# Patient Record
Sex: Male | Born: 1955
Health system: Southern US, Community
[De-identification: ages and names within clinical notes are randomized; demographics above are authoritative.]

## PROBLEM LIST (undated history)

## (undated) DIAGNOSIS — I10 Essential (primary) hypertension: Secondary | ICD-10-CM

## (undated) DIAGNOSIS — F32A Depression, unspecified: Secondary | ICD-10-CM

## (undated) DIAGNOSIS — M797 Fibromyalgia: Secondary | ICD-10-CM

## (undated) DIAGNOSIS — N401 Enlarged prostate with lower urinary tract symptoms: Secondary | ICD-10-CM

## (undated) DIAGNOSIS — E119 Type 2 diabetes mellitus without complications: Secondary | ICD-10-CM

## (undated) DIAGNOSIS — F329 Major depressive disorder, single episode, unspecified: Secondary | ICD-10-CM

## (undated) DIAGNOSIS — E669 Obesity, unspecified: Secondary | ICD-10-CM

## (undated) DIAGNOSIS — E785 Hyperlipidemia, unspecified: Secondary | ICD-10-CM

## (undated) DIAGNOSIS — J449 Chronic obstructive pulmonary disease, unspecified: Secondary | ICD-10-CM

## (undated) DIAGNOSIS — R3915 Urgency of urination: Secondary | ICD-10-CM

## (undated) DIAGNOSIS — M1990 Unspecified osteoarthritis, unspecified site: Secondary | ICD-10-CM

## (undated) HISTORY — DX: Essential (primary) hypertension: I10

## (undated) HISTORY — DX: Obesity, unspecified: E66.9

## (undated) HISTORY — DX: Chronic obstructive pulmonary disease, unspecified: J44.9

## (undated) HISTORY — DX: Unspecified osteoarthritis, unspecified site: M19.90

## (undated) HISTORY — DX: Major depressive disorder, single episode, unspecified: F32.9

## (undated) HISTORY — DX: Benign prostatic hyperplasia with lower urinary tract symptoms: N40.1

## (undated) HISTORY — DX: Hyperlipidemia, unspecified: E78.5

## (undated) HISTORY — DX: Depression, unspecified: F32.A

## (undated) HISTORY — DX: Urgency of urination: R39.15

---

## 2007-09-17 ENCOUNTER — Emergency Department: Payer: Self-pay | Admitting: Unknown Physician Specialty

## 2008-02-27 ENCOUNTER — Other Ambulatory Visit: Payer: Self-pay

## 2008-02-27 ENCOUNTER — Emergency Department: Payer: Self-pay | Admitting: Emergency Medicine

## 2008-07-28 ENCOUNTER — Emergency Department: Payer: Self-pay | Admitting: Emergency Medicine

## 2010-07-28 ENCOUNTER — Ambulatory Visit: Payer: Self-pay

## 2011-10-18 ENCOUNTER — Emergency Department: Payer: Self-pay | Admitting: Internal Medicine

## 2012-08-16 ENCOUNTER — Encounter (HOSPITAL_COMMUNITY): Payer: Self-pay | Admitting: Emergency Medicine

## 2012-08-16 ENCOUNTER — Emergency Department (HOSPITAL_COMMUNITY)
Admission: EM | Admit: 2012-08-16 | Discharge: 2012-08-16 | Disposition: A | Discharge status: Home / Self Care | Payer: PRIVATE HEALTH INSURANCE | Attending: Emergency Medicine | Admitting: Emergency Medicine

## 2012-08-16 ENCOUNTER — Emergency Department (HOSPITAL_COMMUNITY): Payer: PRIVATE HEALTH INSURANCE

## 2012-08-16 DIAGNOSIS — Z79899 Other long term (current) drug therapy: Secondary | ICD-10-CM | POA: Insufficient documentation

## 2012-08-16 DIAGNOSIS — Y990 Civilian activity done for income or pay: Secondary | ICD-10-CM | POA: Insufficient documentation

## 2012-08-16 DIAGNOSIS — Z7982 Long term (current) use of aspirin: Secondary | ICD-10-CM | POA: Insufficient documentation

## 2012-08-16 DIAGNOSIS — E119 Type 2 diabetes mellitus without complications: Secondary | ICD-10-CM | POA: Insufficient documentation

## 2012-08-16 DIAGNOSIS — R296 Repeated falls: Secondary | ICD-10-CM | POA: Insufficient documentation

## 2012-08-16 DIAGNOSIS — Y9210 Unspecified residential institution as the place of occurrence of the external cause: Secondary | ICD-10-CM | POA: Insufficient documentation

## 2012-08-16 DIAGNOSIS — N429 Disorder of prostate, unspecified: Secondary | ICD-10-CM | POA: Insufficient documentation

## 2012-08-16 DIAGNOSIS — S8002XA Contusion of left knee, initial encounter: Secondary | ICD-10-CM

## 2012-08-16 DIAGNOSIS — F17200 Nicotine dependence, unspecified, uncomplicated: Secondary | ICD-10-CM | POA: Insufficient documentation

## 2012-08-16 DIAGNOSIS — Z8739 Personal history of other diseases of the musculoskeletal system and connective tissue: Secondary | ICD-10-CM | POA: Insufficient documentation

## 2012-08-16 DIAGNOSIS — S8000XA Contusion of unspecified knee, initial encounter: Secondary | ICD-10-CM | POA: Insufficient documentation

## 2012-08-16 DIAGNOSIS — Y9389 Activity, other specified: Secondary | ICD-10-CM | POA: Insufficient documentation

## 2012-08-16 DIAGNOSIS — I10 Essential (primary) hypertension: Secondary | ICD-10-CM | POA: Insufficient documentation

## 2012-08-16 HISTORY — DX: Essential (primary) hypertension: I10

## 2012-08-16 HISTORY — DX: Type 2 diabetes mellitus without complications: E11.9

## 2012-08-16 HISTORY — DX: Fibromyalgia: M79.7

## 2012-08-16 MED ORDER — IBUPROFEN 800 MG PO TABS
800.0000 mg | ORAL_TABLET | Freq: Once | ORAL | Status: AC
  Administered 2012-08-16: 800 mg via ORAL
  Filled 2012-08-16: qty 1

## 2012-08-16 MED ORDER — HYDROCODONE-ACETAMINOPHEN 5-325 MG PO TABS: 1.0000 | ORAL_TABLET | ORAL | Status: DC | PRN

## 2012-08-16 MED ORDER — IBUPROFEN 800 MG PO TABS: 800.0000 mg | ORAL_TABLET | Freq: Three times a day (TID) | ORAL | Status: DC

## 2012-08-16 MED ORDER — HYDROCODONE-ACETAMINOPHEN 5-325 MG PO TABS
1.0000 | ORAL_TABLET | Freq: Once | ORAL | Status: DC
  Filled 2012-08-16: qty 1

## 2012-08-16 NOTE — ED Provider Notes (Signed)
History     CSN: [id number]  Arrival date & time 08/16/12  0424   First MD Initiated Contact with Patient 08/16/12 0455      Chief Complaint  Patient presents with  . Knee Injury    (Consider location/radiation/quality/duration/timing/severity/associated sxs/prior treatment) HPI Jonathon Graham is a 57 y.o. male , with a h/o DM, fibromyalgia, HTN who presents to the Emergency Department complaining of left knee pain secondary to a fall taken the night of 08/15/12 in the ER onto his knees. He was given an ice pack which helped until tonight when with walking he developed more pain.  Pain with walking and with bending the knee. Past Medical History  Diagnosis Date  . Hypertension   . Fibromyalgia, primary   . Diabetes mellitus without complication   . Prostate disorder     History reviewed. No pertinent past surgical history.  History reviewed. No pertinent family history.  History  Substance Use Topics  . Smoking status: Current Every Day Smoker  . Smokeless tobacco: Not on file  . Alcohol Use: No      Review of Systems  Constitutional: Negative for fever.       10 Systems reviewed and are negative for acute change except as noted in the HPI.  HENT: Negative for congestion.   Eyes: Negative for discharge and redness.  Respiratory: Negative for cough and shortness of breath.   Cardiovascular: Negative for chest pain.  Gastrointestinal: Negative for vomiting and abdominal pain.  Musculoskeletal: Negative for back pain.       Left Knee pain  Skin: Negative for rash.  Neurological: Negative for syncope, numbness and headaches.  Psychiatric/Behavioral:       No behavior change.    Allergies  Penicillins  Home Medications   Current Outpatient Rx  Name  Route  Sig  Dispense  Refill  . aspirin 81 MG tablet   Oral   Take 81 mg by mouth daily. Takes 2 - 81mg  aspirin daily.         Marland Kitchen doxazosin (CARDURA) 8 MG tablet   Oral   Take 8 mg by mouth at bedtime.          . hydrochlorothiazide (HYDRODIURIL) 25 MG tablet   Oral   Take 25 mg by mouth daily.         . metFORMIN (GLUCOPHAGE) 500 MG tablet   Oral   Take 500 mg by mouth 2 (two) times daily with a meal.           BP 124/100  Pulse 76  Temp(Src) 97.6 F (36.4 C) (Oral)  Resp 20  SpO2 92%  Physical Exam  Nursing note and vitals reviewed. Constitutional:  Awake, alert, nontoxic appearance.  HENT:  Head: Atraumatic.  Eyes: Right eye exhibits no discharge. Left eye exhibits no discharge.  Neck: Neck supple.  Pulmonary/Chest: Effort normal. He exhibits no tenderness.  Abdominal: Soft. There is no tenderness. There is no rebound.  Musculoskeletal: He exhibits no tenderness.  Baseline ROM, no obvious new focal weakness.Mild swelling to the left knee. Painful along the medial aspect of the joint line to palpation. No crepitus, no patellar ballottement.   Neurological:  Mental status and motor strength appears baseline for patient and situation.  Skin: No rash noted.  Psychiatric: He has a normal mood and affect.    ED Course  Procedures (including critical care time)  Dg Knee Complete 4 Views Left  08/16/2012  *RADIOLOGY REPORT*  Clinical Data: Knee pain after  fall on 08/15/2011.  LEFT KNEE - COMPLETE 4+ VIEW  Comparison: None.  Findings: Mild soft tissue swelling over the infrapatellar ligament.  Left knee appears otherwise intact.  No evidence of acute fracture or subluxation.  No significant effusion.  No focal bone lesion or bone destruction.  Bone cortex and trabecular architecture appear intact.  No abnormal radiopaque soft tissue foreign bodies.  IMPRESSION: Soft tissue swelling over the infrapatellar ligament.  This could be due to hematoma or bursitis.  No acute bony abnormalities.   Original Report Authenticated By: Burman Nieves, M.D.     MDM  Patient with knee swelling and pain s/p fall. Xrays without acute findings except soft tissue swelling. ACE wrap applied.  Ibuprofen and hydrocodone given. Reviewed results with patient. Pt stable in ED with no significant deterioration in condition.The patient appears reasonably screened and/or stabilized for discharge and I doubt any other medical condition or other [facility] requiring further screening, evaluation, or treatment in the ED at this time prior to discharge.  MDM Reviewed: nursing note and vitals Interpretation: x-ray           Nicoletta Dress. Colon Branch, MD 08/16/12 [phone]

## 2012-08-16 NOTE — ED Notes (Signed)
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 Complaining of pain and swelling in left knee.

## 2013-04-25 ENCOUNTER — Encounter: Payer: Self-pay | Admitting: Internal Medicine

## 2013-04-25 ENCOUNTER — Ambulatory Visit: Payer: Self-pay | Admitting: Family Medicine

## 2013-06-25 ENCOUNTER — Emergency Department: Payer: Self-pay | Admitting: Emergency Medicine

## 2013-06-25 LAB — CBC
HCT: 41.5 % (ref 40.0–52.0)
HGB: 13.8 g/dL (ref 13.0–18.0)
MCH: 28.9 pg (ref 26.0–34.0)
MCHC: 33.2 g/dL (ref 32.0–36.0)
MCV: 87 fL (ref 80–100)
Platelet: 187 10*3/uL (ref 150–440)
RBC: 4.76 10*6/uL (ref 4.40–5.90)
RDW: 15.9 % — ABNORMAL HIGH (ref 11.5–14.5)
WBC: 6.3 10*3/uL (ref 3.8–10.6)

## 2013-06-25 LAB — BASIC METABOLIC PANEL
Anion Gap: 4 — ABNORMAL LOW (ref 7–16)
BUN: 18 mg/dL (ref 7–18)
Calcium, Total: 9.1 mg/dL (ref 8.5–10.1)
Chloride: 103 mmol/L (ref 98–107)
Co2: 29 mmol/L (ref 21–32)
Creatinine: 0.83 mg/dL (ref 0.60–1.30)
EGFR (African American): >60
EGFR (Non-African Amer.): >60
Glucose: 117 mg/dL — ABNORMAL HIGH (ref 65–99)
Osmolality: 275 (ref 275–301)
Potassium: 3.7 mmol/L (ref 3.5–5.1)
Sodium: 136 mmol/L (ref 136–145)

## 2013-06-25 LAB — TROPONIN I: Troponin-I: <0.02 ng/mL

## 2013-06-25 LAB — PRO B NATRIURETIC PEPTIDE: B-Type Natriuretic Peptide: 69 pg/mL (ref 0–125)

## 2013-07-03 ENCOUNTER — Emergency Department: Payer: Self-pay | Admitting: Emergency Medicine

## 2013-07-03 LAB — COMPREHENSIVE METABOLIC PANEL
Albumin: 3.7 g/dL (ref 3.4–5.0)
Alkaline Phosphatase: 51 U/L
Anion Gap: 4 — ABNORMAL LOW (ref 7–16)
BUN: 18 mg/dL (ref 7–18)
Bilirubin,Total: 0.9 mg/dL (ref 0.2–1.0)
Calcium, Total: 8.8 mg/dL (ref 8.5–10.1)
Chloride: 99 mmol/L (ref 98–107)
Co2: 33 mmol/L — ABNORMAL HIGH (ref 21–32)
Creatinine: 0.81 mg/dL (ref 0.60–1.30)
EGFR (African American): >60
EGFR (Non-African Amer.): >60
Glucose: 128 mg/dL — ABNORMAL HIGH (ref 65–99)
Osmolality: 275 (ref 275–301)
Potassium: 4 mmol/L (ref 3.5–5.1)
SGOT(AST): 30 U/L (ref 15–37)
SGPT (ALT): 48 U/L (ref 12–78)
Sodium: 136 mmol/L (ref 136–145)
Total Protein: 8 g/dL (ref 6.4–8.2)

## 2013-07-03 LAB — CBC WITH DIFFERENTIAL/PLATELET
Basophil #: 0 10*3/uL (ref 0.0–0.1)
Basophil %: 0.4 %
Eosinophil #: 0.1 10*3/uL (ref 0.0–0.7)
Eosinophil %: 1.8 %
HCT: 42.3 % (ref 40.0–52.0)
HGB: 14.3 g/dL (ref 13.0–18.0)
Lymphocyte #: 2 10*3/uL (ref 1.0–3.6)
Lymphocyte %: 27.8 %
MCH: 28.8 pg (ref 26.0–34.0)
MCHC: 33.7 g/dL (ref 32.0–36.0)
MCV: 85 fL (ref 80–100)
Monocyte #: 1.1 x10 3/mm — ABNORMAL HIGH (ref 0.2–1.0)
Monocyte %: 15.7 %
Neutrophil #: 3.9 10*3/uL (ref 1.4–6.5)
Neutrophil %: 54.3 %
Platelet: 170 10*3/uL (ref 150–440)
RBC: 4.95 10*6/uL (ref 4.40–5.90)
RDW: 15.7 % — ABNORMAL HIGH (ref 11.5–14.5)
WBC: 7.2 10*3/uL (ref 3.8–10.6)

## 2013-08-19 ENCOUNTER — Other Ambulatory Visit: Payer: Self-pay | Admitting: Family Medicine

## 2013-08-19 LAB — CBC WITH DIFFERENTIAL/PLATELET
Basophil #: 0 10*3/uL (ref 0.0–0.1)
Basophil %: 0.2 %
Eosinophil #: 0.1 10*3/uL (ref 0.0–0.7)
Eosinophil %: 2.2 %
HCT: 43.7 % (ref 40.0–52.0)
HGB: 14.4 g/dL (ref 13.0–18.0)
Lymphocyte #: 1.5 10*3/uL (ref 1.0–3.6)
Lymphocyte %: 24.3 %
MCH: 28.6 pg (ref 26.0–34.0)
MCHC: 32.9 g/dL (ref 32.0–36.0)
MCV: 87 fL (ref 80–100)
Monocyte #: 0.5 x10 3/mm (ref 0.2–1.0)
Monocyte %: 8.2 %
Neutrophil #: 4.1 10*3/uL (ref 1.4–6.5)
Neutrophil %: 65.1 %
Platelet: 196 10*3/uL (ref 150–440)
RBC: 5.03 10*6/uL (ref 4.40–5.90)
RDW: 16.1 % — ABNORMAL HIGH (ref 11.5–14.5)
WBC: 6.3 10*3/uL (ref 3.8–10.6)

## 2013-08-19 LAB — PROTEIN, URINE, RANDOM: Protein, Random Urine: 15 mg/dL — ABNORMAL HIGH (ref 0–12)

## 2013-08-19 LAB — PROTEIN, TOTAL: Total Protein: 7.6 g/dL (ref 6.4–8.2)

## 2013-12-17 ENCOUNTER — Other Ambulatory Visit: Payer: Self-pay | Admitting: Physical Medicine and Rehabilitation

## 2013-12-17 DIAGNOSIS — M542 Cervicalgia: Secondary | ICD-10-CM

## 2013-12-17 DIAGNOSIS — G894 Chronic pain syndrome: Secondary | ICD-10-CM

## 2013-12-17 DIAGNOSIS — M5412 Radiculopathy, cervical region: Secondary | ICD-10-CM

## 2013-12-17 DIAGNOSIS — M5030 Other cervical disc degeneration, unspecified cervical region: Secondary | ICD-10-CM

## 2013-12-17 DIAGNOSIS — M4802 Spinal stenosis, cervical region: Secondary | ICD-10-CM

## 2013-12-23 ENCOUNTER — Other Ambulatory Visit: Payer: Self-pay | Admitting: Physical Medicine and Rehabilitation

## 2013-12-24 ENCOUNTER — Other Ambulatory Visit: Payer: Self-pay | Admitting: Physical Medicine and Rehabilitation

## 2013-12-24 DIAGNOSIS — G894 Chronic pain syndrome: Secondary | ICD-10-CM

## 2013-12-24 DIAGNOSIS — M542 Cervicalgia: Secondary | ICD-10-CM

## 2013-12-24 DIAGNOSIS — M4802 Spinal stenosis, cervical region: Secondary | ICD-10-CM

## 2013-12-24 DIAGNOSIS — M5030 Other cervical disc degeneration, unspecified cervical region: Secondary | ICD-10-CM

## 2013-12-24 DIAGNOSIS — M5412 Radiculopathy, cervical region: Secondary | ICD-10-CM

## 2013-12-26 ENCOUNTER — Other Ambulatory Visit: Payer: Self-pay

## 2014-08-13 ENCOUNTER — Ambulatory Visit: Payer: Self-pay | Admitting: Family Medicine

## 2015-01-06 ENCOUNTER — Telehealth: Payer: Self-pay | Admitting: Family Medicine

## 2015-01-06 NOTE — Telephone Encounter (Signed)
Pt needs refills on metformin 1000 mg and tamsulosin 4 mg sent to [facility] [address]  He has appt scheduled for Monday 15th.   His number is [phone]

## 2015-01-07 MED ORDER — TAMSULOSIN HCL 0.4 MG PO CAPS: 0.4000 mg | ORAL_CAPSULE | Freq: Every day | ORAL | Status: DC

## 2015-01-07 MED ORDER — METFORMIN HCL 1000 MG PO TABS
1000.0000 mg | ORAL_TABLET | Freq: Two times a day (BID) | ORAL | Status: DC
Start: 2015-01-07 — End: 2015-11-26

## 2015-01-07 NOTE — Telephone Encounter (Signed)
Refills sent to [facility] per patient request.

## 2015-01-11 ENCOUNTER — Ambulatory Visit: Payer: Self-pay | Admitting: Family Medicine

## 2015-01-13 ENCOUNTER — Ambulatory Visit: Payer: Self-pay | Admitting: Family Medicine

## 2015-01-28 ENCOUNTER — Encounter: Payer: Self-pay | Admitting: *Deleted

## 2015-01-29 ENCOUNTER — Ambulatory Visit: Payer: BC Managed Care – PPO | Admitting: Family Medicine

## 2015-02-05 ENCOUNTER — Encounter: Payer: Self-pay | Admitting: Emergency Medicine

## 2015-02-05 ENCOUNTER — Emergency Department: Payer: Self-pay

## 2015-02-05 ENCOUNTER — Emergency Department
Admission: EM | Admit: 2015-02-05 | Discharge: 2015-02-05 | Disposition: A | Discharge status: Home / Self Care | Payer: Self-pay | Attending: Emergency Medicine | Admitting: Emergency Medicine

## 2015-02-05 DIAGNOSIS — Z720 Tobacco use: Secondary | ICD-10-CM | POA: Insufficient documentation

## 2015-02-05 DIAGNOSIS — Z79899 Other long term (current) drug therapy: Secondary | ICD-10-CM | POA: Insufficient documentation

## 2015-02-05 DIAGNOSIS — Z880 Allergy status to penicillin: Secondary | ICD-10-CM | POA: Insufficient documentation

## 2015-02-05 DIAGNOSIS — E119 Type 2 diabetes mellitus without complications: Secondary | ICD-10-CM | POA: Insufficient documentation

## 2015-02-05 DIAGNOSIS — Z7982 Long term (current) use of aspirin: Secondary | ICD-10-CM | POA: Insufficient documentation

## 2015-02-05 DIAGNOSIS — I10 Essential (primary) hypertension: Secondary | ICD-10-CM | POA: Insufficient documentation

## 2015-02-05 DIAGNOSIS — M545 Low back pain, unspecified: Secondary | ICD-10-CM

## 2015-02-05 LAB — URINALYSIS COMPLETE WITH MICROSCOPIC (ARMC ONLY)
Bacteria, UA: NONE SEEN
Bilirubin Urine: NEGATIVE
Glucose, UA: NEGATIVE mg/dL
Hgb urine dipstick: NEGATIVE
Ketones, ur: NEGATIVE mg/dL
Leukocytes, UA: NEGATIVE
Nitrite: NEGATIVE
Protein, ur: NEGATIVE mg/dL
Specific Gravity, Urine: 1.026 (ref 1.005–1.030)
pH: 6 (ref 5.0–8.0)

## 2015-02-05 MED ORDER — ETODOLAC 400 MG PO TABS: 400.0000 mg | ORAL_TABLET | Freq: Two times a day (BID) | ORAL | Status: DC

## 2015-02-05 NOTE — ED Notes (Signed)
Developed some lower back pains few days ago. Noticed that pain became worse after making a delivery .states pain is lower back and slightly into bilateral hip areas. Increased pain with standing

## 2015-02-05 NOTE — ED Notes (Signed)
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 Pt reports pain across his back non radiating

## 2015-02-05 NOTE — ED Provider Notes (Signed)
[facility] Emergency Department Provider Note  ____________________________________________  Time seen: Approximately 10:12 AM  I have reviewed the triage vital signs and the nursing notes.   HISTORY  Chief Complaint Back Pain   HPI Jonathon Graham is a 59 y.o. male is here with complaint of back pain for 2 days. 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 He states that he did not have any pain initially but once he got home began having pain and stiffness. He also has not taken any medication over-the-counter for his back pain. He states pain is increased with standing. pain is nonradiating in nature.Currently his pain is 10 over 10. He denies any urinary frequency however his wife states the areas. He denies any history of UTIs or kidney stones. He denies any loss of bowel or bladder control. Pain is increased with range of motion.   Past Medical History  Diagnosis Date  . Hypertension   . Fibromyalgia, primary   . Diabetes mellitus without complication   . Prostate disorder   . Benign prostatic hyperplasia (BPH) with urinary urgency     nodular  . Depression   . Arthritis   . Essential hypertension     There are no active problems to display for this patient.   History reviewed. No pertinent past surgical history.  Current Outpatient Rx  Name  Route  Sig  Dispense  Refill  . aspirin 81 MG tablet   Oral   Take 81 mg by mouth daily. Takes 2 - 81mg  aspirin daily.         Marland Kitchen doxazosin (CARDURA) 8 MG tablet   Oral   Take 8 mg by mouth at bedtime.         Marland Kitchen etodolac (LODINE) 400 MG tablet   Oral   Take 1 tablet (400 mg total) by mouth 2 (two) times daily.   20 tablet   0   . hydrochlorothiazide (HYDRODIURIL) 25 MG tablet   Oral   Take 25 mg by mouth daily.         Marland Kitchen HYDROcodone-acetaminophen (NORCO/VICODIN) 5-325 MG per tablet   Oral   Take 1 tablet by mouth every 4 (four) hours as needed for pain.   15 tablet   0    . ibuprofen (ADVIL,MOTRIN) 800 MG tablet   Oral   Take 1 tablet (800 mg total) by mouth 3 (three) times daily.   21 tablet   0   . metFORMIN (GLUCOPHAGE) 1000 MG tablet   Oral   Take 1 tablet (1,000 mg total) by mouth 2 (two) times daily with a meal.   180 tablet   3   . tamsulosin (FLOMAX) 0.4 MG CAPS capsule   Oral   Take 1 capsule (0.4 mg total) by mouth daily.   90 capsule   3     Allergies Penicillins  Family History  Problem Relation Age of Onset  . Heart disease Father   . Diabetes Mellitus II Sister   . Myasthenia gravis Sister   . Diabetes Mellitus II Brother     Social History Social History  Substance Use Topics  . Smoking status: Current Every Day Smoker  . Smokeless tobacco: None  . Alcohol Use: No    Review of Systems Constitutional: No fever/chills Eyes: No visual changes. ENT: No sore throat. Cardiovascular: Denies chest pain. Respiratory: Denies shortness of breath. Gastrointestinal: No abdominal pain.  No nausea, no vomiting.  No diarrhea.  No constipation. Genitourinary: Negative  for dysuria. Musculoskeletal: Positive for back pain. Skin: Negative for rash. Neurological: Negative for headaches, focal weakness or numbness.  10-point ROS otherwise negative.  ____________________________________________   PHYSICAL EXAM:  VITAL SIGNS: ED Triage Vitals  Enc Vitals Group     BP 02/05/15 0929 143/79 mmHg     Pulse Rate 02/05/15 0929 69     Resp 02/05/15 0929 18     Temp 02/05/15 0929 98.5 F (36.9 C)     Temp src --      SpO2 02/05/15 0929 92 %     Weight 02/05/15 0943 330 lb (149.687 kg)     Height 02/05/15 0943  (1.702 m)     Head Cir --      Peak Flow --      Pain Score 02/05/15 0930 10     Pain Loc --      Pain Edu? --      Excl. in GC? --     Constitutional: Alert and oriented. Well appearing and in no acute distress. Patient  is morbidly obese Eyes: Conjunctivae are normal. PERRL. EOMI. Head: Atraumatic. Nose:  No congestion/rhinnorhea. Mouth/Throat: Mucous membranes are moist.  Oropharynx non-erythematous. Neck: No stridor.   Hematological/Lymphatic/Immunilogical: No cervical lymphadenopathy. Cardiovascular: Normal rate, regular rhythm. Grossly normal heart sounds.  Good peripheral circulation. Respiratory: Normal respiratory effort.  No retractions. Lungs CTAB. Gastrointestinal: Soft and nontender,  obese No distention. No abdominal bruits. No CVA tenderness. Musculoskeletal: Examination of the back there is no gross deformity. There is no tenderness on palpation of the lumbosacral spine but more bilateral paravertebral muscles. Range of motion is slightly restricted secondary to pain. No active muscle spasms were seen. Patient has good strength bilaterally in his legs. Straight leg raises were approximately 45 with discomfort in his lower back. No lower extremity tenderness nor edema.  No joint effusions. Neurologic:  Normal speech and language. No gross focal neurologic deficits are appreciated. No gait instability. Reflexes were 1+ bilaterally Skin:  Skin is warm, dry and intact. No rash noted. Psychiatric: Mood and affect are normal. Speech and behavior are normal.  ____________________________________________   LABS (all labs ordered are listed, but only abnormal results are displayed)  Labs Reviewed  URINALYSIS COMPLETEWITH MICROSCOPIC ([facility] ONLY) - Abnormal; Notable for the following:    Color, Urine YELLOW (*)    APPearance CLEAR (*)    Squamous Epithelial / LPF 0-5 (*)    All other components within normal limits     RADIOLOGY  Lumbar spine x-ray per radiologist no acute fracture. There is a 1 mm anterolisthesis of L4 on L5 secondary to bilateral facet arthropathy.  L5-S1 bilateral facet arthropathy  ____________________________________________   PROCEDURES  Procedure(s) performed: None  Critical Care performed:  No  ____________________________________________   INITIAL IMPRESSION / ASSESSMENT AND PLAN / ED COURSE  Pertinent labs & imaging results that were available during my care of the patient were reviewed by me and considered in my medical decision making (see chart for details).  Patient was informed that urinalysis was completely normal and the results of his back x-ray. Patient refused Toradol injection while in the emergency room. 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 Prescription was written for etodolac 400 mg twice a day with food. He is to follow-up with his primary care doctor if any continued problems or Dr. Martha Clan ____________________________________________   FINAL CLINICAL IMPRESSION(S) / ED DIAGNOSES  Final diagnoses:  Bilateral  low back pain without sciatica      Tommi Rumps, PA-C 02/05/15 1214  Sharyn Creamer, MD 02/06/15 [phone]

## 2015-03-16 ENCOUNTER — Telehealth: Payer: Self-pay

## 2015-03-16 ENCOUNTER — Ambulatory Visit
Admission: RE | Admit: 2015-03-16 | Discharge: 2015-03-16 | Disposition: A | Discharge status: Home / Self Care | Payer: BC Managed Care – PPO | Source: Ambulatory Visit | Attending: Family Medicine | Admitting: Family Medicine

## 2015-03-16 ENCOUNTER — Ambulatory Visit (INDEPENDENT_AMBULATORY_CARE_PROVIDER_SITE_OTHER): Payer: BC Managed Care – PPO | Admitting: Family Medicine

## 2015-03-16 ENCOUNTER — Encounter: Payer: Self-pay | Admitting: Family Medicine

## 2015-03-16 VITALS — BP 113/74 | HR 87 | Temp 98.7°F | Resp 16 | Ht 67.0 in | Wt 347.0 lb

## 2015-03-16 DIAGNOSIS — M79604 Pain in right leg: Secondary | ICD-10-CM

## 2015-03-16 DIAGNOSIS — R600 Localized edema: Secondary | ICD-10-CM

## 2015-03-16 DIAGNOSIS — M79601 Pain in right arm: Secondary | ICD-10-CM | POA: Diagnosis not present

## 2015-03-16 DIAGNOSIS — M79605 Pain in left leg: Secondary | ICD-10-CM | POA: Insufficient documentation

## 2015-03-16 DIAGNOSIS — M7989 Other specified soft tissue disorders: Secondary | ICD-10-CM | POA: Diagnosis present

## 2015-03-16 DIAGNOSIS — M25551 Pain in right hip: Secondary | ICD-10-CM | POA: Insufficient documentation

## 2015-03-16 DIAGNOSIS — M79606 Pain in leg, unspecified: Secondary | ICD-10-CM | POA: Insufficient documentation

## 2015-03-16 DIAGNOSIS — M25552 Pain in left hip: Secondary | ICD-10-CM | POA: Insufficient documentation

## 2015-03-16 NOTE — Addendum Note (Signed)
Addended by: Venora MaplesHAWKINS, Sindy Mccune on: 03/16/2015 12:01 PM   Modules accepted: Orders

## 2015-03-16 NOTE — Progress Notes (Signed)
Name: Jonathon Graham   MRN: [id number]    DOB: [birth year]   Date:03/16/2015       Progress Note  Subjective  Chief Complaint  Chief Complaint  Patient presents with  . Back Pain    went to ER seen orthopedic diagnosed with arthritis also retaining water in both ankle onset 3 days ago     HPI  Here c/o swelling in legs ands feet.  Present for 4 days.  Painful.  Goes down at night.  Worse with standing.  Has had vascular evaluation aout 1 yr. Ago.  No clot.  He had cellulitgs at that time.   Went to ER 2-3 weeks ago due to pain in low back.  "couldn't move"  Dx'd with arthritis of back.  PT recommended ut he declined due to cost.  Saw Dr. Neomia GlassKazinski at [facility].  Treated with muscle relaxants.  Back is better now, but has some pain on and off.   Has not seen Amy since January for his DM follow up. No problem-specific assessment & plan notes found for this encounter.   Past Medical History  Diagnosis Date  . Hypertension   . Fibromyalgia, primary   . Diabetes mellitus without complication (HCC)   . Prostate disorder   . Benign prostatic hyperplasia (BPH) with urinary urgency     nodular  . Depression   . Arthritis   . Essential hypertension     Social History  Substance Use Topics  . Smoking status: Current Every Day Smoker    Types: Cigarettes  . Smokeless tobacco: Not on file     Comment: 2 packs a week  . Alcohol Use: No     Current outpatient prescriptions:  .  aspirin 81 MG tablet, Take 81 mg by mouth daily. Takes 2 - 81mg  aspirin daily., Disp: , Rfl:  .  hydrochlorothiazide (HYDRODIURIL) 25 MG tablet, Take 25 mg by mouth daily., Disp: , Rfl:  .  metFORMIN (GLUCOPHAGE) 1000 MG tablet, Take 1 tablet (1,000 mg total) by mouth 2 (two) times daily with a meal., Disp: 180 tablet, Rfl: 3 .  tamsulosin (FLOMAX) 0.4 MG CAPS capsule, Take 0.4 mg by mouth., Disp: , Rfl:  .  ibuprofen (ADVIL,MOTRIN) 800 MG tablet, Take 1 tablet (800 mg total) by mouth 3 (three) times daily.  (Patient not taking: Reported on 03/16/2015), Disp: 21 tablet, Rfl: 0  Allergies  Allergen Reactions  . Penicillins Anaphylaxis    Review of Systems  Constitutional: Negative for fever, chills, weight loss and malaise/fatigue.  Eyes: Negative for blurred vision and double vision.  Respiratory: Negative for sputum production, shortness of breath and wheezing.   Cardiovascular: Positive for orthopnea (occ.) and leg swelling. Negative for chest pain and palpitations.  Gastrointestinal: Negative for heartburn, abdominal pain and blood in stool.  Genitourinary: Positive for frequency. Negative for dysuria and urgency.  Musculoskeletal: Positive for back pain.  Neurological: Negative for dizziness, tremors and weakness.      Objective  Filed Vitals:   03/16/15 1114  BP: 113/74  Pulse: 87  Temp: 98.7 F (37.1 C)  TempSrc: Oral  Resp: 16  Height: 5\' 7"  (1.702 m)  Weight: 347 lb (157.398 kg)     Physical Exam  Constitutional: He is oriented to person, place, and time and well-developed, well-nourished, and in no distress. No distress.  HENT:  Head: Normocephalic and atraumatic.  Eyes: Conjunctivae and EOM are normal. Pupils are equal, round, and reactive to light.  Neck: Normal range of  motion. Neck supple. Carotid bruit is not present. No thyromegaly present.  Cardiovascular: Normal rate, regular rhythm and normal heart sounds.  Exam reveals no gallop and no friction rub.   No murmur heard. Pulmonary/Chest: Effort normal and breath sounds normal. No respiratory distress. He has no wheezes. He has no rales.  Musculoskeletal: He exhibits edema (1+ edema to mid to upper ower leg, R>L.  + Homan's sign on R. with calf tenderness.).  Lymphadenopathy:    He has no cervical adenopathy.  Neurological: He is alert and oriented to person, place, and time.  Vitals reviewed.     Recent Results (from the past 2160 hour(s))  Urinalysis complete, with microscopic ([facility] only)     Status:  Abnormal   Collection Time: 02/05/15 10:56 AM  Result Value Ref Range   Color, Urine YELLOW (A) YELLOW   APPearance CLEAR (A) CLEAR   Glucose, UA NEGATIVE NEGATIVE mg/dL   Bilirubin Urine NEGATIVE NEGATIVE   Ketones, ur NEGATIVE NEGATIVE mg/dL   Specific Gravity, Urine 1.026 1.005 - 1.030   Hgb urine dipstick NEGATIVE NEGATIVE   pH 6.0 5.0 - 8.0   Protein, ur NEGATIVE NEGATIVE mg/dL   Nitrite NEGATIVE NEGATIVE   Leukocytes, UA NEGATIVE NEGATIVE   RBC / HPF 0-5 0 - 5 RBC/hpf   WBC, UA 0-5 0 - 5 WBC/hpf   Bacteria, UA NONE SEEN NONE SEEN   Squamous Epithelial / LPF 0-5 (A) NONE SEEN   Mucous PRESENT    Amorphous Crystal PRESENT      Assessment & Plan  1. Pedal edema  - US Venous Img Lower Bilateral; Future  2. Leg pain, posterior, right  - US Venous Img Lower Bilateral; Future

## 2015-03-16 NOTE — Telephone Encounter (Signed)
CALL REPORT: Imaging today at [facility] Negative for DVT Patient has been sent home and will wait for us to call with results. Radiology called in this.

## 2015-03-16 NOTE — Telephone Encounter (Signed)
Call patient and tell him no clot found.  Elevate legs as much as possible.   He needs to get some knee high support socks and wear them every day from Getting up until he goes to bed.  Will discuss different meds. When I see him next week.-jh

## 2015-03-17 NOTE — Telephone Encounter (Signed)
Advised.JH  

## 2015-03-23 ENCOUNTER — Ambulatory Visit: Payer: BC Managed Care – PPO | Admitting: Family Medicine

## 2015-04-07 ENCOUNTER — Telehealth: Payer: Self-pay | Admitting: Family Medicine

## 2015-04-07 ENCOUNTER — Ambulatory Visit
Admission: RE | Admit: 2015-04-07 | Discharge: 2015-04-07 | Disposition: A | Discharge status: Home / Self Care | Payer: BC Managed Care – PPO | Source: Ambulatory Visit | Attending: Family Medicine | Admitting: Family Medicine

## 2015-04-07 ENCOUNTER — Other Ambulatory Visit: Payer: Self-pay | Admitting: Family Medicine

## 2015-04-07 ENCOUNTER — Ambulatory Visit (INDEPENDENT_AMBULATORY_CARE_PROVIDER_SITE_OTHER): Payer: BC Managed Care – PPO | Admitting: Family Medicine

## 2015-04-07 ENCOUNTER — Encounter: Payer: Self-pay | Admitting: Family Medicine

## 2015-04-07 VITALS — BP 132/86 | HR 97 | Temp 98.6°F | Resp 16 | Ht 67.0 in | Wt 347.0 lb

## 2015-04-07 DIAGNOSIS — R918 Other nonspecific abnormal finding of lung field: Secondary | ICD-10-CM | POA: Diagnosis not present

## 2015-04-07 DIAGNOSIS — J181 Lobar pneumonia, unspecified organism: Secondary | ICD-10-CM

## 2015-04-07 DIAGNOSIS — R739 Hyperglycemia, unspecified: Secondary | ICD-10-CM

## 2015-04-07 DIAGNOSIS — J189 Pneumonia, unspecified organism: Secondary | ICD-10-CM

## 2015-04-07 DIAGNOSIS — R0602 Shortness of breath: Secondary | ICD-10-CM | POA: Diagnosis present

## 2015-04-07 DIAGNOSIS — J111 Influenza due to unidentified influenza virus with other respiratory manifestations: Secondary | ICD-10-CM

## 2015-04-07 DIAGNOSIS — R69 Illness, unspecified: Principal | ICD-10-CM

## 2015-04-07 LAB — POCT INFLUENZA A/B
Influenza A, POC: NEGATIVE
Influenza B, POC: NEGATIVE

## 2015-04-07 MED ORDER — IPRATROPIUM-ALBUTEROL 0.5-2.5 (3) MG/3ML IN SOLN: 3.0000 mL | Freq: Four times a day (QID) | RESPIRATORY_TRACT | Status: DC

## 2015-04-07 MED ORDER — ALBUTEROL SULFATE (2.5 MG/3ML) 0.083% IN NEBU: 2.5000 mg | INHALATION_SOLUTION | Freq: Four times a day (QID) | RESPIRATORY_TRACT | Status: DC | PRN

## 2015-04-07 MED ORDER — AZITHROMYCIN 250 MG PO TABS: ORAL_TABLET | ORAL | Status: DC

## 2015-04-07 MED ORDER — OSELTAMIVIR PHOSPHATE 75 MG PO CAPS: 75.0000 mg | ORAL_CAPSULE | Freq: Two times a day (BID) | ORAL | Status: AC

## 2015-04-07 NOTE — Telephone Encounter (Signed)
Called and spoke with patient. The tamiflu was just to shorten and lessen symptoms. It may not be effective 48 hours after symptom onset. He can just not fill this medication.  Use tylenol or ibuprofen OTC to help with symptoms. Ideally symptoms will resolve in 7-10 days.

## 2015-04-07 NOTE — Progress Notes (Signed)
Subjective:    Patient ID: Jonathon Graham, male    DOB: 12-Jan-1956, 59 y.o.   MRN: [id number]  HPI: Jonathon PaulaKeith Flegel is a 59 y.o. male presenting on 04/07/2015 for Generalized Body Aches   HPI  Pt presents for shortness of breath, febrile illness, and general body aches. Symptoms began on Saturday. Low grade fevers at home. Had chills and general body aches. Trouble breathing began on Sunday. Pt is coughing up thick yellow phlegm. Some nasal drainage. Pt reports no history of asthma.   Past Medical History  Diagnosis Date  . Hypertension   . Fibromyalgia, primary   . Diabetes mellitus without complication (HCC)   . Prostate disorder   . Benign prostatic hyperplasia (BPH) with urinary urgency     nodular  . Depression   . Arthritis   . Essential hypertension     Current Outpatient Prescriptions on File Prior to Visit  Medication Sig  . aspirin 81 MG tablet Take 81 mg by mouth daily. Takes 2 - 81mg  aspirin daily.  . hydrochlorothiazide (HYDRODIURIL) 25 MG tablet Take 25 mg by mouth daily.  Marland Kitchen. ibuprofen (ADVIL,MOTRIN) 800 MG tablet Take 1 tablet (800 mg total) by mouth 3 (three) times daily.  . metFORMIN (GLUCOPHAGE) 1000 MG tablet Take 1 tablet (1,000 mg total) by mouth 2 (two) times daily with a meal.  . tamsulosin (FLOMAX) 0.4 MG CAPS capsule Take 0.4 mg by mouth.   No current facility-administered medications on file prior to visit.    Review of Systems  Constitutional: Positive for fever and chills.  HENT: Positive for rhinorrhea. Negative for congestion and sore throat.   Eyes: Negative.   Respiratory: Positive for chest tightness, shortness of breath and wheezing.   Cardiovascular: Negative for chest pain and leg swelling.  Gastrointestinal: Negative for nausea and vomiting.  Musculoskeletal: Positive for myalgias.  Neurological: Positive for weakness. Negative for dizziness, syncope and light-headedness.  Psychiatric/Behavioral: Negative.    Per HPI unless specifically  indicated above     Objective:    BP 132/86 mmHg  Pulse 97  Temp(Src) 98.6 F (37 C) (Oral)  Resp 16  Ht 5\' 7"  (1.702 m)  Wt 347 lb (157.398 kg)  BMI 54.34 kg/m2  SpO2 93%  Wt Readings from Last 3 Encounters:  04/07/15 347 lb (157.398 kg)  03/16/15 347 lb (157.398 kg)  02/05/15 330 lb (149.687 kg)    Physical Exam  Constitutional: He is oriented to person, place, and time. He appears well-developed and well-nourished. No distress.  HENT:  Head: Normocephalic and atraumatic.  Neck: Neck supple. No thyromegaly present.  Cardiovascular: Normal rate, regular rhythm and normal heart sounds.  Exam reveals no gallop and no friction rub.   No murmur heard. Pulmonary/Chest: Effort normal. Tachypnea noted. No respiratory distress. He has decreased breath sounds (prenebulizer- cleared after duoneb.) in the right lower field, the left middle field and the left lower field. He has wheezes in the right upper field, the right middle field, the right lower field, the left upper field, the left middle field and the left lower field. He has no rhonchi. He has no rales. Chest wall is dull to percussion (LLL). He exhibits no mass, no crepitus and no deformity.  Abdominal: Soft. Bowel sounds are normal. He exhibits no distension. There is no tenderness. There is no rebound.  Musculoskeletal: Normal range of motion. He exhibits no edema or tenderness.  Neurological: He is alert and oriented to person, place, and time. He has normal  reflexes.  Skin: Skin is warm and dry. No rash noted. No erythema.  Psychiatric: He has a normal mood and affect. His behavior is normal. Thought content normal.   Results for orders placed or performed in visit on 04/07/15  POCT Influenza A/B  Result Value Ref Range   Influenza A, POC Negative Negative   Influenza B, POC Negative Negative      Assessment & Plan:   Problem List Items Addressed This Visit    None    Visit Diagnoses    Influenza-like illness    -   Primary    Given symptom presentation will treat for influenza.  Tamiflu x 5 days.  Return precautions reviewed.    Relevant Medications    oseltamivir (TAMIFLU) 75 MG capsule    Shortness of breath        Improved with nebulizer treatment. Home nebs ordered. Return precautions reviewed. Alarm symptoms reviewed.     Relevant Medications    ipratropium-albuterol (DUONEB) 0.5-2.5 (3) MG/3ML nebulizer solution 3 mL    albuterol (PROVENTIL) (2.5 MG/3ML) 0.083% nebulizer solution    Other Relevant Orders    POCT Influenza A/B (Completed)    DG Chest 2 View (Completed)    DME Nebulizer machine    LLL pneumonia        Treat for CAP. SpO2 increased to 94% on RA after duoneb. Alarm symptoms reviewed. Repeat CXR in 3 weeks.     Relevant Medications    ipratropium-albuterol (DUONEB) 0.5-2.5 (3) MG/3ML nebulizer solution 3 mL    oseltamivir (TAMIFLU) 75 MG capsule    azithromycin (ZITHROMAX) 250 MG tablet    albuterol (PROVENTIL) (2.5 MG/3ML) 0.083% nebulizer solution    Other Relevant Orders    DME Nebulizer machine       Meds ordered this encounter  Medications  . ipratropium-albuterol (DUONEB) 0.5-2.5 (3) MG/3ML nebulizer solution 3 mL    Sig:   . oseltamivir (TAMIFLU) 75 MG capsule    Sig: Take 1 capsule (75 mg total) by mouth 2 (two) times daily.    Dispense:  10 capsule    Refill:  0    Order Specific Question:  Supervising Provider    Answer:  Janeann Forehand [phone]  . azithromycin (ZITHROMAX) 250 MG tablet    Sig: Dispense as Z-pak: Take 2 pills today and 1 pill every day until the bottle is empty.    Dispense:  6 tablet    Refill:  0    Order Specific Question:  Supervising Provider    Answer:  Janeann Forehand [[id number]]  . albuterol (PROVENTIL) (2.5 MG/3ML) 0.083% nebulizer solution    Sig: Take 3 mLs (2.5 mg total) by nebulization every 6 (six) hours as needed for wheezing or shortness of breath.    Dispense:  150 mL    Refill:  1    Order Specific Question:   Supervising Provider    Answer:  Janeann Forehand [[id number]]      Follow up plan: Return in about 3 weeks (around 04/28/2015), or if symptoms worsen or fail to improve.

## 2015-04-07 NOTE — Patient Instructions (Signed)
Community-Acquired Pneumonia, Adult °Pneumonia is an infection of the lungs. One type of pneumonia can happen while a person is in a hospital. A different type can happen when a person is not in a hospital (community-acquired pneumonia). It is easy for this kind to spread from person to person. It can spread to you if you breathe near an infected person who coughs or sneezes. Some symptoms include: °· A dry cough. °· A wet (productive) cough. °· Fever. °· Sweating. °· Chest pain. °HOME CARE °· Take over-the-counter and prescription medicines only as told by your doctor. °¨ Only take cough medicine if you are losing sleep. °¨ If you were prescribed an antibiotic medicine, take it as told by your doctor. Do not stop taking the antibiotic even if you start to feel better. °· Sleep with your head and neck raised (elevated). You can do this by putting a few pillows under your head, or you can sleep in a recliner. °· Do not use tobacco products. These include cigarettes, chewing tobacco, and e-cigarettes. If you need help quitting, ask your doctor. °· Drink enough water to keep your pee (urine) clear or pale yellow. °A shot (vaccine) can help prevent pneumonia. Shots are often suggested for: °· People older than 57 years of age. °· People older than 57 years of age: °¨ Who are having cancer treatment. °¨ Who have long-term (chronic) lung disease. °¨ Who have problems with their body's defense system (immune system). °You may also prevent pneumonia if you take these actions: °· Get the flu (influenza) shot every year. °· Go to the dentist as often as told. °· Wash your hands often. If soap and water are not available, use hand sanitizer. °GET HELP IF: °· You have a fever. °· You lose sleep because your cough medicine does not help. °GET HELP RIGHT AWAY IF: °· You are short of breath and it gets worse. °· You have more chest pain. °· Your sickness gets worse. This is very serious if: °¨ You are an older adult. °¨ Your  body's defense system is weak. °· You cough up blood. °  °This information is not intended to replace advice given to you by your health care provider. Make sure you discuss any questions you have with your health care provider. °  °Document Released: 11/01/2007 Document Revised: 02/03/2015 Document Reviewed: 09/09/2014 °Elsevier Interactive Patient Education ©2016 [employer] ° °Community-Acquired Pneumonia, Adult °Pneumonia is an infection of the lungs. One type of pneumonia can happen while a person is in a hospital. A different type can happen when a person is not in a hospital (community-acquired pneumonia). It is easy for this kind to spread from person to person. It can spread to you if you breathe near an infected person who coughs or sneezes. Some symptoms include: °· A dry cough. °· A wet (productive) cough. °· Fever. °· Sweating. °· Chest pain. °HOME CARE °· Take over-the-counter and prescription medicines only as told by your doctor. °¨ Only take cough medicine if you are losing sleep. °¨ If you were prescribed an antibiotic medicine, take it as told by your doctor. Do not stop taking the antibiotic even if you start to feel better. °· Sleep with your head and neck raised (elevated). You can do this by putting a few pillows under your head, or you can sleep in a recliner. °· Do not use tobacco products. These include cigarettes, chewing tobacco, and e-cigarettes. If you need help quitting, ask your doctor. °· Drink   enough water to keep your pee (urine) clear or pale yellow. °A shot (vaccine) can help prevent pneumonia. Shots are often suggested for: °· People older than 57 years of age. °· People older than 57 years of age: °¨ Who are having cancer treatment. °¨ Who have long-term (chronic) lung disease. °¨ Who have problems with their body's defense system (immune system). °You may also prevent pneumonia if you take these actions: °· Get the flu (influenza) shot every year. °· Go to the dentist as  often as told. °· Wash your hands often. If soap and water are not available, use hand sanitizer. °GET HELP IF: °· You have a fever. °· You lose sleep because your cough medicine does not help. °GET HELP RIGHT AWAY IF: °· You are short of breath and it gets worse. °· You have more chest pain. °· Your sickness gets worse. This is very serious if: °¨ You are an older adult. °¨ Your body's defense system is weak. °· You cough up blood. °  °This information is not intended to replace advice given to you by your health care provider. Make sure you discuss any questions you have with your health care provider. °  °Document Released: 11/01/2007 Document Revised: 02/03/2015 Document Reviewed: 09/09/2014 °Elsevier Interactive Patient Education ©2016 Elsevier Inc. ° °

## 2015-04-07 NOTE — Telephone Encounter (Signed)
Pt was concerned about one of his med's cost $ 64 so called the pharmacy and it was Tamiflu so pt doesn't want to pick up that meds or if we have any other option and wants reply with your suggestion ?

## 2015-04-07 NOTE — Telephone Encounter (Signed)
Jonathon MuirJamie at [facility] needs the office notes with diagnosis for nebulizer faxed to [phone].   His call back number is [phone]

## 2015-04-07 NOTE — Telephone Encounter (Signed)
Spoke to Jonathon Graham and told Jonathon Graham that we are on Epic so he will take care of it and also make sure that insurance can cover. Jonathon Graham.

## 2015-04-12 ENCOUNTER — Encounter: Payer: Self-pay | Admitting: Family Medicine

## 2015-05-04 ENCOUNTER — Encounter: Payer: Self-pay | Admitting: Family Medicine

## 2015-05-04 ENCOUNTER — Telehealth: Payer: Self-pay | Admitting: Family Medicine

## 2015-05-04 ENCOUNTER — Other Ambulatory Visit: Payer: Self-pay | Admitting: Family Medicine

## 2015-05-04 ENCOUNTER — Ambulatory Visit (INDEPENDENT_AMBULATORY_CARE_PROVIDER_SITE_OTHER): Payer: BC Managed Care – PPO | Admitting: Family Medicine

## 2015-05-04 ENCOUNTER — Other Ambulatory Visit: Admission: RE | Admit: 2015-05-04 | Payer: BC Managed Care – PPO | Source: Ambulatory Visit | Admitting: *Deleted

## 2015-05-04 ENCOUNTER — Ambulatory Visit
Admission: RE | Admit: 2015-05-04 | Discharge: 2015-05-04 | Disposition: A | Discharge status: Home / Self Care | Payer: BC Managed Care – PPO | Source: Ambulatory Visit | Attending: Family Medicine | Admitting: Family Medicine

## 2015-05-04 VITALS — BP 129/87 | HR 70 | Temp 98.3°F | Resp 16 | Ht 67.5 in | Wt 346.8 lb

## 2015-05-04 DIAGNOSIS — M79605 Pain in left leg: Secondary | ICD-10-CM

## 2015-05-04 DIAGNOSIS — M79662 Pain in left lower leg: Secondary | ICD-10-CM | POA: Diagnosis not present

## 2015-05-04 DIAGNOSIS — M7989 Other specified soft tissue disorders: Secondary | ICD-10-CM

## 2015-05-04 DIAGNOSIS — E119 Type 2 diabetes mellitus without complications: Secondary | ICD-10-CM | POA: Diagnosis not present

## 2015-05-04 DIAGNOSIS — R609 Edema, unspecified: Secondary | ICD-10-CM | POA: Diagnosis not present

## 2015-05-04 DIAGNOSIS — M79604 Pain in right leg: Secondary | ICD-10-CM | POA: Diagnosis not present

## 2015-05-04 DIAGNOSIS — E1142 Type 2 diabetes mellitus with diabetic polyneuropathy: Secondary | ICD-10-CM | POA: Insufficient documentation

## 2015-05-04 LAB — POCT GLYCOSYLATED HEMOGLOBIN (HGB A1C): Hemoglobin A1C: 6.6

## 2015-05-04 MED ORDER — MELOXICAM 7.5 MG PO TABS: 7.5000 mg | ORAL_TABLET | Freq: Every day | ORAL | Status: DC

## 2015-05-04 MED ORDER — FUROSEMIDE 20 MG PO TABS: 20.0000 mg | ORAL_TABLET | Freq: Every day | ORAL | Status: DC

## 2015-05-04 NOTE — Patient Instructions (Addendum)
We will rule out a blood clot in the Left lower leg today. I have also given you some medication for pain and swelling.    If you develop shortness of breath or chest pain, please got the ER.   We will follow-up on your diabetes next week.

## 2015-05-04 NOTE — Progress Notes (Signed)
Subjective:    Patient ID: Jonathon PaulaKeith Graham, male    DOB: 1955/10/05, 59 y.o.   MRN: [id number]  HPI: Jonathon Graham is a 59 y.o. male presenting on 05/04/2015 for Leg Swelling   HPI  Pt presents for leg swelling and pain. L>R swelling is painful. Goes down with elevation but begins again each day. Symptoms present x 7 days. L calf is tender. Pt has had similar leg swelling about 5 years ago that [facility]. Pt took 800mg  of ibuprofen at home with mild relief of pain   Pt is also due for diabetes follow-up. Missed several due to acute visits on the same day.  HgA1c done today. He is requesting to try a medication for diabetes that may help with weight loss.   Past Medical History  Diagnosis Date  . Hypertension   . Fibromyalgia, primary   . Diabetes mellitus without complication (HCC)   . Prostate disorder   . Benign prostatic hyperplasia (BPH) with urinary urgency     nodular  . Depression   . Arthritis   . Essential hypertension     Current Outpatient Prescriptions on File Prior to Visit  Medication Sig  . albuterol (PROVENTIL) (2.5 MG/3ML) 0.083% nebulizer solution Take 3 mLs (2.5 mg total) by nebulization every 6 (six) hours as needed for wheezing or shortness of breath.  Marland Kitchen. aspirin 81 MG tablet Take 81 mg by mouth daily. Takes 2 - 81mg  aspirin daily.  . hydrochlorothiazide (HYDRODIURIL) 25 MG tablet Take 25 mg by mouth daily.  . metFORMIN (GLUCOPHAGE) 1000 MG tablet Take 1 tablet (1,000 mg total) by mouth 2 (two) times daily with a meal.  . tamsulosin (FLOMAX) 0.4 MG CAPS capsule Take 0.4 mg by mouth.   Current Facility-Administered Medications on File Prior to Visit  Medication  . ipratropium-albuterol (DUONEB) 0.5-2.5 (3) MG/3ML nebulizer solution 3 mL    Review of Systems  Constitutional: Negative for fever and chills.  Respiratory: Negative for chest tightness, shortness of breath and wheezing.   Cardiovascular: Positive for leg swelling. Negative  for chest pain and palpitations.  Endocrine: Negative for polydipsia, polyphagia and polyuria.  Musculoskeletal: Positive for myalgias.   Per HPI unless specifically indicated above     Objective:    BP 129/87 mmHg  Pulse 70  Temp(Src) 98.3 F (36.8 C) (Oral)  Resp 16  Ht 5' 7.5" (1.715 m)  Wt 346 lb 12.8 oz (157.307 kg)  BMI 53.48 kg/m2  Wt Readings from Last 3 Encounters:  05/04/15 346 lb 12.8 oz (157.307 kg)  04/07/15 347 lb (157.398 kg)  03/16/15 347 lb (157.398 kg)    Physical Exam  Constitutional: He is oriented to person, place, and time. He appears well-developed and well-nourished. No distress.  HENT:  Head: Normocephalic and atraumatic.  Cardiovascular: Normal rate and regular rhythm.  Exam reveals no gallop and no friction rub.   No murmur heard. Pulmonary/Chest: Effort normal and breath sounds normal. No respiratory distress. He has no wheezes. He has no rales. He exhibits no tenderness.  Musculoskeletal: Normal range of motion.       Left lower leg: He exhibits tenderness (L calf tender.), swelling and edema (+1).  L calf circumference 51.5cm. R calf circumference 50.5cm.  L calf tender but negative homans sign.    Neurological: He is alert and oriented to person, place, and time. He displays normal reflexes.  Skin: Skin is warm and dry. No rash noted. He is not diaphoretic. No erythema.  No pallor.   Results for orders placed or performed in visit on 05/04/15  POCT HgB A1C  Result Value Ref Range   Hemoglobin A1C 6.6       Assessment & Plan:   Problem List Items Addressed This Visit      Endocrine   Type 2 diabetes mellitus without complication (HCC) - Primary    HgA1c 6.6% today. Full diabetes follow-up deferred due to acute L swelling. Lipid panel ordered to stratify cardiovascular risk factors. Consider victoza for DM management given patient stated goal to lose weight.  RTC 1 week for DM follow-up.       Relevant Orders   POCT HgB A1C  (Completed)   Lipid Profile    Other Visit Diagnoses    Pain and swelling of lower leg, left        Rule out DVT- stat ultrasound ordered.  Likely related to arthritis and overuse at work. Meloxicam for pain.  Lasix PRN for swelling.  RTC 1 week for check.     Relevant Medications    furosemide (LASIX) 20 MG tablet    meloxicam (MOBIC) 7.5 MG tablet    Other Relevant Orders    US Venous Img Lower Bilateral    Comprehensive metabolic panel    Edema, unspecified type        Check TSH and CMP.    Relevant Orders    Comprehensive metabolic panel    TSH       Meds ordered this encounter  Medications  . furosemide (LASIX) 20 MG tablet    Sig: Take 1 tablet (20 mg total) by mouth daily.    Dispense:  30 tablet    Refill:  0    Order Specific Question:  Supervising Provider    Answer:  Janeann Forehand [phone]  . meloxicam (MOBIC) 7.5 MG tablet    Sig: Take 1 tablet (7.5 mg total) by mouth daily.    Dispense:  30 tablet    Refill:  0    Order Specific Question:  Supervising Provider    Answer:  Janeann Forehand [[id number]]      Follow up plan: Return in about 1 week (around 05/11/2015) for diabetes, leg swelling. Marland Kitchen

## 2015-05-04 NOTE — Assessment & Plan Note (Signed)
HgA1c 6.6% today. Full diabetes follow-up deferred due to acute L swelling. Lipid panel ordered to stratify cardiovascular risk factors. Consider victoza for DM management given patient stated goal to lose weight.  RTC 1 week for DM follow-up.

## 2015-05-04 NOTE — Telephone Encounter (Signed)
Called [employer] for prior authorization pt doesn't need prior authorization for venus bilateral ultrasound.

## 2015-05-05 ENCOUNTER — Other Ambulatory Visit
Admission: RE | Admit: 2015-05-05 | Discharge: 2015-05-05 | Disposition: A | Discharge status: Home / Self Care | Payer: BC Managed Care – PPO | Source: Ambulatory Visit | Attending: Family Medicine | Admitting: Family Medicine

## 2015-05-05 DIAGNOSIS — R609 Edema, unspecified: Secondary | ICD-10-CM | POA: Diagnosis not present

## 2015-05-05 DIAGNOSIS — E119 Type 2 diabetes mellitus without complications: Secondary | ICD-10-CM | POA: Diagnosis not present

## 2015-05-05 DIAGNOSIS — M79662 Pain in left lower leg: Secondary | ICD-10-CM | POA: Diagnosis not present

## 2015-05-05 LAB — COMPREHENSIVE METABOLIC PANEL
ALT: 19 U/L (ref 17–63)
AST: 16 U/L (ref 15–41)
Albumin: 3.8 g/dL (ref 3.5–5.0)
Alkaline Phosphatase: 41 U/L (ref 38–126)
Anion gap: 8 (ref 5–15)
BUN: 18 mg/dL (ref 6–20)
CO2: 31 mmol/L (ref 22–32)
Calcium: 8.8 mg/dL — ABNORMAL LOW (ref 8.9–10.3)
Chloride: 99 mmol/L — ABNORMAL LOW (ref 101–111)
Creatinine, Ser: 0.72 mg/dL (ref 0.61–1.24)
GFR calc Af Amer: >60 mL/min (ref >60)
GFR calc non Af Amer: >60 mL/min (ref >60)
Glucose, Bld: 198 mg/dL — ABNORMAL HIGH (ref 65–99)
Potassium: 4.1 mmol/L (ref 3.5–5.1)
Sodium: 138 mmol/L (ref 135–145)
Total Bilirubin: 0.9 mg/dL (ref 0.3–1.2)
Total Protein: 7.2 g/dL (ref 6.5–8.1)

## 2015-05-05 LAB — LIPID PANEL
Cholesterol: 146 mg/dL (ref 0–200)
HDL: 43 mg/dL (ref >40)
LDL Cholesterol: 85 mg/dL (ref 0–99)
Total CHOL/HDL Ratio: 3.4 RATIO
Triglycerides: 89 mg/dL (ref <150)
VLDL: 18 mg/dL (ref 0–40)

## 2015-05-05 LAB — TSH: TSH: 0.875 u[IU]/mL (ref 0.350–4.500)

## 2015-05-06 ENCOUNTER — Telehealth: Payer: Self-pay | Admitting: Family Medicine

## 2015-05-06 MED ORDER — CALCIUM CARBONATE-VITAMIN D 500-200 MG-UNIT PO TABS: 1.0000 | ORAL_TABLET | Freq: Two times a day (BID) | ORAL | Status: DC

## 2015-05-06 NOTE — Telephone Encounter (Signed)
Called pt to review labs results:  CMP: His calcium levels are low. Start os-cal daily to help with Ca. Recheck next visit.   Reinforced that diabetes follow-up is needed. Pt will schedule appt.

## 2015-05-14 ENCOUNTER — Ambulatory Visit: Payer: BC Managed Care – PPO | Admitting: Family Medicine

## 2015-05-26 ENCOUNTER — Ambulatory Visit (INDEPENDENT_AMBULATORY_CARE_PROVIDER_SITE_OTHER): Payer: BC Managed Care – PPO | Admitting: Family Medicine

## 2015-05-26 ENCOUNTER — Encounter: Payer: Self-pay | Admitting: Family Medicine

## 2015-05-26 ENCOUNTER — Ambulatory Visit
Admission: RE | Admit: 2015-05-26 | Discharge: 2015-05-26 | Disposition: A | Discharge status: Home / Self Care | Payer: BC Managed Care – PPO | Source: Ambulatory Visit | Attending: Family Medicine | Admitting: Family Medicine

## 2015-05-26 VITALS — BP 127/86 | HR 66 | Temp 98.3°F | Resp 16 | Ht 67.0 in | Wt 347.0 lb

## 2015-05-26 DIAGNOSIS — M25562 Pain in left knee: Secondary | ICD-10-CM

## 2015-05-26 DIAGNOSIS — E119 Type 2 diabetes mellitus without complications: Secondary | ICD-10-CM

## 2015-05-26 DIAGNOSIS — Z6841 Body Mass Index (BMI) 40.0 and over, adult: Secondary | ICD-10-CM | POA: Diagnosis not present

## 2015-05-26 DIAGNOSIS — R600 Localized edema: Secondary | ICD-10-CM

## 2015-05-26 LAB — POCT UA - MICROALBUMIN
Albumin/Creatinine Ratio, Urine, POC: 0
Creatinine, POC: 0 mg/dL
Microalbumin Ur, POC: 0 mg/L

## 2015-05-26 MED ORDER — PEN NEEDLES 31G X 5 MM MISC: 1.0000 | Freq: Every day | Status: DC

## 2015-05-26 MED ORDER — LIRAGLUTIDE 18 MG/3ML ~~LOC~~ SOPN: PEN_INJECTOR | SUBCUTANEOUS | Status: DC

## 2015-05-26 NOTE — Assessment & Plan Note (Signed)
Pt is desirous of losing weight. Joined gym at his office. Have started victoza to help with diabetes and weight loss.  Encouraged pt go to [facility] to see RD about losing weight. Pt might be a candidate for bariatric surgery.

## 2015-05-26 NOTE — Patient Instructions (Signed)
L knee pain-  We will get an Xray and send you to see an orthopedist. Start taking ibuprofen as needed for pain.  Swelling: Where compression socks at work. Elevate your feet when sitting. Drink plenty of water.  Diabetes: Start victoza 0.6mg  daily for the first week and then 1.2mg  daily injected under the skin. This will help with your diabetes and your weight loss.

## 2015-05-26 NOTE — Assessment & Plan Note (Signed)
Start Victoza to help with weight loss and diabetes control. Continue metformin.  Eye exam due 05/2015 Urine micro negative. Not on an ACE. Consider adding low dose ACE for renal protection.  Foot exam done.

## 2015-05-26 NOTE — Assessment & Plan Note (Signed)
Continue PRN lasix for swelling. Hold HCTZ at this time.  Pt encouraged to elevate his feet. Wear compression stockings at work. Maintain adequate hydration.

## 2015-05-26 NOTE — Progress Notes (Signed)
Subjective:    Patient ID: Jonathon Graham, male    DOB: 12-30-1955, 59 y.o.   MRN: [id number]  HPI: Jonathon Graham is a 59 y.o. male presenting on 05/26/2015 for Leg Pain   HPI  Pt presents for leg pain. He has been seen for this issue x 2. Symptoms have been present since October of 2016. Symptoms include pain from L knee to the calf.  He is taking lasix for swelling that is helping with the swelling.  Meloxicam is helping but not getting rid of the pain.  Pedel edema- up to mid calf. Goes down with rest. Increases when standing on his feet. Improved with lasix and elevation. Pt does not drink water.   Diabetes: Not checking sugars at home. Has not had diabetes follow-up in several months. A1c was done at last acute visit and he was due for follow-up but never made appt. No numbness and tingling in the feet. No chest pain or shortness of breath. No visual changes. No polydipsia or polyphagia. Taking metformin twice daily (1000mg ). Urine microalbumin done today. Eye exam due in January 2017.  Past Medical History  Diagnosis Date  . Hypertension   . Fibromyalgia, primary   . Diabetes mellitus without complication (HCC)   . Prostate disorder   . Benign prostatic hyperplasia (BPH) with urinary urgency     nodular  . Depression   . Arthritis   . Essential hypertension   . Obesity     Current Outpatient Prescriptions on File Prior to Visit  Medication Sig  . albuterol (PROVENTIL) (2.5 MG/3ML) 0.083% nebulizer solution Take 3 mLs (2.5 mg total) by nebulization every 6 (six) hours as needed for wheezing or shortness of breath.  Marland Kitchen. aspirin 81 MG tablet Take 81 mg by mouth daily. Takes 2 - 81mg  aspirin daily.  . calcium-vitamin D (OSCAL 500/200 D-3) 500-200 MG-UNIT tablet Take 1 tablet by mouth 2 (two) times daily.  . furosemide (LASIX) 20 MG tablet Take 1 tablet (20 mg total) by mouth daily.  . meloxicam (MOBIC) 7.5 MG tablet Take 1 tablet (7.5 mg total) by mouth daily.  . metFORMIN  (GLUCOPHAGE) 1000 MG tablet Take 1 tablet (1,000 mg total) by mouth 2 (two) times daily with a meal.  . tamsulosin (FLOMAX) 0.4 MG CAPS capsule Take 0.4 mg by mouth.   Current Facility-Administered Medications on File Prior to Visit  Medication  . ipratropium-albuterol (DUONEB) 0.5-2.5 (3) MG/3ML nebulizer solution 3 mL    Review of Systems  Constitutional: Negative for fever and chills.  HENT: Negative.   Eyes: Negative for visual disturbance.  Respiratory: Negative for chest tightness, shortness of breath and wheezing.   Cardiovascular: Positive for leg swelling. Negative for chest pain and palpitations.  Gastrointestinal: Negative.   Endocrine: Negative for cold intolerance, heat intolerance, polydipsia, polyphagia and polyuria.  Musculoskeletal: Positive for joint swelling and arthralgias.  Skin: Negative for color change and rash.  Neurological: Negative for light-headedness, numbness and headaches.  Psychiatric/Behavioral: Negative.    Per HPI unless specifically indicated above     Objective:    BP 127/86 mmHg  Pulse 66  Temp(Src) 98.3 F (36.8 C) (Oral)  Resp 16  Ht 5\' 7"  (1.702 m)  Wt 347 lb (157.398 kg)  BMI 54.34 kg/m2  Wt Readings from Last 3 Encounters:  05/26/15 347 lb (157.398 kg)  05/04/15 346 lb 12.8 oz (157.307 kg)  04/07/15 347 lb (157.398 kg)    Physical Exam  Constitutional: He is oriented to person,  place, and time. He appears well-developed and well-nourished. No distress.  Neck: Normal range of motion. Neck supple. No thyromegaly present.  Cardiovascular: Normal rate and regular rhythm.  Exam reveals no gallop and no friction rub.   No murmur heard. Pulmonary/Chest: Effort normal and breath sounds normal.  Abdominal: Soft. Bowel sounds are normal. There is no tenderness. There is no rebound.  Musculoskeletal: He exhibits edema (+2 pitting edema to mild calf BLE).       Right knee: Normal.       Left knee: He exhibits decreased range of motion  and swelling. He exhibits no deformity and no bony tenderness. Tenderness found. Medial joint line tenderness noted.  Lymphadenopathy:    He has no cervical adenopathy.  Neurological: He is alert and oriented to person, place, and time.  Skin: Skin is warm and dry. He is not diaphoretic.   Diabetic Foot Exam - Simple   Simple Foot Form  Diabetic Foot exam was performed with the following findings:  Yes 05/26/2015 11:27 AM  Visual Inspection  No deformities, no ulcerations, no other skin breakdown bilaterally:  Yes  Sensation Testing  Intact to touch and monofilament testing bilaterally:  Yes  Pulse Check  Posterior Tibialis and Dorsalis pulse intact bilaterally:  Yes  Comments      Results for orders placed or performed in visit on 05/26/15  POCT UA - Microalbumin  Result Value Ref Range   Microalbumin Ur, POC 0 mg/L   Creatinine, POC 0 mg/dL   Albumin/Creatinine Ratio, Urine, POC 0       Assessment & Plan:   Problem List Items Addressed This Visit      Endocrine   Type 2 diabetes mellitus without complication (HCC) - Primary    Start Victoza to help with weight loss and diabetes control. Continue metformin.  Eye exam due 05/2015 Urine micro negative. Not on an ACE. Consider adding low dose ACE for renal protection.  Foot exam done.       Relevant Medications   Liraglutide (VICTOZA) 18 MG/3ML SOPN   Insulin Pen Needle (PEN NEEDLES) 31G X 5 MM MISC   Other Relevant Orders   POCT UA - Microalbumin (Completed)   HM Diabetes Foot Exam (Completed)     Other   Pedal edema    Continue PRN lasix for swelling. Hold HCTZ at this time.  Pt encouraged to elevate his feet. Wear compression stockings at work. Maintain adequate hydration.       BMI 50.0-59.9, adult (HCC)    Pt is desirous of losing weight. Joined gym at his office. Have started victoza to help with diabetes and weight loss.  Encouraged pt go to [facility] to see RD about losing weight. Pt might be a  candidate for bariatric surgery.       Relevant Medications   Liraglutide (VICTOZA) 18 MG/3ML SOPN    Other Visit Diagnoses    Lateral knee pain, left        Pt has history of osetoarthritis of the knee. Meloxicam is provding mild relief. Encouraged pt to attend PT- he doesn't have time. Seeking ortho consult for oth    Relevant Orders    DG Knee 1-2 Views Left    Ambulatory referral to Orthopedic Surgery       Meds ordered this encounter  Medications  . Liraglutide (VICTOZA) 18 MG/3ML SOPN    Sig: Start by injecting 0.6mg  daily under the skin for 1 week and then increase to 1.2mg  daily  injected under the skin.    Dispense:  6 mL    Refill:  11    Order Specific Question:  Supervising Provider    Answer:  Janeann Forehand [phone]  . Insulin Pen Needle (PEN NEEDLES) 31G X 5 MM MISC    Sig: 1 each by Does not apply route daily.    Dispense:  100 each    Refill:  5    Order Specific Question:  Supervising Provider    Answer:  Janeann Forehand [[id number]]      Follow up plan: Return in about 3 months (around 08/24/2015) for diabetes.

## 2015-05-27 ENCOUNTER — Telehealth: Payer: Self-pay | Admitting: Family Medicine

## 2015-05-27 NOTE — Telephone Encounter (Signed)
Called pt to review XR results. LMTCB.   His XR showed arthritis only. We will continue with the plan we discussed at the visit and his orthopedic referral. Also, since he is taking lasix everyday, I want him to stop his hydrochlorothiazide until he completes the lasix. He can resume it when he finishes lasix on Jan 6. It should also help with swelling.   Thanks! AK

## 2015-05-28 NOTE — Telephone Encounter (Signed)
Spoke to patient's spouse who had him on the other end. Relayed message of normal xray only arthritis. Reminded of ortho referral who will be contacting them to schedule. Also reminded about Lasix/HCTZ.

## 2015-06-08 ENCOUNTER — Telehealth: Payer: Self-pay | Admitting: Family Medicine

## 2015-06-08 ENCOUNTER — Encounter: Payer: Self-pay | Admitting: *Deleted

## 2015-06-08 NOTE — Telephone Encounter (Signed)
I had a message on my phone, pt's wife requested a phone call from a nurse.  Please call [phone]

## 2015-06-08 NOTE — Telephone Encounter (Signed)
Spoke to patient's spouse who requested a f/u appt for cough. Appt has been scheduled.

## 2015-06-11 ENCOUNTER — Ambulatory Visit: Payer: Self-pay | Admitting: Family Medicine

## 2015-06-15 ENCOUNTER — Encounter: Payer: Self-pay | Admitting: Family Medicine

## 2015-06-15 ENCOUNTER — Ambulatory Visit (INDEPENDENT_AMBULATORY_CARE_PROVIDER_SITE_OTHER): Payer: BC Managed Care – PPO | Admitting: Family Medicine

## 2015-06-15 VITALS — BP 125/87 | HR 71 | Temp 98.4°F | Resp 16 | Ht 67.0 in | Wt 340.0 lb

## 2015-06-15 DIAGNOSIS — M1712 Unilateral primary osteoarthritis, left knee: Secondary | ICD-10-CM

## 2015-06-15 DIAGNOSIS — Z720 Tobacco use: Secondary | ICD-10-CM

## 2015-06-15 DIAGNOSIS — G4733 Obstructive sleep apnea (adult) (pediatric): Secondary | ICD-10-CM

## 2015-06-15 DIAGNOSIS — N3941 Urge incontinence: Secondary | ICD-10-CM | POA: Diagnosis not present

## 2015-06-15 LAB — POCT URINALYSIS DIPSTICK
Bilirubin, UA: NEGATIVE
Blood, UA: NEGATIVE
Glucose, UA: NEGATIVE
Ketones, UA: NEGATIVE
Leukocytes, UA: NEGATIVE
Nitrite, UA: NEGATIVE
Protein, UA: NEGATIVE
Spec Grav, UA: 1.015
Urobilinogen, UA: NEGATIVE
pH, UA: 5

## 2015-06-15 NOTE — Addendum Note (Signed)
Addended byLaroy Apple, Farris Geiman L on: 06/15/2015 01:04 PM   Modules accepted: Orders

## 2015-06-15 NOTE — Assessment & Plan Note (Signed)
Does not appear CPAP is effective at this time. User error vs. Setting change. Sleep medicine recommendation is to initiate a CPAP autotrial with [facility] to determine if settings are correct. Consider sleep medicine referral if symptoms do not improve.  RTC 1 mos.

## 2015-06-15 NOTE — Assessment & Plan Note (Signed)
Ortho referral pending. Have advised pt to cancel this appt if he doesn't want to. Referral to PT placed today for pain management and strengthening.  Continue meloxicam PRN for pain.

## 2015-06-15 NOTE — Patient Instructions (Signed)
Try taking your diuretic in the afternoon after work. This may help your urge to got to the bathroom. We will check your urine to make sure nothing is causing your symptoms.  We will have [facility] check your CPAP machine workings. New sleep study ordered.   We will have start PT for your knee. This may also help with strengthening for the weakness you reported.

## 2015-06-15 NOTE — Assessment & Plan Note (Signed)
Counseled pt to quit smoking. 5 minute smoking cessation counseling given.

## 2015-06-15 NOTE — Progress Notes (Signed)
Subjective:    Patient ID: Jonathon Graham, male    DOB: [birth year], 58 y.o.   MRN: [id number]  HPI: Jonathon Graham is a 61 y.o. male presenting on 06/15/2015 for Sleep Apnea; Urinary Incontinence; and Knee Pain   Knee Pain  The incident occurred more than 1 week ago. There was no injury mechanism. The pain is present in the left knee. The quality of the pain is described as aching. The pain is at a severity of 5/10. The pain is moderate. The pain has been intermittent since onset. Pertinent negatives include no inability to bear weight, loss of motion, loss of sensation, muscle weakness, numbness or tingling. He has tried acetaminophen, NSAIDs and elevation for the symptoms. The treatment provided mild relief.    Pt was diagnosed with sleep apnea 1.5 years ago.  Fell asleep talking to someone. Wears CPAP nightly. Unsure of the settings. Sleep study May 05, 2013. Never has maintenance on his CPAP machine. CPAP helps mildly when he uses.   He is having issues with incontinence. Cannot hold his urine. Is taking his diuretics at am in the morning. Must void within 30 minutes. Has trouble getting to the bathroom at his job. No flank pain, no dysuria. Wearing depends. Incontinence only happens during the day.   Pt also concerned about L knee pain. Was referred to ortho at his last visit. He and wife are concerned about cost. He states orthopedics hasn't helped him in the past for his leg pain.   Past Medical History  Diagnosis Date  . Hypertension   . Fibromyalgia, primary   . Diabetes mellitus without complication (HCC)   . Prostate disorder   . Benign prostatic hyperplasia (BPH) with urinary urgency     nodular  . Depression   . Arthritis   . Essential hypertension   . Obesity     Current Outpatient Prescriptions on File Prior to Visit  Medication Sig  . albuterol (PROVENTIL) (2.5 MG/3ML) 0.083% nebulizer solution Take 3 mLs (2.5 mg total) by nebulization every 6 (six) hours as needed for  wheezing or shortness of breath.  Marland Kitchen aspirin 81 MG tablet Take 81 mg by mouth daily. Takes 2 -  aspirin daily.  . calcium-vitamin D (OSCAL 500/200 D-3) 500-200 MG-UNIT tablet Take 1 tablet by mouth 2 (two) times daily.  . furosemide (LASIX) 20 MG tablet Take 1 tablet (20 mg total) by mouth daily.  . Insulin Pen Needle (PEN NEEDLES) 31G X 5 MM MISC 1 each by Does not apply route daily.  . Liraglutide (VICTOZA) 18 MG/3ML SOPN Start by injecting 0.6mg  daily under the skin for 1 week and then increase to 1.2mg  daily injected under the skin.  . meloxicam (MOBIC) 7.5 MG tablet Take 1 tablet (7.5 mg total) by mouth daily.  . metFORMIN (GLUCOPHAGE) 1000 MG tablet Take 1 tablet (1,000 mg total) by mouth 2 (two) times daily with a meal.  . tamsulosin (FLOMAX) 0.4 MG CAPS capsule Take 0.4 mg by mouth.   Current Facility-Administered Medications on File Prior to Visit  Medication  . ipratropium-albuterol (DUONEB) 0.5-2.5 (3) MG/3ML nebulizer solution 3 mL    Review of Systems  Constitutional: Positive for fatigue. Negative for fever and chills.  HENT: Negative.   Respiratory: Negative for chest tightness, shortness of breath and wheezing.   Cardiovascular: Positive for leg swelling. Negative for chest pain and palpitations.  Gastrointestinal: Negative for nausea, vomiting and abdominal pain.  Endocrine: Negative.   Genitourinary: Positive for urgency. Negative  for dysuria, flank pain, discharge, penile pain and testicular pain.  Musculoskeletal: Positive for back pain, joint swelling and arthralgias.  Skin: Negative.   Neurological: Negative for dizziness, tingling, weakness, numbness and headaches.  Psychiatric/Behavioral: Negative for sleep disturbance and dysphoric mood.   Per HPI unless specifically indicated above     Objective:    BP 125/87 mmHg  Pulse 71  Temp(Src) 98.4 F (36.9 C) (Oral)  Resp 16  Ht  (1.702 m)  Wt 340 lb (154.223 kg)  BMI 53.24 kg/m2  Wt Readings from  Last 3 Encounters:  06/15/15 340 lb (154.223 kg)  05/26/15 347 lb (157.398 kg)  05/04/15 346 lb 12.8 oz (157.307 kg)    Physical Exam  Constitutional: He is oriented to person, place, and time. He appears well-developed and well-nourished. No distress.  HENT:  Head: Normocephalic and atraumatic.  Neck: Neck supple. No thyromegaly present.  Cardiovascular: Normal rate, regular rhythm and normal heart sounds.  Exam reveals no gallop and no friction rub.   No murmur heard. Pulmonary/Chest: Effort normal and breath sounds normal. He has no wheezes.  Abdominal: Soft. Bowel sounds are normal. He exhibits no distension. There is no tenderness. There is no rebound.  Musculoskeletal: He exhibits no edema or tenderness.       Left knee: He exhibits decreased range of motion (due to pain.). He exhibits no swelling, no effusion and no erythema. No tenderness found.  Neurological: He is alert and oriented to person, place, and time. He has normal reflexes.  Skin: Skin is warm and dry. No rash noted. No erythema.  Psychiatric: He has a normal mood and affect. His behavior is normal. Thought content normal.   Results for orders placed or performed in visit on 06/15/15  POCT Urinalysis Dipstick  Result Value Ref Range   Color, UA dark yellow    Clarity, UA clear    Glucose, UA negative    Bilirubin, UA negative    Ketones, UA negative    Spec Grav, UA 1.015    Blood, UA negative    pH, UA 5.0    Protein, UA negative    Urobilinogen, UA negative    Nitrite, UA negative    Leukocytes, UA Negative Negative      Assessment & Plan:   Problem List Items Addressed This Visit      Respiratory   Severe obstructive sleep apnea    Does not appear CPAP is effective at this time. User error vs. Setting change. Sleep medicine recommendation is to initiate a CPAP autotrial with [facility] to determine if settings are correct. Consider sleep medicine referral if symptoms do not improve.  RTC 1 mos.           Musculoskeletal and Integument   Primary osteoarthritis of right knee    Ortho referral pending. Have advised pt to cancel this appt if he doesn't want to. Referral to PT placed today for pain management and strengthening.  Continue meloxicam PRN for pain.        Other Visit Diagnoses    Urge incontinence of urine    -  Primary    Likely related to diuretic use. Encouraged pt to take diuretics at home when he is off the job to ensure he has access to bathroom. UA/UC ordered.     Relevant Orders    POCT Urinalysis Dipstick (Completed)    CULTURE, URINE COMPREHENSIVE       No orders of the defined types were placed  in this encounter.      Follow up plan: Return in about 4 weeks (around 07/13/2015) for sleep issues, weakness. Marland Kitchen

## 2015-06-18 ENCOUNTER — Other Ambulatory Visit: Payer: Self-pay | Admitting: Family Medicine

## 2015-06-18 DIAGNOSIS — N3000 Acute cystitis without hematuria: Secondary | ICD-10-CM

## 2015-06-18 LAB — CULTURE, URINE COMPREHENSIVE

## 2015-06-18 MED ORDER — SULFAMETHOXAZOLE-TRIMETHOPRIM 800-160 MG PO TABS
1.0000 | ORAL_TABLET | Freq: Two times a day (BID) | ORAL | Status: DC
Start: 2015-06-18 — End: 2015-11-26

## 2015-07-19 ENCOUNTER — Telehealth: Payer: Self-pay | Admitting: Family Medicine

## 2015-07-19 NOTE — Telephone Encounter (Signed)
Called pt for clarification of FMLA forms.

## 2015-07-23 ENCOUNTER — Ambulatory Visit: Payer: BC Managed Care – PPO | Admitting: Family Medicine

## 2015-07-30 ENCOUNTER — Ambulatory Visit: Payer: BC Managed Care – PPO | Admitting: Family Medicine

## 2015-11-11 ENCOUNTER — Other Ambulatory Visit: Payer: Self-pay | Admitting: Family Medicine

## 2015-11-12 ENCOUNTER — Other Ambulatory Visit: Payer: Self-pay | Admitting: Family Medicine

## 2015-11-26 ENCOUNTER — Encounter (INDEPENDENT_AMBULATORY_CARE_PROVIDER_SITE_OTHER): Payer: Self-pay

## 2015-11-26 ENCOUNTER — Ambulatory Visit (INDEPENDENT_AMBULATORY_CARE_PROVIDER_SITE_OTHER): Payer: BC Managed Care – PPO | Admitting: Family Medicine

## 2015-11-26 ENCOUNTER — Encounter: Payer: Self-pay | Admitting: Family Medicine

## 2015-11-26 VITALS — BP 129/76 | HR 68 | Temp 98.4°F | Resp 16 | Ht 67.0 in | Wt 340.0 lb

## 2015-11-26 DIAGNOSIS — E119 Type 2 diabetes mellitus without complications: Secondary | ICD-10-CM

## 2015-11-26 DIAGNOSIS — J431 Panlobular emphysema: Secondary | ICD-10-CM | POA: Diagnosis not present

## 2015-11-26 DIAGNOSIS — E1142 Type 2 diabetes mellitus with diabetic polyneuropathy: Secondary | ICD-10-CM

## 2015-11-26 DIAGNOSIS — M79662 Pain in left lower leg: Secondary | ICD-10-CM | POA: Diagnosis not present

## 2015-11-26 DIAGNOSIS — R600 Localized edema: Secondary | ICD-10-CM

## 2015-11-26 DIAGNOSIS — J181 Lobar pneumonia, unspecified organism: Secondary | ICD-10-CM

## 2015-11-26 DIAGNOSIS — J189 Pneumonia, unspecified organism: Secondary | ICD-10-CM | POA: Diagnosis not present

## 2015-11-26 DIAGNOSIS — J449 Chronic obstructive pulmonary disease, unspecified: Secondary | ICD-10-CM | POA: Insufficient documentation

## 2015-11-26 DIAGNOSIS — R0602 Shortness of breath: Secondary | ICD-10-CM

## 2015-11-26 DIAGNOSIS — Z6841 Body Mass Index (BMI) 40.0 and over, adult: Secondary | ICD-10-CM | POA: Diagnosis not present

## 2015-11-26 DIAGNOSIS — M7989 Other specified soft tissue disorders: Secondary | ICD-10-CM | POA: Diagnosis not present

## 2015-11-26 MED ORDER — GABAPENTIN 100 MG PO CAPS: ORAL_CAPSULE | ORAL | Status: DC

## 2015-11-26 MED ORDER — METFORMIN HCL 1000 MG PO TABS
1000.0000 mg | ORAL_TABLET | Freq: Two times a day (BID) | ORAL | Status: DC
Start: 2015-11-26 — End: 2017-02-14

## 2015-11-26 MED ORDER — ALBUTEROL SULFATE (2.5 MG/3ML) 0.083% IN NEBU: 2.5000 mg | INHALATION_SOLUTION | Freq: Four times a day (QID) | RESPIRATORY_TRACT | Status: DC | PRN

## 2015-11-26 MED ORDER — FUROSEMIDE 20 MG PO TABS: 20.0000 mg | ORAL_TABLET | Freq: Every day | ORAL | Status: DC

## 2015-11-26 NOTE — Progress Notes (Signed)
Name: Jonathon Graham   MRN: [id number]    DOB: 17-Apr-1956   Date:11/26/2015       Progress Note  Subjective  Chief Complaint  Chief Complaint  Patient presents with  . Leg Swelling    HPI Here c/o L leg pain and swelling.  Leg swelling for about 6 months.  Always L leg.  No swelling of R.  He has had 2 Doppler studies to R/o DVT and both were neg.  Last US was 6-8 weeks ago.  Leg swelling worse and more painful with tingling and aches and burns for past month  He stopped Victoza after only a few doses because he felt it made him drowsy.  BSs at home have run 130-240, but he checks only irregularly.  He needs refills of Metformin, Lasix and Albuterol solution for nebulizer.  No problem-specific assessment & plan notes found for this encounter.   Past Medical History  Diagnosis Date  . Hypertension   . Fibromyalgia, primary   . Diabetes mellitus without complication (HCC)   . Prostate disorder   . Benign prostatic hyperplasia (BPH) with urinary urgency     nodular  . Depression   . Arthritis   . Essential hypertension   . Obesity     History reviewed. No pertinent past surgical history.  Family History  Problem Relation Age of Onset  . Heart disease Father   . Diabetes Mellitus II Sister   . Myasthenia gravis Sister   . Diabetes Mellitus II Brother     Social History   Social History  . Marital Status: Married    Spouse Name: N/A  . Number of Children: N/A  . Years of Education: N/A   Occupational History  . Not on file.   Social History Main Topics  . Smoking status: Current Some Day Smoker    Types: Cigarettes    Last Attempt to Quit: 04/04/2015  . Smokeless tobacco: Never Used     Comment: 2 packs a week  . Alcohol Use: No  . Drug Use: No  . Sexual Activity: Not on file   Other Topics Concern  . Not on file   Social History Narrative     Current outpatient prescriptions:  .  albuterol (PROVENTIL) (2.5 MG/3ML) 0.083% nebulizer solution, Take 3  mLs (2.5 mg total) by nebulization every 6 (six) hours as needed for wheezing or shortness of breath., Disp: 150 mL, Rfl: 1 .  aspirin 81 MG tablet, Take 81 mg by mouth daily. Takes 4 81mg  aspirin daily., Disp: , Rfl:  .  calcium-vitamin D (OSCAL 500/200 D-3) 500-200 MG-UNIT tablet, Take 1 tablet by mouth 2 (two) times daily., Disp: 60 tablet, Rfl: 1 .  furosemide (LASIX) 20 MG tablet, Take 1 tablet (20 mg total) by mouth daily., Disp: 30 tablet, Rfl: 0 .  hydrochlorothiazide (MICROZIDE) 12.5 MG capsule, TAKE ONE CAPSULE BY MOUTH ONCE DAILY, Disp: 30 capsule, Rfl: 2 .  Insulin Pen Needle (PEN NEEDLES) 31G X 5 MM MISC, 1 each by Does not apply route daily., Disp: 100 each, Rfl: 5 .  meloxicam (MOBIC) 7.5 MG tablet, Take 1 tablet (7.5 mg total) by mouth daily., Disp: 30 tablet, Rfl: 0 .  metFORMIN (GLUCOPHAGE) 1000 MG tablet, Take 1 tablet (1,000 mg total) by mouth 2 (two) times daily with a meal., Disp: 180 tablet, Rfl: 3 .  tamsulosin (FLOMAX) 0.4 MG CAPS capsule, Take 0.4 mg by mouth., Disp: , Rfl:   Current facility-administered medications:  .  ipratropium-albuterol (DUONEB) 0.5-2.5 (3) MG/3ML nebulizer solution 3 mL, 3 mL, Nebulization, Q6H, Amy Lauren Krebs, NP  Allergies  Allergen Reactions  . Penicillins Anaphylaxis     Review of Systems  Constitutional: Negative for fever, chills, weight loss and malaise/fatigue.  Eyes: Negative for blurred vision and double vision.  Respiratory: Positive for shortness of breath. Negative for cough and wheezing.   Cardiovascular: Positive for leg swelling (L). Negative for chest pain and palpitations.  Gastrointestinal: Negative for heartburn, abdominal pain and blood in stool.  Genitourinary: Negative for dysuria, urgency and frequency.  Musculoskeletal:       L leg pain from knee down  Neurological: Positive for weakness. Negative for headaches.       Tingling, burning pain in L leg from knee down      Objective  Filed Vitals:    11/26/15 0819  BP: 129/76  Pulse: 68  Temp: 98.4 F (36.9 C)  TempSrc: Oral  Resp: 16  Height: 5\' 7"  (1.702 m)  Weight: 340 lb (154.223 kg)    Physical Exam  Constitutional: He is oriented to person, place, and time and well-developed, well-nourished, and in no distress. No distress.  HENT:  Head: Normocephalic and atraumatic.  Eyes: Conjunctivae and EOM are normal. Pupils are equal, round, and reactive to light. No scleral icterus.  Neck: Normal range of motion. Neck supple. Carotid bruit is not present. No thyromegaly present.  Cardiovascular: Normal rate, regular rhythm and normal heart sounds.  Exam reveals no gallop and no friction rub.   No murmur heard. Pulmonary/Chest: Effort normal and breath sounds normal. No respiratory distress. He has no wheezes. He has no rales.  Abdominal: Soft. Bowel sounds are normal. He exhibits no distension, no abdominal bruit and no mass. There is no tenderness.  obese  Musculoskeletal: He exhibits edema (1+ edema od L lower leg; trace edema of R lower leg.).  Lymphadenopathy:    He has no cervical adenopathy.  Neurological: He is alert and oriented to person, place, and time.  C/o tingling, burning feeling in both lower legs from knees to feet; L>R  Vitals reviewed.      No results found for this or any previous visit (from the past 2160 hour(s)).   Assessment & Plan  Problem List Items Addressed This Visit    None      No orders of the defined types were placed in this encounter.

## 2015-12-02 ENCOUNTER — Ambulatory Visit: Payer: BC Managed Care – PPO | Admitting: Family Medicine

## 2015-12-30 ENCOUNTER — Encounter: Payer: Self-pay | Admitting: Family Medicine

## 2015-12-30 ENCOUNTER — Ambulatory Visit: Payer: BC Managed Care – PPO | Admitting: Family Medicine

## 2016-01-05 ENCOUNTER — Ambulatory Visit (INDEPENDENT_AMBULATORY_CARE_PROVIDER_SITE_OTHER): Payer: BC Managed Care – PPO | Admitting: Family Medicine

## 2016-01-05 VITALS — BP 122/67 | HR 72 | Temp 98.4°F | Resp 16 | Ht 67.0 in | Wt 348.6 lb

## 2016-01-05 DIAGNOSIS — R600 Localized edema: Secondary | ICD-10-CM

## 2016-01-05 DIAGNOSIS — E1142 Type 2 diabetes mellitus with diabetic polyneuropathy: Secondary | ICD-10-CM | POA: Diagnosis not present

## 2016-01-05 DIAGNOSIS — M1712 Unilateral primary osteoarthritis, left knee: Secondary | ICD-10-CM | POA: Diagnosis not present

## 2016-01-05 DIAGNOSIS — Z6841 Body Mass Index (BMI) 40.0 and over, adult: Secondary | ICD-10-CM

## 2016-01-05 DIAGNOSIS — M1711 Unilateral primary osteoarthritis, right knee: Secondary | ICD-10-CM

## 2016-01-05 DIAGNOSIS — G4733 Obstructive sleep apnea (adult) (pediatric): Secondary | ICD-10-CM

## 2016-01-05 LAB — POCT UA - MICROALBUMIN

## 2016-01-05 LAB — POCT GLYCOSYLATED HEMOGLOBIN (HGB A1C): Hemoglobin A1C: 6.4

## 2016-01-05 MED ORDER — MEDICAL COMPRESSION SOCKS MISC: 1.0000 | Freq: Every day | 0 refills | Status: DC

## 2016-01-05 MED ORDER — DICLOFENAC SODIUM 1 % TD GEL: 2.0000 g | Freq: Four times a day (QID) | TRANSDERMAL | 2 refills | Status: DC

## 2016-01-05 MED ORDER — NALTREXONE-BUPROPION HCL ER 8-90 MG PO TB12: 2.0000 | ORAL_TABLET | Freq: Two times a day (BID) | ORAL | 2 refills | Status: DC

## 2016-01-05 MED ORDER — ACETAMINOPHEN 500 MG PO TABS: 500.0000 mg | ORAL_TABLET | Freq: Three times a day (TID) | ORAL | 0 refills | Status: DC | PRN

## 2016-01-05 NOTE — Assessment & Plan Note (Signed)
Pt may need new sleep study or auto-titration. Will contact [facility] to determine if they were DME carrier. Consider referral to sleep medicine.

## 2016-01-05 NOTE — Assessment & Plan Note (Signed)
Likely 2/2 weight and venous insufficiency. Encouraged compression stocking, adequate fluids, and elevating feet daily. Continue lasix PRN.

## 2016-01-05 NOTE — Assessment & Plan Note (Signed)
Well controlled. UA micro-albumin done today. Has stopped ACE for renal protection- will add next visit after checking labs.

## 2016-01-05 NOTE — Progress Notes (Signed)
Subjective:    Patient ID: Jonathon Graham, male    DOB: Jun 16, 1955, 60 y.o.   MRN: [id number]  HPI: Jonathon PaulaKeith Boxell is a 60 y.o. male presenting on 01/05/2016 for Knee Pain (left knee hurts more as compare to Right with ROM worst at work 3 to 4 times a day has sharp pain)   HPI  Pt presents for knee pain. He has a history of osteoarthritis in the knee. He has had issues with leg swelling. Multiple negative venous dopplers. Thought to be due to venous insufficieny, OSA, and weight. He has appt for [facility]opedics on Monday at [facility]. Has had joint injections before. Saw Dr. Juanetta GoslingHawkins last month and was started on Gabapentin taking 300mg  TID. Taking 4 baby aspirin at night.  Would like to discuss weight loss. Has a physical jobs- walks a lot. Has cut out fried foods. Cut out beef and pork. Eats mainly vegetables.  Has trouble with eating well due to grab and go. Does drink juice.  Prediabetes.  Having issues with CPAP- has sleep study daily. Got his CPAP from his brother-in-law. The site closed down that provided. Wearing CPAP almost every night. Wears it all night when he wears it. Is getting up to go the bathroom at night. Is worried it might need to be titrated.   Past Medical History:  Diagnosis Date  . Arthritis   . Benign prostatic hyperplasia (BPH) with urinary urgency    nodular  . Depression   . Diabetes mellitus without complication (HCC)   . Essential hypertension   . Fibromyalgia, primary   . Hypertension   . Obesity   . Prostate disorder     Current Outpatient Prescriptions on File Prior to Visit  Medication Sig  . albuterol (PROVENTIL) (2.5 MG/3ML) 0.083% nebulizer solution Take 3 mLs (2.5 mg total) by nebulization every 6 (six) hours as needed for wheezing or shortness of breath.  Marland Kitchen. aspirin 81 MG tablet Take 81 mg by mouth daily. Takes 4 81mg  aspirin daily.  . calcium-vitamin D (OSCAL 500/200 D-3) 500-200 MG-UNIT tablet Take 1 tablet by mouth 2 (two) times daily.  .  furosemide (LASIX) 20 MG tablet Take 1 tablet (20 mg total) by mouth daily.  Marland Kitchen. gabapentin (NEURONTIN) 100 MG capsule Take 3 capsules, three times a day.  Follow written instructions to taper up to this dose.  . meloxicam (MOBIC) 7.5 MG tablet Take 1 tablet (7.5 mg total) by mouth daily.  . metFORMIN (GLUCOPHAGE) 1000 MG tablet Take 1 tablet (1,000 mg total) by mouth 2 (two) times daily with a meal.  . tamsulosin (FLOMAX) 0.4 MG CAPS capsule Take 0.4 mg by mouth.   Current Facility-Administered Medications on File Prior to Visit  Medication  . ipratropium-albuterol (DUONEB) 0.5-2.5 (3) MG/3ML nebulizer solution 3 mL    Review of Systems  Constitutional: Negative for chills and fever.  HENT: Negative.   Respiratory: Negative for chest tightness, shortness of breath and wheezing.   Cardiovascular: Positive for leg swelling. Negative for chest pain and palpitations.  Gastrointestinal: Negative for abdominal pain, nausea and vomiting.  Endocrine: Negative.   Genitourinary: Negative for discharge, dysuria, penile pain, testicular pain and urgency.  Musculoskeletal: Positive for arthralgias. Negative for back pain and joint swelling.  Skin: Negative.   Neurological: Negative for dizziness, weakness, numbness and headaches.  Psychiatric/Behavioral: Positive for sleep disturbance. Negative for dysphoric mood.   Per HPI unless specifically indicated above     Objective:    BP 122/67 (  BP Location: Left Arm, Patient Position: Sitting, Cuff Size: Large)   Pulse 72   Temp 98.4 F (36.9 C) (Oral)   Resp 16   Ht  (1.702 m)   Wt (!) 348 lb 9.6 oz (158.1 kg)   BMI 54.60 kg/m   Wt Readings from Last 3 Encounters:  01/05/16 (!) 348 lb 9.6 oz (158.1 kg)  11/26/15 (!) 340 lb (154.2 kg)  06/15/15 (!) 340 lb (154.2 kg)    Physical Exam  Constitutional: He is oriented to person, place, and time. He appears well-developed and well-nourished. No distress.  HENT:  Head: Normocephalic and  atraumatic.  Neck: Neck supple. No thyromegaly present.  Cardiovascular: Normal rate, regular rhythm and normal heart sounds.  Exam reveals no gallop and no friction rub.   No murmur heard. Pulmonary/Chest: Effort normal and breath sounds normal. He has no wheezes.  Abdominal: Soft. Bowel sounds are normal. He exhibits no distension. There is no tenderness. There is no rebound.  Musculoskeletal: Normal range of motion.       Right knee: Normal.       Left knee: He exhibits no swelling, no erythema, no LCL laxity, no bony tenderness, normal meniscus and no MCL laxity. Tenderness found. Medial joint line and lateral joint line tenderness noted.       Right lower leg: He exhibits edema (+1).       Left lower leg: He exhibits edema (+1).  Neurological: He is alert and oriented to person, place, and time. He has normal reflexes.  Skin: Skin is warm and dry. No rash noted. No erythema.  Psychiatric: He has a normal mood and affect. His behavior is normal. Thought content normal.   Results for orders placed or performed in visit on 01/05/16  POCT HgB A1C  Result Value Ref Range   Hemoglobin A1C 6.4   POCT UA - Microalbumin  Result Value Ref Range   Microalbumin Ur, POC o mg/L   Creatinine, POC  mg/dL   Albumin/Creatinine Ratio, Urine, POC        Assessment & Plan:   Problem List Items Addressed This Visit      Respiratory   Severe obstructive sleep apnea    Pt may need new sleep study or auto-titration. Will contact [facility] to determine if they were DME carrier. Consider referral to sleep medicine.         Nervous and Auditory   Peripheral sensory neuropathy due to type 2 diabetes mellitus (HCC) - Primary   Relevant Medications   Naltrexone-Bupropion HCl ER 8-90 MG TB12   Other Relevant Orders   POCT HgB A1C (Completed)     Musculoskeletal and Integument   Osteoarthritis of left knee    Knee pain is OSA related. Stressed need for orthopedics follow-up for joint injections at  this time. Trial of tylenol and voltaren gel for pain. Return if not improving.       Relevant Medications   acetaminophen (TYLENOL) 500 MG tablet   diclofenac sodium (VOLTAREN) 1 % GEL     Other   Pedal edema    Likely 2/2 weight and venous insufficiency. Encouraged compression stocking, adequate fluids, and elevating feet daily. Continue lasix PRN.       BMI 50.0-59.9, adult [facility])    Reviewed health risks and conditions associated with weight. Pt would like to try a weight loss medication- trial of contrave. Risks vs. Benefits reviewed. Also discussed bariatric surgery- information on seminars at [facility] and [place] long given.  Recheck 6 weeks.       Relevant Medications   Naltrexone-Bupropion HCl ER 8-90 MG TB12    Other Visit Diagnoses   None.     Meds ordered this encounter  Medications  . acetaminophen (TYLENOL) 500 MG tablet    Sig: Take 1 tablet (500 mg total) by mouth every 8 (eight) hours as needed.    Dispense:  30 tablet    Refill:  0    Order Specific Question:   Supervising Provider    Answer:   Janeann Forehand [phone]  . diclofenac sodium (VOLTAREN) 1 % GEL    Sig: Apply 2 g topically 4 (four) times daily.    Dispense:  100 g    Refill:  2    Order Specific Question:   Supervising Provider    Answer:   Janeann Forehand [[id number]]  . Naltrexone-Bupropion HCl ER 8-90 MG TB12    Sig: Take 2 tablets by mouth 2 (two) times daily. Titrate as directed.    Dispense:  120 tablet    Refill:  2    Order Specific Question:   Supervising Provider    Answer:   Janeann Forehand [phone]  . Elastic Bandages & Supports (MEDICAL COMPRESSION SOCKS) MISC    Sig: 1 each by Does not apply route daily.    Dispense:  1 each    Refill:  0    Order Specific Question:   Supervising Provider    Answer:   Janeann Forehand [[id number]]      Follow up plan: Return in about 6 weeks (around 02/16/2016).

## 2016-01-05 NOTE — Assessment & Plan Note (Signed)
Knee pain is OSA related. Stressed need for orthopedics follow-up for joint injections at this time. Trial of tylenol and voltaren gel for pain. Return if not improving.

## 2016-01-05 NOTE — Assessment & Plan Note (Addendum)
Reviewed health risks and conditions associated with weight. Pt would like to try a weight loss medication- trial of contrave. Risks vs. Benefits reviewed. Also discussed bariatric surgery- information [facility] semin[facility] and [facility] long given.  Recheck 6 weeks.

## 2016-01-05 NOTE — Patient Instructions (Addendum)
[facility] - Join us for a weight-loss seminar in [place], or watch our online webinar; for more information, call [phone].  [facility] - Join us for a weight-loss seminar in [place]; for more information, call the [place] at [phone].  [email or web address]  Contrave: Week 1: 1 pill AM Week 2: 1 pill AM and 1 pill PM Week 3: 2 pills AM and 1 pill PM Week 4: 2 pills AM and 2 pills PM.  Your BMI is considered obese.  Losing weight will help you maintain health throughout your lifespan and prevent the progression of chronic health conditions.  Please try to meet the goal of 150 minutes of exercise per week.  This is generally 30-40 minutes of moderate activity 3-4 times per week. Consider a calorie goal of 1800 per day. Eat mainly fruits, vegetables, and lean proteins (chicken or fish).  A great resource for healthy living and weight loss is Choosemyplate.gov

## 2016-01-06 ENCOUNTER — Telehealth: Payer: Self-pay | Admitting: Family Medicine

## 2016-01-06 LAB — COMPLETE METABOLIC PANEL WITH GFR
ALT: 15 U/L (ref 9–46)
AST: 16 U/L (ref 10–35)
Albumin: 4 g/dL (ref 3.6–5.1)
Alkaline Phosphatase: 45 U/L (ref 40–115)
BUN: 18 mg/dL (ref 7–25)
CO2: 32 mmol/L — ABNORMAL HIGH (ref 20–31)
Calcium: 9 mg/dL (ref 8.6–10.3)
Chloride: 100 mmol/L (ref 98–110)
Creat: 0.91 mg/dL (ref 0.70–1.33)
GFR, Est African American: >89 mL/min (ref >=60)
GFR, Est Non African American: >89 mL/min (ref >=60)
Glucose, Bld: 121 mg/dL — ABNORMAL HIGH (ref 65–99)
Potassium: 4.4 mmol/L (ref 3.5–5.3)
Sodium: 141 mmol/L (ref 135–146)
Total Bilirubin: 0.4 mg/dL (ref 0.2–1.2)
Total Protein: 6.6 g/dL (ref 6.1–8.1)

## 2016-01-06 LAB — LIPID PANEL
Cholesterol: 153 mg/dL (ref 125–200)
HDL: 45 mg/dL (ref >=40)
LDL Cholesterol: 78 mg/dL (ref <130)
Total CHOL/HDL Ratio: 3.4 Ratio (ref <=5.0)
Triglycerides: 150 mg/dL — ABNORMAL HIGH (ref <150)
VLDL: 30 mg/dL (ref <30)

## 2016-01-06 NOTE — Telephone Encounter (Signed)
error 

## 2016-01-12 DIAGNOSIS — J449 Chronic obstructive pulmonary disease, unspecified: Secondary | ICD-10-CM | POA: Diagnosis not present

## 2016-01-12 DIAGNOSIS — M10032 Idiopathic gout, left wrist: Secondary | ICD-10-CM | POA: Insufficient documentation

## 2016-01-12 DIAGNOSIS — E119 Type 2 diabetes mellitus without complications: Secondary | ICD-10-CM | POA: Diagnosis not present

## 2016-01-12 DIAGNOSIS — F17210 Nicotine dependence, cigarettes, uncomplicated: Secondary | ICD-10-CM | POA: Insufficient documentation

## 2016-01-12 DIAGNOSIS — Z7984 Long term (current) use of oral hypoglycemic drugs: Secondary | ICD-10-CM | POA: Insufficient documentation

## 2016-01-12 DIAGNOSIS — I10 Essential (primary) hypertension: Secondary | ICD-10-CM | POA: Insufficient documentation

## 2016-01-12 DIAGNOSIS — Z7982 Long term (current) use of aspirin: Secondary | ICD-10-CM | POA: Insufficient documentation

## 2016-01-12 DIAGNOSIS — Z79899 Other long term (current) drug therapy: Secondary | ICD-10-CM | POA: Insufficient documentation

## 2016-01-12 DIAGNOSIS — Z8546 Personal history of malignant neoplasm of prostate: Secondary | ICD-10-CM | POA: Insufficient documentation

## 2016-01-12 DIAGNOSIS — M79602 Pain in left arm: Secondary | ICD-10-CM | POA: Diagnosis present

## 2016-01-13 ENCOUNTER — Other Ambulatory Visit: Payer: Self-pay | Admitting: Family Medicine

## 2016-01-13 ENCOUNTER — Emergency Department
Admission: EM | Admit: 2016-01-13 | Discharge: 2016-01-13 | Disposition: A | Discharge status: Home / Self Care | Payer: BC Managed Care – PPO | Attending: Emergency Medicine | Admitting: Emergency Medicine

## 2016-01-13 DIAGNOSIS — M109 Gout, unspecified: Secondary | ICD-10-CM

## 2016-01-13 LAB — BASIC METABOLIC PANEL
Anion gap: 6 (ref 5–15)
BUN: 21 mg/dL — ABNORMAL HIGH (ref 6–20)
CO2: 33 mmol/L — ABNORMAL HIGH (ref 22–32)
Calcium: 8.9 mg/dL (ref 8.9–10.3)
Chloride: 101 mmol/L (ref 101–111)
Creatinine, Ser: 0.79 mg/dL (ref 0.61–1.24)
GFR calc Af Amer: >60 mL/min (ref >60)
GFR calc non Af Amer: >60 mL/min (ref >60)
Glucose, Bld: 141 mg/dL — ABNORMAL HIGH (ref 65–99)
Potassium: 4.1 mmol/L (ref 3.5–5.1)
Sodium: 140 mmol/L (ref 135–145)

## 2016-01-13 LAB — CBC
HCT: 43.6 % (ref 40.0–52.0)
Hemoglobin: 14.5 g/dL (ref 13.0–18.0)
MCH: 28.6 pg (ref 26.0–34.0)
MCHC: 33.2 g/dL (ref 32.0–36.0)
MCV: 86 fL (ref 80.0–100.0)
Platelets: 196 10*3/uL (ref 150–440)
RBC: 5.07 MIL/uL (ref 4.40–5.90)
RDW: 16.4 % — ABNORMAL HIGH (ref 11.5–14.5)
WBC: 7.8 10*3/uL (ref 3.8–10.6)

## 2016-01-13 LAB — TROPONIN I: Troponin I: <0.03 ng/mL (ref <0.03)

## 2016-01-13 LAB — URIC ACID: Uric Acid, Serum: 6.3 mg/dL (ref 4.4–7.6)

## 2016-01-13 MED ORDER — INDOMETHACIN 50 MG PO CAPS
50.0000 mg | ORAL_CAPSULE | Freq: Once | ORAL | Status: AC
  Administered 2016-01-13: 50 mg via ORAL
  Filled 2016-01-13: qty 1

## 2016-01-13 MED ORDER — ATORVASTATIN CALCIUM 20 MG PO TABS: 20.0000 mg | ORAL_TABLET | Freq: Every day | ORAL | 3 refills | Status: DC

## 2016-01-13 MED ORDER — INDOMETHACIN 50 MG PO CAPS: 50.0000 mg | ORAL_CAPSULE | Freq: Three times a day (TID) | ORAL | 0 refills | Status: DC | PRN

## 2016-01-13 MED ORDER — COLCHICINE 0.6 MG PO TABS
0.6000 mg | ORAL_TABLET | Freq: Once | ORAL | Status: AC
  Administered 2016-01-13: 0.6 mg via ORAL
  Filled 2016-01-13: qty 1

## 2016-01-13 NOTE — ED Notes (Signed)
Pt. Denies knowledge of injury to wrist, denies change in sleeping pattern, denies change in mechanism at work

## 2016-01-13 NOTE — ED Notes (Signed)
EDP at bedside  

## 2016-01-13 NOTE — ED Triage Notes (Signed)
Patient ambulatory to triage with steady gait, without difficulty or distress noted; pt reports left hand/wrist pain radiating up arm since the weekened; denies any known injuries, denies any accomp symptoms, denies hx of same

## 2016-01-13 NOTE — ED Notes (Addendum)
Left and right wrist appear to be equal, without signs of cyanosis or bleeding, radial pulses equal and +2 bilaterally. ROM not impaired, only limited by soreness.

## 2016-01-13 NOTE — ED Provider Notes (Signed)
[facility] Emergency Department Provider Note  ____________________________________________   None    (approximate)  I have reviewed the triage vital signs and the nursing notes.   HISTORY  Chief Complaint Arm Pain   HPI Jonathon Graham is a 60 y.o. male presents with nontraumatic 8 out of 10 left wrist pain worse with movement with onset 5 days ago. Patient denies any fever no nausea or vomiting. Patient admits to previous history of gout" in my toe" with similar symptoms.   Past Medical History:  Diagnosis Date  . Arthritis   . Benign prostatic hyperplasia (BPH) with urinary urgency    nodular  . Depression   . Diabetes mellitus without complication (HCC)   . Essential hypertension   . Fibromyalgia, primary   . Hypertension   . Obesity   . Prostate disorder     Patient Active Problem List   Diagnosis Date Noted  . Peripheral sensory neuropathy due to type 2 diabetes mellitus (HCC) 11/26/2015  . COPD (chronic obstructive pulmonary disease) (HCC) 11/26/2015  . Severe obstructive sleep apnea 06/15/2015  . Osteoarthritis of left knee 06/15/2015  . Tobacco use 06/15/2015  . BMI 50.0-59.9, adult (HCC) 05/26/2015  . Type 2 diabetes mellitus (HCC) 05/04/2015  . Pedal edema 03/16/2015  . Leg pain, posterior 03/16/2015    No past surgical history on file.  Prior to Admission medications   Medication Sig Start Date End Date Taking? Authorizing Provider  acetaminophen (TYLENOL) 500 MG tablet Take 1 tablet (500 mg total) by mouth every 8 (eight) hours as needed. 01/05/16   Amy Rusty Aus, NP  albuterol (PROVENTIL) (2.5 MG/3ML) 0.083% nebulizer solution Take 3 mLs (2.5 mg total) by nebulization every 6 (six) hours as needed for wheezing or shortness of breath. 11/26/15   Janeann Forehand., MD  aspirin 81 MG tablet Take 81 mg by mouth daily. Takes 4 81mg  aspirin daily.    Historical Provider, MD  calcium-vitamin D (OSCAL 500/200 D-3) 500-200 MG-UNIT  tablet Take 1 tablet by mouth 2 (two) times daily. 05/06/15   Amy Rusty Aus, NP  diclofenac sodium (VOLTAREN) 1 % GEL Apply 2 g topically 4 (four) times daily. 01/05/16   Amy Rusty Aus, NP  Elastic Bandages & Supports (MEDICAL COMPRESSION SOCKS) MISC 1 each by Does not apply route daily. 01/05/16   Amy Rusty Aus, NP  furosemide (LASIX) 20 MG tablet Take 1 tablet (20 mg total) by mouth daily. 11/26/15   Janeann Forehand., MD  gabapentin (NEURONTIN) 100 MG capsule Take 3 capsules, three times a day.  Follow written instructions to taper up to this dose. 11/26/15   Janeann Forehand., MD  meloxicam (MOBIC) 7.5 MG tablet Take 1 tablet (7.5 mg total) by mouth daily. 05/04/15   Amy Rusty Aus, NP  metFORMIN (GLUCOPHAGE) 1000 MG tablet Take 1 tablet (1,000 mg total) by mouth 2 (two) times daily with a meal. 11/26/15   Janeann Forehand., MD  Naltrexone-Bupropion HCl ER 8-90 MG TB12 Take 2 tablets by mouth 2 (two) times daily. Titrate as directed. 01/05/16   Amy Rusty Aus, NP  tamsulosin (FLOMAX) 0.4 MG CAPS capsule Take 0.4 mg by mouth.    Historical Provider, MD    Allergies Penicillins  Family History  Problem Relation Age of Onset  . Heart disease Father   . Diabetes Mellitus II Sister   . Myasthenia gravis Sister   . Diabetes Mellitus II Brother  Social History Social History  Substance Use Topics  . Smoking status: Current Some Day Smoker    Types: Cigarettes    Last attempt to quit: 04/04/2015  . Smokeless tobacco: Never Used     Comment: 2 packs a week  . Alcohol use No    Review of Systems Constitutional: No fever/chills Eyes: No visual changes. ENT: No sore throat. Cardiovascular: Denies chest pain. Respiratory: Denies shortness of breath. Gastrointestinal: No abdominal pain.  No nausea, no vomiting.  No diarrhea.  No constipation. Genitourinary: Negative for dysuria. Musculoskeletal: Negative for back pain.Positive for left wrist pain Skin: Negative for  rash. Neurological: Negative for headaches, focal weakness or numbness.  10-point ROS otherwise negative.  ____________________________________________   PHYSICAL EXAM:  VITAL SIGNS: ED Triage Vitals  Enc Vitals Group     BP 01/13/16 0004 131/77     Pulse Rate 01/13/16 0004 74     Resp 01/13/16 0004 20     Temp 01/13/16 0004 97.7 F (36.5 C)     Temp Source 01/13/16 0004 Oral     SpO2 01/13/16 0004 93 %     Weight 01/13/16 0002 (!) 320 lb (145.2 kg)     Height 01/13/16 0002 5\' 7"  (1.702 m)     Head Circumference --      Peak Flow --      Pain Score 01/13/16 0002 10     Pain Loc --      Pain Edu? --      Excl. in GC? --     Constitutional: Alert and oriented. Well appearing and in no acute distress. Eyes: Conjunctivae are normal. PERRL. EOMI. Head: Atraumatic. Ears:  Healthy appearing ear canals and TMs bilaterally Nose: No congestion/rhinnorhea. Mouth/Throat: Mucous membranes are moist.  Oropharynx non-erythematous. Neck: No stridor.  No meningeal signs.   Cardiovascular: Normal rate, regular rhythm. Good peripheral circulation. Grossly normal heart sounds.   Respiratory: Normal respiratory effort.  No retractions. Lungs CTAB. Gastrointestinal: Soft and nontender. No distention.  Musculoskeletal: Pain with passive and active range of motion of the left wrists  Neurologic:  Normal speech and language. No gross focal neurologic deficits are appreciated.  Skin:  Skin is warm, dry and intact. No rash noted. Psychiatric: Mood and affect are normal. Speech and behavior are normal.  ____________________________________________   LABS (all labs ordered are listed, but only abnormal results are displayed)  Labs Reviewed  BASIC METABOLIC PANEL - Abnormal; Notable for the following:       Result Value   CO2 33 (*)    Glucose, Bld 141 (*)    BUN 21 (*)    All other components within normal limits  CBC - Abnormal; Notable for the following:    RDW 16.4 (*)    All other  components within normal limits  TROPONIN I  URIC ACID    RADIOLOGY I, Mabel N Genna Casimir, personally viewed and evaluated these images (plain radiographs) as part of my medical decision making, as well as reviewing the written report by the radiologist.  No results found.   Procedures      INITIAL IMPRESSION / ASSESSMENT AND PLAN / ED COURSE  Pertinent labs & imaging results that were available during my care of the patient were reviewed by me and considered in my medical decision making (see chart for details).  Patient given indomethacin 50 mg colchicine 0.6 mg of resolution of pain  Clinical Course    ____________________________________________  FINAL CLINICAL IMPRESSION(S) / ED DIAGNOSES  Final diagnoses:  Acute gout of left wrist, unspecified cause     MEDICATIONS GIVEN DURING THIS VISIT:  Medications  indomethacin (INDOCIN) capsule 50 mg (50 mg Oral Given 01/13/16 0228)  colchicine tablet 0.6 mg (0.6 mg Oral Given 01/13/16 0226)     NEW OUTPATIENT MEDICATIONS STARTED DURING THIS VISIT:  New Prescriptions   No medications on file      Note:  This document was prepared using Dragon voice recognition software and may include unintentional dictation errors.    Darci Currentandolph N Mal Asher, MD 01/13/16 [phone]

## 2016-01-13 NOTE — ED Notes (Signed)
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 Pt. States he thinks the machine he was using last Friday may have twisted his arm. Friday he denies pain, Saturday was a little sore, Monday was worse, but then yesterday and today it's "really kicking my butt". Pt. States is "a bad lingering, aching pain"

## 2016-02-18 ENCOUNTER — Ambulatory Visit: Payer: BC Managed Care – PPO | Admitting: Family Medicine

## 2016-02-22 ENCOUNTER — Ambulatory Visit: Payer: BC Managed Care – PPO | Admitting: Family Medicine

## 2016-02-26 ENCOUNTER — Other Ambulatory Visit: Payer: Self-pay | Admitting: Family Medicine

## 2016-03-14 ENCOUNTER — Encounter: Payer: Self-pay | Admitting: Internal Medicine

## 2016-03-14 ENCOUNTER — Ambulatory Visit (INDEPENDENT_AMBULATORY_CARE_PROVIDER_SITE_OTHER): Payer: BC Managed Care – PPO | Admitting: Internal Medicine

## 2016-03-14 DIAGNOSIS — I10 Essential (primary) hypertension: Secondary | ICD-10-CM

## 2016-03-14 DIAGNOSIS — E7800 Pure hypercholesterolemia, unspecified: Secondary | ICD-10-CM

## 2016-03-14 DIAGNOSIS — Z6841 Body Mass Index (BMI) 40.0 and over, adult: Secondary | ICD-10-CM

## 2016-03-14 DIAGNOSIS — F32A Depression, unspecified: Secondary | ICD-10-CM | POA: Insufficient documentation

## 2016-03-14 DIAGNOSIS — M797 Fibromyalgia: Secondary | ICD-10-CM | POA: Insufficient documentation

## 2016-03-14 DIAGNOSIS — F329 Major depressive disorder, single episode, unspecified: Secondary | ICD-10-CM | POA: Insufficient documentation

## 2016-03-14 DIAGNOSIS — E1142 Type 2 diabetes mellitus with diabetic polyneuropathy: Secondary | ICD-10-CM

## 2016-03-14 DIAGNOSIS — G4701 Insomnia due to medical condition: Secondary | ICD-10-CM

## 2016-03-14 DIAGNOSIS — J439 Emphysema, unspecified: Secondary | ICD-10-CM

## 2016-03-14 DIAGNOSIS — G4700 Insomnia, unspecified: Secondary | ICD-10-CM | POA: Insufficient documentation

## 2016-03-14 DIAGNOSIS — R351 Nocturia: Secondary | ICD-10-CM

## 2016-03-14 DIAGNOSIS — M1990 Unspecified osteoarthritis, unspecified site: Secondary | ICD-10-CM

## 2016-03-14 DIAGNOSIS — F341 Dysthymic disorder: Secondary | ICD-10-CM

## 2016-03-14 DIAGNOSIS — N400 Enlarged prostate without lower urinary tract symptoms: Secondary | ICD-10-CM | POA: Insufficient documentation

## 2016-03-14 DIAGNOSIS — G4733 Obstructive sleep apnea (adult) (pediatric): Secondary | ICD-10-CM | POA: Diagnosis not present

## 2016-03-14 DIAGNOSIS — N401 Enlarged prostate with lower urinary tract symptoms: Secondary | ICD-10-CM

## 2016-03-14 DIAGNOSIS — E1169 Type 2 diabetes mellitus with other specified complication: Secondary | ICD-10-CM | POA: Insufficient documentation

## 2016-03-14 DIAGNOSIS — E1159 Type 2 diabetes mellitus with other circulatory complications: Secondary | ICD-10-CM | POA: Insufficient documentation

## 2016-03-14 DIAGNOSIS — E785 Hyperlipidemia, unspecified: Secondary | ICD-10-CM | POA: Insufficient documentation

## 2016-03-14 MED ORDER — HYDROCHLOROTHIAZIDE 12.5 MG PO CAPS: 12.5000 mg | ORAL_CAPSULE | Freq: Every day | ORAL | 5 refills | Status: DC

## 2016-03-14 NOTE — Assessment & Plan Note (Signed)
Well controlled on Metformin Microalbumin was done 12/2015 Encouraged him to consume a low carb, low fat diet and exercise to lose weight He declines flu and pneumovax today Encouraged him to make an appt for his vision screen Foot exam today- Continue Neurontin for neuropathy

## 2016-03-14 NOTE — Assessment & Plan Note (Signed)
On Contrave Encouraged him to work on diet and exercise  RTC in 3 weeks for weight check/med refill

## 2016-03-14 NOTE — Assessment & Plan Note (Signed)
Likely due to uncontrolled OSA Will look for sleep study records

## 2016-03-14 NOTE — Progress Notes (Signed)
HPI  Pt presents to the clinic today to establish care and for management of the conditions listed below.  Arthritis: "All over". He does feel like his back has been hurting more than usual and feels like his weight contributes to this. He takes Indomethacin, Tylenol and uses Voltern gel. He reports those medications work fairly well.    BPH: He tells me that he takes Cardura, but Flomax is on his med list. He is not quite sure which medication he is taking. He does get up multiple times per night to urinate.  Depression: Chronic. He reports he was recently started on Contrave to help with his mood and his weight. He denies anxiety, SI/HI.  DM 2 with peripheral neuropathy: He does not check his sugars. He reports his last A1C was checked 2 months ago, but he is not sure what it was. Per Epic, it was 6.4%, 12/2015. He is taking Metformin as prescribed and denies GI side effects. He does have peripheral neuropathy and takes Neurontin with fair relief. His last flu shot was 02/2015. He had his Pneumovax in 2009. He does not get yearly eye exams and he does not check his feet daily.  HTN: He reports he takes HCTZ and Lasix as prescribed. He does c/o of some BLE,.L>R. His BP today  is 128/84. ECG from 12/2015 reviewed.  Fibromyalgia: He has seen pain management in the past, but reports the medication was not effective. He takes Tylenol, Indomethacin and Neurontin with fair relief.  COPD: He has occasional shortness of breath. He uses the Albuterol inhaler once a week. He does continue to smoke.  HLD: His last LDL was 78, triglycerides 696150, 12/2015. He takes Lipitor as prescribed. He does not try to consume a low fat diet.  Obesity: He reports he is the heaviest he has ever been at 347.5 lbs. His BMI is 54.43. He was recently started on Contrave about 11 week ago.  Insomnia: He reports he is having trouble staying asleep at night. He thinks this is secondary to pain and frequent urination. He has not  taken anything OTC for this.  OSA: He denies this and reports he does not even know what it is. He tells me that he does not sleep with a CPAP machine.  Flu: 02/2015 Tetanus: 04/2013 Pneumovax: 02/2008 Colon Screening: never PSA Screening: 2016 Vision Screening: never Dentist: as needed  Past Medical History:  Diagnosis Date  . Arthritis   . Benign prostatic hyperplasia (BPH) with urinary urgency    nodular  . Depression   . Diabetes mellitus without complication (HCC)   . Essential hypertension   . Fibromyalgia, primary   . Hypertension   . Obesity   . Prostate disorder     Current Outpatient Prescriptions  Medication Sig Dispense Refill  . acetaminophen (TYLENOL) 500 MG tablet Take 1 tablet (500 mg total) by mouth every 8 (eight) hours as needed. 30 tablet 0  . albuterol (PROVENTIL) (2.5 MG/3ML) 0.083% nebulizer solution Take 3 mLs (2.5 mg total) by nebulization every 6 (six) hours as needed for wheezing or shortness of breath. 150 mL 6  . aspirin 81 MG tablet Take 81 mg by mouth daily. Takes 4 81mg  aspirin daily.    Marland Kitchen. atorvastatin (LIPITOR) 20 MG tablet Take 1 tablet (20 mg total) by mouth daily. 90 tablet 3  . diclofenac sodium (VOLTAREN) 1 % GEL Apply 2 g topically 4 (four) times daily. 100 g 2  . Elastic Bandages & Supports (MEDICAL COMPRESSION  SOCKS) MISC 1 each by Does not apply route daily. 1 each 0  . furosemide (LASIX) 20 MG tablet Take 1 tablet (20 mg total) by mouth daily. 30 tablet 6  . gabapentin (NEURONTIN) 100 MG capsule Take 3 capsules, three times a day.  Follow written instructions to taper up to this dose. 270 capsule 6  . hydrochlorothiazide (MICROZIDE) 12.5 MG capsule TAKE ONE CAPSULE BY MOUTH ONCE DAILY. NO FUTURE REFILL UNTIL SEEN 30 capsule 2  . indomethacin (INDOCIN) 50 MG capsule Take 1 capsule (50 mg total) by mouth 3 (three) times daily as needed. 15 capsule 0  . metFORMIN (GLUCOPHAGE) 1000 MG tablet Take 1 tablet (1,000 mg total) by mouth 2 (two)  times daily with a meal. 180 tablet 6  . tamsulosin (FLOMAX) 0.4 MG CAPS capsule Take 0.4 mg by mouth.    . Naltrexone-Bupropion HCl ER 8-90 MG TB12 Take 2 tablets by mouth 2 (two) times daily. Titrate as directed. (Patient not taking: Reported on 03/14/2016) 120 tablet 2   Current Facility-Administered Medications  Medication Dose Route Frequency Provider Last Rate Last Dose  . ipratropium-albuterol (DUONEB) 0.5-2.5 (3) MG/3ML nebulizer solution 3 mL  3 mL Nebulization Q6H Amy Lauren Krebs, NP        Allergies  Allergen Reactions  . Penicillins Anaphylaxis    Family History  Problem Relation Age of Onset  . Heart disease Father   . Diabetes Mellitus II Sister   . Myasthenia gravis Sister   . Fibrocystic breast disease Sister   . Hypertension Sister   . Diabetes Mellitus II Brother     Social History   Social History  . Marital status: Married    Spouse name: N/A  . Number of children: N/A  . Years of education: N/A   Occupational History  . Not on file.   Social History Main Topics  . Smoking status: Current Some Day Smoker    Types: Cigarettes    Last attempt to quit: 04/04/2015  . Smokeless tobacco: Never Used     Comment: 3-4 daily  . Alcohol use No  . Drug use: No  . Sexual activity: Not on file   Other Topics Concern  . Not on file   Social History Narrative  . No narrative on file    ROS:  Constitutional: Pt reports weight gain. Denies fever, malaise, fatigue, headache.  HEENT: Denies eye pain, eye redness, ear pain, ringing in the ears, wax buildup, runny nose, nasal congestion, bloody nose, or sore throat. Respiratory: Pt reports occasional shortness of breath. Denies difficulty breathing, cough or sputum production.   Cardiovascular: Denies chest pain, chest tightness, palpitations or swelling in the hands or feet.  Gastrointestinal: Denies abdominal pain, bloating, constipation, diarrhea or blood in the stool.  GU: Pt reports nocturia. Denies  frequency, urgency, pain with urination, blood in urine, odor or discharge. Musculoskeletal: Pt reports chronic muscle and joint pain. Denies decrease in range of motion, difficulty with gait, or joint swelling.  Skin: Denies redness, rashes, lesions or ulcercations.  Neurological: Pt reports burning sensation in feet. Denies dizziness, difficulty with memory, difficulty with speech or problems with balance and coordination.  Psych: Pt reports depression. Denies anxiety, SI/HI.  No other specific complaints in a complete review of systems (except as listed in HPI above).  PE:  BP 128/84   Pulse 78   Temp 98.1 F (36.7 C) (Oral)   Ht 5\' 7"  (1.702 m)   Wt (!) 347 lb 8 oz (  157.6 kg)   SpO2 97%   BMI 54.43 kg/m  Wt Readings from Last 3 Encounters:  03/14/16 (!) 347 lb 8 oz (157.6 kg)  01/13/16 (!) 320 lb (145.2 kg)  01/05/16 (!) 348 lb 9.6 oz (158.1 kg)    General: Appears his stated age, obese in NAD. Skin: Dry and intact. HEENT: Head: normal shape and size; Eyes: sclera white, no icterus, conjunctiva pink, PERRLA and EOMs intact;  Cardiovascular: Normal rate and rhythm. S1,S2 noted.  No murmur, rubs or gallops noted. Trace BLE edema. No carotid bruits noted. Pulmonary/Chest: Normal effort and positive vesicular breath sounds. No respiratory distress. No wheezes, rales or ronchi noted.  Musculoskeletal: Multiple tender points. No difficulty with gait.  Neurological: Alert and oriented. Sensation intact to BLE. Psychiatric: Mood and affect normal. Behavior is normal. Judgment and thought content normal.     BMET    Component Value Date/Time   NA 140 01/13/2016 0008   NA 136 07/03/2013 0416   K 4.1 01/13/2016 0008   K 4.0 07/03/2013 0416   CL 101 01/13/2016 0008   CL 99 07/03/2013 0416   CO2 33 (H) 01/13/2016 0008   CO2 33 (H) 07/03/2013 0416   GLUCOSE 141 (H) 01/13/2016 0008   GLUCOSE 128 (H) 07/03/2013 0416   BUN 21 (H) 01/13/2016 0008   BUN 18 07/03/2013 0416    CREATININE 0.79 01/13/2016 0008   CREATININE 0.91 01/05/2016 1025   CALCIUM 8.9 01/13/2016 0008   CALCIUM 8.8 07/03/2013 0416   GFRNONAA >60 01/13/2016 0008   GFRNONAA >89 01/05/2016 1025   GFRAA >60 01/13/2016 0008   GFRAA >89 01/05/2016 1025    Lipid Panel     Component Value Date/Time   CHOL 153 01/05/2016 0956   TRIG 150 (H) 01/05/2016 0956   HDL 45 01/05/2016 0956   CHOLHDL 3.4 01/05/2016 0956   VLDL 30 01/05/2016 0956   LDLCALC 78 01/05/2016 0956    CBC    Component Value Date/Time   WBC 7.8 01/13/2016 0008   RBC 5.07 01/13/2016 0008   HGB 14.5 01/13/2016 0008   HGB 14.4 08/19/2013 1031   HCT 43.6 01/13/2016 0008   HCT 43.7 08/19/2013 1031   PLT 196 01/13/2016 0008   PLT 196 08/19/2013 1031   MCV 86.0 01/13/2016 0008   MCV 87 08/19/2013 1031   MCH 28.6 01/13/2016 0008   MCHC 33.2 01/13/2016 0008   RDW 16.4 (H) 01/13/2016 0008   RDW 16.1 (H) 08/19/2013 1031   LYMPHSABS 1.5 08/19/2013 1031   MONOABS 0.5 08/19/2013 1031   EOSABS 0.1 08/19/2013 1031   BASOSABS 0.0 08/19/2013 1031    Hgb A1C Lab Results  Component Value Date   HGBA1C 6.4 01/05/2016     Assessment and Plan:

## 2016-03-14 NOTE — Assessment & Plan Note (Signed)
He will continue Flomax at this time

## 2016-03-14 NOTE — Assessment & Plan Note (Signed)
Controlled with Lasix and HCTZ Will monitor kidney function

## 2016-03-14 NOTE — Assessment & Plan Note (Signed)
Recently started on Contrave Will monitor

## 2016-03-14 NOTE — Assessment & Plan Note (Signed)
Multiple sites Discussed how weight loss could help take some pressure off his joints Continue Indomethacin, Tylenol and Voltaren gel

## 2016-03-14 NOTE — Assessment & Plan Note (Signed)
He will continue Tylenol, Indomethacin and Neurontin He is not interested in pain management at this time

## 2016-03-14 NOTE — Assessment & Plan Note (Signed)
He denies this as well Will try to find PFT's Continue Albuterol prn for now Encouraged pt to stop smoking

## 2016-03-14 NOTE — Assessment & Plan Note (Signed)
LDL at goal Continue Lipitor and ASA Encouraged him to consume a low fat diet

## 2016-03-14 NOTE — Assessment & Plan Note (Signed)
He denies this Will try to find sleep study records

## 2016-03-14 NOTE — Patient Instructions (Signed)

## 2016-04-10 ENCOUNTER — Ambulatory Visit: Payer: Self-pay | Admitting: Internal Medicine

## 2016-04-10 DIAGNOSIS — Z0289 Encounter for other administrative examinations: Secondary | ICD-10-CM

## 2016-04-10 NOTE — Progress Notes (Deleted)
Subjective:    Patient ID: Jonathon Graham, male    DOB: 07-09-1955, 60 y.o.   MRN: [id number]  HPI  Pt presents to the clinic today for follow up for weight check/med refill. He was started on Contrave by his previous PCP. His starting weight was 347.5, with a BMI of 54.43.  Review of Systems      Past Medical History:  Diagnosis Date  . Arthritis   . Benign prostatic hyperplasia (BPH) with urinary urgency    nodular  . COPD (chronic obstructive pulmonary disease) (HCC)   . Depression   . Diabetes mellitus without complication (HCC)   . Essential hypertension   . Fibromyalgia, primary   . Hyperlipidemia   . Hypertension   . Obesity     Current Outpatient Prescriptions  Medication Sig Dispense Refill  . acetaminophen (TYLENOL) 500 MG tablet Take 1 tablet (500 mg total) by mouth every 8 (eight) hours as needed. 30 tablet 0  . albuterol (PROVENTIL) (2.5 MG/3ML) 0.083% nebulizer solution Take 3 mLs (2.5 mg total) by nebulization every 6 (six) hours as needed for wheezing or shortness of breath. 150 mL 6  . aspirin 81 MG tablet Take 81 mg by mouth daily. Takes 4 81mg  aspirin daily.    Marland Kitchen. atorvastatin (LIPITOR) 20 MG tablet Take 1 tablet (20 mg total) by mouth daily. 90 tablet 3  . diclofenac sodium (VOLTAREN) 1 % GEL Apply 2 g topically 4 (four) times daily. 100 g 2  . Elastic Bandages & Supports (MEDICAL COMPRESSION SOCKS) MISC 1 each by Does not apply route daily. 1 each 0  . furosemide (LASIX) 20 MG tablet Take 1 tablet (20 mg total) by mouth daily. 30 tablet 6  . gabapentin (NEURONTIN) 100 MG capsule Take 3 capsules, three times a day.  Follow written instructions to taper up to this dose. 270 capsule 6  . hydrochlorothiazide (MICROZIDE) 12.5 MG capsule Take 1 capsule (12.5 mg total) by mouth daily. 30 capsule 5  . indomethacin (INDOCIN) 50 MG capsule Take 1 capsule (50 mg total) by mouth 3 (three) times daily as needed. 15 capsule 0  . metFORMIN (GLUCOPHAGE) 1000 MG tablet  Take 1 tablet (1,000 mg total) by mouth 2 (two) times daily with a meal. 180 tablet 6  . Naltrexone-Bupropion HCl ER 8-90 MG TB12 Take 2 tablets by mouth 2 (two) times daily. Titrate as directed. (Patient not taking: Reported on 03/14/2016) 120 tablet 2  . tamsulosin (FLOMAX) 0.4 MG CAPS capsule Take 0.4 mg by mouth.     Current Facility-Administered Medications  Medication Dose Route Frequency Provider Last Rate Last Dose  . ipratropium-albuterol (DUONEB) 0.5-2.5 (3) MG/3ML nebulizer solution 3 mL  3 mL Nebulization Q6H Amy Lauren Krebs, NP        Allergies  Allergen Reactions  . Penicillins Anaphylaxis    Family History  Problem Relation Age of Onset  . Heart disease Father   . Diabetes Mellitus II Sister   . Myasthenia gravis Sister   . Fibrocystic breast disease Sister   . Hypertension Sister   . Diabetes Mellitus II Brother     Social History   Social History  . Marital status: Married    Spouse name: N/A  . Number of children: N/A  . Years of education: N/A   Occupational History  . Not on file.   Social History Main Topics  . Smoking status: Current Some Day Smoker    Types: Cigarettes    Last attempt to  quit: 04/04/2015  . Smokeless tobacco: Never Used     Comment: 3-4 daily  . Alcohol use No  . Drug use: No  . Sexual activity: Yes   Other Topics Concern  . Not on file   Social History Narrative  . No narrative on file     Constitutional: Denies fever, malaise, fatigue, headache or abrupt weight changes.  HEENT: Denies eye pain, eye redness, ear pain, ringing in the ears, wax buildup, runny nose, nasal congestion, bloody nose, or sore throat. Respiratory: Denies difficulty breathing, shortness of breath, cough or sputum production.   Cardiovascular: Denies chest pain, chest tightness, palpitations or swelling in the hands or feet.  Gastrointestinal: Denies abdominal pain, bloating, constipation, diarrhea or blood in the stool.  GU: Denies urgency,  frequency, pain with urination, burning sensation, blood in urine, odor or discharge. Musculoskeletal: Denies decrease in range of motion, difficulty with gait, muscle pain or joint pain and swelling.  Skin: Denies redness, rashes, lesions or ulcercations.  Neurological: Denies dizziness, difficulty with memory, difficulty with speech or problems with balance and coordination.  Psych: Denies anxiety, depression, SI/HI.  No other specific complaints in a complete review of systems (except as listed in HPI above).  Objective:   Physical Exam        Assessment & Plan:

## 2016-04-11 ENCOUNTER — Telehealth: Payer: Self-pay | Admitting: Internal Medicine

## 2016-04-11 NOTE — Telephone Encounter (Signed)
Patient did not come in for their appointment on 04/10/16 for 3 week follow up. Please let me know if patient needs to be contacted immediately for follow up or no follow up needed.

## 2016-04-12 NOTE — Telephone Encounter (Signed)
Pt scheduled for 04/18/16  Pt aware

## 2016-04-12 NOTE — Telephone Encounter (Signed)
Yes pt needs to follow up

## 2016-04-17 ENCOUNTER — Ambulatory Visit: Payer: BC Managed Care – PPO | Admitting: Internal Medicine

## 2016-04-18 ENCOUNTER — Ambulatory Visit: Payer: BC Managed Care – PPO | Admitting: Internal Medicine

## 2016-04-18 NOTE — Progress Notes (Deleted)
Subjective:    Patient ID: Jonathon Graham, male    DOB: [birth year], 58 y.o.   MRN: [id number]  HPI  Pt presents to the clinic today to follow up abnormal weight gain. He was started on Contrave 01/2016 by his previous PCP. His starting weight was around 350 lbs. His weight today is.  Review of Systems       Past Medical History:  Diagnosis Date  . Arthritis   . Benign prostatic hyperplasia (BPH) with urinary urgency    nodular  . COPD (chronic obstructive pulmonary disease) (HCC)   . Depression   . Diabetes mellitus without complication (HCC)   . Essential hypertension   . Fibromyalgia, primary   . Hyperlipidemia   . Hypertension   . Obesity     Current Outpatient Prescriptions  Medication Sig Dispense Refill  . acetaminophen (TYLENOL) 500 MG tablet Take 1 tablet (500 mg total) by mouth every 8 (eight) hours as needed. 30 tablet 0  . albuterol (PROVENTIL) (2.5 MG/3ML) 0.083% nebulizer solution Take 3 mLs (2.5 mg total) by nebulization every 6 (six) hours as needed for wheezing or shortness of breath. 150 mL 6  . aspirin 81 MG tablet Take 81 mg by mouth daily. Takes 4 81mg  aspirin daily.    Marland Kitchen. atorvastatin (LIPITOR) 20 MG tablet Take 1 tablet (20 mg total) by mouth daily. 90 tablet 3  . diclofenac sodium (VOLTAREN) 1 % GEL Apply 2 g topically 4 (four) times daily. 100 g 2  . Elastic Bandages & Supports (MEDICAL COMPRESSION SOCKS) MISC 1 each by Does not apply route daily. 1 each 0  . furosemide (LASIX) 20 MG tablet Take 1 tablet (20 mg total) by mouth daily. 30 tablet 6  . gabapentin (NEURONTIN) 100 MG capsule Take 3 capsules, three times a day.  Follow written instructions to taper up to this dose. 270 capsule 6  . hydrochlorothiazide (MICROZIDE) 12.5 MG capsule Take 1 capsule (12.5 mg total) by mouth daily. 30 capsule 5  . indomethacin (INDOCIN) 50 MG capsule Take 1 capsule (50 mg total) by mouth 3 (three) times daily as needed. 15 capsule 0  . metFORMIN (GLUCOPHAGE) 1000 MG  tablet Take 1 tablet (1,000 mg total) by mouth 2 (two) times daily with a meal. 180 tablet 6  . Naltrexone-Bupropion HCl ER 8-90 MG TB12 Take 2 tablets by mouth 2 (two) times daily. Titrate as directed. (Patient not taking: Reported on 03/14/2016) 120 tablet 2  . tamsulosin (FLOMAX) 0.4 MG CAPS capsule Take 0.4 mg by mouth.     Current Facility-Administered Medications  Medication Dose Route Frequency Provider Last Rate Last Dose  . ipratropium-albuterol (DUONEB) 0.5-2.5 (3) MG/3ML nebulizer solution 3 mL  3 mL Nebulization Q6H Amy Lauren Krebs, NP        Allergies  Allergen Reactions  . Penicillins Anaphylaxis    Family History  Problem Relation Age of Onset  . Heart disease Father   . Diabetes Mellitus II Sister   . Myasthenia gravis Sister   . Fibrocystic breast disease Sister   . Hypertension Sister   . Diabetes Mellitus II Brother     Social History   Social History  . Marital status: Married    Spouse name: N/A  . Number of children: N/A  . Years of education: N/A   Occupational History  . Not on file.   Social History Main Topics  . Smoking status: Current Some Day Smoker    Types: Cigarettes    Last  attempt to quit: 04/04/2015  . Smokeless tobacco: Never Used     Comment: 3-4 daily  . Alcohol use No  . Drug use: No  . Sexual activity: Yes   Other Topics Concern  . Not on file   Social History Narrative  . No narrative on file     Constitutional: Denies fever, malaise, fatigue, headache or abrupt weight changes.  HEENT: Denies eye pain, eye redness, ear pain, ringing in the ears, wax buildup, runny nose, nasal congestion, bloody nose, or sore throat. Respiratory: Denies difficulty breathing, shortness of breath, cough or sputum production.   Cardiovascular: Denies chest pain, chest tightness, palpitations or swelling in the hands or feet.  Gastrointestinal: Denies abdominal pain, bloating, constipation, diarrhea or blood in the stool.  GU: Denies  urgency, frequency, pain with urination, burning sensation, blood in urine, odor or discharge. Musculoskeletal: Denies decrease in range of motion, difficulty with gait, muscle pain or joint pain and swelling.  Skin: Denies redness, rashes, lesions or ulcercations.  Neurological: Denies dizziness, difficulty with memory, difficulty with speech or problems with balance and coordination.  Psych: Denies anxiety, depression, SI/HI.  No other specific complaints in a complete review of systems (except as listed in HPI above).  Objective:   Physical Exam        Assessment & Plan:

## 2016-05-24 ENCOUNTER — Ambulatory Visit: Payer: Self-pay | Admitting: Internal Medicine

## 2016-05-26 ENCOUNTER — Ambulatory Visit (INDEPENDENT_AMBULATORY_CARE_PROVIDER_SITE_OTHER): Payer: BC Managed Care – PPO | Admitting: Internal Medicine

## 2016-05-26 ENCOUNTER — Encounter: Payer: Self-pay | Admitting: Internal Medicine

## 2016-05-26 VITALS — BP 130/78 | HR 67 | Temp 98.3°F | Wt 350.0 lb

## 2016-05-26 DIAGNOSIS — M150 Primary generalized (osteo)arthritis: Secondary | ICD-10-CM | POA: Diagnosis not present

## 2016-05-26 DIAGNOSIS — M797 Fibromyalgia: Secondary | ICD-10-CM

## 2016-05-26 DIAGNOSIS — M8949 Other hypertrophic osteoarthropathy, multiple sites: Secondary | ICD-10-CM

## 2016-05-26 DIAGNOSIS — M159 Polyosteoarthritis, unspecified: Secondary | ICD-10-CM

## 2016-05-26 MED ORDER — PHENTERMINE HCL 37.5 MG PO CAPS: 37.5000 mg | ORAL_CAPSULE | ORAL | 0 refills | Status: DC

## 2016-05-26 NOTE — Patient Instructions (Signed)
Obesity, Adult Introduction Obesity is having too much body fat. If you have a BMI of 30 or more, you are obese. BMI is a number that explains how much body fat you have. Obesity is often caused by taking in (consuming) more calories than your body uses. Obesity can cause serious health problems. Changing your lifestyle can help to treat obesity. Follow these instructions at home: Eating and drinking  Follow advice from your doctor about what to eat and drink. Your doctor may tell you to:  Cut down on (limit) fast foods, sweets, and processed snack foods.  Choose low-fat options. For example, choose low-fat milk instead of whole milk.  Eat 5 or more servings of fruits or vegetables every day.  Eat at home more often. This gives you more control over what you eat.  Choose healthy foods when you eat out.  Learn what a healthy portion size is. A portion size is the amount of a certain food that is healthy for you to eat at one time. This is different for each person.  Keep low-fat snacks available.  Avoid sugary drinks. These include soda, fruit juice, iced tea that is sweetened with sugar, and flavored milk.  Eat a healthy breakfast.  Drink enough water to keep your pee (urine) clear or pale yellow.  Do not go without eating for long periods of time (do not fast).  Do not go on popular or trendy diets (fad diets). Physical Activity  Exercise often, as told by your doctor. Ask your doctor:  What types of exercise are safe for you.  How often you should exercise.  Warm up and stretch before being active.  Do slow stretching after being active (cool down).  Rest between times of being active. Lifestyle  Limit how much time you spend in front of your TV, computer, or video game system (be less sedentary).  Find ways to reward yourself that do not involve food.  Limit alcohol intake to no more than 1 drink a day for nonpregnant women and 2 drinks a day for men. One drink  equals 12 oz of beer, 5 oz of wine, or 1 oz of hard liquor. General instructions  Keep a weight loss journal. This can help you keep track of:  The food that you eat.  The exercise that you do.  Take over-the-counter and prescription medicines only as told by your doctor.  Take vitamins and supplements only as told by your doctor.  Think about joining a support group. Your doctor may be able to help with this.  Keep all follow-up visits as told by your doctor. This is important. Contact a doctor if:  You cannot meet your weight loss goal after you have changed your diet and lifestyle for 6 weeks. This information is not intended to replace advice given to you by your health care provider. Make sure you discuss any questions you have with your health care provider. Document Released: 08/07/2011 Document Revised: 10/21/2015 Document Reviewed: 03/03/2015  2017 Elsevier  

## 2016-05-26 NOTE — Progress Notes (Signed)
Subjective:    Patient ID: Jonathon Graham, male    DOB: November 15, 1955, 60 y.o.   MRN: [id number]  HPI  Pt presents to the clinic today with c/o pain in his hands, legs and back. He describes the pain as achy. The pain seems worse first think in the morning. He also reports ongoing fatigue, lack of energy and weakness. He has OSA is wearing a CPAP but reports he still doesn't feel rested when he wakes up. He also has known arthritis and fibromyalgia. He has had xray's of his left knee, lumbar spine, left shoulder, left hand and cervical spine, all in Epic for review. He is taking Indomethacin, Ibuprofen, Tylenol and Voltaren gel which does not provide any relief. He reports he has tried Cymbalta and Amitriptyline in the past without any relief. He has been to pain management in the past as well, but reports the medications they offered him did not help. He is obese and knows his weight is a factor.  Review of Systems      Past Medical History:  Diagnosis Date  . Arthritis   . Benign prostatic hyperplasia (BPH) with urinary urgency    nodular  . COPD (chronic obstructive pulmonary disease) (HCC)   . Depression   . Diabetes mellitus without complication (HCC)   . Essential hypertension   . Fibromyalgia, primary   . Hyperlipidemia   . Hypertension   . Obesity     Current Outpatient Prescriptions  Medication Sig Dispense Refill  . acetaminophen (TYLENOL) 500 MG tablet Take 1 tablet (500 mg total) by mouth every 8 (eight) hours as needed. 30 tablet 0  . albuterol (PROVENTIL) (2.5 MG/3ML) 0.083% nebulizer solution Take 3 mLs (2.5 mg total) by nebulization every 6 (six) hours as needed for wheezing or shortness of breath. 150 mL 6  . aspirin 81 MG tablet Take 81 mg by mouth daily. Takes 4 81mg  aspirin daily.    Marland Kitchen atorvastatin (LIPITOR) 20 MG tablet Take 1 tablet (20 mg total) by mouth daily. 90 tablet 3  . diclofenac sodium (VOLTAREN) 1 % GEL Apply 2 g topically 4 (four) times daily. 100 g 2    . Elastic Bandages & Supports (MEDICAL COMPRESSION SOCKS) MISC 1 each by Does not apply route daily. 1 each 0  . furosemide (LASIX) 20 MG tablet Take 1 tablet (20 mg total) by mouth daily. 30 tablet 6  . gabapentin (NEURONTIN) 100 MG capsule Take 3 capsules, three times a day.  Follow written instructions to taper up to this dose. 270 capsule 6  . hydrochlorothiazide (MICROZIDE) 12.5 MG capsule Take 1 capsule (12.5 mg total) by mouth daily. 30 capsule 5  . indomethacin (INDOCIN) 50 MG capsule Take 1 capsule (50 mg total) by mouth 3 (three) times daily as needed. 15 capsule 0  . metFORMIN (GLUCOPHAGE) 1000 MG tablet Take 1 tablet (1,000 mg total) by mouth 2 (two) times daily with a meal. 180 tablet 6  . Naltrexone-Bupropion HCl ER 8-90 MG TB12 Take 2 tablets by mouth 2 (two) times daily. Titrate as directed. (Patient not taking: Reported on 03/14/2016) 120 tablet 2  . tamsulosin (FLOMAX) 0.4 MG CAPS capsule Take 0.4 mg by mouth.     Current Facility-Administered Medications  Medication Dose Route Frequency Provider Last Rate Last Dose  . ipratropium-albuterol (DUONEB) 0.5-2.5 (3) MG/3ML nebulizer solution 3 mL  3 mL Nebulization Q6H Amy Lauren Krebs, NP        Allergies  Allergen Reactions  . Penicillins  Anaphylaxis    Family History  Problem Relation Age of Onset  . Heart disease Father   . Diabetes Mellitus II Sister   . Myasthenia gravis Sister   . Fibrocystic breast disease Sister   . Hypertension Sister   . Diabetes Mellitus II Brother     Social History   Social History  . Marital status: Married    Spouse name: N/A  . Number of children: N/A  . Years of education: N/A   Occupational History  . Not on file.   Social History Main Topics  . Smoking status: Current Some Day Smoker    Types: Cigarettes    Last attempt to quit: 04/04/2015  . Smokeless tobacco: Never Used     Comment: 3-4 daily  . Alcohol use No  . Drug use: No  . Sexual activity: Yes   Other Topics  Concern  . Not on file   Social History Narrative  . No narrative on file     Constitutional: Pt reports weight gain. Denies fever, malaise, fatigue, headache.  Musculoskeletal: Pt reports multiple joint pains and chronic muscle pain and fatigue. Denies decrease in range of motion, difficulty with gait, or joint swelling.    No other specific complaints in a complete review of systems (except as listed in HPI above).  Objective:   Physical Exam  BP 130/78   Pulse 67   Temp 98.3 F (36.8 C) (Oral)   Wt (!) 350 lb (158.8 kg)   SpO2 96%   BMI 54.82 kg/m  Wt Readings from Last 3 Encounters:  05/26/16 (!) 350 lb (158.8 kg)  03/14/16 (!) 347 lb 8 oz (157.6 kg)  01/13/16 (!) 320 lb (145.2 kg)    General: Appears his stated age, obese in NAD. Cardiovascular: Normal rate and rhythm.  Pulmonary/Chest: Normal effort and positive vesicular breath sounds. No respiratory distress. No wheezes, rales or ronchi noted.  Musculoskeletal: Strength 5/5 BUE/BLE. No signs of joint swelling. No difficulty with gait.  Neurological: Alert and oriented.    BMET    Component Value Date/Time   NA 140 01/13/2016 0008   NA 136 07/03/2013 0416   K 4.1 01/13/2016 0008   K 4.0 07/03/2013 0416   CL 101 01/13/2016 0008   CL 99 07/03/2013 0416   CO2 33 (H) 01/13/2016 0008   CO2 33 (H) 07/03/2013 0416   GLUCOSE 141 (H) 01/13/2016 0008   GLUCOSE 128 (H) 07/03/2013 0416   BUN 21 (H) 01/13/2016 0008   BUN 18 07/03/2013 0416   CREATININE 0.79 01/13/2016 0008   CREATININE 0.91 01/05/2016 1025   CALCIUM 8.9 01/13/2016 0008   CALCIUM 8.8 07/03/2013 0416   GFRNONAA >60 01/13/2016 0008   GFRNONAA >89 01/05/2016 1025   GFRAA >60 01/13/2016 0008   GFRAA >89 01/05/2016 1025    Lipid Panel     Component Value Date/Time   CHOL 153 01/05/2016 0956   TRIG 150 (H) 01/05/2016 0956   HDL 45 01/05/2016 0956   CHOLHDL 3.4 01/05/2016 0956   VLDL 30 01/05/2016 0956   LDLCALC 78 01/05/2016 0956    CBC     Component Value Date/Time   WBC 7.8 01/13/2016 0008   RBC 5.07 01/13/2016 0008   HGB 14.5 01/13/2016 0008   HGB 14.4 08/19/2013 1031   HCT 43.6 01/13/2016 0008   HCT 43.7 08/19/2013 1031   PLT 196 01/13/2016 0008   PLT 196 08/19/2013 1031   MCV 86.0 01/13/2016 0008   MCV 87  08/19/2013 1031   MCH 28.6 01/13/2016 0008   MCHC 33.2 01/13/2016 0008   RDW 16.4 (H) 01/13/2016 0008   RDW 16.1 (H) 08/19/2013 1031   LYMPHSABS 1.5 08/19/2013 1031   MONOABS 0.5 08/19/2013 1031   EOSABS 0.1 08/19/2013 1031   BASOSABS 0.0 08/19/2013 1031    Hgb A1C Lab Results  Component Value Date   HGBA1C 6.4 01/05/2016         Assessment & Plan:   Osteoarthritis, Fibromyalgia:  Discussed treating with Cymbalta, Amitriptyline etc He declines those medications at this time He is not interested in referral to pain management at this time Discussed how newer studies show that exercise and weight management are the mainstay treatment for fibromyalgia  Obesity:  He declines referral to general surgery to discuss bariatric surgery at this time Discussed high protein, low carb diet and exercising to lose weight RX for Phentermine provided today- discussed benefits, risks and side effects  RTC in 1 month for weight check/med refill Nicki ReaperBAITY, REGINA, NP

## 2016-06-14 ENCOUNTER — Ambulatory Visit (INDEPENDENT_AMBULATORY_CARE_PROVIDER_SITE_OTHER): Payer: Worker's Compensation

## 2016-06-14 ENCOUNTER — Encounter (HOSPITAL_COMMUNITY): Payer: Self-pay | Admitting: Family Medicine

## 2016-06-14 ENCOUNTER — Ambulatory Visit (HOSPITAL_COMMUNITY)
Admission: EM | Admit: 2016-06-14 | Discharge: 2016-06-14 | Disposition: A | Discharge status: Home / Self Care | Payer: Worker's Compensation | Attending: Family Medicine | Admitting: Family Medicine

## 2016-06-14 DIAGNOSIS — M25562 Pain in left knee: Secondary | ICD-10-CM | POA: Diagnosis not present

## 2016-06-14 DIAGNOSIS — S93402A Sprain of unspecified ligament of left ankle, initial encounter: Secondary | ICD-10-CM

## 2016-06-14 MED ORDER — IPRATROPIUM-ALBUTEROL 0.5-2.5 (3) MG/3ML IN SOLN
RESPIRATORY_TRACT | Status: AC
  Filled 2016-06-14: qty 3

## 2016-06-14 NOTE — ED Triage Notes (Signed)
Patient states that he misjudged steps and fell due to ice. Patient reports left knee and ankle pain, increased pain with ambulation.

## 2016-06-14 NOTE — ED Provider Notes (Signed)
[facility]    CSN: [id number] Arrival date & time: 06/14/16  1024     History   Chief Complaint Chief Complaint  Patient presents with  . Ankle Pain  . Knee Pain    HPI Lora PaulaKeith Bebee is a 61 y.o. male.   61 yo gentleman who presents with a workers comp injury.  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 He twisted his ankle and knee on the left side. He also has some mild pain in his left back. He says that the pain is better now than it was when he injured himself at 8:15 AM.      Past Medical History:  Diagnosis Date  . Arthritis   . Benign prostatic hyperplasia (BPH) with urinary urgency    nodular  . COPD (chronic obstructive pulmonary disease) (HCC)   . Depression   . Diabetes mellitus without complication (HCC)   . Essential hypertension   . Fibromyalgia, primary   . Hyperlipidemia   . Hypertension   . Obesity     Patient Active Problem List   Diagnosis Date Noted  . Arthritis 03/14/2016  . BPH (benign prostatic hyperplasia) 03/14/2016  . Depression 03/14/2016  . HTN (hypertension) 03/14/2016  . Fibromyalgia 03/14/2016  . HLD (hyperlipidemia) 03/14/2016  . Insomnia 03/14/2016  . COPD (chronic obstructive pulmonary disease) (HCC) 11/26/2015  . Severe obstructive sleep apnea 06/15/2015  . Tobacco use 06/15/2015  . BMI 50.0-59.9, adult (HCC) 05/26/2015  . Type 2 diabetes mellitus (HCC) 05/04/2015    History reviewed. No pertinent surgical history.     Home Medications    Prior to Admission medications   Medication Sig Start Date End Date Taking? Authorizing Provider  albuterol (PROVENTIL) (2.5 MG/3ML) 0.083% nebulizer solution Take 3 mLs (2.5 mg total) by nebulization every 6 (six) hours as needed for wheezing or shortness of breath. 11/26/15  Yes Janeann ForehandJames H Hawkins Jr., MD  aspirin 81 MG tablet Take 81 mg by mouth  daily. Takes 4 81mg  aspirin daily.   Yes Historical Provider, MD  atorvastatin (LIPITOR) 20 MG tablet Take 1 tablet (20 mg total) by mouth daily. 01/13/16  Yes Amy Rusty AusLauren Krebs, NP  diclofenac sodium (VOLTAREN) 1 % GEL Apply 2 g topically 4 (four) times daily. 01/05/16  Yes Amy Rusty AusLauren Krebs, NP  Elastic Bandages & Supports (MEDICAL COMPRESSION SOCKS) MISC 1 each by Does not apply route daily. 01/05/16  Yes Amy Rusty AusLauren Krebs, NP  furosemide (LASIX) 20 MG tablet Take 1 tablet (20 mg total) by mouth daily. 11/26/15  Yes Janeann ForehandJames H Hawkins Jr., MD  gabapentin (NEURONTIN) 100 MG capsule Take 3 capsules, three times a day.  Follow written instructions to taper up to this dose. 11/26/15  Yes Janeann ForehandJames H Hawkins Jr., MD  hydrochlorothiazide (MICROZIDE) 12.5 MG capsule Take 1 capsule (12.5 mg total) by mouth daily. 03/14/16  Yes Lorre Munroeegina W Baity, NP  indomethacin (INDOCIN) 50 MG capsule Take 1 capsule (50 mg total) by mouth 3 (three) times daily as needed. 01/13/16  Yes Darci Currentandolph N Brown, MD  metFORMIN (GLUCOPHAGE) 1000 MG tablet Take 1 tablet (1,000 mg total) by mouth 2 (two) times daily with a meal. 11/26/15  Yes Janeann ForehandJames H Hawkins Jr., MD  Naltrexone-Bupropion HCl ER 8-90 MG TB12 Take 2 tablets by mouth 2 (two) times daily. Titrate as directed. 01/05/16  Yes Amy Leotis ShamesLauren  Krebs, NP  phentermine 37.5 MG capsule Take 1 capsule (37.5 mg total) by mouth every morning. 05/26/16  Yes Lorre Munroe, NP  tamsulosin (FLOMAX) 0.4 MG CAPS capsule Take 0.4 mg by mouth.   Yes Historical Provider, MD  acetaminophen (TYLENOL) 500 MG tablet Take 1 tablet (500 mg total) by mouth every 8 (eight) hours as needed. 01/05/16   Amy Rusty Aus, NP    Family History Family History  Problem Relation Age of Onset  . Heart disease Father   . Diabetes Mellitus II Sister   . Myasthenia gravis Sister   . Fibrocystic breast disease Sister   . Hypertension Sister   . Diabetes Mellitus II Brother     Social History Social History  Substance Use Topics    . Smoking status: Current Some Day Smoker    Types: Cigarettes    Last attempt to quit: 04/04/2015  . Smokeless tobacco: Never Used     Comment: 3-4 daily  . Alcohol use No     Allergies   Penicillins   Review of Systems Review of Systems  Constitutional: Negative.   HENT: Negative.   Musculoskeletal: Positive for arthralgias, gait problem and joint swelling. Negative for neck pain.     Physical Exam Triage Vital Signs ED Triage Vitals  Enc Vitals Group     BP      Pulse      Resp      Temp      Temp src      SpO2      Weight      Height      Head Circumference      Peak Flow      Pain Score      Pain Loc      Pain Edu?      Excl. in GC?    No data found.   Updated Vital Signs Pulse 79   Temp 98 F (36.7 C) (Oral)   Resp 23   SpO2 92% Comment: patient is overweight, no respiratory distress noted  Physical Exam  Constitutional: He is oriented to person, place, and time. He appears well-developed and well-nourished.  HENT:  Head: Normocephalic.  Right Ear: External ear normal.  Left Ear: External ear normal.  Mouth/Throat: Oropharynx is clear and moist.  Eyes: Conjunctivae and EOM are normal.  Neck: Normal range of motion. Neck supple.  Pulmonary/Chest: Effort normal.  Musculoskeletal:  Tender and swollen left malleolus.  No foot tenderness. Mild left knee joint line tenderness without effusion. No obvious bony abnormality.  Neurological: He is alert and oriented to person, place, and time.  Nursing note and vitals reviewed.    UC Treatments / Results  Labs (all labs ordered are listed, but only abnormal results are displayed) Labs Reviewed - No data to display  EKG  EKG Interpretation None       Radiology Dg Ankle Complete Left  Result Date: 06/14/2016 CLINICAL DATA:  Left ankle pain and swelling secondary to a twisting injury earlier this morning. EXAM: LEFT ANKLE COMPLETE - 3+ VIEW COMPARISON:  None. FINDINGS: There is no evidence  of fracture, dislocation, or joint effusion. There is no evidence of arthropathy or other focal bone abnormality. Soft tissues are unremarkable. IMPRESSION: Negative. Electronically Signed   By: Francene Boyers M.D.   On: 06/14/2016 11:33   Dg Knee Complete 4 Views Left  Result Date: 06/14/2016 CLINICAL DATA:  Left knee pain secondary to a twisting injury earlier today. EXAM:  LEFT KNEE - COMPLETE 4+ VIEW COMPARISON:  05/26/2015 FINDINGS: No evidence of fracture, dislocation, or joint effusion. Small marginal osteophytes in the patellofemoral and lateral compartments. Soft tissues are unremarkable. IMPRESSION: No acute abnormalities. Electronically Signed   By: Francene Boyers M.D.   On: 06/14/2016 11:34    Procedures Procedures (including critical care time)  Medications Ordered in UC Medications - No data to display   Initial Impression / Assessment and Plan / UC Course  I have reviewed the triage vital signs and the nursing notes.  Pertinent labs & imaging results that were available during my care of the patient were reviewed by me and considered in my medical decision making (see chart for details).  Clinical Course     Final Clinical Impressions(s) / UC Diagnoses   Final diagnoses:  Sprain of left ankle, unspecified ligament, initial encounter  Acute pain of left knee    New Prescriptions New Prescriptions   No medications on file  Patient to continue his current anti-inflammatory regimen   Elvina Sidle, MD 06/14/16 1142

## 2016-06-14 NOTE — Discharge Instructions (Signed)
I suspect you will have pain in your ankle and knee for the next 5-7 days. You're already taking medicine that should help with the inflammation. I'm writing a note to relieve you of your work responsibilities until Monday.

## 2016-06-29 ENCOUNTER — Encounter: Payer: Self-pay | Admitting: Internal Medicine

## 2016-06-29 ENCOUNTER — Ambulatory Visit (INDEPENDENT_AMBULATORY_CARE_PROVIDER_SITE_OTHER): Payer: BC Managed Care – PPO | Admitting: Internal Medicine

## 2016-06-29 ENCOUNTER — Other Ambulatory Visit: Payer: Self-pay | Admitting: Family Medicine

## 2016-06-29 VITALS — BP 134/96 | HR 68 | Temp 98.1°F | Wt 343.0 lb

## 2016-06-29 DIAGNOSIS — M79662 Pain in left lower leg: Secondary | ICD-10-CM

## 2016-06-29 DIAGNOSIS — R0602 Shortness of breath: Secondary | ICD-10-CM | POA: Diagnosis not present

## 2016-06-29 DIAGNOSIS — J189 Pneumonia, unspecified organism: Secondary | ICD-10-CM

## 2016-06-29 DIAGNOSIS — J181 Lobar pneumonia, unspecified organism: Secondary | ICD-10-CM

## 2016-06-29 DIAGNOSIS — M7989 Other specified soft tissue disorders: Principal | ICD-10-CM

## 2016-06-29 MED ORDER — PREDNISONE 10 MG PO TABS: ORAL_TABLET | ORAL | 0 refills | Status: DC

## 2016-06-29 MED ORDER — ALBUTEROL SULFATE (2.5 MG/3ML) 0.083% IN NEBU
2.5000 mg | INHALATION_SOLUTION | Freq: Once | RESPIRATORY_TRACT | Status: AC
  Administered 2016-06-29: 2.5 mg via RESPIRATORY_TRACT

## 2016-06-29 MED ORDER — IPRATROPIUM BROMIDE 0.02 % IN SOLN
0.5000 mg | Freq: Once | RESPIRATORY_TRACT | Status: AC
  Administered 2016-06-29: 0.5 mg via RESPIRATORY_TRACT

## 2016-06-29 MED ORDER — HYDROCODONE-HOMATROPINE 5-1.5 MG/5ML PO SYRP: 5.0000 mL | ORAL_SOLUTION | Freq: Three times a day (TID) | ORAL | 0 refills | Status: DC | PRN

## 2016-06-29 MED ORDER — ALBUTEROL SULFATE (2.5 MG/3ML) 0.083% IN NEBU: 2.5000 mg | INHALATION_SOLUTION | Freq: Four times a day (QID) | RESPIRATORY_TRACT | 6 refills | Status: DC | PRN

## 2016-06-29 MED ORDER — LEVOFLOXACIN 500 MG PO TABS: 500.0000 mg | ORAL_TABLET | Freq: Every day | ORAL | 0 refills | Status: DC

## 2016-06-29 NOTE — Progress Notes (Signed)
HPI  Pt presents to the clinic today with c/o ear pain, sore throat and cough. This started 3-4 days ago. He reports the ear pain is sharp and stabbing, L>R. He denies decreased hearing. He denies difficulty swallowing. The cough is productive of yellow mucous. He is short of breath, worse than usual. He reports associated nausea and loose stools. He denies abdominal pain or blood in his stool. He denies fever or chills but has felt very fatigued. 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 He has Albuterol nebs but reports he ran out of these yesterday. He is requesting a refill today. He has a history of COPD. He does not take any immunizations. He has had sick contacts.  He is also bringing up other issues...anxiety, difficulty walking around campus due to weakness in his legs and shortness of breath with exertion. I advised him to make a follow up appt with me to discuss these issue separately.  He also brought FMLA forms that he is requesting I complete. He reports he has FMLA for neuropathy, arthritis, diabetes, HTN and fibromyalgia.  Review of Systems        Past Medical History:  Diagnosis Date  . Arthritis   . Benign prostatic hyperplasia (BPH) with urinary urgency    nodular  . COPD (chronic obstructive pulmonary disease) (HCC)   . Depression   . Diabetes mellitus without complication (HCC)   . Essential hypertension   . Fibromyalgia, primary   . Hyperlipidemia   . Hypertension   . Obesity     Family History  Problem Relation Age of Onset  . Heart disease Father   . Diabetes Mellitus II Sister   . Myasthenia gravis Sister   . Fibrocystic breast disease Sister   . Hypertension Sister   . Diabetes Mellitus II Brother     Social History   Social History  . Marital status: Married    Spouse name: N/A  . Number of children: N/A  . Years of education: N/A   Occupational History  . Not on file.   Social History Main Topics  . Smoking status: Current  Some Day Smoker    Types: Cigarettes    Last attempt to quit: 04/04/2015  . Smokeless tobacco: Never Used     Comment: 3-4 daily  . Alcohol use No  . Drug use: No  . Sexual activity: Yes   Other Topics Concern  . Not on file   Social History Narrative  . No narrative on file    Allergies  Allergen Reactions  . Penicillins Anaphylaxis     Constitutional: Positive fever. Denies headache, fever or abrupt weight changes.  HEENT:  Positive ear pain, sore throat. Denies eye redness, eye pain, pressure behind the eyes, facial pain, nasal congestion, ringing in the ears, wax buildup, runny nose or bloody nose. Respiratory: Positive cough and shortness of breath. Denies difficulty breathing or shortness of breath.  Cardiovascular: Denies chest pain, chest tightness, palpitations or swelling in the hands or feet.   No other specific complaints in a complete review of systems (except as listed in HPI above).  Objective:   BP (!) 134/96   Pulse 68   Temp 98.1 F (36.7 C) (Oral)   Wt (!) 343 lb (155.6 kg)   SpO2 90%   BMI 53.72 kg/m   Wt Readings from Last 3 Encounters:  06/29/16 (!) 343 lb (155.6 kg)  05/26/16 (!) 350 lb (158.8 kg)  03/14/16 (!) 347 lb  8 oz (157.6 kg)     General: Appears his stated age, obese in NAD. HEENT: Head: normal shape and size, no sinus tenderness noted; Ears: Tm's gray and intact, normal light reflex; Nose: mucosa pink and moist, septum midline; Throat/Mouth: Teeth present, mucosa erythematous and moist, no exudate noted, no lesions or ulcerations noted.  Neck: No cervical lymphadenopathy.  Cardiovascular: Normal rate and rhythm. S1,S2 noted.  No murmur, rubs or gallops noted.  Pulmonary/Chest: Normal effort with intermittent expiratory wheezes, crackles in the LLL. No respiratory distress.       Assessment & Plan:   CAP:  Get some rest and drink plenty of water Duoneb given in office Refilled Albuterol nebs eRx for Levaquin x 5 days eRx for  Pred Taper x 6 days Rx for Hycodan cough syrup Work note provided  RTC as needed or if symptoms persist.   Nicki ReaperBAITY, REGINA, NP

## 2016-06-29 NOTE — Patient Instructions (Signed)
Community-Acquired Pneumonia, Adult °Introduction °Pneumonia is an infection of the lungs. One type of pneumonia can happen while a person is in a hospital. A different type can happen when a person is not in a hospital (community-acquired pneumonia). It is easy for this kind to spread from person to person. It can spread to you if you breathe near an infected person who coughs or sneezes. Some symptoms include: °· A dry cough. °· A wet (productive) cough. °· Fever. °· Sweating. °· Chest pain. °Follow these instructions at home: °· Take over-the-counter and prescription medicines only as told by your doctor. °¨ Only take cough medicine if you are losing sleep. °¨ If you were prescribed an antibiotic medicine, take it as told by your doctor. Do not stop taking the antibiotic even if you start to feel better. °· Sleep with your head and neck raised (elevated). You can do this by putting a few pillows under your head, or you can sleep in a recliner. °· Do not use tobacco products. These include cigarettes, chewing tobacco, and e-cigarettes. If you need help quitting, ask your doctor. °· Drink enough water to keep your pee (urine) clear or pale yellow. °A shot (vaccine) can help prevent pneumonia. Shots are often suggested for: °· People older than 59 years of age. °· People older than 59 years of age: °¨ Who are having cancer treatment. °¨ Who have long-term (chronic) lung disease. °¨ Who have problems with their body's defense system (immune system). °You may also prevent pneumonia if you take these actions: °· Get the flu (influenza) shot every year. °· Go to the dentist as often as told. °· Wash your hands often. If soap and water are not available, use hand sanitizer. °Contact a doctor if: °· You have a fever. °· You lose sleep because your cough medicine does not help. °Get help right away if: °· You are short of breath and it gets worse. °· You have more chest pain. °· Your sickness gets worse. This is very  serious if: °¨ You are an older adult. °¨ Your body's defense system is weak. °· You cough up blood. °This information is not intended to replace advice given to you by your health care provider. Make sure you discuss any questions you have with your health care provider. °Document Released: 11/01/2007 Document Revised: 10/21/2015 Document Reviewed: 09/09/2014 °© 2017 Elsevier ° °

## 2016-06-29 NOTE — Addendum Note (Signed)
Addended by: Roena MaladyEVONTENNO, Diann Bangerter Y on: 06/29/2016 11:36 AM   Modules accepted: Orders

## 2016-06-30 ENCOUNTER — Telehealth: Payer: Self-pay | Admitting: Internal Medicine

## 2016-06-30 NOTE — Telephone Encounter (Signed)
fmla paperwork in [facility] in box °For review and signature °

## 2016-07-03 ENCOUNTER — Ambulatory Visit: Payer: Self-pay | Admitting: Internal Medicine

## 2016-07-03 ENCOUNTER — Other Ambulatory Visit: Payer: Self-pay

## 2016-07-03 ENCOUNTER — Telehealth: Payer: Self-pay | Admitting: Internal Medicine

## 2016-07-03 DIAGNOSIS — M79662 Pain in left lower leg: Secondary | ICD-10-CM

## 2016-07-03 DIAGNOSIS — M7989 Other specified soft tissue disorders: Principal | ICD-10-CM

## 2016-07-03 MED ORDER — FUROSEMIDE 20 MG PO TABS: 20.0000 mg | ORAL_TABLET | Freq: Every day | ORAL | 6 refills | Status: DC

## 2016-07-03 NOTE — Telephone Encounter (Signed)
Pt request refill furosemide; pt taking HCTZ and furosemide. Pt said when he sits his legs swell; could not understand if pt is elevating his legs when sitting but when pt goes to bed at night and keeps feet up the next morning pt said the swelling was gone.[facility] [address]

## 2016-07-03 NOTE — Telephone Encounter (Signed)
Paperwork faxed  Gave spouse copy of paperwork ok per dpr Copy for file Copy for scan

## 2016-07-03 NOTE — Telephone Encounter (Signed)
Ok for note 

## 2016-07-03 NOTE — Telephone Encounter (Signed)
Done, given back to Robin 

## 2016-07-03 NOTE — Telephone Encounter (Signed)
Spouse came in wanting to get a letter for pt.  She stated he cannot go back to work today he is still weak.  She wanted to see if you could write him a work note to go back Wednesday 07/05/16  She would like to pick up asap.

## 2016-07-03 NOTE — Telephone Encounter (Signed)
Left note at front desk to ck on status of FMLA paperwork; request cb.

## 2016-07-03 NOTE — Telephone Encounter (Signed)
Letter has been placed in the front office for pick up 

## 2016-10-25 ENCOUNTER — Ambulatory Visit: Payer: BC Managed Care – PPO | Admitting: Internal Medicine

## 2016-10-25 NOTE — Progress Notes (Deleted)
Subjective:    Patient ID: Jonathon Graham, male    DOB: Oct 19, 1955, 61 y.o.   MRN: [id number]  HPI  Pt presents to the clinic today with c/o left leg swelling.  Review of Systems      Past Medical History:  Diagnosis Date  . Arthritis   . Benign prostatic hyperplasia (BPH) with urinary urgency    nodular  . COPD (chronic obstructive pulmonary disease) (HCC)   . Depression   . Diabetes mellitus without complication (HCC)   . Essential hypertension   . Fibromyalgia, primary   . Hyperlipidemia   . Hypertension   . Obesity     Current Outpatient Prescriptions  Medication Sig Dispense Refill  . acetaminophen (TYLENOL) 500 MG tablet Take 1 tablet (500 mg total) by mouth every 8 (eight) hours as needed. 30 tablet 0  . albuterol (PROVENTIL) (2.5 MG/3ML) 0.083% nebulizer solution Take 3 mLs (2.5 mg total) by nebulization every 6 (six) hours as needed for wheezing or shortness of breath. 150 mL 6  . aspirin 81 MG tablet Take 81 mg by mouth daily. Takes 4 81mg  aspirin daily.    Marland Kitchen. atorvastatin (LIPITOR) 20 MG tablet Take 1 tablet (20 mg total) by mouth daily. 90 tablet 3  . diclofenac sodium (VOLTAREN) 1 % GEL Apply 2 g topically 4 (four) times daily. 100 g 2  . Elastic Bandages & Supports (MEDICAL COMPRESSION SOCKS) MISC 1 each by Does not apply route daily. 1 each 0  . furosemide (LASIX) 20 MG tablet Take 1 tablet (20 mg total) by mouth daily. 30 tablet 6  . gabapentin (NEURONTIN) 100 MG capsule Take 3 capsules, three times a day.  Follow written instructions to taper up to this dose. 270 capsule 6  . hydrochlorothiazide (MICROZIDE) 12.5 MG capsule Take 1 capsule (12.5 mg total) by mouth daily. 30 capsule 5  . HYDROcodone-homatropine (HYCODAN) 5-1.5 MG/5ML syrup Take 5 mLs by mouth every 8 (eight) hours as needed for cough. 120 mL 0  . indomethacin (INDOCIN) 50 MG capsule Take 1 capsule (50 mg total) by mouth 3 (three) times daily as needed. 15 capsule 0  . levofloxacin (LEVAQUIN) 500  MG tablet Take 1 tablet (500 mg total) by mouth daily. 7 tablet 0  . metFORMIN (GLUCOPHAGE) 1000 MG tablet Take 1 tablet (1,000 mg total) by mouth 2 (two) times daily with a meal. 180 tablet 6  . Naltrexone-Bupropion HCl ER 8-90 MG TB12 Take 2 tablets by mouth 2 (two) times daily. Titrate as directed. 120 tablet 2  . phentermine 37.5 MG capsule Take 1 capsule (37.5 mg total) by mouth every morning. 30 capsule 0  . predniSONE (DELTASONE) 10 MG tablet Take 6 tabs day 1, 5 tabs day 2, 4 tabs day 3, 3 tabs day 4, 2 tabs day 5, 1 tab day 6 21 tablet 0  . tamsulosin (FLOMAX) 0.4 MG CAPS capsule Take 0.4 mg by mouth.     Current Facility-Administered Medications  Medication Dose Route Frequency Provider Last Rate Last Dose  . ipratropium-albuterol (DUONEB) 0.5-2.5 (3) MG/3ML nebulizer solution 3 mL  3 mL Nebulization Q6H Krebs, Amy Lauren, NP        Allergies  Allergen Reactions  . Penicillins Anaphylaxis    Family History  Problem Relation Age of Onset  . Heart disease Father   . Diabetes Mellitus II Sister   . Myasthenia gravis Sister   . Fibrocystic breast disease Sister   . Hypertension Sister   . Diabetes Mellitus  II Brother     Social History   Social History  . Marital status: Married    Spouse name: N/A  . Number of children: N/A  . Years of education: N/A   Occupational History  . Not on file.   Social History Main Topics  . Smoking status: Current Some Day Smoker    Types: Cigarettes    Last attempt to quit: 04/04/2015  . Smokeless tobacco: Never Used     Comment: 3-4 daily  . Alcohol use No  . Drug use: No  . Sexual activity: Yes   Other Topics Concern  . Not on file   Social History Narrative  . No narrative on file     Constitutional: Denies fever, malaise, fatigue, headache or abrupt weight changes.  HEENT: Denies eye pain, eye redness, ear pain, ringing in the ears, wax buildup, runny nose, nasal congestion, bloody nose, or sore throat. Respiratory:  Denies difficulty breathing, shortness of breath, cough or sputum production.   Cardiovascular: Denies chest pain, chest tightness, palpitations or swelling in the hands or feet.  Gastrointestinal: Denies abdominal pain, bloating, constipation, diarrhea or blood in the stool.  GU: Denies urgency, frequency, pain with urination, burning sensation, blood in urine, odor or discharge. Musculoskeletal: Pt reports left leg swelling. Denies decrease in range of motion, difficulty with gait, muscle pain or joint pain.  Skin: Denies redness, rashes, lesions or ulcercations.  Neurological: Denies dizziness, difficulty with memory, difficulty with speech or problems with balance and coordination.  Psych: Denies anxiety, depression, SI/HI.  No other specific complaints in a complete review of systems (except as listed in HPI above).  Objective:   Physical Exam        Assessment & Plan:

## 2016-10-27 ENCOUNTER — Ambulatory Visit (INDEPENDENT_AMBULATORY_CARE_PROVIDER_SITE_OTHER): Payer: BC Managed Care – PPO | Admitting: Internal Medicine

## 2016-10-27 ENCOUNTER — Encounter: Payer: Self-pay | Admitting: Internal Medicine

## 2016-10-27 VITALS — BP 132/84 | HR 84 | Temp 98.2°F | Wt 356.0 lb

## 2016-10-27 DIAGNOSIS — M545 Low back pain, unspecified: Secondary | ICD-10-CM

## 2016-10-27 DIAGNOSIS — R609 Edema, unspecified: Secondary | ICD-10-CM | POA: Diagnosis not present

## 2016-10-27 NOTE — Patient Instructions (Signed)

## 2016-10-27 NOTE — Progress Notes (Signed)
Subjective:    Patient ID: Jonathon Graham, male    DOB: Sep 15, 1955, 61 y.o.   MRN: [id number]  HPI  Pt presents to the clinic today with c/o left leg swelling and pain. He reports this started 2-3 months ago. He describes the pain as tight and burning. He does have a little numbness, but denies tingling or weakness. He reports associated low back pain that started around the same time, but he is not sure if it is related. He denies any injury to his back or legs. He is obese, has a history of fibromyalgia, and is taking Neurontin as prescribed.  He has tried Ibuprofen, heat and ice without any relief. He reports he also tried his wife's Percocet with good relief. He reports he has worn compression hose but it only makes it worse.  Review of Systems      Past Medical History:  Diagnosis Date  . Arthritis   . Benign prostatic hyperplasia (BPH) with urinary urgency    nodular  . COPD (chronic obstructive pulmonary disease) (HCC)   . Depression   . Diabetes mellitus without complication (HCC)   . Essential hypertension   . Fibromyalgia, primary   . Hyperlipidemia   . Hypertension   . Obesity     Current Outpatient Prescriptions  Medication Sig Dispense Refill  . acetaminophen (TYLENOL) 500 MG tablet Take 1 tablet (500 mg total) by mouth every 8 (eight) hours as needed. 30 tablet 0  . albuterol (PROVENTIL) (2.5 MG/3ML) 0.083% nebulizer solution Take 3 mLs (2.5 mg total) by nebulization every 6 (six) hours as needed for wheezing or shortness of breath. 150 mL 6  . aspirin 81 MG tablet Take 81 mg by mouth daily. Takes 4 81mg  aspirin daily.    Marland Kitchen atorvastatin (LIPITOR) 20 MG tablet Take 1 tablet (20 mg total) by mouth daily. 90 tablet 3  . diclofenac sodium (VOLTAREN) 1 % GEL Apply 2 g topically 4 (four) times daily. 100 g 2  . Elastic Bandages & Supports (MEDICAL COMPRESSION SOCKS) MISC 1 each by Does not apply route daily. 1 each 0  . furosemide (LASIX) 20 MG tablet Take 1 tablet (20  mg total) by mouth daily. 30 tablet 6  . gabapentin (NEURONTIN) 100 MG capsule Take 3 capsules, three times a day.  Follow written instructions to taper up to this dose. 270 capsule 6  . hydrochlorothiazide (MICROZIDE) 12.5 MG capsule Take 1 capsule (12.5 mg total) by mouth daily. 30 capsule 5  . HYDROcodone-homatropine (HYCODAN) 5-1.5 MG/5ML syrup Take 5 mLs by mouth every 8 (eight) hours as needed for cough. 120 mL 0  . indomethacin (INDOCIN) 50 MG capsule Take 1 capsule (50 mg total) by mouth 3 (three) times daily as needed. 15 capsule 0  . levofloxacin (LEVAQUIN) 500 MG tablet Take 1 tablet (500 mg total) by mouth daily. 7 tablet 0  . metFORMIN (GLUCOPHAGE) 1000 MG tablet Take 1 tablet (1,000 mg total) by mouth 2 (two) times daily with a meal. 180 tablet 6  . Naltrexone-Bupropion HCl ER 8-90 MG TB12 Take 2 tablets by mouth 2 (two) times daily. Titrate as directed. 120 tablet 2  . phentermine 37.5 MG capsule Take 1 capsule (37.5 mg total) by mouth every morning. 30 capsule 0  . predniSONE (DELTASONE) 10 MG tablet Take 6 tabs day 1, 5 tabs day 2, 4 tabs day 3, 3 tabs day 4, 2 tabs day 5, 1 tab day 6 21 tablet 0  . tamsulosin (FLOMAX)  0.4 MG CAPS capsule Take 0.4 mg by mouth.     Current Facility-Administered Medications  Medication Dose Route Frequency Provider Last Rate Last Dose  . ipratropium-albuterol (DUONEB) 0.5-2.5 (3) MG/3ML nebulizer solution 3 mL  3 mL Nebulization Q6H Krebs, Amy Lauren, NP        Allergies  Allergen Reactions  . Penicillins Anaphylaxis    Family History  Problem Relation Age of Onset  . Heart disease Father   . Diabetes Mellitus II Sister   . Myasthenia gravis Sister   . Fibrocystic breast disease Sister   . Hypertension Sister   . Diabetes Mellitus II Brother     Social History   Social History  . Marital status: Married    Spouse name: N/A  . Number of children: N/A  . Years of education: N/A   Occupational History  . Not on file.   Social  History Main Topics  . Smoking status: Current Some Day Smoker    Types: Cigarettes    Last attempt to quit: 04/04/2015  . Smokeless tobacco: Never Used     Comment: 3-4 daily  . Alcohol use No  . Drug use: No  . Sexual activity: Yes   Other Topics Concern  . Not on file   Social History Narrative  . No narrative on file     Constitutional: Denies fever, malaise, fatigue, headache or abrupt weight changes.  Musculoskeletal: Pt reports back pain and left leg pain.  Neurological: Denies dizziness, difficulty with memory, difficulty with speech or problems with balance and coordination.    No other specific complaints in a complete review of systems (except as listed in HPI above).  Objective:   Physical Exam   BP 132/84   Pulse 84   Temp 98.2 F (36.8 C) (Oral)   Wt (!) 356 lb (161.5 kg)   SpO2 96%   BMI 55.76 kg/m  Wt Readings from Last 3 Encounters:  10/27/16 (!) 356 lb (161.5 kg)  06/29/16 (!) 343 lb (155.6 kg)  05/26/16 (!) 350 lb (158.8 kg)    General: Appears his stated age, obese in NAD. Musculoskeletal: Pain with palpation of the lumbar spine. Unable to assess ROM of spine at this time. 2+ pitting edema LLE, 1+ non pitting edema RLE. Neurological: Alert and oriented. Sensation intact to BLE.   BMET    Component Value Date/Time   NA 140 01/13/2016 0008   NA 136 07/03/2013 0416   K 4.1 01/13/2016 0008   K 4.0 07/03/2013 0416   CL 101 01/13/2016 0008   CL 99 07/03/2013 0416   CO2 33 (H) 01/13/2016 0008   CO2 33 (H) 07/03/2013 0416   GLUCOSE 141 (H) 01/13/2016 0008   GLUCOSE 128 (H) 07/03/2013 0416   BUN 21 (H) 01/13/2016 0008   BUN 18 07/03/2013 0416   CREATININE 0.79 01/13/2016 0008   CREATININE 0.91 01/05/2016 1025   CALCIUM 8.9 01/13/2016 0008   CALCIUM 8.8 07/03/2013 0416   GFRNONAA >60 01/13/2016 0008   GFRNONAA >89 01/05/2016 1025   GFRAA >60 01/13/2016 0008   GFRAA >89 01/05/2016 1025    Lipid Panel     Component Value Date/Time    CHOL 153 01/05/2016 0956   TRIG 150 (H) 01/05/2016 0956   HDL 45 01/05/2016 0956   CHOLHDL 3.4 01/05/2016 0956   VLDL 30 01/05/2016 0956   LDLCALC 78 01/05/2016 0956    CBC    Component Value Date/Time   WBC 7.8 01/13/2016 0008  RBC 5.07 01/13/2016 0008   HGB 14.5 01/13/2016 0008   HGB 14.4 08/19/2013 1031   HCT 43.6 01/13/2016 0008   HCT 43.7 08/19/2013 1031   PLT 196 01/13/2016 0008   PLT 196 08/19/2013 1031   MCV 86.0 01/13/2016 0008   MCV 87 08/19/2013 1031   MCH 28.6 01/13/2016 0008   MCHC 33.2 01/13/2016 0008   RDW 16.4 (H) 01/13/2016 0008   RDW 16.1 (H) 08/19/2013 1031   LYMPHSABS 1.5 08/19/2013 1031   MONOABS 0.5 08/19/2013 1031   EOSABS 0.1 08/19/2013 1031   BASOSABS 0.0 08/19/2013 1031    Hgb A1C Lab Results  Component Value Date   HGBA1C 6.4 01/05/2016           Assessment & Plan:   Peripheral Edema, L>R:  Likely due to Neurontin, but he needs this medication BMET today If creatinine normal, will increase Lasix to 40 mg daily Encouraged him to wear the compression hose He reports he has seen a vascular doctor in the past  Low Back Pain:  Complicated by obesity Xray of lumbar spine ordered today Try Tylenol Arthritis 650 mg TID Advised him not to take other peoples medications  Will follow up after labs, RTC as needed Nicki ReaperBAITY, Carlus Stay, NP

## 2016-10-28 LAB — BASIC METABOLIC PANEL
BUN: 18 mg/dL (ref 7–25)
CO2: 29 mmol/L (ref 20–31)
Calcium: 9.2 mg/dL (ref 8.6–10.3)
Chloride: 101 mmol/L (ref 98–110)
Creat: 0.81 mg/dL (ref 0.70–1.33)
Glucose, Bld: 99 mg/dL (ref 65–99)
Potassium: 4.2 mmol/L (ref 3.5–5.3)
Sodium: 143 mmol/L (ref 135–146)

## 2016-10-31 ENCOUNTER — Ambulatory Visit
Admission: RE | Admit: 2016-10-31 | Discharge: 2016-10-31 | Disposition: A | Discharge status: Home / Self Care | Payer: BC Managed Care – PPO | Source: Ambulatory Visit | Attending: Internal Medicine | Admitting: Internal Medicine

## 2016-10-31 DIAGNOSIS — M545 Low back pain, unspecified: Secondary | ICD-10-CM

## 2016-10-31 DIAGNOSIS — E669 Obesity, unspecified: Secondary | ICD-10-CM | POA: Diagnosis not present

## 2016-10-31 DIAGNOSIS — M797 Fibromyalgia: Secondary | ICD-10-CM | POA: Insufficient documentation

## 2016-10-31 DIAGNOSIS — M4316 Spondylolisthesis, lumbar region: Secondary | ICD-10-CM | POA: Diagnosis not present

## 2016-10-31 MED ORDER — FUROSEMIDE 40 MG PO TABS: 40.0000 mg | ORAL_TABLET | Freq: Every day | ORAL | 3 refills | Status: DC

## 2016-10-31 NOTE — Addendum Note (Signed)
Addended by: Roena MaladyEVONTENNO, MELANIE Y on: 10/31/2016 04:30 PM   Modules accepted: Orders

## 2016-11-01 ENCOUNTER — Other Ambulatory Visit: Payer: Self-pay | Admitting: Internal Medicine

## 2016-11-01 DIAGNOSIS — G8929 Other chronic pain: Secondary | ICD-10-CM

## 2016-11-01 DIAGNOSIS — M545 Low back pain: Principal | ICD-10-CM

## 2016-11-19 ENCOUNTER — Inpatient Hospital Stay
Admission: EM | Admit: 2016-11-19 | Discharge: 2016-11-22 | DRG: 189 | Disposition: A | Discharge status: Home / Self Care | Payer: BC Managed Care – PPO | Attending: Internal Medicine | Admitting: Internal Medicine

## 2016-11-19 ENCOUNTER — Emergency Department: Payer: BC Managed Care – PPO

## 2016-11-19 ENCOUNTER — Encounter: Payer: Self-pay | Admitting: Radiology

## 2016-11-19 DIAGNOSIS — F329 Major depressive disorder, single episode, unspecified: Secondary | ICD-10-CM | POA: Diagnosis present

## 2016-11-19 DIAGNOSIS — Z7982 Long term (current) use of aspirin: Secondary | ICD-10-CM

## 2016-11-19 DIAGNOSIS — I5030 Unspecified diastolic (congestive) heart failure: Secondary | ICD-10-CM | POA: Diagnosis present

## 2016-11-19 DIAGNOSIS — F17210 Nicotine dependence, cigarettes, uncomplicated: Secondary | ICD-10-CM | POA: Diagnosis present

## 2016-11-19 DIAGNOSIS — N401 Enlarged prostate with lower urinary tract symptoms: Secondary | ICD-10-CM | POA: Diagnosis present

## 2016-11-19 DIAGNOSIS — G4733 Obstructive sleep apnea (adult) (pediatric): Secondary | ICD-10-CM | POA: Diagnosis present

## 2016-11-19 DIAGNOSIS — R0902 Hypoxemia: Secondary | ICD-10-CM | POA: Diagnosis not present

## 2016-11-19 DIAGNOSIS — M79601 Pain in right arm: Secondary | ICD-10-CM

## 2016-11-19 DIAGNOSIS — Z6841 Body Mass Index (BMI) 40.0 and over, adult: Secondary | ICD-10-CM

## 2016-11-19 DIAGNOSIS — R079 Chest pain, unspecified: Secondary | ICD-10-CM

## 2016-11-19 DIAGNOSIS — Z7984 Long term (current) use of oral hypoglycemic drugs: Secondary | ICD-10-CM

## 2016-11-19 DIAGNOSIS — I110 Hypertensive heart disease with heart failure: Secondary | ICD-10-CM | POA: Diagnosis present

## 2016-11-19 DIAGNOSIS — J9621 Acute and chronic respiratory failure with hypoxia: Principal | ICD-10-CM | POA: Diagnosis present

## 2016-11-19 DIAGNOSIS — J441 Chronic obstructive pulmonary disease with (acute) exacerbation: Secondary | ICD-10-CM | POA: Diagnosis present

## 2016-11-19 DIAGNOSIS — Z9119 Patient's noncompliance with other medical treatment and regimen: Secondary | ICD-10-CM

## 2016-11-19 DIAGNOSIS — J9601 Acute respiratory failure with hypoxia: Secondary | ICD-10-CM | POA: Diagnosis present

## 2016-11-19 DIAGNOSIS — E119 Type 2 diabetes mellitus without complications: Secondary | ICD-10-CM | POA: Diagnosis present

## 2016-11-19 DIAGNOSIS — R3915 Urgency of urination: Secondary | ICD-10-CM | POA: Diagnosis present

## 2016-11-19 DIAGNOSIS — J449 Chronic obstructive pulmonary disease, unspecified: Secondary | ICD-10-CM

## 2016-11-19 DIAGNOSIS — M25511 Pain in right shoulder: Secondary | ICD-10-CM | POA: Diagnosis present

## 2016-11-19 DIAGNOSIS — M797 Fibromyalgia: Secondary | ICD-10-CM | POA: Diagnosis present

## 2016-11-19 DIAGNOSIS — Z79899 Other long term (current) drug therapy: Secondary | ICD-10-CM

## 2016-11-19 DIAGNOSIS — E785 Hyperlipidemia, unspecified: Secondary | ICD-10-CM | POA: Diagnosis present

## 2016-11-19 DIAGNOSIS — M25519 Pain in unspecified shoulder: Secondary | ICD-10-CM

## 2016-11-19 LAB — COMPREHENSIVE METABOLIC PANEL
ALT: 17 U/L (ref 17–63)
AST: 20 U/L (ref 15–41)
Albumin: 4 g/dL (ref 3.5–5.0)
Alkaline Phosphatase: 46 U/L (ref 38–126)
Anion gap: 5 (ref 5–15)
BUN: 17 mg/dL (ref 6–20)
CO2: 33 mmol/L — ABNORMAL HIGH (ref 22–32)
Calcium: 8.8 mg/dL — ABNORMAL LOW (ref 8.9–10.3)
Chloride: 99 mmol/L — ABNORMAL LOW (ref 101–111)
Creatinine, Ser: 0.79 mg/dL (ref 0.61–1.24)
GFR calc Af Amer: >60 mL/min (ref >60)
GFR calc non Af Amer: >60 mL/min (ref >60)
Glucose, Bld: 138 mg/dL — ABNORMAL HIGH (ref 65–99)
Potassium: 4.1 mmol/L (ref 3.5–5.1)
Sodium: 137 mmol/L (ref 135–145)
Total Bilirubin: 0.8 mg/dL (ref 0.3–1.2)
Total Protein: 7.4 g/dL (ref 6.5–8.1)

## 2016-11-19 LAB — CBC
HCT: 44.9 % (ref 40.0–52.0)
Hemoglobin: 14.8 g/dL (ref 13.0–18.0)
MCH: 28.1 pg (ref 26.0–34.0)
MCHC: 33 g/dL (ref 32.0–36.0)
MCV: 85.3 fL (ref 80.0–100.0)
Platelets: 190 10*3/uL (ref 150–440)
RBC: 5.26 MIL/uL (ref 4.40–5.90)
RDW: 16 % — ABNORMAL HIGH (ref 11.5–14.5)
WBC: 9.9 10*3/uL (ref 3.8–10.6)

## 2016-11-19 LAB — TROPONIN I: Troponin I: <0.03 ng/mL (ref <0.03)

## 2016-11-19 MED ORDER — IPRATROPIUM-ALBUTEROL 0.5-2.5 (3) MG/3ML IN SOLN
3.0000 mL | Freq: Once | RESPIRATORY_TRACT | Status: AC
Start: 2016-11-19 — End: 2016-11-19
  Administered 2016-11-19: 3 mL via RESPIRATORY_TRACT
  Filled 2016-11-19: qty 3

## 2016-11-19 MED ORDER — IOPAMIDOL (ISOVUE-370) INJECTION 76%
100.0000 mL | Freq: Once | INTRAVENOUS | Status: AC | PRN
  Administered 2016-11-19: 100 mL via INTRAVENOUS

## 2016-11-19 MED ORDER — IPRATROPIUM-ALBUTEROL 0.5-2.5 (3) MG/3ML IN SOLN
3.0000 mL | Freq: Once | RESPIRATORY_TRACT | Status: AC
  Administered 2016-11-19: 3 mL via RESPIRATORY_TRACT
  Filled 2016-11-19: qty 3

## 2016-11-19 MED ORDER — METHYLPREDNISOLONE SODIUM SUCC 125 MG IJ SOLR
125.0000 mg | Freq: Once | INTRAMUSCULAR | Status: AC
  Administered 2016-11-19: 125 mg via INTRAVENOUS
  Filled 2016-11-19: qty 2

## 2016-11-19 MED ORDER — MORPHINE SULFATE (PF) 4 MG/ML IV SOLN
INTRAVENOUS | Status: AC
  Filled 2016-11-19: qty 1

## 2016-11-19 MED ORDER — ONDANSETRON HCL 4 MG/2ML IJ SOLN
4.0000 mg | Freq: Once | INTRAMUSCULAR | Status: AC
  Administered 2016-11-19: 4 mg via INTRAVENOUS

## 2016-11-19 MED ORDER — MORPHINE SULFATE (PF) 4 MG/ML IV SOLN
4.0000 mg | Freq: Once | INTRAVENOUS | Status: AC
  Administered 2016-11-19: 4 mg via INTRAVENOUS

## 2016-11-19 MED ORDER — ONDANSETRON HCL 4 MG/2ML IJ SOLN
INTRAMUSCULAR | Status: AC
  Filled 2016-11-19: qty 2

## 2016-11-19 NOTE — H&P (Signed)
History and Physical   [facility] - [place] @ [facility] Admission History and Physical [employer], D.O.    Patient Name: Jonathon Graham MR#: [id number] Date of Birth: [birth year] Date of Admission: 11/19/2016  Referring MD/NP/PA: Dr. Lenard Lance Primary Care Physician: Lorre Munroe, NP Patient coming from: Home   Chief Complaint:  Chief Complaint  Patient presents with  . Shoulder Pain  Please note the entire history is obtained from the patient's emergency department chart, emergency department provider and prior records.  Patient's personal history is limited by Somnolence, sedation.  HPI: Jonathon Graham is a 61 y.o. male with a known history of COPD, depression, diabetes, hypertension, hyperlipidemia, presents to the emergency department for evaluation of arm pain, SOB.  Patient was in a usual state of health until several days ago when he described the onset of right shoulder and chest pain. . Patient was seen in the [facility] emergency Department today with x-rays for the right upper extremity. His diagnosis of bone spurs and prescribed tramadol. In the emergency department he was found to be hypoxic at 89% on room air Patient denies fevers/chills, weakness, dizziness, chest pain, shortness of breath, N/V/C/D, abdominal pain, dysuria/frequency, changes in mental status.    EMS/ED Course: Patient received DuoNeb 2, slight Medrol 125, morphine 4 mg and Zofran 4 mg. Medical admission was requested for further management of respiratory failure with hypoxia secondary to presumed COPD exacerbation..  Review of Systems:  Unable to obtain secondary to somnolence, sedation   Past Medical History:  Diagnosis Date  . Arthritis   . Benign prostatic hyperplasia (BPH) with urinary urgency    nodular  . COPD (chronic obstructive pulmonary disease) (HCC)   . Depression   . Diabetes mellitus without complication (HCC)   . Essential hypertension   . Fibromyalgia, primary   .  Hyperlipidemia   . Hypertension   . Obesity     No past surgical history on file.   reports that he has been smoking Cigarettes.  He has never used smokeless tobacco. He reports that he does not drink alcohol or use drugs.  Allergies  Allergen Reactions  . Penicillins Anaphylaxis    Family History  Problem Relation Age of Onset  . Heart disease Father   . Diabetes Mellitus II Sister   . Myasthenia gravis Sister   . Fibrocystic breast disease Sister   . Hypertension Sister   . Diabetes Mellitus II Brother     Prior to Admission medications   Medication Sig Start Date End Date Taking? Authorizing Provider  acetaminophen (TYLENOL) 500 MG tablet Take 1 tablet (500 mg total) by mouth every 8 (eight) hours as needed. 01/05/16   Krebs, Laurel Dimmer, NP  albuterol (PROVENTIL) (2.5 MG/3ML) 0.083% nebulizer solution Take 3 mLs (2.5 mg total) by nebulization every 6 (six) hours as needed for wheezing or shortness of breath. 06/29/16   Lorre Munroe, NP  aspirin 81 MG tablet Take 81 mg by mouth daily. Takes 4 81mg  aspirin daily.    [provider]  atorvastatin (LIPITOR) 20 MG tablet Take 1 tablet (20 mg total) by mouth daily. 01/13/16   Loura Pardon, NP  diclofenac sodium (VOLTAREN) 1 % GEL Apply 2 g topically 4 (four) times daily. 01/05/16   Loura Pardon, NP  Elastic Bandages & Supports (MEDICAL COMPRESSION SOCKS) MISC 1 each by Does not apply route daily. 01/05/16   Loura Pardon, NP  furosemide (LASIX) 40 MG tablet Take 1 tablet (40 mg  total) by mouth daily. 10/31/16   Lorre Munroe, NP  gabapentin (NEURONTIN) 100 MG capsule Take 3 capsules, three times a day.  Follow written instructions to taper up to this dose. 11/26/15   Janeann Forehand., MD  hydrochlorothiazide (MICROZIDE) 12.5 MG capsule Take 1 capsule (12.5 mg total) by mouth daily. 03/14/16   Lorre Munroe, NP  indomethacin (INDOCIN) 50 MG capsule Take 1 capsule (50 mg total) by mouth 3 (three) times daily as  needed. 01/13/16   Darci Current, MD  metFORMIN (GLUCOPHAGE) 1000 MG tablet Take 1 tablet (1,000 mg total) by mouth 2 (two) times daily with a meal. 11/26/15   Janeann Forehand., MD  phentermine 37.5 MG capsule Take 1 capsule (37.5 mg total) by mouth every morning. 05/26/16   Lorre Munroe, NP  tamsulosin (FLOMAX) 0.4 MG CAPS capsule Take 0.4 mg by mouth.    [provider]    Physical Exam: Vitals:   11/19/16 2053 11/19/16 2055 11/19/16 2325  BP:  (!) 165/98 (!) 148/104  Pulse:  79   Resp: 20 (!) 24 18  Temp:  98.2 F (36.8 C) 98.3 F (36.8 C)  TempSrc: Oral Oral   SpO2:  (!) 89% (!) 80%  Weight: (!) 149.7 kg (330 lb)    Height: 5\' 8"  (1.727 m)      GENERAL: 61 y.o.-year-old obese male patient, Somnolence, lying in the bed in no acute distress. BiPAP in place HEENT: Head atraumatic, normocephalic. Pupils equal, round, reactive to light and accommodation.  Mucus membranes moist. NECK: Supple. CHEST: Expiratory wheezing.  No use of accessory muscles of respiration.  No reproducible chest wall tenderness.  CARDIOVASCULAR: S1, S2 normal. No murmurs, rubs, or gallops. Cap refill <2 seconds. Pulses intact distally.  ABDOMEN: Soft, nondistended, nontender. No rebound, guarding, rigidity. Normoactive bowel sounds present in all four quadrants. No organomegaly or mass. EXTREMITIES: No swelling or deformity to the right upper extremity. Mild bilateral pedal edema . No calf tenderness or Homan's sign.  NEUROLOGIC: The patient is alert and oriented x 3. Cranial nerves II through XII are grossly intact with no focal sensorimotor deficit. Muscle strength 5/5 in all extremities. Sensation intact. Gait not checked. PSYCHIATRIC:  Normal affect, mood, thought content. SKIN: Warm, dry, and intact without obvious rash, lesion, or ulcer.    Labs on Admission:  CBC:  Recent Labs Lab 11/19/16 2119  WBC 9.9  HGB 14.8  HCT 44.9  MCV 85.3  PLT 190   Basic Metabolic  Panel:  Recent Labs Lab 11/19/16 2119  NA 137  K 4.1  CL 99*  CO2 33*  GLUCOSE 138*  BUN 17  CREATININE 0.79  CALCIUM 8.8*   GFR: Estimated Creatinine Clearance: 143.6 mL/min (by C-G formula based on SCr of 0.79 mg/dL). Liver Function Tests:  Recent Labs Lab 11/19/16 2119  AST 20  ALT 17  ALKPHOS 46  BILITOT 0.8  PROT 7.4  ALBUMIN 4.0   No results for input(s): LIPASE, AMYLASE in the last 168 hours. No results for input(s): AMMONIA in the last 168 hours. Coagulation Profile: No results for input(s): INR, PROTIME in the last 168 hours. Cardiac Enzymes:  Recent Labs Lab 11/19/16 2119  TROPONINI <0.03   BNP (last 3 results) No results for input(s): PROBNP in the last 8760 hours. HbA1C: No results for input(s): HGBA1C in the last 72 hours. CBG: No results for input(s): GLUCAP in the last 168 hours. Lipid Profile: No results for input(s):  CHOL, HDL, LDLCALC, TRIG, CHOLHDL, LDLDIRECT in the last 72 hours. Thyroid Function Tests: No results for input(s): TSH, T4TOTAL, FREET4, T3FREE, THYROIDAB in the last 72 hours. Anemia Panel: No results for input(s): VITAMINB12, FOLATE, FERRITIN, TIBC, IRON, RETICCTPCT in the last 72 hours. Urine analysis:    Component Value Date/Time   COLORURINE YELLOW (A) 02/05/2015 1056   APPEARANCEUR CLEAR (A) 02/05/2015 1056   LABSPEC 1.026 02/05/2015 1056   PHURINE 6.0 02/05/2015 1056   GLUCOSEU NEGATIVE 02/05/2015 1056   HGBUR NEGATIVE 02/05/2015 1056   BILIRUBINUR negative 06/15/2015 0847   KETONESUR NEGATIVE 02/05/2015 1056   PROTEINUR negative 06/15/2015 0847   PROTEINUR NEGATIVE 02/05/2015 1056   UROBILINOGEN negative 06/15/2015 0847   NITRITE negative 06/15/2015 0847   NITRITE NEGATIVE 02/05/2015 1056   LEUKOCYTESUR Negative 06/15/2015 0847   Sepsis Labs: @LABRCNTIP (procalcitonin:4,lacticidven:4) )No results found for this or any previous visit (from the past 240 hour(s)).   Radiological Exams on Admission: Dg Chest 2  View  Result Date: 11/19/2016 CLINICAL DATA:  Right shoulder and arm pain EXAM: CHEST  2 VIEW COMPARISON:  04/07/2015 FINDINGS: Linear atelectasis at the left lung base. No focal consolidation or pleural effusion. Mild cardiomegaly without overt failure. No pneumothorax. Left AC joint degenerative change IMPRESSION: 1. Mild atelectasis at the left base 2. Cardiomegaly without edema Electronically Signed   By: Jasmine Pang M.D.   On: 11/19/2016 22:04   Ct Angio Chest Pe W And/or Wo Contrast  Result Date: 11/19/2016 CLINICAL DATA:  Patient reports right shoulder pain that radiates down into right arm and now having hypoxia and right chest pain. Reports seen at [facility] earlier today for same and diagnosed with "spurs" and prescribed tramadol. Patient reports pain medication is not helping. EXAM: CT ANGIOGRAPHY CHEST WITH CONTRAST TECHNIQUE: Multidetector CT imaging of the chest was performed using the standard protocol during bolus administration of intravenous contrast. Multiplanar CT image reconstructions and MIPs were obtained to evaluate the vascular anatomy. Study repeated due to initial poor pulmonary artery opacification. CONTRAST:  100 mL of Isovue 370 intravenous contrast COMPARISON:  Current chest radiograph FINDINGS: Cardiovascular: There is satisfactory opacification the pulmonary arteries to the segmental level. Study is somewhat degraded due to respiratory motion mostly affecting the lung bases. There is no evidence of a pulmonary embolism. Heart is mildly enlarged. There are no significant coronary artery calcifications. The thoracic aorta is normal caliber. No dissection. Minor atherosclerotic calcifications noted along the distal arch. Mediastinum/Nodes: No enlarged mediastinal, hilar, or axillary lymph nodes. Thyroid gland, trachea, and esophagus demonstrate no significant findings. Lungs/Pleura: Mild subsegmental atelectasis. Subtle mosaic attenuation most likely from small airways disease. No  evidence of pneumonia or pulmonary edema. No mass or suspicious nodule. No pleural effusion or pneumothorax. Upper Abdomen: No acute abnormality. Musculoskeletal: No fracture or acute finding. No osteoblastic or osteolytic lesions. Review of the MIP images confirms the above findings. IMPRESSION: 1. No evidence of a pulmonary embolism. 2. No convincing pneumonia.  No pulmonary edema. 3. Subsegmental atelectasis. Subtle mosaic attenuation consistent with small airways disease and air trapping. 4. Mild cardiomegaly. Aortic aneurysm NOS (ICD10-I71.9). Electronically Signed   By: Amie Portland M.D.   On: 11/19/2016 23:17   US Venous Img Upper Uni Right  Result Date: 11/19/2016 CLINICAL DATA:  Right shoulder and arm pain for 3 days. EXAM: RIGHT UPPER EXTREMITY VENOUS DOPPLER ULTRASOUND TECHNIQUE: Gray-scale sonography with graded compression, as well as color Doppler and duplex ultrasound were performed to evaluate the upper extremity deep venous system  from the level of the subclavian vein and including the jugular, axillary, basilic, radial, ulnar and upper cephalic vein. Spectral Doppler was utilized to evaluate flow at rest and with distal augmentation maneuvers. COMPARISON:  None. FINDINGS: Contralateral Subclavian Vein: Respiratory phasicity is normal and symmetric with the symptomatic side. No evidence of thrombus. Normal compressibility. Internal Jugular Vein: No evidence of thrombus. Normal compressibility, respiratory phasicity and response to augmentation. Subclavian Vein: No evidence of thrombus. Normal compressibility, respiratory phasicity and response to augmentation. Axillary Vein: No evidence of thrombus. Normal compressibility, respiratory phasicity and response to augmentation. Cephalic Vein: No evidence of thrombus. Normal compressibility, respiratory phasicity and response to augmentation. Basilic Vein: No evidence of thrombus. Normal compressibility, respiratory phasicity and response to  augmentation. Brachial Veins: No evidence of thrombus. Normal compressibility, respiratory phasicity and response to augmentation. Radial Veins: No evidence of thrombus. Normal compressibility, respiratory phasicity and response to augmentation. Ulnar Veins: No evidence of thrombus. Normal compressibility, respiratory phasicity and response to augmentation. Venous Reflux:  None visualized. Other Findings:  None visualized. IMPRESSION: No evidence of DVT within the right upper extremity. Electronically Signed   By: Amie Portlandavid  Ormond M.D.   On: 11/19/2016 22:33   EKG: Normal sinus rhythm at 74 bpm with normal axis, prolonged QT, RBBB and nonspecific ST-T wave changes. EKG unchanged from prior.  Assessment/Plan  This is a 61 y.o. male with a history of COPD, depression, diabetes, hypertension, hyperlipidemia, now being admitted with:  #. Acute exacerbation of COPD - Admit inpatient - IV steroids and azithromycin - Nebulizers, O2 therapy and expectorants as needed.  - Continuous pulse oximetry - CPAP as per home regimen - Consider pulmonary consult if not improving.   #. Chest pain rule out ACS, atypical - Monitor on tele -Continue aspirin and statin - Trend troponins, check lipids and TSH. - Morphine, nitro, beta blocker added - Check echo - Consider cardiology consultation  #. Right shoulder and arm pain, likely musculoskeletal -Continue indomethacin, Tylenol, Voltaren gel  #. History of hypertension -Continue Lasix, HCTZ  #. History of hyperlipidemia - Continue atorvastatin  #. History of DM - Accuchecks achs with RISS coverage - Heart healthy, carb controlled diet  #. History of BPH - Continue Flomax  Admission status: Inpatient, telemetry IV Fluids: HL Diet/Nutrition: HH, CC Consults called: None  DVT Px: Lovenox, SCDs and early ambulation. Code Status: Full Code  Disposition Plan: To home in 1-2 days  All the records are reviewed and case discussed with ED provider.  Management plans discussed with the patient and/or family who express understanding and agree with plan of care.  Giani Winther D.O. on 11/19/2016 at 11:40 PM Between 7am to 6pm - Pager - [phone] After 6pm go to [email or web address] - Social research officer, governmentpassword EPAS [facility] [place] [facility] Office [phone] CC: Primary care physician; Lorre MunroeBaity, Regina W, NP   11/19/2016, 11:40 PM

## 2016-11-19 NOTE — ED Provider Notes (Signed)
[facility] Emergency Department Provider Note  Time seen: 9:50 PM  I have reviewed the triage vital signs and the nursing notes.   HISTORY  Chief Complaint Shoulder Pain    HPI Cortland Crehan is a 61 y.o. male with a past medical history of COPD, depression, diabetes, hypertension, hyperlipidemia, presents to the emergency department for right arm pain and shortness of breath.According to the patient the past several days he has been experiencing right arm pain starting from a shoulder and somewhat in his right chest. Patient states the pain goes all the way down the right arm. Patient also believes that the right arm is swollen compared to the left. Patient with [facility] emergency department today for the same had an x-ray performed and was diagnosed with possible bone spurs of the right shoulder and prescribed tramadol. Patient states the tramadol is not helping and he continues with the right arm pain and shortness of breaths a came to the emergency department. Here upon arrival the patient is hypoxic 89% on room air with no home O2 requirement. Patient states a remote history of a DVT in the past. Does not take any blood thinners. Patient denies any chest pain at this time but does state right shoulder and right arm pain with some mild shortness of breath. Denies any nausea or diaphoresis. States the arm pain is worse with movement of the right arm or palpation of the right arm.  Past Medical History:  Diagnosis Date  . Arthritis   . Benign prostatic hyperplasia (BPH) with urinary urgency    nodular  . COPD (chronic obstructive pulmonary disease) (HCC)   . Depression   . Diabetes mellitus without complication (HCC)   . Essential hypertension   . Fibromyalgia, primary   . Hyperlipidemia   . Hypertension   . Obesity     Patient Active Problem List   Diagnosis Date Noted  . Arthritis 03/14/2016  . BPH (benign prostatic hyperplasia) 03/14/2016  . Depression  03/14/2016  . HTN (hypertension) 03/14/2016  . Fibromyalgia 03/14/2016  . HLD (hyperlipidemia) 03/14/2016  . Insomnia 03/14/2016  . COPD (chronic obstructive pulmonary disease) (HCC) 11/26/2015  . Severe obstructive sleep apnea 06/15/2015  . Tobacco use 06/15/2015  . BMI 50.0-59.9, adult (HCC) 05/26/2015  . Type 2 diabetes mellitus (HCC) 05/04/2015    No past surgical history on file.  Prior to Admission medications   Medication Sig Start Date End Date Taking? Authorizing Provider  acetaminophen (TYLENOL) 500 MG tablet Take 1 tablet (500 mg total) by mouth every 8 (eight) hours as needed. 01/05/16   Krebs, Laurel Dimmer, NP  albuterol (PROVENTIL) (2.5 MG/3ML) 0.083% nebulizer solution Take 3 mLs (2.5 mg total) by nebulization every 6 (six) hours as needed for wheezing or shortness of breath. 06/29/16   Lorre Munroe, NP  aspirin 81 MG tablet Take 81 mg by mouth daily. Takes 4 81mg  aspirin daily.    [provider]  atorvastatin (LIPITOR) 20 MG tablet Take 1 tablet (20 mg total) by mouth daily. 01/13/16   Loura Pardon, NP  diclofenac sodium (VOLTAREN) 1 % GEL Apply 2 g topically 4 (four) times daily. 01/05/16   Loura Pardon, NP  Elastic Bandages & Supports (MEDICAL COMPRESSION SOCKS) MISC 1 each by Does not apply route daily. 01/05/16   Loura Pardon, NP  furosemide (LASIX) 40 MG tablet Take 1 tablet (40 mg total) by mouth daily. 10/31/16   Lorre Munroe, NP  gabapentin (NEURONTIN) 100  MG capsule Take 3 capsules, three times a day.  Follow written instructions to taper up to this dose. 11/26/15   Janeann Forehand., MD  hydrochlorothiazide (MICROZIDE) 12.5 MG capsule Take 1 capsule (12.5 mg total) by mouth daily. 03/14/16   Lorre Munroe, NP  indomethacin (INDOCIN) 50 MG capsule Take 1 capsule (50 mg total) by mouth 3 (three) times daily as needed. 01/13/16   Darci Current, MD  metFORMIN (GLUCOPHAGE) 1000 MG tablet Take 1 tablet (1,000 mg total) by mouth 2 (two) times  daily with a meal. 11/26/15   Janeann Forehand., MD  phentermine 37.5 MG capsule Take 1 capsule (37.5 mg total) by mouth every morning. 05/26/16   Lorre Munroe, NP  tamsulosin (FLOMAX) 0.4 MG CAPS capsule Take 0.4 mg by mouth.    [provider]    Allergies  Allergen Reactions  . Penicillins Anaphylaxis    Family History  Problem Relation Age of Onset  . Heart disease Father   . Diabetes Mellitus II Sister   . Myasthenia gravis Sister   . Fibrocystic breast disease Sister   . Hypertension Sister   . Diabetes Mellitus II Brother     Social History Social History  Substance Use Topics  . Smoking status: Current Some Day Smoker    Types: Cigarettes    Last attempt to quit: 04/04/2015  . Smokeless tobacco: Never Used     Comment: 3-4 daily  . Alcohol use No    Review of Systems Constitutional: Negative for fever Cardiovascular: Negative for chest pain. Respiratory: States mild shortness of breath. Gastrointestinal: Negative for abdominal pain Musculoskeletal: Positive for right arm pain and swelling. Skin: Negative for rash. Neurological: Negative for headache All other ROS negative  ____________________________________________   PHYSICAL EXAM:  VITAL SIGNS: ED Triage Vitals  Enc Vitals Group     BP 11/19/16 2055 (!) 165/98     Pulse Rate 11/19/16 2055 79     Resp 11/19/16 2053 20     Temp 11/19/16 2055 98.2 F (36.8 C)     Temp Source 11/19/16 2053 Oral     SpO2 11/19/16 2055 (!) 89 %     Weight 11/19/16 2053 (!) 330 lb (149.7 kg)     Height 11/19/16 2053 5\' 8"  (1.727 m)     Head Circumference --      Peak Flow --      Pain Score 11/19/16 2053 10     Pain Loc --      Pain Edu? --      Excl. in GC? --     Constitutional: Alert and oriented. Well appearing and in no distress.Obese. Eyes: Normal exam ENT   Head: Normocephalic and atraumatic.   Mouth/Throat: Mucous membranes are moist. Cardiovascular: Normal rate, regular rhythm. No  murmur Respiratory: Normal respiratory effort without tachypnea nor retractions. Slight expiratory wheeze bilaterally. No obvious rales or rhonchi. Gastrointestinal: Soft and nontender. No distention. Musculoskeletal: Patient has mild to moderate tenderness palpation of the right bicep and forearm. No appreciable edema, 2+ radial pulse. Good range of motion but does have increased pain with range of motion. Lower extremity edema equal bilaterally, Larson unchanged per patient. Neurologic:  Normal speech and language. No gross focal neurologic deficits Skin:  Skin is warm, dry and intact.  Psychiatric: Mood and affect are normal.  ____________________________________________    EKG  EKG reviewed and interpreted by myself shows normal sinus rhythm at 74 bpm, slightly widened QRS, normal  axis, prolonged QTC of 518 ms, nonspecific ST changes. RSR most consistent with right bundle branch block. Inferolateral T wave inversions. EKG is abnormal but largely unchanged from 01/13/16.  ____________________________________________    RADIOLOGY  Ultrasound negative for DVT. CT angiography negative for PE  ____________________________________________   INITIAL IMPRESSION / ASSESSMENT AND PLAN / ED COURSE  Pertinent labs & imaging results that were available during my care of the patient were reviewed by me and considered in my medical decision making (see chart for details).  Patient presents for right arm pain and swelling states mild shortness breath. Patient is hypoxic 89% on room air with no home O2 requirement. Given the patient's hypoxia with right arm pain and swelling we will obtain an ultrasound of the right upper extremity as well as a CT scan of the chest with contrast to rule out PE. Patient does have mild expiratory wheeze. We will treat with a DuoNeb.  Patient's ultrasound is negative for DVT. At this time is not clear whether the patient is having right arm discomfort. His labs  including troponin are normal. CT angiography is negative for PE. Patient continues to be hypoxic maintains around 85% on room air, 89% on 2 L, will occasionally dip down as low as 69% with a good waveform. Patient states he normally wears CPAP at night. Does not wear oxygen at home. We will place the patient on BiPAP as he is currently satting in the 70s despite nasal oxygen. We will dose Solu-Medrol as well as DuoNeb's. Patient will be admitted to the hospital for further treatment.    ____________________________________________   FINAL CLINICAL IMPRESSION(S) / ED DIAGNOSES  Right arm pain Shortness of breath COPD exacerbation Hypoxia   Minna AntisPaduchowski, Coburn Knaus, MD 11/19/16 2328

## 2016-11-19 NOTE — ED Notes (Signed)
Patient presents for right shoulder and arm pain. States [facility] and given pain medicine but it is not helpful and wants to be seen again. Denies shortness of breath or CP at this time.

## 2016-11-19 NOTE — ED Notes (Signed)
Nurse went into access patient because 02 sat was in the 70's. Nurse repositioned patient and placed [place] in mouth rather than nose because patient is a mouth breather. Pt has a c-pap at home. Pt was given morphine 45 min before and seemed to be sleepy from that.

## 2016-11-19 NOTE — ED Triage Notes (Signed)
Patient reports right shoulder pain that radiates down into right arm.  Reports seen at [facility] earlier today for same and diagnosed with "spurs" and prescribed tramadol.  Patient reports pain medication is not helping.

## 2016-11-19 NOTE — ED Notes (Signed)
Patient with Pulse oxi of 89% on room air.  Patient denies any type of breathing problems.  Reports sats were the same [facility] but it was not addressed.

## 2016-11-19 NOTE — ED Notes (Signed)
Pt placed on 2L [place] because patient was sat 89% on RA. Patient now at 95% on 2L [place]. Patient has hx of COPD but does not wear o2 at home but does give himself occasional breathing treatments. Pt complained of right shoulder pain. Denies any injury.

## 2016-11-20 ENCOUNTER — Inpatient Hospital Stay
Admit: 2016-11-20 | Discharge: 2016-11-20 | Disposition: A | Discharge status: Home / Self Care | Payer: BC Managed Care – PPO | Attending: Family Medicine | Admitting: Family Medicine

## 2016-11-20 ENCOUNTER — Encounter: Payer: Self-pay | Admitting: *Deleted

## 2016-11-20 DIAGNOSIS — I110 Hypertensive heart disease with heart failure: Secondary | ICD-10-CM | POA: Diagnosis present

## 2016-11-20 DIAGNOSIS — G4733 Obstructive sleep apnea (adult) (pediatric): Secondary | ICD-10-CM | POA: Diagnosis present

## 2016-11-20 DIAGNOSIS — M25511 Pain in right shoulder: Secondary | ICD-10-CM | POA: Diagnosis present

## 2016-11-20 DIAGNOSIS — Z7982 Long term (current) use of aspirin: Secondary | ICD-10-CM | POA: Diagnosis not present

## 2016-11-20 DIAGNOSIS — I5030 Unspecified diastolic (congestive) heart failure: Secondary | ICD-10-CM | POA: Diagnosis present

## 2016-11-20 DIAGNOSIS — E119 Type 2 diabetes mellitus without complications: Secondary | ICD-10-CM | POA: Diagnosis present

## 2016-11-20 DIAGNOSIS — E785 Hyperlipidemia, unspecified: Secondary | ICD-10-CM | POA: Diagnosis present

## 2016-11-20 DIAGNOSIS — F17210 Nicotine dependence, cigarettes, uncomplicated: Secondary | ICD-10-CM | POA: Diagnosis present

## 2016-11-20 DIAGNOSIS — J441 Chronic obstructive pulmonary disease with (acute) exacerbation: Secondary | ICD-10-CM | POA: Diagnosis present

## 2016-11-20 DIAGNOSIS — Z9119 Patient's noncompliance with other medical treatment and regimen: Secondary | ICD-10-CM | POA: Diagnosis not present

## 2016-11-20 DIAGNOSIS — Z7984 Long term (current) use of oral hypoglycemic drugs: Secondary | ICD-10-CM | POA: Diagnosis not present

## 2016-11-20 DIAGNOSIS — J9621 Acute and chronic respiratory failure with hypoxia: Secondary | ICD-10-CM | POA: Diagnosis present

## 2016-11-20 DIAGNOSIS — J9601 Acute respiratory failure with hypoxia: Secondary | ICD-10-CM | POA: Diagnosis not present

## 2016-11-20 DIAGNOSIS — Z6841 Body Mass Index (BMI) 40.0 and over, adult: Secondary | ICD-10-CM | POA: Diagnosis not present

## 2016-11-20 DIAGNOSIS — F329 Major depressive disorder, single episode, unspecified: Secondary | ICD-10-CM | POA: Diagnosis present

## 2016-11-20 DIAGNOSIS — M797 Fibromyalgia: Secondary | ICD-10-CM | POA: Diagnosis present

## 2016-11-20 DIAGNOSIS — R0902 Hypoxemia: Secondary | ICD-10-CM | POA: Diagnosis present

## 2016-11-20 DIAGNOSIS — R3915 Urgency of urination: Secondary | ICD-10-CM | POA: Diagnosis present

## 2016-11-20 DIAGNOSIS — Z79899 Other long term (current) drug therapy: Secondary | ICD-10-CM | POA: Diagnosis not present

## 2016-11-20 DIAGNOSIS — N401 Enlarged prostate with lower urinary tract symptoms: Secondary | ICD-10-CM | POA: Diagnosis present

## 2016-11-20 LAB — BLOOD GAS, ARTERIAL
Acid-Base Excess: 5 mmol/L — ABNORMAL HIGH (ref 0.0–2.0)
Acid-Base Excess: 3.8 mmol/L — ABNORMAL HIGH (ref 0.0–2.0)
Acid-Base Excess: 5.9 mmol/L — ABNORMAL HIGH (ref 0.0–2.0)
Bicarbonate: 37.7 mmol/L — ABNORMAL HIGH (ref 20.0–28.0)
Bicarbonate: 36.2 mmol/L — ABNORMAL HIGH (ref 20.0–28.0)
Bicarbonate: 36.8 mmol/L — ABNORMAL HIGH (ref 20.0–28.0)
Delivery systems: POSITIVE
Expiratory PAP: 5
Expiratory PAP: 5
Expiratory PAP: 5
FIO2: 0.8
FIO2: 0.7
FIO2: 0.6
Inspiratory PAP: 15
Inspiratory PAP: 25
Inspiratory PAP: 25
Mechanical Rate: 8
Mechanical Rate: 20
Mode: POSITIVE
Mode: POSITIVE
O2 Saturation: 95.5 %
O2 Saturation: 92.8 %
O2 Saturation: 92.6 %
Patient temperature: 37
Patient temperature: 37
Patient temperature: 37
pCO2 arterial: 101 mmHg (ref 32.0–48.0)
pCO2 arterial: 97 mmHg (ref 32.0–48.0)
pCO2 arterial: 84 mmHg (ref 32.0–48.0)
pH, Arterial: 7.18 — CL (ref 7.350–7.450)
pH, Arterial: 7.18 — CL (ref 7.350–7.450)
pH, Arterial: 7.25 — ABNORMAL LOW (ref 7.350–7.450)
pO2, Arterial: 96 mmHg (ref 83.0–108.0)
pO2, Arterial: 82 mmHg — ABNORMAL LOW (ref 83.0–108.0)
pO2, Arterial: 76 mmHg — ABNORMAL LOW (ref 83.0–108.0)

## 2016-11-20 LAB — LIPID PANEL
Cholesterol: 147 mg/dL (ref 0–200)
HDL: 46 mg/dL (ref >40)
LDL Cholesterol: 93 mg/dL (ref 0–99)
Total CHOL/HDL Ratio: 3.2 RATIO
Triglycerides: 41 mg/dL (ref <150)
VLDL: 8 mg/dL (ref 0–40)

## 2016-11-20 LAB — BASIC METABOLIC PANEL
Anion gap: 4 — ABNORMAL LOW (ref 5–15)
BUN: 17 mg/dL (ref 6–20)
CO2: 34 mmol/L — ABNORMAL HIGH (ref 22–32)
Calcium: 8.7 mg/dL — ABNORMAL LOW (ref 8.9–10.3)
Chloride: 100 mmol/L — ABNORMAL LOW (ref 101–111)
Creatinine, Ser: 0.84 mg/dL (ref 0.61–1.24)
GFR calc Af Amer: >60 mL/min (ref >60)
GFR calc non Af Amer: >60 mL/min (ref >60)
Glucose, Bld: 183 mg/dL — ABNORMAL HIGH (ref 65–99)
Potassium: 5 mmol/L (ref 3.5–5.1)
Sodium: 138 mmol/L (ref 135–145)

## 2016-11-20 LAB — MRSA PCR SCREENING: MRSA by PCR: NEGATIVE

## 2016-11-20 LAB — GLUCOSE, CAPILLARY
Glucose-Capillary: 170 mg/dL — ABNORMAL HIGH (ref 65–99)
Glucose-Capillary: 178 mg/dL — ABNORMAL HIGH (ref 65–99)
Glucose-Capillary: 181 mg/dL — ABNORMAL HIGH (ref 65–99)
Glucose-Capillary: 209 mg/dL — ABNORMAL HIGH (ref 65–99)
Glucose-Capillary: 212 mg/dL — ABNORMAL HIGH (ref 65–99)
Glucose-Capillary: 224 mg/dL — ABNORMAL HIGH (ref 65–99)
Glucose-Capillary: 239 mg/dL — ABNORMAL HIGH (ref 65–99)

## 2016-11-20 LAB — BRAIN NATRIURETIC PEPTIDE: B Natriuretic Peptide: 206 pg/mL — ABNORMAL HIGH (ref 0.0–100.0)

## 2016-11-20 LAB — CBC
HCT: 47.4 % (ref 40.0–52.0)
Hemoglobin: 15.2 g/dL (ref 13.0–18.0)
MCH: 28.5 pg (ref 26.0–34.0)
MCHC: 32.1 g/dL (ref 32.0–36.0)
MCV: 88.8 fL (ref 80.0–100.0)
Platelets: 173 10*3/uL (ref 150–440)
RBC: 5.34 MIL/uL (ref 4.40–5.90)
RDW: 16.6 % — ABNORMAL HIGH (ref 11.5–14.5)
WBC: 10.2 10*3/uL (ref 3.8–10.6)

## 2016-11-20 LAB — ECHOCARDIOGRAM COMPLETE
Height: 68 in
Weight: 5530.9 oz

## 2016-11-20 LAB — TROPONIN I
Troponin I: 0.03 ng/mL (ref <0.03)
Troponin I: 0.04 ng/mL (ref <0.03)
Troponin I: 0.04 ng/mL (ref <0.03)

## 2016-11-20 LAB — TSH: TSH: 0.385 u[IU]/mL (ref 0.350–4.500)

## 2016-11-20 LAB — PROCALCITONIN: Procalcitonin: <0.1 ng/mL

## 2016-11-20 MED ORDER — MAGNESIUM CITRATE PO SOLN
1.0000 | Freq: Once | ORAL | Status: DC | PRN
  Filled 2016-11-20: qty 296

## 2016-11-20 MED ORDER — IPRATROPIUM-ALBUTEROL 0.5-2.5 (3) MG/3ML IN SOLN
3.0000 mL | Freq: Once | RESPIRATORY_TRACT | Status: AC
  Administered 2016-11-20: 3 mL via RESPIRATORY_TRACT

## 2016-11-20 MED ORDER — OXYCODONE HCL 5 MG PO TABS: 5.0000 mg | ORAL_TABLET | ORAL | Status: DC | PRN

## 2016-11-20 MED ORDER — IPRATROPIUM-ALBUTEROL 0.5-2.5 (3) MG/3ML IN SOLN
3.0000 mL | RESPIRATORY_TRACT | Status: DC
Start: 2016-11-20 — End: 2016-11-21
  Administered 2016-11-20 – 2016-11-21 (×6): 3 mL via RESPIRATORY_TRACT
  Filled 2016-11-20 (×7): qty 3

## 2016-11-20 MED ORDER — SENNOSIDES-DOCUSATE SODIUM 8.6-50 MG PO TABS
1.0000 | ORAL_TABLET | Freq: Every evening | ORAL | Status: DC | PRN
  Filled 2016-11-20: qty 1

## 2016-11-20 MED ORDER — ALBUTEROL SULFATE (2.5 MG/3ML) 0.083% IN NEBU
2.5000 mg | INHALATION_SOLUTION | RESPIRATORY_TRACT | Status: DC | PRN
  Filled 2016-11-20: qty 3

## 2016-11-20 MED ORDER — INSULIN ASPART 100 UNIT/ML ~~LOC~~ SOLN
0.0000 [IU] | Freq: Every day | SUBCUTANEOUS | Status: DC
  Administered 2016-11-21: 2 [IU] via SUBCUTANEOUS
  Filled 2016-11-20: qty 2

## 2016-11-20 MED ORDER — IPRATROPIUM BROMIDE 0.02 % IN SOLN
0.5000 mg | RESPIRATORY_TRACT | Status: DC | PRN
  Filled 2016-11-20: qty 2.5

## 2016-11-20 MED ORDER — ACETAMINOPHEN 325 MG PO TABS
650.0000 mg | ORAL_TABLET | Freq: Four times a day (QID) | ORAL | Status: DC | PRN
  Filled 2016-11-20: qty 2

## 2016-11-20 MED ORDER — ORAL CARE MOUTH RINSE
15.0000 mL | Freq: Two times a day (BID) | OROMUCOSAL | Status: DC
  Administered 2016-11-20 – 2016-11-21 (×4): 15 mL via OROMUCOSAL

## 2016-11-20 MED ORDER — ONDANSETRON HCL 4 MG/2ML IJ SOLN
4.0000 mg | Freq: Four times a day (QID) | INTRAMUSCULAR | Status: DC | PRN
  Filled 2016-11-20: qty 2

## 2016-11-20 MED ORDER — MORPHINE SULFATE (PF) 2 MG/ML IV SOLN: 1.0000 mg | INTRAVENOUS | Status: DC | PRN

## 2016-11-20 MED ORDER — ACETAMINOPHEN 650 MG RE SUPP
650.0000 mg | Freq: Four times a day (QID) | RECTAL | Status: DC | PRN
  Filled 2016-11-20: qty 1

## 2016-11-20 MED ORDER — DOXYCYCLINE HYCLATE 100 MG IV SOLR
100.0000 mg | Freq: Two times a day (BID) | INTRAVENOUS | Status: DC
  Administered 2016-11-20 (×2): 100 mg via INTRAVENOUS
  Filled 2016-11-20 (×3): qty 100

## 2016-11-20 MED ORDER — ENOXAPARIN SODIUM 40 MG/0.4ML ~~LOC~~ SOLN
40.0000 mg | Freq: Two times a day (BID) | SUBCUTANEOUS | Status: DC
Start: 2016-11-20 — End: 2016-11-22
  Administered 2016-11-20 – 2016-11-21 (×4): 40 mg via SUBCUTANEOUS
  Filled 2016-11-20 (×6): qty 0.4

## 2016-11-20 MED ORDER — DEXTROSE 5 % IV SOLN: 500.0000 mg | INTRAVENOUS | Status: DC

## 2016-11-20 MED ORDER — NITROGLYCERIN 0.4 MG SL SUBL: 0.4000 mg | SUBLINGUAL_TABLET | SUBLINGUAL | Status: DC | PRN

## 2016-11-20 MED ORDER — CHLORHEXIDINE GLUCONATE 0.12 % MT SOLN
15.0000 mL | Freq: Two times a day (BID) | OROMUCOSAL | Status: DC
  Administered 2016-11-20 – 2016-11-22 (×5): 15 mL via OROMUCOSAL
  Filled 2016-11-20 (×6): qty 15

## 2016-11-20 MED ORDER — SODIUM CHLORIDE 0.9% FLUSH
3.0000 mL | Freq: Two times a day (BID) | INTRAVENOUS | Status: DC
  Administered 2016-11-20 – 2016-11-22 (×6): 3 mL via INTRAVENOUS

## 2016-11-20 MED ORDER — METHYLPREDNISOLONE SODIUM SUCC 125 MG IJ SOLR
60.0000 mg | Freq: Four times a day (QID) | INTRAMUSCULAR | Status: DC
  Administered 2016-11-20 (×2): 60 mg via INTRAVENOUS
  Filled 2016-11-20 (×2): qty 2

## 2016-11-20 MED ORDER — METHYLPREDNISOLONE SODIUM SUCC 40 MG IJ SOLR
40.0000 mg | Freq: Every day | INTRAMUSCULAR | Status: DC
  Administered 2016-11-21 – 2016-11-22 (×2): 40 mg via INTRAVENOUS
  Filled 2016-11-20 (×2): qty 1

## 2016-11-20 MED ORDER — ZOLPIDEM TARTRATE 5 MG PO TABS: 5.0000 mg | ORAL_TABLET | Freq: Every evening | ORAL | Status: DC | PRN

## 2016-11-20 MED ORDER — ONDANSETRON HCL 4 MG PO TABS
4.0000 mg | ORAL_TABLET | Freq: Four times a day (QID) | ORAL | Status: DC | PRN
  Filled 2016-11-20: qty 1

## 2016-11-20 MED ORDER — INSULIN ASPART 100 UNIT/ML ~~LOC~~ SOLN
0.0000 [IU] | Freq: Three times a day (TID) | SUBCUTANEOUS | Status: DC
  Administered 2016-11-20 (×3): 7 [IU] via SUBCUTANEOUS
  Administered 2016-11-21: 11 [IU] via SUBCUTANEOUS
  Administered 2016-11-21: 3 [IU] via SUBCUTANEOUS
  Administered 2016-11-21: 4 [IU] via SUBCUTANEOUS
  Administered 2016-11-22: 3 [IU] via SUBCUTANEOUS
  Administered 2016-11-22: 4 [IU] via SUBCUTANEOUS
  Filled 2016-11-20: qty 1
  Filled 2016-11-20: qty 3
  Filled 2016-11-20 (×5): qty 1

## 2016-11-20 MED ORDER — BUDESONIDE 0.5 MG/2ML IN SUSP
0.5000 mg | Freq: Two times a day (BID) | RESPIRATORY_TRACT | Status: DC
  Administered 2016-11-20 – 2016-11-21 (×3): 0.5 mg via RESPIRATORY_TRACT
  Filled 2016-11-20 (×4): qty 2

## 2016-11-20 MED ORDER — BISACODYL 5 MG PO TBEC
5.0000 mg | DELAYED_RELEASE_TABLET | Freq: Every day | ORAL | Status: DC | PRN
  Filled 2016-11-20: qty 1

## 2016-11-20 MED ORDER — DOXYCYCLINE HYCLATE 100 MG PO TABS
100.0000 mg | ORAL_TABLET | Freq: Two times a day (BID) | ORAL | Status: DC
  Administered 2016-11-21 – 2016-11-22 (×3): 100 mg via ORAL
  Filled 2016-11-20 (×4): qty 1

## 2016-11-20 NOTE — Progress Notes (Signed)
Spoke with pt.'s wife to confirm that she had taken wallet with her from the ED.  Mrs. Jonathon Graham confirmed that she had the pt.'s wallet at the house.

## 2016-11-20 NOTE — Progress Notes (Signed)
[employer] [facility] [place] [facility]   PATIENT NAME: Jonathon Graham    MR#:  [id number]  DATE OF BIRTH:  [birth year]  SUBJECTIVE:  CHIEF COMPLAINT:   Chief Complaint  Patient presents with  . Shoulder Pain     With Respiratory Distress and Lethargic and Started on BiPAP . Had Significant Improvement When I Saw Her Earlier in ICU.  REVIEW OF SYSTEMS:  CONSTITUTIONAL: No fever, fatigue or weakness.  EYES: No blurred or double vision.  EARS, NOSE, AND THROAT: No tinnitus or ear pain.  RESPIRATORY: No cough, Positive for shortness of breath, wheezing or hemoptysis.  CARDIOVASCULAR: No chest pain, orthopnea, edema.  GASTROINTESTINAL: No nausea, vomiting, diarrhea or abdominal pain.  GENITOURINARY: No dysuria, hematuria.  ENDOCRINE: No polyuria, nocturia,  HEMATOLOGY: No anemia, easy bruising or bleeding SKIN: No rash or lesion. MUSCULOSKELETAL: No joint pain or arthritis.   NEUROLOGIC: No tingling, numbness, weakness.  PSYCHIATRY: No anxiety or depression.   ROS  DRUG ALLERGIES:   Allergies  Allergen Reactions  . Penicillins Anaphylaxis    VITALS:  Blood pressure 111/67, pulse 65, temperature 98.2 F (36.8 C), temperature source Oral, resp. rate 17, height 5\' 8"  (1.727 m), weight (!) 156.8 kg (345 lb 10.9 oz), SpO2 90 %.  PHYSICAL EXAMINATION:  GENERAL:  61 y.o.-year-old patient lying in the bed with no acute distress.  EYES: Pupils equal, round, reactive to light and accommodation. No scleral icterus. Extraocular muscles intact.  HEENT: Head atraumatic, normocephalic. Oropharynx and nasopharynx clear.  NECK:  Supple, no jugular venous distention. No thyroid enlargement, no tenderness.  LUNGS: Normal breath sounds bilaterally, some wheezing, no crepitation. No use of accessory muscles of respiration. bipap in use. CARDIOVASCULAR: S1, S2 normal. No murmurs, rubs, or gallops.  ABDOMEN: Soft, nontender, nondistended. Bowel sounds present. No organomegaly or  mass.  EXTREMITIES: No pedal edema, cyanosis, or clubbing.  NEUROLOGIC: Cranial nerves II through XII are intact. Muscle strength 5/5 in all extremities. Sensation intact. Gait not checked.  PSYCHIATRIC: The patient is alert and oriented x 3.  SKIN: No obvious rash, lesion, or ulcer.   Physical Exam LABORATORY PANEL:   CBC  Recent Labs Lab 11/20/16 0432  WBC 10.2  HGB 15.2  HCT 47.4  PLT 173   ------------------------------------------------------------------------------------------------------------------  Chemistries   Recent Labs Lab 11/19/16 2119 11/20/16 0432  NA 137 138  K 4.1 5.0  CL 99* 100*  CO2 33* 34*  GLUCOSE 138* 183*  BUN 17 17  CREATININE 0.79 0.84  CALCIUM 8.8* 8.7*  AST 20  --   ALT 17  --   ALKPHOS 46  --   BILITOT 0.8  --    ------------------------------------------------------------------------------------------------------------------  Cardiac Enzymes  Recent Labs Lab 11/20/16 0432 11/20/16 0937  TROPONINI 0.03* 0.04*   ------------------------------------------------------------------------------------------------------------------  RADIOLOGY:  Dg Chest 2 View  Result Date: 11/19/2016 CLINICAL DATA:  Right shoulder and arm pain EXAM: CHEST  2 VIEW COMPARISON:  04/07/2015 FINDINGS: Linear atelectasis at the left lung base. No focal consolidation or pleural effusion. Mild cardiomegaly without overt failure. No pneumothorax. Left AC joint degenerative change IMPRESSION: 1. Mild atelectasis at the left base 2. Cardiomegaly without edema Electronically Signed   By: Jasmine PangKim  Fujinaga M.D.   On: 11/19/2016 22:04   Ct Angio Chest Pe W And/or Wo Contrast  Result Date: 11/19/2016 CLINICAL DATA:  Patient reports right shoulder pain that radiates down into right arm and now having hypoxia and right chest pain. [facility]  [facility] and diagnosed with "spurs" and prescribed tramadol. Patient reports pain medication is not helping.  EXAM: CT ANGIOGRAPHY CHEST WITH CONTRAST TECHNIQUE: Multidetector CT imaging of the chest was performed using the standard protocol during bolus administration of intravenous contrast. Multiplanar CT image reconstructions and MIPs were obtained to evaluate the vascular anatomy. Study repeated due to initial poor pulmonary artery opacification. CONTRAST:  100 mL of Isovue 370 intravenous contrast COMPARISON:  Current chest radiograph FINDINGS: Cardiovascular: There is satisfactory opacification the pulmonary arteries to the segmental level. Study is somewhat degraded due to respiratory motion mostly affecting the lung bases. There is no evidence of a pulmonary embolism. Heart is mildly enlarged. There are no significant coronary artery calcifications. The thoracic aorta is normal caliber. No dissection. Minor atherosclerotic calcifications noted along the distal arch. Mediastinum/Nodes: No enlarged mediastinal, hilar, or axillary lymph nodes. Thyroid gland, trachea, and esophagus demonstrate no significant findings. Lungs/Pleura: Mild subsegmental atelectasis. Subtle mosaic attenuation most likely from small airways disease. No evidence of pneumonia or pulmonary edema. No mass or suspicious nodule. No pleural effusion or pneumothorax. Upper Abdomen: No acute abnormality. Musculoskeletal: No fracture or acute finding. No osteoblastic or osteolytic lesions. Review of the MIP images confirms the above findings. IMPRESSION: 1. No evidence of a pulmonary embolism. 2. No convincing pneumonia.  No pulmonary edema. 3. Subsegmental atelectasis. Subtle mosaic attenuation consistent with small airways disease and air trapping. 4. Mild cardiomegaly. Aortic aneurysm NOS (ICD10-I71.9). Electronically Signed   By: Amie Portland M.D.   On: 11/19/2016 23:17   US Venous Img Upper Uni Right  Result Date: 11/19/2016 CLINICAL DATA:  Right shoulder and arm pain for 3 days. EXAM: RIGHT UPPER EXTREMITY VENOUS DOPPLER ULTRASOUND  TECHNIQUE: Gray-scale sonography with graded compression, as well as color Doppler and duplex ultrasound were performed to evaluate the upper extremity deep venous system from the level of the subclavian vein and including the jugular, axillary, basilic, radial, ulnar and upper cephalic vein. Spectral Doppler was utilized to evaluate flow at rest and with distal augmentation maneuvers. COMPARISON:  None. FINDINGS: Contralateral Subclavian Vein: Respiratory phasicity is normal and symmetric with the symptomatic side. No evidence of thrombus. Normal compressibility. Internal Jugular Vein: No evidence of thrombus. Normal compressibility, respiratory phasicity and response to augmentation. Subclavian Vein: No evidence of thrombus. Normal compressibility, respiratory phasicity and response to augmentation. Axillary Vein: No evidence of thrombus. Normal compressibility, respiratory phasicity and response to augmentation. Cephalic Vein: No evidence of thrombus. Normal compressibility, respiratory phasicity and response to augmentation. Basilic Vein: No evidence of thrombus. Normal compressibility, respiratory phasicity and response to augmentation. Brachial Veins: No evidence of thrombus. Normal compressibility, respiratory phasicity and response to augmentation. Radial Veins: No evidence of thrombus. Normal compressibility, respiratory phasicity and response to augmentation. Ulnar Veins: No evidence of thrombus. Normal compressibility, respiratory phasicity and response to augmentation. Venous Reflux:  None visualized. Other Findings:  None visualized. IMPRESSION: No evidence of DVT within the right upper extremity. Electronically Signed   By: Amie Portland M.D.   On: 11/19/2016 22:33    ASSESSMENT AND PLAN:   Active Problems:   Acute respiratory failure with hypoxia [facility])  This is a 61 y.o. male with a history of COPD, depression, diabetes, hypertension, hyperlipidemia, now being admitted with:  #. Acute  exacerbation of COPD - IV steroids and azithromycin - Nebulizers, O2 therapy and expectorants as needed.  - Continuous pulse oximetry - CPAP as per home regimen - Required bipap and stepdown admission, further plan per pulm.  #.  Chest pain rule out ACS, atypical - Monitor on tele -Continue aspirin and statin - Trend troponins- stable., check lipids and TSH. - Morphine, nitro, beta blocker added - Check echo  #. Right shoulder and arm pain, likely musculoskeletal -Continue indomethacin, Tylenol, Voltaren gel  #. History of hypertension -Continue Lasix, HCTZ  #. History of hyperlipidemia - Continue atorvastatin  #. History of DM - Accuchecks achs with RISS coverage - Heart healthy, carb controlled diet  #. History of BPH - Continue Flomax   All the records are reviewed and case discussed with Care Management/Social Workerr. Management plans discussed with the patient, family and they are in agreement.  CODE STATUS: Full.  TOTAL TIME TAKING CARE OF THIS PATIENT: 35 minutes.    POSSIBLE D/C IN 1-2 DAYS, DEPENDING ON CLINICAL CONDITION.   Altamese Dilling M.D on 11/20/2016   Between 7am to 6pm - Pager - [phone]  After 6pm go to [email or web address] - password [email or web address]  [facility] [place] [facility]  Office  [phone]  CC: Primary care physician; Lorre Munroe, NP  Note: This dictation was prepared with Dragon dictation along with smaller phrase technology. Any transcriptional errors that result from this process are unintentional.

## 2016-11-20 NOTE — Progress Notes (Signed)
PT Cancellation Note  Patient Details Name: Jonathon Graham MRN: [id number] DOB: 1955/12/17   Cancelled Treatment:    Reason Eval/Treat Not Completed: Medical issues which prohibited therapy (Consult received and chart reviewed.  Per discussion with primary RN, patient newly placed on BiPAP for respiratory distress.  Recommends hold on patient at this time with re-attempt at later time/date as medically appropriate.)   Jennika Ringgold H. Manson PasseyBrown, PT, DPT, NCS 11/20/16, 10:34 AM [phone]

## 2016-11-20 NOTE — Progress Notes (Signed)
*  PRELIMINARY RESULTS* Echocardiogram 2D Echocardiogram has been performed.  [name], [name] 11/20/2016, 4:15 PM

## 2016-11-20 NOTE — Progress Notes (Signed)
CRITICAL VALUE ALERT  Critical Value: Troponin of 0.03  Date & Time Notied: 11/20/16@0529   Provider Notified: Ms. Luci Bankukov, NP @ [phone]  Orders Received/Actions taken: Will continue to monitor patient closely

## 2016-11-20 NOTE — Consult Note (Signed)
PULMONARY / CRITICAL CARE MEDICINE   Name: Jonathon Graham MRN: [id number] DOB: [birth year]    ADMISSION DATE:  11/19/2016   CONSULTATION DATE: 11/20/2016  REFERRING MD:  Dr. Felton Clinton  Reason: ICU management of acute respiratory failure  CHIEF COMPLAINT:  Worsening dyspnea, chest pain and shoulder pain  HISTORY OF PRESENT ILLNESS:   This is a 61 year old African-American male with a past medical history as indicated below. We'll presented to the ED with right shoulder and arm pain. Patient was seen earlier at [facility] and was given pain medications, but he indicated that his pain progressively got worse. Hence, he decided to come to the emergency room. At the ED, patient was found to be hypoxic with SPO2 of 89% on room air. He was also tachypneic with moderate to severe respiratory distress. CT chest was negative for PE and pneumonia suggestive of COPD. He is being admitted to the ICU for management of acute on chronic hypoxemic respiratory failure. He remains on BiPAP and reports mild improvement in symptoms. He uses CPAP at home and reports compliance  PAST MEDICAL HISTORY :  He  has a past medical history of Arthritis; Benign prostatic hyperplasia (BPH) with urinary urgency; COPD (chronic obstructive pulmonary disease) (HCC); Depression; Diabetes mellitus without complication (HCC); Essential hypertension; Fibromyalgia, primary; Hyperlipidemia; Hypertension; and Obesity.  PAST SURGICAL HISTORY: He  has no past surgical history on file.  Allergies  Allergen Reactions  . Penicillins Anaphylaxis    No current facility-administered medications on file prior to encounter.    Current Outpatient Prescriptions on File Prior to Encounter  Medication Sig  . acetaminophen (TYLENOL) 500 MG tablet Take 1 tablet (500 mg total) by mouth every 8 (eight) hours as needed.  Marland Kitchen albuterol (PROVENTIL) (2.5 MG/3ML) 0.083% nebulizer solution Take 3 mLs (2.5 mg total) by nebulization every 6 (six) hours as  needed for wheezing or shortness of breath.  Marland Kitchen aspirin 81 MG tablet Take 81 mg by mouth daily. Takes 4 81mg  aspirin daily.  Marland Kitchen atorvastatin (LIPITOR) 20 MG tablet Take 1 tablet (20 mg total) by mouth daily.  . diclofenac sodium (VOLTAREN) 1 % GEL Apply 2 g topically 4 (four) times daily.  Clinical research associate Bandages & Supports (MEDICAL COMPRESSION SOCKS) MISC 1 each by Does not apply route daily.  . furosemide (LASIX) 40 MG tablet Take 1 tablet (40 mg total) by mouth daily.  Marland Kitchen gabapentin (NEURONTIN) 100 MG capsule Take 3 capsules, three times a day.  Follow written instructions to taper up to this dose.  . hydrochlorothiazide (MICROZIDE) 12.5 MG capsule Take 1 capsule (12.5 mg total) by mouth daily.  . indomethacin (INDOCIN) 50 MG capsule Take 1 capsule (50 mg total) by mouth 3 (three) times daily as needed.  . metFORMIN (GLUCOPHAGE) 1000 MG tablet Take 1 tablet (1,000 mg total) by mouth 2 (two) times daily with a meal.  . phentermine 37.5 MG capsule Take 1 capsule (37.5 mg total) by mouth every morning.  . tamsulosin (FLOMAX) 0.4 MG CAPS capsule Take 0.4 mg by mouth.    FAMILY HISTORY:  His indicated that the status of his father is unknown. He indicated that the status of his brother is unknown.    SOCIAL HISTORY: He  reports that he has been smoking Cigarettes.  He has never used smokeless tobacco. He reports that he does not drink alcohol or use drugs.  REVIEW OF SYSTEMS:   Unable to obtain as patient is on BiPAP and some   SUBJECTIVE:  VITAL SIGNS: BP 116/71   Pulse 86   Temp 97.9 F (36.6 C) (Oral)   Resp (!) 25   Ht 5\' 8"  (1.727 m)   Wt (!) 345 lb 10.9 oz (156.8 kg)   SpO2 90%   BMI 52.56 kg/m   HEMODYNAMICS:    VENTILATOR SETTINGS: FiO2 (%):  [70 %-80 %] 70 %  INTAKE / OUTPUT: No intake/output data recorded.  PHYSICAL EXAMINATION: General:  Moderate respiratory distress Neuro:  Somnolent, opens eyes to voice, touch and noxious stimulus, follows some basic commands,  moves all extremities HEENT:  PERRLA, oral mucosa moist and pink Cardiovascular: Apical pulse regular, S1, S2 audible, no murmur, regurg or gallop Lungs:  Increased work of breathing, bilateral breath sounds, significantly diminished in the bases bilaterally, diffuse rhonchi and bilateral Rales in bilateral lower lobes Abdomen:  Obese, normal bowel sounds, palpation reveals no organomegaly Musculoskeletal:  Positive range of motion in upper and lower extremities, no deformities noted Skin:  Warm and dry, mild venostasis discoloration and lower extremities  LABS:  BMET  Recent Labs Lab 11/19/16 2119 11/20/16 0432  NA 137 138  K 4.1 5.0  CL 99* 100*  CO2 33* 34*  BUN 17 17  CREATININE 0.79 0.84  GLUCOSE 138* 183*    Electrolytes  Recent Labs Lab 11/19/16 2119 11/20/16 0432  CALCIUM 8.8* 8.7*    CBC  Recent Labs Lab 11/19/16 2119 11/20/16 0432  WBC 9.9 10.2  HGB 14.8 15.2  HCT 44.9 47.4  PLT 190 173    Coag's No results for input(s): APTT, INR in the last 168 hours.  Sepsis Markers No results for input(s): LATICACIDVEN, PROCALCITON, O2SATVEN in the last 168 hours.  ABG  Recent Labs Lab 11/20/16 0107 11/20/16 0240  PHART 7.18* 7.18*  PCO2ART 101* 97*  PO2ART 96 82*    Liver Enzymes  Recent Labs Lab 11/19/16 2119  AST 20  ALT 17  ALKPHOS 46  BILITOT 0.8  ALBUMIN 4.0    Cardiac Enzymes  Recent Labs Lab 11/19/16 2119 11/20/16 0432  TROPONINI <0.03 0.03*    Glucose  Recent Labs Lab 11/20/16 0334  GLUCAP 181*    Imaging Dg Chest 2 View  Result Date: 11/19/2016 CLINICAL DATA:  Right shoulder and arm pain EXAM: CHEST  2 VIEW COMPARISON:  04/07/2015 FINDINGS: Linear atelectasis at the left lung base. No focal consolidation or pleural effusion. Mild cardiomegaly without overt failure. No pneumothorax. Left AC joint degenerative change IMPRESSION: 1. Mild atelectasis at the left base 2. Cardiomegaly without edema Electronically Signed    By: Jasmine PangKim  Fujinaga M.D.   On: 11/19/2016 22:04   Ct Angio Chest Pe W And/or Wo Contrast  Result Date: 11/19/2016 CLINICAL DATA:  Patient reports right shoulder pain that radiates down into right arm and now having hypoxia and right chest pain. [facility] and diagnosed with "spurs" and prescribed tramadol. Patient reports pain medication is not helping. EXAM: CT ANGIOGRAPHY CHEST WITH CONTRAST TECHNIQUE: Multidetector CT imaging of the chest was performed using the standard protocol during bolus administration of intravenous contrast. Multiplanar CT image reconstructions and MIPs were obtained to evaluate the vascular anatomy. Study repeated due to initial poor pulmonary artery opacification. CONTRAST:  100 mL of Isovue 370 intravenous contrast COMPARISON:  Current chest radiograph FINDINGS: Cardiovascular: There is satisfactory opacification the pulmonary arteries to the segmental level. Study is somewhat degraded due to respiratory motion mostly affecting the lung bases. There is no evidence of  a pulmonary embolism. Heart is mildly enlarged. There are no significant coronary artery calcifications. The thoracic aorta is normal caliber. No dissection. Minor atherosclerotic calcifications noted along the distal arch. Mediastinum/Nodes: No enlarged mediastinal, hilar, or axillary lymph nodes. Thyroid gland, trachea, and esophagus demonstrate no significant findings. Lungs/Pleura: Mild subsegmental atelectasis. Subtle mosaic attenuation most likely from small airways disease. No evidence of pneumonia or pulmonary edema. No mass or suspicious nodule. No pleural effusion or pneumothorax. Upper Abdomen: No acute abnormality. Musculoskeletal: No fracture or acute finding. No osteoblastic or osteolytic lesions. Review of the MIP images confirms the above findings. IMPRESSION: 1. No evidence of a pulmonary embolism. 2. No convincing pneumonia.  No pulmonary edema. 3. Subsegmental  atelectasis. Subtle mosaic attenuation consistent with small airways disease and air trapping. 4. Mild cardiomegaly. Aortic aneurysm NOS (ICD10-I71.9). Electronically Signed   By: Amie Portland M.D.   On: 11/19/2016 23:17   US Venous Img Upper Uni Right  Result Date: 11/19/2016 CLINICAL DATA:  Right shoulder and arm pain for 3 days. EXAM: RIGHT UPPER EXTREMITY VENOUS DOPPLER ULTRASOUND TECHNIQUE: Gray-scale sonography with graded compression, as well as color Doppler and duplex ultrasound were performed to evaluate the upper extremity deep venous system from the level of the subclavian vein and including the jugular, axillary, basilic, radial, ulnar and upper cephalic vein. Spectral Doppler was utilized to evaluate flow at rest and with distal augmentation maneuvers. COMPARISON:  None. FINDINGS: Contralateral Subclavian Vein: Respiratory phasicity is normal and symmetric with the symptomatic side. No evidence of thrombus. Normal compressibility. Internal Jugular Vein: No evidence of thrombus. Normal compressibility, respiratory phasicity and response to augmentation. Subclavian Vein: No evidence of thrombus. Normal compressibility, respiratory phasicity and response to augmentation. Axillary Vein: No evidence of thrombus. Normal compressibility, respiratory phasicity and response to augmentation. Cephalic Vein: No evidence of thrombus. Normal compressibility, respiratory phasicity and response to augmentation. Basilic Vein: No evidence of thrombus. Normal compressibility, respiratory phasicity and response to augmentation. Brachial Veins: No evidence of thrombus. Normal compressibility, respiratory phasicity and response to augmentation. Radial Veins: No evidence of thrombus. Normal compressibility, respiratory phasicity and response to augmentation. Ulnar Veins: No evidence of thrombus. Normal compressibility, respiratory phasicity and response to augmentation. Venous Reflux:  None visualized. Other Findings:   None visualized. IMPRESSION: No evidence of DVT within the right upper extremity. Electronically Signed   By: Amie Portland M.D.   On: 11/19/2016 22:33     STUDIES:  2-D echo pending  CULTURES: None  ANTIBIOTICS: Doxycycline  SIGNIFICANT EVENTS: 11/19/2016: ED with acute on chronic respiratory failure  LINES/TUBES: Peripheral IVs  DISCUSSION: 61 Y/O Male presenting with acute hypoxic respiratory failure  ASSESSMENT / PLAN: Acute hypoxemic respiratory failure Acute COPD exacerbation Chest pain-atypical versus ACS, EKG shows no ST changes, but prolonged QTC and inverted T waves Right shoulder/arm pain H/o T2DM H/O Hypertension H/O Hyperlipidemia  PLAN BiPAP and titrate to O2 [place] as tolerated Cycle cardiac enzymes Doxycycline for COPD exacerbation ABG and chest x-ray when necessary Continue all home medications Nebulized bronchodilator Systemic steroids. Morphine when necessary Nitroglycerin when necessary for chest pain Blood glucose monitoring with sliding scale insulin coverage  FAMILY  - Updates: No family at bedside. Will update when availble  - Inter-disciplinary family meet or Palliative Care meeting due by:  day 7   [facility] ANP-BC Pulmonary and Critical Care Medicine [facility] Pager [phone] or [phone]  11/20/2016, 5:48 AM

## 2016-11-20 NOTE — Progress Notes (Signed)
CONCERNING: Antibiotic IV to Oral Route Change Policy  RECOMMENDATION: This patient is receiving doxycycline by the intravenous route.  Based on criteria approved by the Pharmacy and Therapeutics Committee, the antibiotic(s) is/are being converted to the equivalent oral dose form(s).   DESCRIPTION: These criteria include:  Patient being treated for a respiratory tract infection, urinary tract infection, cellulitis or clostridium difficile associated diarrhea if on metronidazole  The patient is not neutropenic and does not exhibit a GI malabsorption state  The patient is eating (either orally or via tube) and/or has been taking other orally administered medications for a least 24 hours  The patient is improving clinically and has a Tmax < 100.5  If you have questions about this conversion, please contact the Pharmacy Department  []   [phone]( [phone] )  [facility] [x]   [phone]( [phone] )  [facility] []   [phone]( [phone] )  [facility] []   [phone]( [phone]  [facility] []   [phone]( [phone] )  [facility]

## 2016-11-20 NOTE — Progress Notes (Signed)
Patient resting in bed, using Bi Pap. FiO2 @ 60% to maintain sats>86%.  Will continue to monitor.

## 2016-11-20 NOTE — Progress Notes (Signed)
Pharmacy Antibiotic Note  Jonathon Graham is a 61 y.o. male admitted on 11/19/2016 with pneumonia.  Pharmacy has been consulted for Doxycycline dosing.  Assessment: Patient admitted for should/chest pain -- trops NG; ECG 6/24 shows RBBB w/ QTc 518. CXR stable cardiomegaly w/o edema w/ mild atelectasis at LL base. CT PE NG  Plan: Order originally placed for Azithromycin 500 mg IV daily; but considering prolongated QTc called MD and recommended to switch to doxycycline for CAP. MD agrees w/ plan. Will start doxycycline 100 mg IV q12h  x 5 - 7 days depending on clinical status.   Height: 5\' 8"  (172.7 cm) Weight: (!) 345 lb 10.9 oz (156.8 kg) IBW/kg (Calculated) : 68.4  Temp (24hrs), Avg:98.1 F (36.7 C), Min:97.9 F (36.6 C), Max:98.3 F (36.8 C)   Recent Labs Lab 11/19/16 2119  WBC 9.9  CREATININE 0.79    Estimated Creatinine Clearance: 147.8 mL/min (by C-G formula based on SCr of 0.79 mg/dL).    Allergies  Allergen Reactions  . Penicillins Anaphylaxis    Thank you for allowing pharmacy to be a part of this patient's care.  Thomasene Rippleavid Hermila Millis, PharmD, BCPS Clinical Pharmacist 11/20/2016

## 2016-11-20 NOTE — Progress Notes (Signed)
Patient has been ordered lovenox 40 mg subq daily for VTE prophylaxis. Patient's wt. 149.7 kg Ht. 5\' 8"   BMI > 40 CrCl 143.6 ml/min   Considering pt's BMI > 40 and adequate renal function will switch to lovenox 40 mg twice daily for VTE prophylaxis  Thomasene Rippleavid Alice Burnside, PharmD, BCPS Clinical Pharmacist 11/20/2016

## 2016-11-20 NOTE — Care Management Note (Addendum)
Case Management Note  Patient Details  Name: Jonathon Graham MRN: [id number] Date of Birth: 1955-12-19  Subjective/Objective:                   Met with patient and his wife to discuss discharge planning. Patient is requiring BiPAP currently. They both request assistance with scheduling follow up with sleep study. If I understood them both correctly, patient's current BiPAP may not have the correct settings as it was given to him by a friend. He is not on O2 at home. He states that he is usually independent with daily activities. He denies having problems with transportation or obtaining medications.  Action/Plan: RNCM has called patient's PCP DR. Baity Barbera Setters) to request expedition of outpatient sleep study arrangement.   Expected Discharge Date:                  Expected Discharge Plan:     In-House Referral:  PCP / [facility]  Discharge planning Services  CM Consult  Post Acute Care Choice:    Choice offered to:  Patient, Spouse  DME Arranged:    DME Agency:     HH Arranged:    [place] Agency:     Status of Service:  In process, will continue to follow  If discussed at Long Length of Stay Meetings, dates discussed:    Additional Comments:  Marshell Garfinkel, RN 11/20/2016, 12:14 PM

## 2016-11-20 NOTE — Care Management (Signed)
RNCM received [facility] stating that she would make referral to Pulmonary for 'bipap'. I called Bonita QuinLinda back to confirm appointment was needed for outpatient sleep study arrangement and CPAP (no BiPAP).

## 2016-11-20 NOTE — ED Notes (Signed)
Domenick GongSheila Stanfill [phone]

## 2016-11-21 ENCOUNTER — Inpatient Hospital Stay: Payer: BC Managed Care – PPO

## 2016-11-21 LAB — BASIC METABOLIC PANEL
Anion gap: 6 (ref 5–15)
BUN: 23 mg/dL — ABNORMAL HIGH (ref 6–20)
CO2: 35 mmol/L — ABNORMAL HIGH (ref 22–32)
Calcium: 9 mg/dL (ref 8.9–10.3)
Chloride: 97 mmol/L — ABNORMAL LOW (ref 101–111)
Creatinine, Ser: 0.69 mg/dL (ref 0.61–1.24)
GFR calc Af Amer: >60 mL/min (ref >60)
GFR calc non Af Amer: >60 mL/min (ref >60)
Glucose, Bld: 178 mg/dL — ABNORMAL HIGH (ref 65–99)
Potassium: 4.7 mmol/L (ref 3.5–5.1)
Sodium: 138 mmol/L (ref 135–145)

## 2016-11-21 LAB — GLUCOSE, CAPILLARY
Glucose-Capillary: 143 mg/dL — ABNORMAL HIGH (ref 65–99)
Glucose-Capillary: 181 mg/dL — ABNORMAL HIGH (ref 65–99)
Glucose-Capillary: 266 mg/dL — ABNORMAL HIGH (ref 65–99)
Glucose-Capillary: 206 mg/dL — ABNORMAL HIGH (ref 65–99)

## 2016-11-21 LAB — HIV ANTIBODY (ROUTINE TESTING W REFLEX): HIV Screen 4th Generation wRfx: NONREACTIVE

## 2016-11-21 LAB — PROCALCITONIN: Procalcitonin: <0.1 ng/mL

## 2016-11-21 MED ORDER — MOMETASONE FURO-FORMOTEROL FUM 100-5 MCG/ACT IN AERO
2.0000 | INHALATION_SPRAY | Freq: Two times a day (BID) | RESPIRATORY_TRACT | Status: DC
  Administered 2016-11-21 – 2016-11-22 (×3): 2 via RESPIRATORY_TRACT
  Filled 2016-11-21: qty 8.8

## 2016-11-21 MED ORDER — OXYCODONE HCL 5 MG PO TABS: 5.0000 mg | ORAL_TABLET | Freq: Four times a day (QID) | ORAL | Status: DC | PRN

## 2016-11-21 MED ORDER — ACETAZOLAMIDE SODIUM 500 MG IJ SOLR
250.0000 mg | Freq: Once | INTRAMUSCULAR | Status: AC
  Administered 2016-11-21: 250 mg via INTRAVENOUS
  Filled 2016-11-21: qty 500

## 2016-11-21 MED ORDER — STERILE WATER FOR INJECTION IJ SOLN
INTRAMUSCULAR | Status: AC
  Administered 2016-11-21: 5 mL
  Filled 2016-11-21: qty 10

## 2016-11-21 MED ORDER — TRAMADOL HCL 50 MG PO TABS
50.0000 mg | ORAL_TABLET | Freq: Four times a day (QID) | ORAL | Status: DC | PRN
  Administered 2016-11-21: 50 mg via ORAL
  Filled 2016-11-21: qty 1

## 2016-11-21 NOTE — Progress Notes (Signed)
[employer] - [place] at [facility]   PATIENT NAME: Jonathon Graham    MR#:  [id number]  DATE OF BIRTH:  17-Oct-1955  SUBJECTIVE:  CHIEF COMPLAINT:   Chief Complaint  Patient presents with  . Shoulder Pain     With Respiratory Distress and Lethargic and Started on BiPAP . Had Significant Improvement , now off Bipap on nasal canula oxygen.  REVIEW OF SYSTEMS:  CONSTITUTIONAL: No fever, fatigue or weakness.  EYES: No blurred or double vision.  EARS, NOSE, AND THROAT: No tinnitus or ear pain.  RESPIRATORY: No cough, Positive for shortness of breath, wheezing or hemoptysis.  CARDIOVASCULAR: No chest pain, orthopnea, edema.  GASTROINTESTINAL: No nausea, vomiting, diarrhea or abdominal pain.  GENITOURINARY: No dysuria, hematuria.  ENDOCRINE: No polyuria, nocturia,  HEMATOLOGY: No anemia, easy bruising or bleeding SKIN: No rash or lesion. MUSCULOSKELETAL: No joint pain or arthritis.   NEUROLOGIC: No tingling, numbness, weakness.  PSYCHIATRY: No anxiety or depression.   ROS  DRUG ALLERGIES:   Allergies  Allergen Reactions  . Penicillins Anaphylaxis    VITALS:  Blood pressure 131/79, pulse 68, temperature 98.8 F (37.1 C), temperature source Oral, resp. rate 20, height 5\' 8"  (1.727 m), weight (!) 156.8 kg (345 lb 10.9 oz), SpO2 90 %.  PHYSICAL EXAMINATION:  GENERAL:  61 y.o.-year-old patient lying in the bed with no acute distress.  EYES: Pupils equal, round, reactive to light and accommodation. No scleral icterus. Extraocular muscles intact.  HEENT: Head atraumatic, normocephalic. Oropharynx and nasopharynx clear.  NECK:  Supple, no jugular venous distention. No thyroid enlargement, no tenderness.  LUNGS: Normal breath sounds bilaterally, some wheezing, no crepitation. No use of accessory muscles of respiration.  CARDIOVASCULAR: S1, S2 normal. No murmurs, rubs, or gallops.  ABDOMEN: Soft, nontender, nondistended. Bowel sounds present. No organomegaly or mass.   EXTREMITIES: No pedal edema, cyanosis, or clubbing.  NEUROLOGIC: Cranial nerves II through XII are intact. Muscle strength 5/5 in all extremities. Sensation intact. Gait not checked.  PSYCHIATRIC: The patient is alert and oriented x 3.  SKIN: No obvious rash, lesion, or ulcer.   Physical Exam LABORATORY PANEL:   CBC  Recent Labs Lab 11/20/16 0432  WBC 10.2  HGB 15.2  HCT 47.4  PLT 173   ------------------------------------------------------------------------------------------------------------------  Chemistries   Recent Labs Lab 11/19/16 2119  11/21/16 1013  NA 137  < > 138  K 4.1  < > 4.7  CL 99*  < > 97*  CO2 33*  < > 35*  GLUCOSE 138*  < > 178*  BUN 17  < > 23*  CREATININE 0.79  < > 0.69  CALCIUM 8.8*  < > 9.0  AST 20  --   --   ALT 17  --   --   ALKPHOS 46  --   --   BILITOT 0.8  --   --   < > = values in this interval not displayed. ------------------------------------------------------------------------------------------------------------------  Cardiac Enzymes  Recent Labs Lab 11/20/16 0937 11/20/16 1539  TROPONINI 0.04* 0.04*   ------------------------------------------------------------------------------------------------------------------  RADIOLOGY:  Dg Chest 2 View  Result Date: 11/19/2016 CLINICAL DATA:  Right shoulder and arm pain EXAM: CHEST  2 VIEW COMPARISON:  04/07/2015 FINDINGS: Linear atelectasis at the left lung base. No focal consolidation or pleural effusion. Mild cardiomegaly without overt failure. No pneumothorax. Left AC joint degenerative change IMPRESSION: 1. Mild atelectasis at the left base 2. Cardiomegaly without edema Electronically Signed   By: Jasmine Pang M.D.   On:  11/19/2016 22:04   Dg Shoulder Right  Result Date: 11/21/2016 CLINICAL DATA:  Shoulder pain and limited range of motion EXAM: RIGHT SHOULDER - 2+ VIEW COMPARISON:  None. FINDINGS: There is no evidence of fracture or dislocation. Mild irregularity in the  superior aspect of the humeral head is noted which may represent a Hill-Sachs deformity from prior dislocation. Correlation with clinical history is recommended. No soft tissue abnormality is seen. The underlying bony thorax is within normal limits. IMPRESSION: Chronic appearing changes.  No acute abnormality noted. Electronically Signed   By: Alcide Clever M.D.   On: 11/21/2016 14:26   Ct Angio Chest Pe W And/or Wo Contrast  Result Date: 11/19/2016 CLINICAL DATA:  Patient reports right shoulder pain that radiates down into right arm and now having hypoxia and right chest pain. Reports seen at [facility] earlier today for same and diagnosed with "spurs" and prescribed tramadol. Patient reports pain medication is not helping. EXAM: CT ANGIOGRAPHY CHEST WITH CONTRAST TECHNIQUE: Multidetector CT imaging of the chest was performed using the standard protocol during bolus administration of intravenous contrast. Multiplanar CT image reconstructions and MIPs were obtained to evaluate the vascular anatomy. Study repeated due to initial poor pulmonary artery opacification. CONTRAST:  100 mL of Isovue 370 intravenous contrast COMPARISON:  Current chest radiograph FINDINGS: Cardiovascular: There is satisfactory opacification the pulmonary arteries to the segmental level. Study is somewhat degraded due to respiratory motion mostly affecting the lung bases. There is no evidence of a pulmonary embolism. Heart is mildly enlarged. There are no significant coronary artery calcifications. The thoracic aorta is normal caliber. No dissection. Minor atherosclerotic calcifications noted along the distal arch. Mediastinum/Nodes: No enlarged mediastinal, hilar, or axillary lymph nodes. Thyroid gland, trachea, and esophagus demonstrate no significant findings. Lungs/Pleura: Mild subsegmental atelectasis. Subtle mosaic attenuation most likely from small airways disease. No evidence of pneumonia or pulmonary edema. No mass or suspicious nodule.  No pleural effusion or pneumothorax. Upper Abdomen: No acute abnormality. Musculoskeletal: No fracture or acute finding. No osteoblastic or osteolytic lesions. Review of the MIP images confirms the above findings. IMPRESSION: 1. No evidence of a pulmonary embolism. 2. No convincing pneumonia.  No pulmonary edema. 3. Subsegmental atelectasis. Subtle mosaic attenuation consistent with small airways disease and air trapping. 4. Mild cardiomegaly. Aortic aneurysm NOS (ICD10-I71.9). Electronically Signed   By: Amie Portland M.D.   On: 11/19/2016 23:17   US Venous Img Upper Uni Right  Result Date: 11/19/2016 CLINICAL DATA:  Right shoulder and arm pain for 3 days. EXAM: RIGHT UPPER EXTREMITY VENOUS DOPPLER ULTRASOUND TECHNIQUE: Gray-scale sonography with graded compression, as well as color Doppler and duplex ultrasound were performed to evaluate the upper extremity deep venous system from the level of the subclavian vein and including the jugular, axillary, basilic, radial, ulnar and upper cephalic vein. Spectral Doppler was utilized to evaluate flow at rest and with distal augmentation maneuvers. COMPARISON:  None. FINDINGS: Contralateral Subclavian Vein: Respiratory phasicity is normal and symmetric with the symptomatic side. No evidence of thrombus. Normal compressibility. Internal Jugular Vein: No evidence of thrombus. Normal compressibility, respiratory phasicity and response to augmentation. Subclavian Vein: No evidence of thrombus. Normal compressibility, respiratory phasicity and response to augmentation. Axillary Vein: No evidence of thrombus. Normal compressibility, respiratory phasicity and response to augmentation. Cephalic Vein: No evidence of thrombus. Normal compressibility, respiratory phasicity and response to augmentation. Basilic Vein: No evidence of thrombus. Normal compressibility, respiratory phasicity and response to augmentation. Brachial Veins: No evidence of thrombus. Normal compressibility,  respiratory  phasicity and response to augmentation. Radial Veins: No evidence of thrombus. Normal compressibility, respiratory phasicity and response to augmentation. Ulnar Veins: No evidence of thrombus. Normal compressibility, respiratory phasicity and response to augmentation. Venous Reflux:  None visualized. Other Findings:  None visualized. IMPRESSION: No evidence of DVT within the right upper extremity. Electronically Signed   By: Amie Portlandavid  Ormond M.D.   On: 11/19/2016 22:33    ASSESSMENT AND PLAN:   Active Problems:   Acute respiratory failure with hypoxia [facility](HCC)  This is a 61 y.o. male with a history of COPD, depression, diabetes, hypertension, hyperlipidemia, now being admitted with:  #. Acute exacerbation of COPD - IV steroids and azithromycin - Nebulizers, O2 therapy and expectorants as needed.  - Continuous pulse oximetry - CPAP as per home regimen - Required bipap and stepdown admission,  - Off biPAP now, on nasal canula oxygen.  #. Chest pain rule out ACS, atypical - Monitor on tele -Continue aspirin and statin - Trend troponins- stable., checked lipids- normal and TSH- normal. - Morphine, nitro, beta blocker added - Checked echo- EF 55%  #. Right shoulder and arm pain, likely musculoskeletal -Continue indomethacin, Tylenol, Voltaren gel  #. History of hypertension -Continue Lasix, HCTZ  #. History of hyperlipidemia - Continue atorvastatin  #. History of DM - Accuchecks achs with RISS coverage - Heart healthy, carb controlled diet  #. History of BPH - Continue Flomax   All the records are reviewed and case discussed with Care Management/Social Workerr. Management plans discussed with the patient, family and they are in agreement.  CODE STATUS: Full.  TOTAL TIME TAKING CARE OF THIS PATIENT: 35 minutes.    POSSIBLE D/C IN 1-2 DAYS, DEPENDING ON CLINICAL CONDITION.   Altamese DillingVACHHANI, Jawanza Zambito M.D on 11/21/2016   Between 7am to 6pm - Pager -  [phone]  After 6pm go to [email or web address] - password [email or web address]  [facility] [place] [facility]  Office  [phone]  CC: Primary care physician; Lorre MunroeBaity, Regina W, NP  Note: This dictation was prepared with Dragon dictation along with smaller phrase technology. Any transcriptional errors that result from this process are unintentional.

## 2016-11-21 NOTE — Progress Notes (Signed)
Patient has refused bipap for the night.  Stated too much air. Pressure was set on 25/5. Titrated back to 18/5. Patient continue to refuse. No distress noted at this time

## 2016-11-21 NOTE — Consult Note (Signed)
PULMONARY / CRITICAL CARE MEDICINE   Name: Jonathon Graham MRN: [id number] DOB: 02-17-1956    ADMISSION DATE:  11/19/2016   CONSULTATION DATE: 11/20/2016  REFERRING MD:  Dr. Felton Clinton  Reason: ICU management of acute respiratory failure  CHIEF COMPLAINT:  Worsening dyspnea, chest pain and shoulder pain   synopsis This is a 61 year old African-American male with a past medical history as indicated below. We'll presented to the ED with right shoulder and arm pain. Patient was seen earlier at [facility] and was given pain medications, but he indicated that his pain progressively got worse. Hence, he decided to come to the emergency room. At the ED, patient was found to be hypoxic with SPO2 of 89% on room air. He was also tachypneic with moderate to severe respiratory distress. CT chest was negative for PE and pneumonia suggestive of COPD. He is being admitted to the ICU for management of acute on chronic hypoxemic respiratory failure. He remains on BiPAP and reports mild improvement in symptoms. He uses CPAP at home and reports compliance    History of present illness Follow-up shortness of breath Patient feels a lot better since admission Patient with BiPAP at night and tolerated well Patient alert and awake following commands Will transition off BiPAP and on to nasal cannula as tolerated       Review of Systems:  Gen:  Denies  fever, sweats, chills weigh loss   HEENT: Denies blurred vision, double vision, ear pain, eye pain, hearing loss, nose bleeds, sore throat  Cardiac:  No dizziness, chest pain or heaviness, chest tightness,edema  Resp:   + shortness of breath  Other:  All other systems negative   SUBJECTIVE:    VITAL SIGNS: BP 134/84   Pulse (!) 57   Temp 98.6 F (37 C) (Oral)   Resp 17   Ht 5\' 8"  (1.727 m)   Wt (!) 345 lb 10.9 oz (156.8 kg)   SpO2 99%   BMI 52.56 kg/m   HEMODYNAMICS:    VENTILATOR SETTINGS: FiO2 (%):  [40 %-70 %] 40 %  INTAKE /  OUTPUT: I/O last 3 completed shifts: In: 1850 [P.O.:1350; IV Piggyback:500] Out: 1825 [Urine:1825]  PHYSICAL EXAMINATION: General:  Mild respiratory distress Neuro:  Somnolent, opens eyes to voice, touch and noxious stimulus, follows some basic commands, moves all extremities HEENT:  PERRLA, oral mucosa moist and pink Cardiovascular: Apical pulse regular, S1, S2 audible, no murmur, regurg or gallop Lungs:   bilateral breath sounds, significantly diminished in the bases bilaterally,  diffuse rhonchi and bilateral Rales in bilateral lower lobes Abdomen:  Obese, normal bowel sounds, palpation reveals no organomegaly Musculoskeletal:  Positive range of motion in upper and lower extremities, no deformities noted Skin:  Warm and dry, mild venostasis discoloration and lower extremities  LABS:  BMET  Recent Labs Lab 11/19/16 2119 11/20/16 0432  NA 137 138  K 4.1 5.0  CL 99* 100*  CO2 33* 34*  BUN 17 17  CREATININE 0.79 0.84  GLUCOSE 138* 183*    Electrolytes  Recent Labs Lab 11/19/16 2119 11/20/16 0432  CALCIUM 8.8* 8.7*    CBC  Recent Labs Lab 11/19/16 2119 11/20/16 0432  WBC 9.9 10.2  HGB 14.8 15.2  HCT 44.9 47.4  PLT 190 173    Coag's No results for input(s): APTT, INR in the last 168 hours.  Sepsis Markers  Recent Labs Lab 11/20/16 0937 11/21/16 0406  PROCALCITON <0.10 <0.10    ABG  Recent Labs Lab 11/20/16  0107 11/20/16 0240 11/20/16 0500  PHART 7.18* 7.18* 7.25*  PCO2ART 101* 97* 84*  PO2ART 96 82* 76*    Liver Enzymes  Recent Labs Lab 11/19/16 2119  AST 20  ALT 17  ALKPHOS 46  BILITOT 0.8  ALBUMIN 4.0    Cardiac Enzymes  Recent Labs Lab 11/20/16 0432 11/20/16 0937 11/20/16 1539  TROPONINI 0.03* 0.04* 0.04*    Glucose  Recent Labs Lab 11/20/16 0852 11/20/16 1111 11/20/16 1643 11/20/16 1937 11/20/16 2212 11/20/16 2341  GLUCAP 212* 209* 224* 239* 178* 170*    Imaging No results found.   STUDIES:  2-D  echo EF of 55-60% noted to be a difficult study    SIGNIFICANT EVENTS: 11/19/2016: ED with acute on chronic respiratory failure 11/21/2016: Weaning off BiPAP   LINES/TUBES: Peripheral IVs  DISCUSSION: 61 Y/O Male presenting with acute hypoxic respiratory failure from COPD exacerbation in the setting of morbid obesity and noncompliant OSA, I do believe he also has some signs and symptoms of diastolic heart failure  ASSESSMENT / PLAN: Acute hypoxemic respiratory failure Acute COPD exacerbation, diastolic heart failure Chest pain-atypical versus ACS, EKG shows no ST changes, but prolonged QTC and inverted T waves Right shoulder/arm pain H/o T2DM H/O Hypertension H/O Hyperlipidemia  PLAN BiPAP and titrate to O2 [place] as tolerated Cycle cardiac enzymes ABG and chest x-ray when necessary Continue all home medications Nebulized bronchodilator Systemic steroids. Morphine when necessary Nitroglycerin when necessary for chest pain Blood glucose monitoring with sliding scale insulin coverage consider Diamox therapy for diuresis  At this time his restrictive status has stabilized and is okay to transfer to general medical floor   Patient  satisfied with Plan of action and management. All questions answered  Lucie LeatherKurian David Denzil Bristol, M.D.  [facility]  Medical Director [facility] Medical Director [facility]

## 2016-11-21 NOTE — Evaluation (Signed)
Physical Therapy Evaluation Patient Details Name: Kenry Daubert MRN: [id number] DOB: 02-Jul-1955 Today's Date: 11/21/2016   History of Present Illness  61 y/o male admitted for acute respiratory failure on 11/19/16. Pt's chief complaint was R shoulder pain. [facility] ED identified bone spurs in his shoulder and dc him with tramadol. Pt's symptoms continued to worsen, so he came to [facility] ED. Pt's SAO2 was 89% upon arrival to ED. Pt required BiPAP before transitioning to using 40L of O2. Pt now using 1L of O2 and his SAO2 has remained in the 90's. PMH includes COPD (not on oxygen), depression, DM, HTN, obesity, HLD, benign prostatic hyperplasia, and fibromyalgia.    Clinical Impression  Pt is a pleasant 61 year old admitted for acute respiratory failure. Pt performs bed mobility and transfers with supervision. Pt ambulates with min guard and RW with chair follow. Ambulated 175ft this session on 4L of O2. Pt's SAO2 on 1L O2 during there-ex dropped to 77%, however he recovered within 10s to 89%. PT continued to increase his oxygen to 4L to ensure his SAO2 remained above 90% during exercise and mobilization. Pt performed 5x sit to stand in 24.9s with BUE assist on 3L of O2. Pt is strong and confident in his abilities. Pt demonstrates deficits in endurance and pain (R shoulder). Would benefit from skilled PT to address above deficits and promote optimal return to PLOF. Pt is motivated to participate in therapy. PT recommends dc home with home health PT to address his SAO2 and endurance deficits.     Follow Up Recommendations Home health PT    Equipment Recommendations       Recommendations for Other Services       Precautions / Restrictions Precautions Precautions: Fall Restrictions Weight Bearing Restrictions: No      Mobility  Bed Mobility Overal bed mobility: Needs Assistance Bed Mobility: Supine to Sit     Supine to sit: Supervision     General bed mobility comments: Supine to EOB with  supervision for safety. No dizziness noted once seated. Performed on 1L of O2. SAO2 remained above 87% and would recover to 90% within 10s of pursed lip breathing. Since SAO2 did not recover above 90%, PT increased O2 to 2L, then 3L. SAO2 then 94% on 3L O2 at side of bed.  Transfers Overall transfer level: Needs assistance Equipment used: Rolling walker (2 wheeled) Transfers: Sit to/from Stand Sit to Stand: Supervision         General transfer comment: 5x sit to stand performed in 24.9s (BUE's used). No dizziness noted once standing. SAO2 remained above 90% during this task on 3L of O2. Pt confident and steady on his feet.   Ambulation/Gait Ambulation/Gait assistance: Min guard;+2 safety/equipment Ambulation Distance (Feet): 100 Feet Assistive device: Rolling walker (2 wheeled) Gait Pattern/deviations: Step-through pattern;Trunk flexed;Wide base of support     General Gait Details: Ambulated to/from pt room to far double doors in ICU with RW and chair follow for safety. Pt ambulated on 4L of O2. SAO2 remained above 92% while ambulating.   Stairs            Wheelchair Mobility    Modified Rankin (Stroke Patients Only)       Balance Overall balance assessment: No apparent balance deficits (not formally assessed)  Pertinent Vitals/Pain Pain Assessment: Faces Faces Pain Scale: Hurts little more Pain Location: R shoulder Pain Descriptors / Indicators: Grimacing;Guarding;Discomfort Pain Intervention(s): Limited activity within patient's tolerance;Monitored during session;Repositioned    Home Living Family/patient expects to be discharged to:: Private residence Living Arrangements: Spouse/significant other Available Help at Discharge: Family Type of Home: House Home Access: Stairs to enter Entrance Stairs-Rails: Can reach both Entrance Stairs-Number of Steps: 2 Home Layout: One level Home Equipment: None (Pt  has CPAP machine that a friend gave him.)      Prior Function Level of Independence: Independent         Comments: Pt working and driving prior admission to hospital.     Hand Dominance        Extremity/Trunk Assessment   Upper Extremity Assessment Upper Extremity Assessment: Overall WFL for tasks assessed (5/5 B for elbow flex/ext and grip)    Lower Extremity Assessment Lower Extremity Assessment: Overall WFL for tasks assessed (5/5 B for knee flex/ext and DF/PF)       Communication   Communication: No difficulties  Cognition Arousal/Alertness: Awake/alert Behavior During Therapy: WFL for tasks assessed/performed Overall Cognitive Status: Within Functional Limits for tasks assessed                                        General Comments      Exercises Other Exercises Other Exercises: Supine ther-ex x10 B included: SLR's and ankle pumps. Performed with supervision and verbal cueing for sequencing.   Assessment/Plan    PT Assessment Patient needs continued PT services  PT Problem List Decreased activity tolerance;Decreased mobility;Pain;Obesity       PT Treatment Interventions Gait training;Stair training;Functional mobility training;Therapeutic activities;Therapeutic exercise;Patient/family education    PT Goals (Current goals can be found in the Care Plan section)  Acute Rehab PT Goals Patient Stated Goal: to go home PT Goal Formulation: With patient Time For Goal Achievement: 12/05/16 Potential to Achieve Goals: Good    Frequency Min 2X/week   Barriers to discharge        Co-evaluation               AM-PAC PT "6 Clicks" Daily Activity  Outcome Measure Difficulty turning over in bed (including adjusting bedclothes, sheets and blankets)?: None Difficulty moving from lying on back to sitting on the side of the bed? : None Difficulty sitting down on and standing up from a chair with arms (e.g., wheelchair, bedside commode,  etc,.)?: A Little Help needed moving to and from a bed to chair (including a wheelchair)?: A Little Help needed walking in hospital room?: A Little Help needed climbing 3-5 steps with a railing? : A Lot 6 Click Score: 19    End of Session Equipment Utilized During Treatment: Gait belt;Oxygen (Up to 4L used to maintain SAO2 over 90% while ambulating) Activity Tolerance: Patient tolerated treatment well Patient left: in chair;with call bell/phone within reach Nurse Communication: Mobility status PT Visit Diagnosis: Other abnormalities of gait and mobility (R26.89);Pain Pain - Right/Left: Right Pain - part of body: Shoulder    Time: [id number] PT Time Calculation (min) (ACUTE ONLY): 38 min   Charges:         PT G Codes:        Mabeline CarasMichelle C Kinlie Janice, PT, SPT  Sula SodaMichelle Demerius Podolak 11/21/2016, 5:07 PM

## 2016-11-22 ENCOUNTER — Telehealth: Payer: Self-pay | Admitting: Internal Medicine

## 2016-11-22 LAB — PROCALCITONIN: Procalcitonin: <0.1 ng/mL

## 2016-11-22 LAB — BASIC METABOLIC PANEL
Anion gap: 5 (ref 5–15)
BUN: 25 mg/dL — ABNORMAL HIGH (ref 6–20)
CO2: 30 mmol/L (ref 22–32)
Calcium: 8.9 mg/dL (ref 8.9–10.3)
Chloride: 99 mmol/L — ABNORMAL LOW (ref 101–111)
Creatinine, Ser: 0.69 mg/dL (ref 0.61–1.24)
GFR calc Af Amer: >60 mL/min (ref >60)
GFR calc non Af Amer: >60 mL/min (ref >60)
Glucose, Bld: 112 mg/dL — ABNORMAL HIGH (ref 65–99)
Potassium: 4.5 mmol/L (ref 3.5–5.1)
Sodium: 134 mmol/L — ABNORMAL LOW (ref 135–145)

## 2016-11-22 LAB — GLUCOSE, CAPILLARY
Glucose-Capillary: 131 mg/dL — ABNORMAL HIGH (ref 65–99)
Glucose-Capillary: 174 mg/dL — ABNORMAL HIGH (ref 65–99)

## 2016-11-22 MED ORDER — DOXYCYCLINE HYCLATE 100 MG PO TABS: 100.0000 mg | ORAL_TABLET | Freq: Two times a day (BID) | ORAL | 0 refills | Status: DC

## 2016-11-22 MED ORDER — PREDNISONE 50 MG PO TABS: 50.0000 mg | ORAL_TABLET | Freq: Every day | ORAL | Status: DC

## 2016-11-22 MED ORDER — OXYCODONE-ACETAMINOPHEN 5-325 MG PO TABS: 1.0000 | ORAL_TABLET | Freq: Three times a day (TID) | ORAL | 0 refills | Status: DC | PRN

## 2016-11-22 MED ORDER — PREDNISONE 10 MG PO TABS: ORAL_TABLET | ORAL | 0 refills | Status: DC

## 2016-11-22 MED ORDER — OXYCODONE-ACETAMINOPHEN 5-325 MG PO TABS: 1.0000 | ORAL_TABLET | Freq: Three times a day (TID) | ORAL | Status: DC | PRN

## 2016-11-22 MED ORDER — MOMETASONE FURO-FORMOTEROL FUM 100-5 MCG/ACT IN AERO: 2.0000 | INHALATION_SPRAY | Freq: Two times a day (BID) | RESPIRATORY_TRACT | 0 refills | Status: DC

## 2016-11-22 NOTE — Progress Notes (Signed)
Patient discharged home per MD order. Prescription given to patient. All discharge instructions given and all questions answered. Patient verbalized understanding of all discharge instruction.

## 2016-11-22 NOTE — Discharge Summary (Signed)
[facility] - [facility]   PATIENT NAME: Jonathon Graham    MR#:  [id number]  DATE OF BIRTH:  Nov 01, 1955  DATE OF ADMISSION:  11/19/2016 ADMITTING PHYSICIAN: Tonye RoyaltyAlexis Hugelmeyer, DO  DATE OF DISCHARGE: 11/22/2016  PRIMARY CARE PHYSICIAN: Lorre MunroeBaity, Regina W, NP    ADMISSION DIAGNOSIS:  Hypoxia [R09.02] Right arm pain [M79.601] Chest pain, unspecified type [R07.9] Chronic obstructive pulmonary disease, unspecified COPD type (HCC) [J44.9]  DISCHARGE DIAGNOSIS:  Acute on chronic COPD exacerbation Right shoulder pain suspected degenerative arthritis patient will follow up with orthopedic as outpatient next week on his scheduled appointment History of obstructive sleep apnea  SECONDARY DIAGNOSIS:   Past Medical History:  Diagnosis Date  . Arthritis   . Benign prostatic hyperplasia (BPH) with urinary urgency    nodular  . COPD (chronic obstructive pulmonary disease) (HCC)   . Depression   . Diabetes mellitus without complication (HCC)   . Essential hypertension   . Fibromyalgia, primary   . Hyperlipidemia   . Hypertension   . Obesity     HOSPITAL COURSE:   61 y.o.malewith a history of COPD, depression, diabetes, hypertension, hyperlipidemia,now being admitted with:  #. Acute exacerbation of COPD - IV steroids---change to oral steroids and po doxycycline - Nebulizers, O2 therapy and expectorants as needed.  - CPAP as per home regimen - Required bipap on admission -Sats 96% on room air.  #. Chest pain rule out ACS, atypical -Continue aspirin and statin - Morphine, nitro, beta blocker added - Checked echo- EF 55%  #. Right shoulder and arm pain, likely musculoskeletal -Continue indomethacin, Tylenol, Voltaren gel -Patient will follow up with orthopedic on his scheduled appointment next metastatic -X-rays right shoulder did not show any acute fracture. Changes of DJD noted  #. History of hypertension -Continue Lasix, HCTZ  #.  History of hyperlipidemia - Continue atorvastatin  #. History of DM - Accuchecks achs with RISS coverage - Heart healthy, carb controlled diet  #. History of BPH - Continue Flomax  Overall at baseline discharge home CONSULTS OBTAINED:  Treatment Team:  Erin FullingKasa, Kurian, MD  DRUG ALLERGIES:   Allergies  Allergen Reactions  . Penicillins Anaphylaxis    DISCHARGE MEDICATIONS:   Current Discharge Medication List    START taking these medications   Details  doxycycline (VIBRA-TABS) 100 MG tablet Take 1 tablet (100 mg total) by mouth every 12 (twelve) hours. Qty: 8 tablet, Refills: 0    mometasone-formoterol (DULERA) 100-5 MCG/ACT AERO Inhale 2 puffs into the lungs 2 (two) times daily. Qty: 1 Inhaler, Refills: 0    oxyCODONE-acetaminophen (PERCOCET/ROXICET) 5-325 MG tablet Take 1 tablet by mouth every 8 (eight) hours as needed for moderate pain or severe pain. Qty: 15 tablet, Refills: 0    predniSONE (DELTASONE) 10 MG tablet Take 50 mg daily taper by 10 mg daily then s top Qty: 15 tablet, Refills: 0      CONTINUE these medications which have NOT CHANGED   Details  acetaminophen (TYLENOL) 500 MG tablet Take 1 tablet (500 mg total) by mouth every 8 (eight) hours as needed. Qty: 30 tablet, Refills: 0   Associated Diagnoses: Primary osteoarthritis of left knee    albuterol (PROVENTIL) (2.5 MG/3ML) 0.083% nebulizer solution Take 3 mLs (2.5 mg total) by nebulization every 6 (six) hours as needed for wheezing or shortness of breath. Qty: 150 mL, Refills: 6   Associated Diagnoses: Shortness of breath    furosemide (LASIX) 40 MG tablet Take 1 tablet (40 mg  total) by mouth daily. Qty: 30 tablet, Refills: 3    hydrochlorothiazide (MICROZIDE) 12.5 MG capsule Take 1 capsule (12.5 mg total) by mouth daily. Qty: 30 capsule, Refills: 5    metFORMIN (GLUCOPHAGE) 1000 MG tablet Take 1 tablet (1,000 mg total) by mouth 2 (two) times daily with a meal. Qty: 180 tablet, Refills: 6    Associated Diagnoses: Type 2 diabetes mellitus without complication, without long-term current use of insulin (HCC)    aspirin 81 MG tablet Take 81 mg by mouth daily. Takes 4 81mg  aspirin daily.    atorvastatin (LIPITOR) 20 MG tablet Take 1 tablet (20 mg total) by mouth daily. Qty: 90 tablet, Refills: 3    diclofenac sodium (VOLTAREN) 1 % GEL Apply 2 g topically 4 (four) times daily. Qty: 100 g, Refills: 2   Associated Diagnoses: Primary osteoarthritis of left knee    Elastic Bandages & Supports (MEDICAL COMPRESSION SOCKS) MISC 1 each by Does not apply route daily. Qty: 1 each, Refills: 0    gabapentin (NEURONTIN) 100 MG capsule Take 3 capsules, three times a day.  Follow written instructions to taper up to this dose. Qty: 270 capsule, Refills: 6   Associated Diagnoses: Peripheral sensory neuropathy due to type 2 diabetes mellitus (HCC)    indomethacin (INDOCIN) 50 MG capsule Take 1 capsule (50 mg total) by mouth 3 (three) times daily as needed. Qty: 15 capsule, Refills: 0    phentermine 37.5 MG capsule Take 1 capsule (37.5 mg total) by mouth every morning. Qty: 30 capsule, Refills: 0    tamsulosin (FLOMAX) 0.4 MG CAPS capsule Take 0.4 mg by mouth.        If you experience worsening of your admission symptoms, develop shortness of breath, life threatening emergency, suicidal or homicidal thoughts you must seek medical attention immediately by calling 911 or calling your MD immediately  if symptoms less severe.  You Must read complete instructions/literature along with all the possible adverse reactions/side effects for all the Medicines you take and that have been prescribed to you. Take any new Medicines after you have completely understood and accept all the possible adverse reactions/side effects.   Please note  You were cared for by a hospitalist during your hospital stay. If you have any questions about your discharge medications or the care you received while you were in the  hospital after you are discharged, you can call the unit and asked to speak with the hospitalist on call if the hospitalist that took care of you is not available. Once you are discharged, your primary care physician will handle any further medical issues. Please note that NO REFILLS for any discharge medications will be authorized once you are discharged, as it is imperative that you return to your primary care physician (or establish a relationship with a primary care physician if you do not have one) for your aftercare needs so that they can reassess your need for medications and monitor your lab values. Today   SUBJECTIVE   Doing well  VITAL SIGNS:  Blood pressure 126/70, pulse 64, temperature 97.9 F (36.6 C), temperature source Oral, resp. rate 16, height 5\' 8"  (1.727 m), weight (!) 156.8 kg (345 lb 10.9 oz), SpO2 96 %.  I/O:   Intake/Output Summary (Last 24 hours) at 11/22/16 1215 Last data filed at 11/22/16 1206  Gross per 24 hour  Intake              483 ml  Output  1750 ml  Net            -1267 ml    PHYSICAL EXAMINATION:  GENERAL:  61 y.o.-year-old patient lying in the bed with no acute distress. Morbid obesity EYES: Pupils equal, round, reactive to light and accommodation. No scleral icterus. Extraocular muscles intact.  HEENT: Head atraumatic, normocephalic. Oropharynx and nasopharynx clear.  NECK:  Supple, no jugular venous distention. No thyroid enlargement, no tenderness.  LUNGS: Normal breath sounds bilaterally, no wheezing, rales,rhonchi or crepitation. No use of accessory muscles of respiration.  CARDIOVASCULAR: S1, S2 normal. No murmurs, rubs, or gallops.  ABDOMEN: Soft, non-tender, non-distended. Bowel sounds present. No organomegaly or mass.  EXTREMITIES: No pedal edema, cyanosis, or clubbing.  NEUROLOGIC: Cranial nerves II through XII are intact. Muscle strength 5/5 in all extremities. Sensation intact. Gait not checked.  PSYCHIATRIC: The patient is  alert and oriented x 3.  SKIN: No obvious rash, lesion, or ulcer.   DATA REVIEW:   CBC   Recent Labs Lab 11/20/16 0432  WBC 10.2  HGB 15.2  HCT 47.4  PLT 173    Chemistries   Recent Labs Lab 11/19/16 2119  11/22/16 0428  NA 137  < > 134*  K 4.1  < > 4.5  CL 99*  < > 99*  CO2 33*  < > 30  GLUCOSE 138*  < > 112*  BUN 17  < > 25*  CREATININE 0.79  < > 0.69  CALCIUM 8.8*  < > 8.9  AST 20  --   --   ALT 17  --   --   ALKPHOS 46  --   --   BILITOT 0.8  --   --   < > = values in this interval not displayed.  Microbiology Results   Recent Results (from the past 240 hour(s))  MRSA PCR Screening     Status: None   Collection Time: 11/20/16  3:42 AM  Result Value Ref Range Status   MRSA by PCR NEGATIVE NEGATIVE Final    Comment:        The GeneXpert MRSA Assay (FDA approved for NASAL specimens only), is one component of a comprehensive MRSA colonization surveillance program. It is not intended to diagnose MRSA infection nor to guide or monitor treatment for MRSA infections.     RADIOLOGY:  Dg Shoulder Right  Result Date: 11/21/2016 CLINICAL DATA:  Shoulder pain and limited range of motion EXAM: RIGHT SHOULDER - 2+ VIEW COMPARISON:  None. FINDINGS: There is no evidence of fracture or dislocation. Mild irregularity in the superior aspect of the humeral head is noted which may represent a Hill-Sachs deformity from prior dislocation. Correlation with clinical history is recommended. No soft tissue abnormality is seen. The underlying bony thorax is within normal limits. IMPRESSION: Chronic appearing changes.  No acute abnormality noted. Electronically Signed   By: Alcide Clever M.D.   On: 11/21/2016 14:26     Management plans discussed with the patient, family and they are in agreement.  CODE STATUS:     Code Status Orders        Start     Ordered   11/20/16 0344  Full code  Continuous     11/20/16 0344    Code Status History    Date Active Date Inactive  Code Status Order ID Comments User Context   This patient has a current code status but no historical code status.      TOTAL TIME TAKING CARE OF  THIS PATIENT: *40* minutes.    Remmy Riffe M.D on 11/22/2016 at 12:15 PM  Between 7am to 6pm - Pager - [phone] After 6pm go to [email or web address] - Social research officer, government  [facility]  Office  [phone]  CC: Primary care physician; Lorre Munroe, NP

## 2016-11-22 NOTE — Progress Notes (Signed)
[facility]    Jonathon Graham was admitted to the Hospital on 11/19/2016 and Discharged  11/22/2016 and should be excused from work/school   for 4  days starting 11/19/2016 , may return to work/school without any restrictions.  Call Jonathon FinnerSona Klani Caridi MD, [facility]  [phone] with questions.  Jonathon Graham M.D on 11/22/2016,at 12:21 PM

## 2016-11-22 NOTE — Progress Notes (Signed)
Inpatient Diabetes Program Recommendations  AACE/ADA: New Consensus Statement on Inpatient Glycemic Control (2015)  Target Ranges:  Prepandial:   less than 140 mg/dL      Peak postprandial:   less than 180 mg/dL (1-2 hours)      Critically ill patients:  140 - 180 mg/dL   Results for Jonathon Graham, Jonathon Graham (MRN [id number]) as of 11/22/2016 11:33  Ref. Range 11/21/2016 07:36 11/21/2016 11:19 11/21/2016 15:56 11/21/2016 21:33 11/22/2016 07:45  Glucose-Capillary Latest Ref Range: 65 - 99 mg/dL 952143 (H) 841181 (H) 324266 (H) 206 (H) 131 (H)   Review of Glycemic Control  Diabetes history: DM2 Outpatient Diabetes medications: Metformin 1000 mg BID Current orders for Inpatient glycemic control: Novolog 0-20 units TID with meals, Novolog 0-5 units QHS  Inpatient Diabetes Program Recommendations: Insulin - Meal Coverage: If patient remains inpatient and steroids continued, please consider ordering Novolog 4 units TID with meals for meal coverage if patient eats at least 50% of meals.  Thanks, Orlando PennerMarie Liora Myles, RN, MSN, CDE Diabetes Coordinator Inpatient Diabetes Program [phone] (Team Pager from 8am to 5pm)

## 2016-11-22 NOTE — Telephone Encounter (Signed)
Spoke to pt's wife and pt has appt with ortho tomorrow afternoon, Jonathon Graham is out of appts this week and is out of the office next week... 1st available is with Dr Dayton MartesAron 2:30 for hosp f/u and pulm referral for sleep study

## 2016-11-22 NOTE — Plan of Care (Signed)
Problem: Education: Goal: Knowledge of [facility] Education information/materials will improve Outcome: Progressing VSS, free of falls during shift.  Reports R shoulder pain 2/10, not requiring intervention.  No other complaints overnight.  Bed in low position, call bell within reach.  WCTM.

## 2016-11-22 NOTE — Telephone Encounter (Signed)
[facility] is at [facility].Patient might be discharged today.  Patient's wife was told to call Rene KocherRegina and ask for a referral for sleep apnea test as soon as possible.  Patient's wife said patient [place].

## 2016-11-23 ENCOUNTER — Telehealth: Payer: Self-pay

## 2016-11-23 NOTE — Telephone Encounter (Signed)
Pt left v/m but not able to  Understand the message; left v/m requesting pt to cb.

## 2016-11-27 ENCOUNTER — Ambulatory Visit: Payer: BC Managed Care – PPO | Admitting: Family Medicine

## 2016-11-28 ENCOUNTER — Ambulatory Visit (INDEPENDENT_AMBULATORY_CARE_PROVIDER_SITE_OTHER): Payer: BC Managed Care – PPO | Admitting: Family Medicine

## 2016-11-28 ENCOUNTER — Encounter: Payer: Self-pay | Admitting: Family Medicine

## 2016-11-28 VITALS — BP 132/82 | HR 97 | Temp 98.6°F | Wt 333.0 lb

## 2016-11-28 DIAGNOSIS — Z6841 Body Mass Index (BMI) 40.0 and over, adult: Secondary | ICD-10-CM

## 2016-11-28 DIAGNOSIS — R351 Nocturia: Secondary | ICD-10-CM

## 2016-11-28 DIAGNOSIS — E7800 Pure hypercholesterolemia, unspecified: Secondary | ICD-10-CM

## 2016-11-28 DIAGNOSIS — E785 Hyperlipidemia, unspecified: Secondary | ICD-10-CM | POA: Diagnosis not present

## 2016-11-28 DIAGNOSIS — G4733 Obstructive sleep apnea (adult) (pediatric): Secondary | ICD-10-CM | POA: Diagnosis not present

## 2016-11-28 DIAGNOSIS — Z720 Tobacco use: Secondary | ICD-10-CM | POA: Diagnosis not present

## 2016-11-28 DIAGNOSIS — I10 Essential (primary) hypertension: Secondary | ICD-10-CM

## 2016-11-28 DIAGNOSIS — R0602 Shortness of breath: Secondary | ICD-10-CM

## 2016-11-28 DIAGNOSIS — E1142 Type 2 diabetes mellitus with diabetic polyneuropathy: Secondary | ICD-10-CM

## 2016-11-28 DIAGNOSIS — N401 Enlarged prostate with lower urinary tract symptoms: Secondary | ICD-10-CM

## 2016-11-28 DIAGNOSIS — M25511 Pain in right shoulder: Secondary | ICD-10-CM

## 2016-11-28 DIAGNOSIS — J439 Emphysema, unspecified: Secondary | ICD-10-CM | POA: Diagnosis not present

## 2016-11-28 DIAGNOSIS — G8929 Other chronic pain: Secondary | ICD-10-CM

## 2016-11-28 LAB — BASIC METABOLIC PANEL
BUN: 28 mg/dL — ABNORMAL HIGH (ref 7–25)
CO2: 34 mmol/L — ABNORMAL HIGH (ref 20–31)
Calcium: 9.5 mg/dL (ref 8.6–10.3)
Chloride: 97 mmol/L — ABNORMAL LOW (ref 98–110)
Creat: 1 mg/dL (ref 0.70–1.33)
Glucose, Bld: 266 mg/dL — ABNORMAL HIGH (ref 65–99)
Potassium: 4 mmol/L (ref 3.5–5.3)
Sodium: 141 mmol/L (ref 135–146)

## 2016-11-28 MED ORDER — GABAPENTIN 300 MG PO CAPS: 300.0000 mg | ORAL_CAPSULE | Freq: Two times a day (BID) | ORAL | 3 refills | Status: DC

## 2016-11-28 MED ORDER — ATORVASTATIN CALCIUM 20 MG PO TABS: 20.0000 mg | ORAL_TABLET | Freq: Every day | ORAL | 3 refills | Status: DC

## 2016-11-28 NOTE — Patient Instructions (Addendum)
Stop tamsulosin, stop hydrochlorothiazide. Continue lasix 40mg  daily.  I've refilled gabapentin and lipitor.  Continue daily dulera. Labs today.  I fill out FMLA forms.  We will refer you to lung doctor for sleep evaluation.  Ambulatory pulse ox today.  Return in 1 month for folllow up with Rene Kocheregina

## 2016-11-28 NOTE — Progress Notes (Addendum)
BP 132/82   Pulse 97   Temp 98.6 F (37 C) (Oral)   Wt (!) 333 lb (151 kg)   SpO2 98% Comment: RA  BMI 50.63 kg/m    CC: hosp f/u visit Subjective:    Patient ID: Jonathon Graham, male    DOB: [birth year], 61 y.o.   MRN: [id number]  HPI: Jonathon Graham is a 61 y.o. male presenting on 11/28/2016 for Hospitalization Follow-up and Referral (Pulm for sleep study....20")   Patient of Regina's new to me here today for hosp f/u visit after hospitalization last month with acute COPD exacerbation treated with iv steroids, nebulizers, oxygen as needed. This was transitioned to oral prednisone and doxycycline courses which he has completed. He did require bipap on admission. He was placed on oxygen therapy, but this was weaned prior to discharge. He presented to the hospital with R shoulder pain - planned f/u with ortho. He did have atypical chest pain while in hospital, this was ruled out for ACS. He has not had further chest pain episodes since then.   Works at [employer] - has returned to work already. Lots of walking outdoors, in heat and humidity despite exertional dyspnea.   Since home, notes some fluctuations in pedal edema L>R. Lasix was started 2-3 months ago. He is on both lasix and hctz. Dyspnea is improving. Denies cough, chest pain. Last cigarette was last month - he continues working on quitting.   Using albuterol nebulizer once daily.  He has been taking dulera 2 puffs twice daily.  He has not been taking lipitor.  He feels tamsulosin is causing extensor surface hyperpigmented thickening of skin patches. Desires to stop this.  Ran out of gabapentin - requests refill.   23 lb weight loss noted over last few months.   Requests FMLA forms filled out for [facility]. Requests pulm referral to establish for known OSA as he's not currently using CPAP "need new machine". Easily falls asleep during day at work. + snoring, witnessed apneas, parox nocturnal dyspneas. ESS = 18  today.  DATE OF ADMISSION:  11/19/2016  DATE OF DISCHARGE: 11/22/2016 TCM phone call not done.  D/C Dx: 1. Acute on chronic COPD exacerbation 2. Right shoulder pain suspected degenerative arthritis patient will follow up with orthopedic as outpatient next week on his scheduled appointment 3. History of obstructive sleep apnea   Lab Results  Component Value Date   HGBA1C 6.9 (H) 11/28/2016    Relevant past medical, surgical, family and social history reviewed and updated as indicated. Interim medical history since our last visit reviewed. Allergies and medications reviewed and updated. Outpatient Medications Prior to Visit  Medication Sig Dispense Refill  . acetaminophen (TYLENOL) 500 MG tablet Take 1 tablet (500 mg total) by mouth every 8 (eight) hours as needed. 30 tablet 0  . albuterol (PROVENTIL) (2.5 MG/3ML) 0.083% nebulizer solution Take 3 mLs (2.5 mg total) by nebulization every 6 (six) hours as needed for wheezing or shortness of breath. 150 mL 6  . aspirin 81 MG tablet Take 81 mg by mouth daily. Takes 4 81mg  aspirin daily.    . diclofenac sodium (VOLTAREN) 1 % GEL Apply 2 g topically 4 (four) times daily. 100 g 2  . Elastic Bandages & Supports (MEDICAL COMPRESSION SOCKS) MISC 1 each by Does not apply route daily. 1 each 0  . furosemide (LASIX) 40 MG tablet Take 1 tablet (40 mg total) by mouth daily. 30 tablet 3  . indomethacin (INDOCIN) 50 MG  capsule Take 1 capsule (50 mg total) by mouth 3 (three) times daily as needed. 15 capsule 0  . metFORMIN (GLUCOPHAGE) 1000 MG tablet Take 1 tablet (1,000 mg total) by mouth 2 (two) times daily with a meal. 180 tablet 6  . mometasone-formoterol (DULERA) 100-5 MCG/ACT AERO Inhale 2 puffs into the lungs 2 (two) times daily. 1 Inhaler 0  . oxyCODONE-acetaminophen (PERCOCET/ROXICET) 5-325 MG tablet Take 1 tablet by mouth every 8 (eight) hours as needed for moderate pain or severe pain. 15 tablet 0  . phentermine 37.5 MG capsule Take 1 capsule  (37.5 mg total) by mouth every morning. 30 capsule 0  . atorvastatin (LIPITOR) 20 MG tablet Take 1 tablet (20 mg total) by mouth daily. 90 tablet 3  . doxycycline (VIBRA-TABS) 100 MG tablet Take 1 tablet (100 mg total) by mouth every 12 (twelve) hours. 8 tablet 0  . gabapentin (NEURONTIN) 100 MG capsule Take 3 capsules, three times a day.  Follow written instructions to taper up to this dose. 270 capsule 6  . hydrochlorothiazide (MICROZIDE) 12.5 MG capsule Take 1 capsule (12.5 mg total) by mouth daily. 30 capsule 5  . predniSONE (DELTASONE) 10 MG tablet Take 50 mg daily taper by 10 mg daily then s top 15 tablet 0  . tamsulosin (FLOMAX) 0.4 MG CAPS capsule Take 0.4 mg by mouth.     Facility-Administered Medications Prior to Visit  Medication Dose Route Frequency Provider Last Rate Last Dose  . ipratropium-albuterol (DUONEB) 0.5-2.5 (3) MG/3ML nebulizer solution 3 mL  3 mL Nebulization Q6H Krebs, Amy Lauren, NP         Per HPI unless specifically indicated in ROS section below Review of Systems     Objective:    BP 132/82   Pulse 97   Temp 98.6 F (37 C) (Oral)   Wt (!) 333 lb (151 kg)   SpO2 98% Comment: RA  BMI 50.63 kg/m   Wt Readings from Last 3 Encounters:  11/28/16 (!) 333 lb (151 kg)  11/20/16 (!) 345 lb 10.9 oz (156.8 kg)  10/27/16 (!) 356 lb (161.5 kg)    Physical Exam  Constitutional: He is oriented to person, place, and time. He appears well-developed and well-nourished. No distress.  HENT:  Head: Normocephalic and atraumatic.  Mouth/Throat: Oropharynx is clear and moist. No oropharyngeal exudate.  Eyes: Conjunctivae and EOM are normal. Pupils are equal, round, and reactive to light. No scleral icterus.  Neck: Normal range of motion. Neck supple.  Cardiovascular: Normal rate, regular rhythm, normal heart sounds and intact distal pulses.   No murmur heard. Pulmonary/Chest: Effort normal and breath sounds normal. No respiratory distress. He has no wheezes. He has no  rales.  Musculoskeletal: He exhibits edema (1+ bilateral).  Neurological: He is alert and oriented to person, place, and time.  Skin: Skin is warm and dry. Rash noted.  rough hyperkeratotic hyperpigmented rash on extensor surfaces of elbows.   Psychiatric: He has a normal mood and affect.  Nursing note and vitals reviewed.  2D echo 11/20/2016: Study Conclusions - Procedure narrative: Transthoracic echocardiography. The study   was technically difficult. - Left ventricle: Wall thickness was increased in a pattern of mild   LVH. Systolic function was normal. The estimated ejection   fraction was in the range of 55% to 60%. - Aortic valve: Valve area (Vmax): 2.44 cm^2. - Mitral valve: Valve area by pressure half-time: 2.2 cm^2. - Right ventricle: The cavity size was moderately dilated. - Right atrium: The  atrium was mildly dilated.  Ambulatory pulse ox:  desats to 89%, quickly recovers with rest.    Assessment & Plan:  FMLA forms filled out and faxed 12/04/2016  Problem List Items Addressed This Visit    BMI 50.0-59.9, adult (HCC)    Continue to encourage sustainable weight loss through healthy diet and lifestyle changes. He is now on phentermine - discussed monitoring blood pressures on this medication.       BPH (benign prostatic hyperplasia)    He feels brand flomax worked better than generic tamsulosin. He also feels generic is causing rough rash on elbows. Will discontinue tamsulosin for now.       COPD (chronic obstructive pulmonary disease) (HCC)    Recovering from recent flare during hospitalization. Discussed importance of continuing controller dulera as well as PRN albuterol. Encouraged full tobacco cessation. He continues working but is limited by exertional dyspnea especially on very hot humid days. Has had multiple absences from work due to difficulty breathing. Requests FMLA forms filled out - which I will do.       HLD (hyperlipidemia)    He was not taking  atorvastatin. Discussed reason to take - encouraged restart - refilled today.       Relevant Medications   atorvastatin (LIPITOR) 20 MG tablet   HTN (hypertension)    He was on hctz and lasix - I suggested he discontinue hctz, continue lasix 40mg  daily, monitor BP for control. Will need to closely monitor phentermine use as well.       Relevant Medications   atorvastatin (LIPITOR) 20 MG tablet   Right shoulder pain    This was actual reason he seeked care at ER - has planned ortho f/u.       Severe obstructive sleep apnea    Presumed increased pulmonary pressures from OSA as evidenced by dilated right heart on recent echo.  He really needs to restart CPAP.  Will refer to pulm to establish care per patient request.  Will continue lasix 40mg  daily.  ESS = 18      Relevant Orders   Ambulatory referral to Pulmonology   Tobacco use    Last cigarette was 11/19/2016. Congratulated and encouraged cessation.       Type 2 diabetes, controlled, with peripheral neuropathy (HCC)    Compliant with metformin. Will update A1c.  Endorses h/o periph neuropathy - requests gabapentin refill. Medication refilled.       Relevant Medications   gabapentin (NEURONTIN) 300 MG capsule   atorvastatin (LIPITOR) 20 MG tablet   Other Relevant Orders   Hemoglobin A1c (Completed)    Other Visit Diagnoses    Shortness of breath    -  Primary   Relevant Orders   Basic metabolic panel (Completed)   Brain natriuretic peptide (Completed)       Follow up plan: Return in about 1 month (around 12/29/2016) for follow up visit.  Eustaquio Boyden, MD

## 2016-11-29 DIAGNOSIS — M25511 Pain in right shoulder: Secondary | ICD-10-CM | POA: Insufficient documentation

## 2016-11-29 LAB — BRAIN NATRIURETIC PEPTIDE: Brain Natriuretic Peptide: 65.8 pg/mL (ref <100)

## 2016-11-29 LAB — HEMOGLOBIN A1C
Hgb A1c MFr Bld: 6.9 % — ABNORMAL HIGH (ref <5.7)
Mean Plasma Glucose: 151 mg/dL

## 2016-11-29 NOTE — Assessment & Plan Note (Signed)
This was actual reason he seeked care at ER - has planned ortho f/u.

## 2016-11-29 NOTE — Assessment & Plan Note (Deleted)
Endorses h/o this - requests gabapentin refill. Medication refilled.

## 2016-11-29 NOTE — Assessment & Plan Note (Addendum)
Presumed increased pulmonary pressures from OSA as evidenced by dilated right heart on recent echo.  He really needs to restart CPAP.  Will refer to pulm to establish care per patient request.  Will continue lasix 40mg  daily.  ESS = 18

## 2016-11-29 NOTE — Assessment & Plan Note (Signed)
Continue to encourage sustainable weight loss through healthy diet and lifestyle changes. He is now on phentermine - discussed monitoring blood pressures on this medication.

## 2016-11-29 NOTE — Assessment & Plan Note (Addendum)
Compliant with metformin. Will update A1c.  Endorses h/o periph neuropathy - requests gabapentin refill. Medication refilled.

## 2016-11-29 NOTE — Assessment & Plan Note (Signed)
He was not taking atorvastatin. Discussed reason to take - encouraged restart - refilled today.

## 2016-11-29 NOTE — Assessment & Plan Note (Signed)
He feels brand flomax worked better than generic tamsulosin. He also feels generic is causing rough rash on elbows. Will discontinue tamsulosin for now.

## 2016-11-29 NOTE — Assessment & Plan Note (Addendum)
He was on hctz and lasix - I suggested he discontinue hctz, continue lasix 40mg  daily, monitor BP for control. Will need to closely monitor phentermine use as well.

## 2016-11-29 NOTE — Assessment & Plan Note (Signed)
Last cigarette was 11/19/2016. Congratulated and encouraged cessation.

## 2016-11-29 NOTE — Assessment & Plan Note (Signed)
Recovering from recent flare during hospitalization. Discussed importance of continuing controller dulera as well as PRN albuterol. Encouraged full tobacco cessation. He continues working but is limited by exertional dyspnea especially on very hot humid days. Has had multiple absences from work due to difficulty breathing. Requests FMLA forms filled out - which I will do.

## 2016-12-06 ENCOUNTER — Other Ambulatory Visit: Payer: Self-pay

## 2016-12-06 MED ORDER — MOMETASONE FURO-FORMOTEROL FUM 100-5 MCG/ACT IN AERO: 2.0000 | INHALATION_SPRAY | Freq: Two times a day (BID) | RESPIRATORY_TRACT | 0 refills | Status: DC

## 2016-12-06 NOTE — Telephone Encounter (Signed)
Pt request refill of Dulera to [facility] [address]pt was seen 11/28/16 and was advised to continue the dulera and schedule 1 month f/u with [name] NP. Will refill dulera and pt wants to cb to schedule appt with [name] NP. Pt voiced understanding.

## 2016-12-07 ENCOUNTER — Telehealth: Payer: Self-pay

## 2016-12-07 NOTE — Telephone Encounter (Signed)
Pt's wife left a vm to check on FMLA paperwork so pt can go back to work and requesting lab results.

## 2016-12-07 NOTE — Telephone Encounter (Signed)
Blood work released through [employer]. I did fill forms out last week and placed in my CMA box. I don't know where they are now.

## 2016-12-07 NOTE — Telephone Encounter (Signed)
He was referred to ortho. Has he seen them?

## 2016-12-07 NOTE — Telephone Encounter (Signed)
Patient is aware of lab results I looked under media tab and there is a copy scanned into chart of the paperwork... I let pt know paperwork had been faxed  Pt reports that his back has been still hurting a great deal. Pt wanted to know if he could get a refill on the percocet... Please advise

## 2016-12-08 NOTE — Telephone Encounter (Signed)
Pt had office visit.

## 2016-12-11 ENCOUNTER — Other Ambulatory Visit: Payer: Self-pay

## 2016-12-11 MED ORDER — FLUTICASONE FUROATE-VILANTEROL 200-25 MCG/INH IN AEPB: 1.0000 | INHALATION_SPRAY | Freq: Every day | RESPIRATORY_TRACT | 1 refills | Status: DC

## 2016-12-11 MED ORDER — TRAMADOL HCL 50 MG PO TABS: 50.0000 mg | ORAL_TABLET | Freq: Two times a day (BID) | ORAL | 0 refills | Status: DC | PRN

## 2016-12-11 NOTE — Telephone Encounter (Signed)
[name] NP is out of office today; pt saw Dr Reece AgarG on 11/28/16; pt said Jonathon Graham is $350.00 and request different med to [facility] [address]. Pt also said he discussed his pain when he saw Dr Reece AgarG but had 7 of oxycodone apap left at that time. Now pt is out of med and request rx oxycodone apap. Call when ready for pick up. Last printed # 15 on 11/22/16 by Dr Allena KatzPatel.(ED doctor). Pt request cb.

## 2016-12-11 NOTE — Telephone Encounter (Signed)
msg also sent to Dr Reece AgarG as pt called back and he auth to call in Tramadol--- pt is aware as instructed and reports he has Ortho appt Fri

## 2016-12-11 NOTE — Telephone Encounter (Signed)
We did not discuss pain in detail - and I thought he had planned ortho f/u for shoulder pain. I presume he's using narcotic for shoulder? Would recommend we trial tramadol in its place, may phone in #20 while he gets in with ortho or PCP as needs further evaluation of this.  Will send in breo for patient to price out 1 puff daily in place of dulera.

## 2016-12-11 NOTE — Telephone Encounter (Signed)
Rx called into pharmacy Pt is aware as instructed... Pt has appt scheduled with ortho Fri

## 2016-12-12 NOTE — Telephone Encounter (Signed)
noted 

## 2016-12-21 ENCOUNTER — Other Ambulatory Visit: Payer: Self-pay | Admitting: Internal Medicine

## 2016-12-21 ENCOUNTER — Other Ambulatory Visit: Payer: Self-pay | Admitting: Family Medicine

## 2016-12-21 NOTE — Telephone Encounter (Signed)
Denied. From prior refill request dated 12/11/2016: "We did not discuss pain in detail - and I thought he had planned ortho f/u for shoulder pain. I presume he's using narcotic for shoulder? Would recommend we trial tramadol in its place, may phone in #20 while he gets in with ortho or PCP as needs further evaluation of this." Will cc PCP.

## 2017-01-12 NOTE — Progress Notes (Deleted)
[facility] [place] Pulmonary Medicine Consultation      Assessment and Plan:     Date: 01/12/2017  MRN# [id number] Jonathon Graham [birth year]  Referring Physician:   Virgle Shortell is a 61 y.o. old male seen in consultation for chief complaint of:   No chief complaint on file.   HPI:   Images reviewed the CT chest 11/19/2016 ; his lung volume secondary to obesity, mild air trapping versus motion artifact.   PMHX:   Past Medical History:  Diagnosis Date  . Arthritis   . Benign prostatic hyperplasia (BPH) with urinary urgency    nodular  . COPD (chronic obstructive pulmonary disease) (HCC)   . Depression   . Diabetes mellitus without complication (HCC)   . Essential hypertension   . Fibromyalgia, primary   . Hyperlipidemia   . Hypertension   . Obesity    Surgical Hx:  No past surgical history on file. Family Hx:  Family History  Problem Relation Age of Onset  . Heart disease Father   . Diabetes Mellitus II Sister   . Myasthenia gravis Sister   . Fibrocystic breast disease Sister   . Hypertension Sister   . Diabetes Mellitus II Brother    Social Hx:   Social History  Substance Use Topics  . Smoking status: Current Some Day Smoker    Types: Cigarettes    Last attempt to quit: 04/04/2015  . Smokeless tobacco: Never Used     Comment: 3-4 daily  . Alcohol use No   Medication:    Current Outpatient Prescriptions:  .  acetaminophen (TYLENOL) 500 MG tablet, Take 1 tablet (500 mg total) by mouth every 8 (eight) hours as needed., Disp: 30 tablet, Rfl: 0 .  albuterol (PROVENTIL) (2.5 MG/3ML) 0.083% nebulizer solution, Take 3 mLs (2.5 mg total) by nebulization every 6 (six) hours as needed for wheezing or shortness of breath., Disp: 150 mL, Rfl: 6 .  aspirin 81 MG tablet, Take 81 mg by mouth daily. Takes 4 81mg  aspirin daily., Disp: , Rfl:  .  atorvastatin (LIPITOR) 20 MG tablet, Take 1 tablet (20 mg total) by mouth daily., Disp: 90 tablet, Rfl: 3 .  diclofenac sodium  (VOLTAREN) 1 % GEL, Apply 2 g topically 4 (four) times daily., Disp: 100 g, Rfl: 2 .  Elastic Bandages & Supports (MEDICAL COMPRESSION SOCKS) MISC, 1 each by Does not apply route daily., Disp: 1 each, Rfl: 0 .  fluticasone furoate-vilanterol (BREO ELLIPTA) 200-25 MCG/INH AEPB, Inhale 1 puff into the lungs daily., Disp: 30 each, Rfl: 1 .  furosemide (LASIX) 40 MG tablet, Take 1 tablet (40 mg total) by mouth daily., Disp: 30 tablet, Rfl: 3 .  gabapentin (NEURONTIN) 300 MG capsule, Take 1 capsule (300 mg total) by mouth 2 (two) times daily., Disp: 180 capsule, Rfl: 3 .  indomethacin (INDOCIN) 50 MG capsule, Take 1 capsule (50 mg total) by mouth 3 (three) times daily as needed., Disp: 15 capsule, Rfl: 0 .  metFORMIN (GLUCOPHAGE) 1000 MG tablet, Take 1 tablet (1,000 mg total) by mouth 2 (two) times daily with a meal., Disp: 180 tablet, Rfl: 6 .  mometasone-formoterol (DULERA) 100-5 MCG/ACT AERO, Inhale 2 puffs into the lungs 2 (two) times daily., Disp: 1 Inhaler, Rfl: 0 .  phentermine 37.5 MG capsule, Take 1 capsule (37.5 mg total) by mouth every morning., Disp: 30 capsule, Rfl: 0 .  traMADol (ULTRAM) 50 MG tablet, Take 1 tablet (50 mg total) by mouth every 12 (twelve) hours as needed., Disp: 20  tablet, Rfl: 0  Current Facility-Administered Medications:  .  ipratropium-albuterol (DUONEB) 0.5-2.5 (3) MG/3ML nebulizer solution 3 mL, 3 mL, Nebulization, Q6H, Krebs, Amy Lauren, NP   Allergies:  Penicillins  Review of Systems: Gen:  Denies  fever, sweats, chills HEENT: Denies blurred vision, double vision. bleeds, sore throat Cvc:  No dizziness, chest pain. Resp:   Denies cough or sputum production, shortness of breath Gi: Denies swallowing difficulty, stomach pain. Gu:  Denies bladder incontinence, burning urine Ext:   No Joint pain, stiffness. Skin: No skin rash,  hives  Endoc:  No polyuria, polydipsia. Psych: No depression, insomnia. Other:  All other systems were reviewed with the patient and  were negative other that what is mentioned in the HPI.   Physical Examination:   VS: There were no vitals taken for this visit.  General Appearance: No distress  Neuro:without focal findings,  speech normal,  HEENT: PERRLA, EOM intact.   Pulmonary: normal breath sounds, No wheezing.  CardiovascularNormal S1,S2.  No m/r/g.   Abdomen: Benign, Soft, non-tender. Renal:  No costovertebral tenderness  GU:  No performed at this time. Endoc: No evident thyromegaly, no signs of acromegaly. Skin:   warm, no rashes, no ecchymosis  Extremities: normal, no cyanosis, clubbing.  Other findings:    LABORATORY PANEL:   CBC No results for input(s): WBC, HGB, HCT, PLT in the last 168 hours. ------------------------------------------------------------------------------------------------------------------  Chemistries  No results for input(s): NA, K, CL, CO2, GLUCOSE, BUN, CREATININE, CALCIUM, MG, AST, ALT, ALKPHOS, BILITOT in the last 168 hours.  Invalid input(s): GFRCGP ------------------------------------------------------------------------------------------------------------------  Cardiac Enzymes No results for input(s): TROPONINI in the last 168 hours. ------------------------------------------------------------  RADIOLOGY:  No results found.     Thank  you for the consultation and for allowing [facility] to assist in the care of your patient. Our recommendations are noted above.  Please contact us if we can be of further service.   Wells Guiles, MD.  Board Certified in Internal Medicine, Pulmonary Medicine, Critical Care Medicine, and Sleep Medicine.  [facility] Office Number: [phone]  Santiago Glad, M.D.  Billy Fischer, M.D  01/12/2017

## 2017-01-15 ENCOUNTER — Institutional Professional Consult (permissible substitution): Payer: Self-pay | Admitting: Internal Medicine

## 2017-01-30 NOTE — Progress Notes (Addendum)
[facility] Consultation      Assessment and Plan:  61 yo male with sleep apnea, morbid obesity, dyspnea, possible obesity hypoventilation.   OSA  -Diagnosis of sleep apnea approximately 2 years ago, he now has his own PAP Device, but has not yet set the pressure and has not started using it. -We will review his most recent sleep study and determine what pressure is machine used to be set up. Follow up in 3 months. --Addendum: Previous CPAP was set at 16, we'll have the patient put his machine at this pressure.  Insomnia with excessive daytime sleepiness. 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 -He has multiple etiologies for his insomnia and daytime sleepiness, which may include peripheral neuropathy, poor sleep hygiene, anxiety. He may also have some degree of circadian rhythm disorder given his early rising time at 2:30 AM. -Advised him not to take naps during the day, we discussed improvement of sleep hygiene. He may benefit from Ambien or similar medication, however, we will look at his baseline CO2 levels first.  Obesity -Severe morbid obesity with BMI of 51, this may be contributing to obesity hypoventilation syndrome and elevated CO2 levels. -We'll check ABG.  Dyspnea. -Patient is currently on Breo, possibly for asthma. -His dyspnea may be multifactorial from asthma, as well as severe morbid obesity. Discussed the importance of weight loss going forwards. We'll also check a pulmonary function test to look for evidence of restrictive lung disease   Date: 02/01/2017  MRN# [id number] Jonathon Graham 1956/01/08    Jonathon Graham is a 61 y.o. old male seen in consultation for chief complaint of:    Chief Complaint  Patient presents with  . Advice Only    referred by Dr. Jeral PinchJ. Gutierez  . Sleep Apnea    HPI:   Patient presents today with complaints of excessive daytime sleepiness, as well as trouble sleeping. He notes that he cannot sleep for more than one and a half  to 3 hours. It takes him 1-2 hours to fall asleep. His Epworth score is elevated at 22. His medications include Ultram, Neurontin, phentermine.  He thinks that he is only getting 2-3 hours per sleep, and he feels very sleepy during the day. He got a CPAP about 2 years ago for sleep apnea. He could not afford the copay of about 100 dollars per month. His brother has not purchased him a machine about 2 months ago he has not started wearing it yet because he has not set a pressure.   He takes his medicine around 8 pm, he goes to bed at 9:30pm. He watches tv in bed until he falls asleep which will vary a lot, but falls asleep at 10:30 pm. 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  He gets home from work at 2:30 pm. He takes naps 2 or 3 days per week of about 2 hours.  On days that he is now working, his sleep habits are the same. He has a lot of issues with pain, his wife is present and says that he has a lot of diabetic pain in his legs.   He frequently getting leg cramps at night, he does not complain of RLS symptoms. He was prescribed Remus Lofflerambien but was worried about taking it with his OSA.   He takes Breo for breathing issues, he is not sure if he has been diagnosed with asthma.   **Images personally reviewed, CT chest 11/19/2016;  appears lung volume secondary to body habitus with bibasilar atelectasis, otherwise lungs are unremarkable.  Addendum: Review of outside sleep study 04/17/13; split-night AHI equals 101.4 consistent with very severe sleep apnea, no central apnea noted. Patient was started on CPAP, titrated to a pressure of 16. At this pressure lowest sat was 83%. Patient slept for 51 minutes. REM supine of 9.4 minutes. AHI was 6.4. At CPAP of 16, 8.1. A CPAP of 15. The patient was recommended to use CPAP at 16. Overall this test is insistent with very severe. Obstructive sleep apnea, sleep-related hypoxemia, CPAP was started at 16.  **Blood gas 02/01/17;  7.39/53/52/32.1. Consistent with chronic hypercapnic and hypoxic respiratory failure.   PMHX:   Past Medical History:  Diagnosis Date  . Arthritis   . Benign prostatic hyperplasia (BPH) with urinary urgency    nodular  . COPD (chronic obstructive pulmonary disease) (HCC)   . Depression   . Diabetes mellitus without complication (HCC)   . Essential hypertension   . Fibromyalgia, primary   . Hyperlipidemia   . Hypertension   . Obesity    Surgical Hx:  History reviewed. No pertinent surgical history. Family Hx:  Family History  Problem Relation Age of Onset  . Heart disease Father   . Diabetes Mellitus II Sister   . Myasthenia gravis Sister   . Fibrocystic breast disease Sister   . Hypertension Sister   . Diabetes Mellitus II Brother    Social Hx:   Social History  Substance Use Topics  . Smoking status: Current Some Day Smoker    Types: Cigarettes    Last attempt to quit: 04/04/2015  . Smokeless tobacco: Never Used     Comment: 3-4 daily  . Alcohol use No   Medication:    Current Outpatient Prescriptions:  .  acetaminophen (TYLENOL) 500 MG tablet, Take 1 tablet (500 mg total) by mouth every 8 (eight) hours as needed., Disp: 30 tablet, Rfl: 0 .  albuterol (PROVENTIL) (2.5 MG/3ML) 0.083% nebulizer solution, Take 3 mLs (2.5 mg total) by nebulization every 6 (six) hours as needed for wheezing or shortness of breath., Disp: 150 mL, Rfl: 6 .  aspirin 81 MG tablet, Take 81 mg by mouth daily. Takes 4 81mg  aspirin daily., Disp: , Rfl:  .  atorvastatin (LIPITOR) 20 MG tablet, Take 1 tablet (20 mg total) by mouth daily., Disp: 90 tablet, Rfl: 3 .  diclofenac sodium (VOLTAREN) 1 % GEL, Apply 2 g topically 4 (four) times daily., Disp: 100 g, Rfl: 2 .  Elastic Bandages & Supports (MEDICAL COMPRESSION SOCKS) MISC, 1 each by Does not apply route daily., Disp: 1 each, Rfl: 0 .  fluticasone furoate-vilanterol (BREO ELLIPTA) 200-25 MCG/INH AEPB, Inhale 1 puff into the lungs daily., Disp:  30 each, Rfl: 1 .  furosemide (LASIX) 40 MG tablet, Take 1 tablet (40 mg total) by mouth daily., Disp: 30 tablet, Rfl: 3 .  gabapentin (NEURONTIN) 300 MG capsule, Take 1 capsule (300 mg total) by mouth 2 (two) times daily., Disp: 180 capsule, Rfl: 3 .  indomethacin (INDOCIN) 50 MG capsule, Take 1 capsule (50 mg total) by mouth 3 (three) times daily as needed., Disp: 15 capsule, Rfl: 0 .  metFORMIN (GLUCOPHAGE) 1000 MG tablet, Take 1 tablet (1,000 mg total) by mouth 2 (two) times daily with a meal., Disp: 180 tablet, Rfl: 6 .  mometasone-formoterol (DULERA) 100-5 MCG/ACT AERO, Inhale 2 puffs into the lungs 2 (two) times daily., Disp: 1 Inhaler, Rfl: 0 .  phentermine  37.5 MG capsule, Take 1 capsule (37.5 mg total) by mouth every morning., Disp: 30 capsule, Rfl: 0 .  traMADol (ULTRAM) 50 MG tablet, Take 1 tablet (50 mg total) by mouth every 12 (twelve) hours as needed., Disp: 20 tablet, Rfl: 0  Current Facility-Administered Medications:  .  ipratropium-albuterol (DUONEB) 0.5-2.5 (3) MG/3ML nebulizer solution 3 mL, 3 mL, Nebulization, Q6H, Krebs, Amy Lauren, NP   Allergies:  Penicillins  Review of Systems: Gen:  Denies  fever, sweats, chills HEENT: Denies blurred vision, double vision. bleeds, sore throat Cvc:  No dizziness, chest pain. Resp:   Denies cough or sputum production, shortness of breath Gi: Denies swallowing difficulty, stomach pain. Gu:  Denies bladder incontinence, burning urine Ext:   No Joint pain, stiffness. Skin: No skin rash,  hives  Endoc:  No polyuria, polydipsia. Psych: No depression, insomnia. Other:  All other systems were reviewed with the patient and were negative other that what is mentioned in the HPI.   Physical Examination:   VS: BP 128/74 (BP Location: Left Arm, Cuff Size: Large)   Pulse 67   Resp 16   Ht 5\' 8"  (1.727 m)   Wt (!) 339 lb (153.8 kg)   SpO2 97%   BMI 51.54 kg/m   General Appearance: No distress  Neuro:without focal findings,  speech  normal,  HEENT: PERRLA, EOM intact.   Pulmonary: normal breath sounds, No wheezing.  CardiovascularNormal S1,S2.  No m/r/g.   Abdomen: Benign, Soft, non-tender. Renal:  No costovertebral tenderness  GU:  No performed at this time. Endoc: No evident thyromegaly, no signs of acromegaly. Skin:   warm, no rashes, no ecchymosis  Extremities: normal, no cyanosis, clubbing.  Other findings:    LABORATORY PANEL:   CBC No results for input(s): WBC, HGB, HCT, PLT in the last 168 hours. ------------------------------------------------------------------------------------------------------------------  Chemistries  No results for input(s): NA, K, CL, CO2, GLUCOSE, BUN, CREATININE, CALCIUM, MG, AST, ALT, ALKPHOS, BILITOT in the last 168 hours.  Invalid input(s): GFRCGP ------------------------------------------------------------------------------------------------------------------  Cardiac Enzymes No results for input(s): TROPONINI in the last 168 hours. ------------------------------------------------------------  RADIOLOGY:  No results found.     Thank  you for the consultation and for allowing [facility] to assist in the care of your patient. Our recommendations are noted above.  Please contact us if we can be of further service.   Wells Guiles, MD.  Board Certified in Internal Medicine, Pulmonary Medicine, Critical Care Medicine, and Sleep Medicine.  [facility] Office Number: [phone]  Santiago Glad, M.D.  Billy Fischer, M.D  02/01/2017

## 2017-02-01 ENCOUNTER — Ambulatory Visit (INDEPENDENT_AMBULATORY_CARE_PROVIDER_SITE_OTHER): Payer: BC Managed Care – PPO | Admitting: Internal Medicine

## 2017-02-01 ENCOUNTER — Other Ambulatory Visit
Admission: RE | Admit: 2017-02-01 | Discharge: 2017-02-01 | Disposition: A | Discharge status: Home / Self Care | Payer: BC Managed Care – PPO | Source: Ambulatory Visit | Attending: Internal Medicine | Admitting: Internal Medicine

## 2017-02-01 ENCOUNTER — Encounter: Payer: Self-pay | Admitting: Internal Medicine

## 2017-02-01 ENCOUNTER — Ambulatory Visit (HOSPITAL_COMMUNITY): Payer: BC Managed Care – PPO

## 2017-02-01 ENCOUNTER — Ambulatory Visit
Admission: RE | Admit: 2017-02-01 | Discharge: 2017-02-01 | Disposition: A | Discharge status: Home / Self Care | Payer: BC Managed Care – PPO | Source: Ambulatory Visit | Attending: Internal Medicine | Admitting: Internal Medicine

## 2017-02-01 ENCOUNTER — Telehealth: Payer: Self-pay | Admitting: *Deleted

## 2017-02-01 VITALS — BP 128/74 | HR 67 | Resp 16 | Ht 68.0 in | Wt 339.0 lb

## 2017-02-01 DIAGNOSIS — E662 Morbid (severe) obesity with alveolar hypoventilation: Secondary | ICD-10-CM | POA: Diagnosis present

## 2017-02-01 DIAGNOSIS — J4540 Moderate persistent asthma, uncomplicated: Secondary | ICD-10-CM | POA: Insufficient documentation

## 2017-02-01 LAB — BLOOD GAS, ARTERIAL
Acid-Base Excess: 5.6 mmol/L — ABNORMAL HIGH (ref 0.0–2.0)
Allens test (pass/fail): POSITIVE — AB
Bicarbonate: 32.1 mmol/L — ABNORMAL HIGH (ref 20.0–28.0)
FIO2: 21
O2 Saturation: 86 %
Patient temperature: 37
pCO2 arterial: 53 mmHg — ABNORMAL HIGH (ref 32.0–48.0)
pH, Arterial: 7.39 (ref 7.350–7.450)
pO2, Arterial: 52 mmHg — ABNORMAL LOW (ref 83.0–108.0)

## 2017-02-01 LAB — CBC WITH DIFFERENTIAL/PLATELET
Basophils Absolute: 0 10*3/uL (ref 0–0.1)
Basophils Relative: 0 %
Eosinophils Absolute: 0.1 10*3/uL (ref 0–0.7)
Eosinophils Relative: 1 %
HCT: 40.2 % (ref 40.0–52.0)
Hemoglobin: 13.4 g/dL (ref 13.0–18.0)
Lymphocytes Relative: 16 %
Lymphs Abs: 1 10*3/uL (ref 1.0–3.6)
MCH: 28.8 pg (ref 26.0–34.0)
MCHC: 33.3 g/dL (ref 32.0–36.0)
MCV: 86.4 fL (ref 80.0–100.0)
Monocytes Absolute: 0.4 10*3/uL (ref 0.2–1.0)
Monocytes Relative: 6 %
Neutro Abs: 4.9 10*3/uL (ref 1.4–6.5)
Neutrophils Relative %: 77 %
Platelets: 173 10*3/uL (ref 150–440)
RBC: 4.65 MIL/uL (ref 4.40–5.90)
RDW: 16.7 % — ABNORMAL HIGH (ref 11.5–14.5)
WBC: 6.5 10*3/uL (ref 3.8–10.6)

## 2017-02-01 NOTE — Telephone Encounter (Signed)
-----   Message from Jonathon CrutchPradeep Ramachandran, MD sent at 02/01/2017  1:32 PM EDT ----- Regarding: CPAP settings The patient should set his CPAP pressure at 16 cm H20. We may have to adjust this higher depending upon his response.

## 2017-02-01 NOTE — Telephone Encounter (Signed)
Patient has been made aware as stated in message below.

## 2017-02-01 NOTE — Patient Instructions (Signed)
--  Weight loss would be beneficial for your breathing and sleep apnea.   --Will start your CPAP pressure once we get the result from your sleep study.   --Limit your naps during the day.

## 2017-02-14 ENCOUNTER — Other Ambulatory Visit: Payer: Self-pay

## 2017-02-14 DIAGNOSIS — E119 Type 2 diabetes mellitus without complications: Secondary | ICD-10-CM

## 2017-02-14 DIAGNOSIS — Z7689 Persons encountering health services in other specified circumstances: Secondary | ICD-10-CM

## 2017-02-14 MED ORDER — METFORMIN HCL 1000 MG PO TABS: 1000.0000 mg | ORAL_TABLET | Freq: Two times a day (BID) | ORAL | 1 refills | Status: DC

## 2017-02-14 NOTE — Telephone Encounter (Signed)
Pt request refill metformin to [facility] [address]. Refilled per protocol and pt voiced understanding.

## 2017-02-28 ENCOUNTER — Telehealth: Payer: Self-pay | Admitting: Internal Medicine

## 2017-02-28 NOTE — Telephone Encounter (Signed)
Best number [phone] Spouse came in wanting to get lab results  She stated pt does not go on my chart

## 2017-03-02 NOTE — Telephone Encounter (Signed)
Left message on voicemail.

## 2017-03-14 DIAGNOSIS — E662 Morbid (severe) obesity with alveolar hypoventilation: Secondary | ICD-10-CM | POA: Insufficient documentation

## 2017-03-15 ENCOUNTER — Other Ambulatory Visit: Payer: Self-pay

## 2017-03-15 ENCOUNTER — Emergency Department: Payer: BC Managed Care – PPO

## 2017-03-15 ENCOUNTER — Telehealth: Payer: Self-pay | Admitting: Internal Medicine

## 2017-03-15 ENCOUNTER — Inpatient Hospital Stay
Admission: EM | Admit: 2017-03-15 | Discharge: 2017-03-17 | DRG: 190 | Disposition: A | Discharge status: Home / Self Care | Payer: BC Managed Care – PPO | Attending: Internal Medicine | Admitting: Internal Medicine

## 2017-03-15 ENCOUNTER — Encounter: Payer: Self-pay | Admitting: Emergency Medicine

## 2017-03-15 DIAGNOSIS — J441 Chronic obstructive pulmonary disease with (acute) exacerbation: Secondary | ICD-10-CM | POA: Diagnosis present

## 2017-03-15 DIAGNOSIS — Z6841 Body Mass Index (BMI) 40.0 and over, adult: Secondary | ICD-10-CM | POA: Diagnosis not present

## 2017-03-15 DIAGNOSIS — Z7982 Long term (current) use of aspirin: Secondary | ICD-10-CM

## 2017-03-15 DIAGNOSIS — Z23 Encounter for immunization: Secondary | ICD-10-CM

## 2017-03-15 DIAGNOSIS — Z7984 Long term (current) use of oral hypoglycemic drugs: Secondary | ICD-10-CM | POA: Diagnosis not present

## 2017-03-15 DIAGNOSIS — Z7951 Long term (current) use of inhaled steroids: Secondary | ICD-10-CM

## 2017-03-15 DIAGNOSIS — E1142 Type 2 diabetes mellitus with diabetic polyneuropathy: Secondary | ICD-10-CM | POA: Diagnosis present

## 2017-03-15 DIAGNOSIS — I10 Essential (primary) hypertension: Secondary | ICD-10-CM | POA: Diagnosis present

## 2017-03-15 DIAGNOSIS — J9601 Acute respiratory failure with hypoxia: Secondary | ICD-10-CM | POA: Diagnosis present

## 2017-03-15 DIAGNOSIS — R0902 Hypoxemia: Secondary | ICD-10-CM

## 2017-03-15 DIAGNOSIS — F17210 Nicotine dependence, cigarettes, uncomplicated: Secondary | ICD-10-CM | POA: Diagnosis present

## 2017-03-15 DIAGNOSIS — Z880 Allergy status to penicillin: Secondary | ICD-10-CM

## 2017-03-15 DIAGNOSIS — E785 Hyperlipidemia, unspecified: Secondary | ICD-10-CM | POA: Diagnosis present

## 2017-03-15 DIAGNOSIS — Z833 Family history of diabetes mellitus: Secondary | ICD-10-CM | POA: Diagnosis not present

## 2017-03-15 DIAGNOSIS — M797 Fibromyalgia: Secondary | ICD-10-CM | POA: Diagnosis present

## 2017-03-15 DIAGNOSIS — Z79899 Other long term (current) drug therapy: Secondary | ICD-10-CM | POA: Diagnosis not present

## 2017-03-15 DIAGNOSIS — E119 Type 2 diabetes mellitus without complications: Secondary | ICD-10-CM | POA: Diagnosis present

## 2017-03-15 LAB — CBC
HCT: 43.5 % (ref 40.0–52.0)
Hemoglobin: 14.1 g/dL (ref 13.0–18.0)
MCH: 28.7 pg (ref 26.0–34.0)
MCHC: 32.5 g/dL (ref 32.0–36.0)
MCV: 88.3 fL (ref 80.0–100.0)
Platelets: 185 10*3/uL (ref 150–440)
RBC: 4.92 MIL/uL (ref 4.40–5.90)
RDW: 16.2 % — ABNORMAL HIGH (ref 11.5–14.5)
WBC: 6.8 10*3/uL (ref 3.8–10.6)

## 2017-03-15 LAB — BASIC METABOLIC PANEL
Anion gap: 12 (ref 5–15)
BUN: 16 mg/dL (ref 6–20)
CO2: 33 mmol/L — ABNORMAL HIGH (ref 22–32)
Calcium: 9.1 mg/dL (ref 8.9–10.3)
Chloride: 95 mmol/L — ABNORMAL LOW (ref 101–111)
Creatinine, Ser: 0.72 mg/dL (ref 0.61–1.24)
GFR calc Af Amer: >60 mL/min (ref >60)
GFR calc non Af Amer: >60 mL/min (ref >60)
Glucose, Bld: 218 mg/dL — ABNORMAL HIGH (ref 65–99)
Potassium: 4 mmol/L (ref 3.5–5.1)
Sodium: 140 mmol/L (ref 135–145)

## 2017-03-15 LAB — BRAIN NATRIURETIC PEPTIDE: B Natriuretic Peptide: 200 pg/mL — ABNORMAL HIGH (ref 0.0–100.0)

## 2017-03-15 LAB — MAGNESIUM: Magnesium: 1.7 mg/dL (ref 1.7–2.4)

## 2017-03-15 LAB — TROPONIN I: Troponin I: 0.03 ng/mL (ref <0.03)

## 2017-03-15 LAB — GLUCOSE, CAPILLARY
Glucose-Capillary: 133 mg/dL — ABNORMAL HIGH (ref 65–99)
Glucose-Capillary: 312 mg/dL — ABNORMAL HIGH (ref 65–99)

## 2017-03-15 MED ORDER — IPRATROPIUM-ALBUTEROL 0.5-2.5 (3) MG/3ML IN SOLN
3.0000 mL | Freq: Once | RESPIRATORY_TRACT | Status: AC
  Administered 2017-03-15: 3 mL via RESPIRATORY_TRACT

## 2017-03-15 MED ORDER — GUAIFENESIN-DM 100-10 MG/5ML PO SYRP: 5.0000 mL | ORAL_SOLUTION | ORAL | Status: DC | PRN

## 2017-03-15 MED ORDER — KETOROLAC TROMETHAMINE 30 MG/ML IJ SOLN: 30.0000 mg | Freq: Four times a day (QID) | INTRAMUSCULAR | Status: DC | PRN

## 2017-03-15 MED ORDER — GABAPENTIN 300 MG PO CAPS
300.0000 mg | ORAL_CAPSULE | Freq: Two times a day (BID) | ORAL | Status: DC
  Administered 2017-03-15 – 2017-03-17 (×4): 300 mg via ORAL
  Filled 2017-03-15 (×4): qty 1

## 2017-03-15 MED ORDER — ATORVASTATIN CALCIUM 20 MG PO TABS
20.0000 mg | ORAL_TABLET | Freq: Every day | ORAL | Status: DC
  Administered 2017-03-16 – 2017-03-17 (×2): 20 mg via ORAL
  Filled 2017-03-15 (×2): qty 1

## 2017-03-15 MED ORDER — SODIUM CHLORIDE 0.9% FLUSH: 3.0000 mL | INTRAVENOUS | Status: DC | PRN

## 2017-03-15 MED ORDER — ONDANSETRON HCL 4 MG PO TABS: 4.0000 mg | ORAL_TABLET | Freq: Four times a day (QID) | ORAL | Status: DC | PRN

## 2017-03-15 MED ORDER — PNEUMOCOCCAL VAC POLYVALENT 25 MCG/0.5ML IJ INJ
0.5000 mL | INJECTION | INTRAMUSCULAR | Status: AC
  Administered 2017-03-17: 0.5 mL via INTRAMUSCULAR
  Filled 2017-03-15: qty 0.5

## 2017-03-15 MED ORDER — FLUTICASONE FUROATE-VILANTEROL 200-25 MCG/INH IN AEPB
1.0000 | INHALATION_SPRAY | Freq: Every evening | RESPIRATORY_TRACT | Status: DC
  Administered 2017-03-15 – 2017-03-16 (×2): 1 via RESPIRATORY_TRACT
  Filled 2017-03-15: qty 28

## 2017-03-15 MED ORDER — ALBUTEROL SULFATE (2.5 MG/3ML) 0.083% IN NEBU: 2.5000 mg | INHALATION_SOLUTION | RESPIRATORY_TRACT | Status: DC | PRN

## 2017-03-15 MED ORDER — ALBUTEROL SULFATE (2.5 MG/3ML) 0.083% IN NEBU: 2.5000 mg | INHALATION_SOLUTION | Freq: Four times a day (QID) | RESPIRATORY_TRACT | Status: DC | PRN

## 2017-03-15 MED ORDER — IPRATROPIUM-ALBUTEROL 0.5-2.5 (3) MG/3ML IN SOLN
3.0000 mL | Freq: Four times a day (QID) | RESPIRATORY_TRACT | Status: DC
  Administered 2017-03-15 – 2017-03-17 (×6): 3 mL via RESPIRATORY_TRACT
  Filled 2017-03-15 (×7): qty 3

## 2017-03-15 MED ORDER — FUROSEMIDE 40 MG PO TABS
40.0000 mg | ORAL_TABLET | Freq: Every day | ORAL | Status: DC
  Administered 2017-03-16 – 2017-03-17 (×2): 40 mg via ORAL
  Filled 2017-03-15 (×2): qty 1

## 2017-03-15 MED ORDER — INSULIN ASPART 100 UNIT/ML ~~LOC~~ SOLN
0.0000 [IU] | Freq: Three times a day (TID) | SUBCUTANEOUS | Status: DC
  Administered 2017-03-15: 1 [IU] via SUBCUTANEOUS
  Administered 2017-03-16: 5 [IU] via SUBCUTANEOUS
  Administered 2017-03-16 (×2): 3 [IU] via SUBCUTANEOUS
  Administered 2017-03-17: 5 [IU] via SUBCUTANEOUS
  Filled 2017-03-15 (×5): qty 1

## 2017-03-15 MED ORDER — IPRATROPIUM-ALBUTEROL 0.5-2.5 (3) MG/3ML IN SOLN
3.0000 mL | Freq: Once | RESPIRATORY_TRACT | Status: AC
  Administered 2017-03-15: 3 mL via RESPIRATORY_TRACT
  Filled 2017-03-15: qty 6

## 2017-03-15 MED ORDER — SENNOSIDES-DOCUSATE SODIUM 8.6-50 MG PO TABS: 1.0000 | ORAL_TABLET | Freq: Every evening | ORAL | Status: DC | PRN

## 2017-03-15 MED ORDER — INSULIN ASPART 100 UNIT/ML ~~LOC~~ SOLN
0.0000 [IU] | Freq: Every day | SUBCUTANEOUS | Status: DC
  Administered 2017-03-15 – 2017-03-16 (×2): 4 [IU] via SUBCUTANEOUS
  Filled 2017-03-15 (×2): qty 1

## 2017-03-15 MED ORDER — ACETAMINOPHEN 650 MG RE SUPP
650.0000 mg | Freq: Four times a day (QID) | RECTAL | Status: DC | PRN
Start: 2017-03-15 — End: 2017-03-17

## 2017-03-15 MED ORDER — METHYLPREDNISOLONE SODIUM SUCC 125 MG IJ SOLR
125.0000 mg | Freq: Once | INTRAMUSCULAR | Status: AC
  Administered 2017-03-15: 125 mg via INTRAVENOUS
  Filled 2017-03-15: qty 2

## 2017-03-15 MED ORDER — ONDANSETRON HCL 4 MG/2ML IJ SOLN: 4.0000 mg | Freq: Four times a day (QID) | INTRAMUSCULAR | Status: DC | PRN

## 2017-03-15 MED ORDER — IBUPROFEN 800 MG PO TABS
800.0000 mg | ORAL_TABLET | Freq: Once | ORAL | Status: AC
  Administered 2017-03-15: 800 mg via ORAL
  Filled 2017-03-15: qty 1

## 2017-03-15 MED ORDER — SODIUM CHLORIDE 0.9 % IV SOLN: 250.0000 mL | INTRAVENOUS | Status: DC | PRN

## 2017-03-15 MED ORDER — HYDROCODONE-ACETAMINOPHEN 5-325 MG PO TABS
1.0000 | ORAL_TABLET | ORAL | Status: DC | PRN
  Filled 2017-03-15: qty 1

## 2017-03-15 MED ORDER — METHYLPREDNISOLONE SODIUM SUCC 125 MG IJ SOLR
60.0000 mg | Freq: Four times a day (QID) | INTRAMUSCULAR | Status: DC
  Administered 2017-03-15 – 2017-03-17 (×6): 60 mg via INTRAVENOUS
  Filled 2017-03-15 (×6): qty 2

## 2017-03-15 MED ORDER — ACETAMINOPHEN 325 MG PO TABS: 650.0000 mg | ORAL_TABLET | Freq: Four times a day (QID) | ORAL | Status: DC | PRN

## 2017-03-15 MED ORDER — SODIUM CHLORIDE 0.9% FLUSH
3.0000 mL | Freq: Two times a day (BID) | INTRAVENOUS | Status: DC
  Administered 2017-03-15 – 2017-03-16 (×3): 3 mL via INTRAVENOUS

## 2017-03-15 MED ORDER — ENOXAPARIN SODIUM 40 MG/0.4ML ~~LOC~~ SOLN
40.0000 mg | Freq: Two times a day (BID) | SUBCUTANEOUS | Status: DC
  Administered 2017-03-15: 40 mg via SUBCUTANEOUS
  Filled 2017-03-15 (×3): qty 0.4

## 2017-03-15 MED ORDER — ASPIRIN EC 81 MG PO TBEC
81.0000 mg | DELAYED_RELEASE_TABLET | Freq: Every day | ORAL | Status: DC
  Administered 2017-03-16: 81 mg via ORAL
  Filled 2017-03-15 (×3): qty 1

## 2017-03-15 MED ORDER — ENOXAPARIN SODIUM 30 MG/0.3ML ~~LOC~~ SOLN: 30.0000 mg | SUBCUTANEOUS | Status: DC

## 2017-03-15 MED ORDER — HYDROCHLOROTHIAZIDE 12.5 MG PO CAPS
12.5000 mg | ORAL_CAPSULE | Freq: Every day | ORAL | Status: DC
  Administered 2017-03-16 – 2017-03-17 (×2): 12.5 mg via ORAL
  Filled 2017-03-15 (×2): qty 1

## 2017-03-15 MED ORDER — BISACODYL 5 MG PO TBEC: 5.0000 mg | DELAYED_RELEASE_TABLET | Freq: Every day | ORAL | Status: DC | PRN

## 2017-03-15 NOTE — ED Notes (Addendum)
Pt c/o worsening SOB over last few days, lower back pain. Pt oxygen low at time of triage per report. Pt does not wear oxygen at baseline. RR labored with exertion and talking. Color WNL. Bilateral leg swelling which patient states has increased over last few days above his baseline. Denies CP.

## 2017-03-15 NOTE — ED Notes (Signed)
Report to Jeanna, RN

## 2017-03-15 NOTE — Telephone Encounter (Signed)
Per chart review tab pt went to [facility] ED. 

## 2017-03-15 NOTE — ED Notes (Signed)
Patient transported to X-ray 

## 2017-03-15 NOTE — ED Notes (Signed)
Pt transported to room 230. 

## 2017-03-15 NOTE — ED Notes (Signed)
Pt placed 4L [place].

## 2017-03-15 NOTE — H&P (Signed)
[facility] [place] [facility]   PATIENT NAME: Jonathon Graham    MR#:  [id number]  DATE OF BIRTH:  1956-03-05  DATE OF ADMISSION:  03/15/2017  PRIMARY CARE PHYSICIAN: Lorre Munroe, NP   REQUESTING/REFERRING PHYSICIAN: Governor Rooks, MD  CHIEF COMPLAINT:   Chief Complaint  Patient presents with  . Shortness of Breath   shortness breath over the week, worsening today. HISTORY OF PRESENT ILLNESS:  Jonathon Graham  is a 61 y.o. male with a known history of COPD, hypertension, diabetes, hyperlipidemia, BPH and obesity.the patient presently ED with the above chief complaints. He has had the shortness breath and wheezing for the past one week, worsening since this morning. He denies any fever or chills, no orthopnea or nocturnal dyspnea but has leg edema. He was found hypoxia at 83% in room air, put on oxygen by nasal cannular 4 L. He is treated with nebulizer and IV Solu-Medrol in the ED.  PAST MEDICAL HISTORY:   Past Medical History:  Diagnosis Date  . Arthritis   . Benign prostatic hyperplasia (BPH) with urinary urgency    nodular  . COPD (chronic obstructive pulmonary disease) (HCC)   . Depression   . Diabetes mellitus without complication (HCC)   . Essential hypertension   . Fibromyalgia, primary   . Hyperlipidemia   . Hypertension   . Obesity     PAST SURGICAL HISTORY:  History reviewed. No pertinent surgical history.  SOCIAL HISTORY:   Social History  Substance Use Topics  . Smoking status: Current Some Day Smoker    Types: Cigarettes    Last attempt to quit: 04/04/2015  . Smokeless tobacco: Never Used     Comment: 3-4 daily  . Alcohol use No    FAMILY HISTORY:   Family History  Problem Relation Age of Onset  . Heart disease Father   . Diabetes Mellitus II Sister   . Myasthenia gravis Sister   . Fibrocystic breast disease Sister   . Hypertension Sister   . Diabetes Mellitus II Brother     DRUG ALLERGIES:   Allergies    Allergen Reactions  . Penicillins Anaphylaxis and Swelling    REVIEW OF SYSTEMS:   Review of Systems  Constitutional: Negative for chills, fever and malaise/fatigue.  HENT: Negative for sore throat.   Eyes: Negative for blurred vision and double vision.  Respiratory: Positive for cough, shortness of breath and wheezing. Negative for hemoptysis, sputum production and stridor.   Cardiovascular: Negative for chest pain, palpitations, orthopnea and leg swelling.  Gastrointestinal: Negative for abdominal pain, blood in stool, diarrhea, melena, nausea and vomiting.  Genitourinary: Negative for dysuria, flank pain and hematuria.  Musculoskeletal: Negative for back pain and joint pain.  Skin: Negative for rash.  Neurological: Negative for dizziness, sensory change, focal weakness, seizures, loss of consciousness, weakness and headaches.  Endo/Heme/Allergies: Negative for polydipsia.  Psychiatric/Behavioral: Negative for depression. The patient is not nervous/anxious.     MEDICATIONS AT HOME:   Prior to Admission medications   Medication Sig Start Date End Date Taking? Authorizing Provider  albuterol (PROVENTIL) (2.5 MG/3ML) 0.083% nebulizer solution Take 3 mLs (2.5 mg total) by nebulization every 6 (six) hours as needed for wheezing or shortness of breath. 06/29/16  Yes Lorre Munroe, NP  aspirin 81 MG tablet Take 81 mg by mouth daily. Takes 4 81mg  aspirin daily.   Yes [provider]  atorvastatin (LIPITOR) 20 MG tablet Take 1 tablet by mouth daily. 03/09/17  Yes [provider]  fluticasone furoate-vilanterol (BREO ELLIPTA) 200-25 MCG/INH AEPB Inhale 1 puff into the lungs daily. 12/11/16  Yes Eustaquio Boyden, MD  furosemide (LASIX) 40 MG tablet Take 1 tablet (40 mg total) by mouth daily. 10/31/16  Yes Lorre Munroe, NP  gabapentin (NEURONTIN) 300 MG capsule Take 1 capsule (300 mg total) by mouth 2 (two) times daily. 11/28/16  Yes Eustaquio Boyden, MD  hydrochlorothiazide  (MICROZIDE) 12.5 MG capsule Take 1 capsule by mouth daily. 03/09/17  Yes [provider]  metFORMIN (GLUCOPHAGE) 1000 MG tablet Take 1 tablet (1,000 mg total) by mouth 2 (two) times daily with a meal. 02/14/17  Yes Baity, Salvadore Oxford, NP  acetaminophen (TYLENOL) 500 MG tablet Take 1 tablet (500 mg total) by mouth every 8 (eight) hours as needed. 01/05/16   Loura Pardon, NP  diclofenac sodium (VOLTAREN) 1 % GEL Apply 2 g topically 4 (four) times daily. Patient not taking: Reported on 03/15/2017 01/05/16   Loura Pardon, NP  Elastic Bandages & Supports (MEDICAL COMPRESSION SOCKS) MISC 1 each by Does not apply route daily. 01/05/16   Loura Pardon, NP      VITAL SIGNS:  Blood pressure 131/77, pulse 65, resp. rate (!) 26, height 5\' 8"  (1.727 m), weight (!) 323 lb (146.5 kg), SpO2 96 %.  PHYSICAL EXAMINATION:  Physical Exam  GENERAL:  61 y.o.-year-old patient lying in the bed with no acute distress. Morbid obesity. EYES: Pupils equal, round, reactive to light and accommodation. No scleral icterus. Extraocular muscles intact.  HEENT: Head atraumatic, normocephalic. Oropharynx and nasopharynx clear.  NECK:  Supple, no jugular venous distention. No thyroid enlargement, no tenderness.  LUNGS: diminished breath sounds bilaterally, expiratory wheezing, no rales,rhonchi or crepitation. No use of accessory muscles of respiration.  CARDIOVASCULAR: S1, S2 normal. No murmurs, rubs, or gallops.  ABDOMEN: Soft, nontender, nondistended. Bowel sounds present. No organomegaly or mass.  EXTREMITIES: No cyanosis, or clubbing. Trace leg edema. NEUROLOGIC: Cranial nerves II through XII are intact. Muscle strength 5/5 in all extremities. Sensation intact. Gait not checked.  PSYCHIATRIC: The patient is alert and oriented x 3.  SKIN: No obvious rash, lesion, or ulcer.   LABORATORY PANEL:   CBC  Recent Labs Lab 03/15/17 1210  WBC 6.8  HGB 14.1  HCT 43.5  PLT 185    ------------------------------------------------------------------------------------------------------------------  Chemistries   Recent Labs Lab 03/15/17 1210  NA 140  K 4.0  CL 95*  CO2 33*  GLUCOSE 218*  BUN 16  CREATININE 0.72  CALCIUM 9.1   ------------------------------------------------------------------------------------------------------------------  Cardiac Enzymes  Recent Labs Lab 03/15/17 1210  TROPONINI 0.03*   ------------------------------------------------------------------------------------------------------------------  RADIOLOGY:  Dg Chest 2 View  Result Date: 03/15/2017 CLINICAL DATA:  Shortness of breath. EXAM: CHEST  2 VIEW COMPARISON:  CT 11/19/2016. FINDINGS: Cardiomegaly with normal pulmonary vascularity. Mild basilar atelectasis. No pleural effusion or pneumothorax. No acute bony abnormality . IMPRESSION: 1. Cardiomegaly.  No pulmonary venous congestion . 2.  Mild basilar atelectasis . Electronically Signed   By: Maisie Fus  Register   On: 03/15/2017 12:17      IMPRESSION AND PLAN:   Acute respiratory failure with hypoxia due to COPD exacerbation. The patient will be admitted to medical floor. Start duoNeb every 6 hours, IV Solu-Medrol, nebulizer. Continue oxygen by nasal cannular. Hypertension. Continue home hypertension medication. Diabetes. Start sliding scale. Morbid obesity. Tobacco abuse. Smoking cessation was counseled for 3-4 minutes. Nicotine patch.  All the records are reviewed and case discussed with ED  provider. Management plans discussed with the patient, his wife vand they are in agreement.  CODE STATUS: Full code  TOTAL TIME TAKING CARE OF THIS PATIENT: 58 minutes.    Shaune Pollackhen, Shaundrea Carrigg M.D on 03/15/2017 at 2:30 PM  Between 7am to 6pm - Pager - [phone]  After 6pm go to [email or web address] - Social research officer, governmentpassword EPAS [facility]  [facility]  Office  [phone]  CC: Primary care physician; Lorre MunroeBaity, Regina W,  NP   Note: This dictation was prepared with Dragon dictation along with smaller phrase technology. Any transcriptional errors that result from this process are unin

## 2017-03-15 NOTE — ED Notes (Signed)
Admitting MD at bedside.

## 2017-03-15 NOTE — Progress Notes (Signed)
PHARMACIST - PHYSICIAN COMMUNICATION  CONCERNING:  Enoxaparin (Lovenox) for DVT Prophylaxis    RECOMMENDATION: Patient was prescribed enoxaprin 30mg  q24 hours for VTE prophylaxis.   Filed Weights   03/15/17 1126  Weight: (!) 323 lb (146.5 kg)    Body mass index is 49.11 kg/m.  Estimated Creatinine Clearance: 141.8 mL/min (by C-G formula based on SCr of 0.72 mg/dL).   [facility] patient is candidate for enoxaparin 40mg  every 12 hour dosing due to BMI being >40.  DESCRIPTION: Pharmacy has adjusted enoxaparin dose per [facility] policy, approved through P & T committee.  Patient is now receiving enoxaparin 40mg  every 12 hours.   Cher NakaiSheema Shaely Gadberry, PharmD, BCPS Clinical Pharmacist  03/15/2017 4:06 PM

## 2017-03-15 NOTE — ED Notes (Signed)
Attempt for IV X 2 unsuccessful by this RN.

## 2017-03-15 NOTE — ED Provider Notes (Signed)
[facility] Emergency Department Provider Note ____________________________________________   I have reviewed the triage vital signs and the triage nursing note.  HISTORY  Chief Complaint Shortness of Breath   Historian Patient  And spouse -- both are fairly vague about his actual past medical history  HPI Jonathon Graham is a 61 y.o. male Presents from home complaining of shortness of breath. Sounds like this may been getting worse over the matter of weeks, but certainly worse this morning when he got up to go to work this morning in the early morning hours and was dyspneic and lightheaded. Patient found to be hypoxic in triage. Next the next line patient states he was admitted to the hospital with hypoxia several months ago, but he is not quite sure why.  Told he does not have a history of COPD or emphysema or asthma, but then states he does use an inhaler.  Uses CPAP, but no home oxygen.  Denies any chest pain. He is having moderate to severe shortness of breath which is worse with walking as well as worse with lying flat. Next line he does have lower extremity edema that he is not sure whether or not it's worse or different than usual, equal bilaterally.  No pleuritic chest pain.  He's been coughing up what he calls fluid, no phlegm.    Past Medical History:  Diagnosis Date  . Arthritis   . Benign prostatic hyperplasia (BPH) with urinary urgency    nodular  . COPD (chronic obstructive pulmonary disease) (HCC)   . Depression   . Diabetes mellitus without complication (HCC)   . Essential hypertension   . Fibromyalgia, primary   . Hyperlipidemia   . Hypertension   . Obesity     Patient Active Problem List   Diagnosis Date Noted  . Right shoulder pain 11/29/2016  . Arthritis 03/14/2016  . BPH (benign prostatic hyperplasia) 03/14/2016  . Depression 03/14/2016  . HTN (hypertension) 03/14/2016  . Fibromyalgia 03/14/2016  . HLD (hyperlipidemia)  03/14/2016  . Insomnia 03/14/2016  . COPD (chronic obstructive pulmonary disease) (HCC) 11/26/2015  . Severe obstructive sleep apnea 06/15/2015  . Tobacco use 06/15/2015  . BMI 50.0-59.9, adult (HCC) 05/26/2015  . Type 2 diabetes, controlled, with peripheral neuropathy (HCC) 05/04/2015    History reviewed. No pertinent surgical history.  Prior to Admission medications   Medication Sig Start Date End Date Taking? Authorizing Provider  albuterol (PROVENTIL) (2.5 MG/3ML) 0.083% nebulizer solution Take 3 mLs (2.5 mg total) by nebulization every 6 (six) hours as needed for wheezing or shortness of breath. 06/29/16  Yes Lorre Munroe, NP  aspirin 81 MG tablet Take 81 mg by mouth daily. Takes 4 81mg  aspirin daily.   Yes [provider]  fluticasone furoate-vilanterol (BREO ELLIPTA) 200-25 MCG/INH AEPB Inhale 1 puff into the lungs daily. 12/11/16  Yes Eustaquio Boyden, MD  furosemide (LASIX) 40 MG tablet Take 1 tablet (40 mg total) by mouth daily. 10/31/16  Yes Lorre Munroe, NP  gabapentin (NEURONTIN) 300 MG capsule Take 1 capsule (300 mg total) by mouth 2 (two) times daily. 11/28/16  Yes Eustaquio Boyden, MD  metFORMIN (GLUCOPHAGE) 1000 MG tablet Take 1 tablet (1,000 mg total) by mouth 2 (two) times daily with a meal. 02/14/17  Yes Baity, Salvadore Oxford, NP  acetaminophen (TYLENOL) 500 MG tablet Take 1 tablet (500 mg total) by mouth every 8 (eight) hours as needed. 01/05/16   Loura Pardon, NP  diclofenac sodium (VOLTAREN) 1 % GEL  Apply 2 g topically 4 (four) times daily. Patient not taking: Reported on 03/15/2017 01/05/16   Loura Pardon, NP  Elastic Bandages & Supports (MEDICAL COMPRESSION SOCKS) MISC 1 each by Does not apply route daily. 01/05/16   Loura Pardon, NP    Allergies  Allergen Reactions  . Penicillins Anaphylaxis and Swelling    Family History  Problem Relation Age of Onset  . Heart disease Father   . Diabetes Mellitus II Sister   . Myasthenia gravis Sister   .  Fibrocystic breast disease Sister   . Hypertension Sister   . Diabetes Mellitus II Brother     Social History Social History  Substance Use Topics  . Smoking status: Current Some Day Smoker    Types: Cigarettes    Last attempt to quit: 04/04/2015  . Smokeless tobacco: Never Used     Comment: 3-4 daily  . Alcohol use No    Review of Systems  Constitutional: Negative for fever. Eyes: Negative for visual changes. ENT: Negative for sore throat. Cardiovascular: Negative for chest pain. Respiratory: Positive for shortness of breath. Gastrointestinal: Negative for abdominal pain, vomiting and diarrhea. Genitourinary: Negative for dysuria. Musculoskeletal: Negative for back pain. Skin: Negative for rash. Neurological: Negative for headache.  ____________________________________________   PHYSICAL EXAM:  VITAL SIGNS: ED Triage Vitals  Enc Vitals Group     BP --      Pulse --      Resp --      Temp --      Temp src --      SpO2 --      Weight 03/15/17 1126 (!) 323 lb (146.5 kg)     Height 03/15/17 1126 5\' 8"  (1.727 m)     Head Circumference --      Peak Flow --      Pain Score 03/15/17 1125 7     Pain Loc --      Pain Edu? --      Excl. in GC? --      Constitutional: Alert and oriented. Well appearing and in no distress at rest, but requiring oxygen. HEENT   Head: Normocephalic and atraumatic.      Eyes: Conjunctivae are normal. Pupils equal and round.       Ears:         Nose: No congestion/rhinnorhea.   Mouth/Throat: Mucous membranes are moist.   Neck: No stridor. Cardiovascular/Chest: Normal rate, regular rhythm.  No murmurs, rubs, or gallops. Respiratory: Decreased air movement at the bases.  No ronchi.  No wheezing.  No rales appreciated. Gastrointestinal: Soft. No distention, no guarding, no rebound. Nontender.  Obese  Genitourinary/rectal:Deferred Musculoskeletal: Nontender with normal range of motion in all extremities. No joint effusions.  No  lower extremity tenderness.  3+ ble equally. Neurologic:  Normal speech and language. No gross or focal neurologic deficits are appreciated. Skin:  Skin is warm, dry and intact. No rash noted. Psychiatric: Mood and affect are normal. Speech and behavior are normal. Patient exhibits appropriate insight and judgment.   ____________________________________________  LABS (pertinent positives/negatives) I, Governor Rooks, MD the attending physician have reviewed the labs noted below.  Labs Reviewed  BASIC METABOLIC PANEL - Abnormal; Notable for the following:       Result Value   Chloride 95 (*)    CO2 33 (*)    Glucose, Bld 218 (*)    All other components within normal limits  CBC - Abnormal; Notable for the following:  RDW 16.2 (*)    All other components within normal limits  TROPONIN I - Abnormal; Notable for the following:    Troponin I 0.03 (*)    All other components within normal limits  BRAIN NATRIURETIC PEPTIDE - Abnormal; Notable for the following:    B Natriuretic Peptide 200.0 (*)    All other components within normal limits    ____________________________________________    EKG I, Governor Rooks, MD, the attending physician have personally viewed and interpreted all ECGs.  75 bpm. Normal sinus rhythm. Right bundle branch block and left anterior fascicular block. Nonspecific ST and T-wave. ____________________________________________  RADIOLOGY All Xrays were viewed by me.  Imaging interpreted by Radiologist, and I, Governor Rooks, MD the attending physician have reviewed the radiologist interpretation noted below.  chest x-ray portable:  IMPRESSION: 1. Cardiomegaly.  No pulmonary venous congestion .  2.  Mild basilar atelectasis . __________________________________________  PROCEDURES  Procedure(s) performed: None  Critical Care performed: None  ____________________________________________  Current Facility-Administered Medications on File Prior to  Encounter  Medication Dose Route Frequency Provider Last Rate Last Dose  . ipratropium-albuterol (DUONEB) 0.5-2.5 (3) MG/3ML nebulizer solution 3 mL  3 mL Nebulization Q6H Krebs, Amy Lauren, NP       Current Outpatient Prescriptions on File Prior to Encounter  Medication Sig Dispense Refill  . albuterol (PROVENTIL) (2.5 MG/3ML) 0.083% nebulizer solution Take 3 mLs (2.5 mg total) by nebulization every 6 (six) hours as needed for wheezing or shortness of breath. 150 mL 6  . aspirin 81 MG tablet Take 81 mg by mouth daily. Takes 4 81mg  aspirin daily.    . fluticasone furoate-vilanterol (BREO ELLIPTA) 200-25 MCG/INH AEPB Inhale 1 puff into the lungs daily. 30 each 1  . furosemide (LASIX) 40 MG tablet Take 1 tablet (40 mg total) by mouth daily. 30 tablet 3  . gabapentin (NEURONTIN) 300 MG capsule Take 1 capsule (300 mg total) by mouth 2 (two) times daily. 180 capsule 3  . metFORMIN (GLUCOPHAGE) 1000 MG tablet Take 1 tablet (1,000 mg total) by mouth 2 (two) times daily with a meal. 180 tablet 1  . acetaminophen (TYLENOL) 500 MG tablet Take 1 tablet (500 mg total) by mouth every 8 (eight) hours as needed. 30 tablet 0  . diclofenac sodium (VOLTAREN) 1 % GEL Apply 2 g topically 4 (four) times daily. (Patient not taking: Reported on 03/15/2017) 100 g 2  . Elastic Bandages & Supports (MEDICAL COMPRESSION SOCKS) MISC 1 each by Does not apply route daily. 1 each 0    ____________________________________________  ED COURSE / ASSESSMENT AND PLAN  Pertinent labs & imaging results that were available during my care of the patient were reviewed by me and considered in my medical decision making (see chart for details).   patient hypoxic on room air to the low 80s, on 2 L nasal cannula 93%.  Patient and wife do not know why he was diagnosed with hypoxia and admitted several months ago, but upon chart review was able to telemetry was admitted for COPD exacerbation. He does not really sound like he is wheezing  here and clinically looks more like volume overload pulmonary edema situation.  Given chest x-ray clear for pulmonary edema and BNP also reassuring, feel patient's clinical picture may be more consistent with COPD exacerbation similar to previous hospitals relation. Patient was given Solu-Medrol as well as to treatment with some minimal improvement here, but still hypoxic.  Plan for hospital treatment for hypoxia associated with COPD exacerbation.  I reviewed patient had felt similarly to the episode when he was admitted to the hospital in June. I reviewed during that evaluation, patient did have a CT scan for PE the day before he was actually admitted which was negative. Given this, and he doesn't have other symptoms of PE in terms of no pleuritic pain, and similar presentation last time which was copd exacerbation, I don't have a high suspicion for PE and I'm not going to do CT scan today.   DIFFERENTIAL DIAGNOSIS: Differential includes, but is not limited to, viral syndrome, bronchitis including COPD exacerbation, pneumonia, reactive airway disease including asthma, CHF including exacerbation with or without pulmonary/interstitial edema, pneumothorax, ACS, thoracic trauma, and pulmonary embolism.  CONSULTATIONS:  hospitalist for admission.  Patient / Family / Caregiver informed of clinical course, medical decision-making process, and agree with plan.  ___________________________________________   FINAL CLINICAL IMPRESSION(S) / ED DIAGNOSES   Final diagnoses:  Hypoxia  COPD exacerbation (HCC)              Note: This dictation was prepared with Dragon dictation. Any transcriptional errors that result from this process are unintentional    Governor RooksLord, Erasmus Bistline, MD 03/15/17 1322

## 2017-03-15 NOTE — Telephone Encounter (Signed)
[facility] - Client TELEPHONE ADVICE RECORD [facility]  Patient Name: Jonathon Graham  DOB: May 25, 1956    Initial Comment shortness of breath, pain below the waist down into legs    Nurse Assessment  Nurse: Odis LusterBowers, RN, Bjorn Loserhonda Date/Time (Eastern Time): 03/15/2017 10:35:19 AM  Confirm and document reason for call. If symptomatic, describe symptoms. ---shortness of breath, pain below the waist down into legs. Left ankle is swollen. Symptoms began on Monday. Has been using inhaler for breathing.  Does the patient have any new or worsening symptoms? ---Yes  Will a triage be completed? ---Yes  Related visit to physician within the last 2 weeks? ---Yes  Does the PT have any chronic conditions? (i.e. diabetes, asthma, etc.) ---Yes  List chronic conditions. ---HTN; diabetes fibromyalgia; arthritis;  Is this a behavioral health or substance abuse call? ---No     Guidelines    Guideline Title Affirmed Question Affirmed Notes  Back Pain [1] SEVERE back pain (e.g., excruciating) AND [2] sudden onset AND [3] age > 5060    Final Disposition User   Go to ED Now Odis LusterBowers, RN, Rhonda    Referrals  [facility] - ED   Caller Disagree/Comply Comply  Caller Understands Yes  PreDisposition InappropriateToAsk

## 2017-03-15 NOTE — ED Triage Notes (Signed)
Pt c/o SHOB for 4 days. No cough or fevers.  Was in ICU last time per wife because O 2 was low. sats 87% RA in triage. Unlabored at this time.  Placed on 2 L [place] at this time.

## 2017-03-16 LAB — GLUCOSE, CAPILLARY
Glucose-Capillary: 244 mg/dL — ABNORMAL HIGH (ref 65–99)
Glucose-Capillary: 256 mg/dL — ABNORMAL HIGH (ref 65–99)
Glucose-Capillary: 221 mg/dL — ABNORMAL HIGH (ref 65–99)
Glucose-Capillary: 304 mg/dL — ABNORMAL HIGH (ref 65–99)

## 2017-03-16 NOTE — Progress Notes (Signed)
[employer] [facility] - [place] [facility]   PATIENT NAME: Jonathon Graham    MR#:  [id number]  DATE OF BIRTH:  [birth year]  SUBJECTIVE:  CHIEF COMPLAINT:   Chief Complaint  Patient presents with  . Shortness of Breath  Feels better, still hypoxic on walking.  REVIEW OF SYSTEMS:  CONSTITUTIONAL: No fever, fatigue or weakness.  EYES: No blurred or double vision.  EARS, NOSE, AND THROAT: No tinnitus or ear pain.  RESPIRATORY: No cough,positive for shortness of breath, wheezing or hemoptysis.  CARDIOVASCULAR: No chest pain, orthopnea, edema.  GASTROINTESTINAL: No nausea, vomiting, diarrhea or abdominal pain.  GENITOURINARY: No dysuria, hematuria.  ENDOCRINE: No polyuria, nocturia,  HEMATOLOGY: No anemia, easy bruising or bleeding SKIN: No rash or lesion. MUSCULOSKELETAL: No joint pain or arthritis.   NEUROLOGIC: No tingling, numbness, weakness.  PSYCHIATRY: No anxiety or depression.   ROS  DRUG ALLERGIES:   Allergies  Allergen Reactions  . Penicillins Anaphylaxis and Swelling    VITALS:  Blood pressure 124/82, pulse (!) 117, temperature 98.6 F (37 C), temperature source Oral, resp. rate 20, height 5\' 8"  (1.727 m), weight (!) 146.5 kg (323 lb), SpO2 91 %.  PHYSICAL EXAMINATION:  GENERAL:  61 y.o.-year-old patient lying in the bed with no acute distress.  EYES: Pupils equal, round, reactive to light and accommodation. No scleral icterus. Extraocular muscles intact.  HEENT: Head atraumatic, normocephalic. Oropharynx and nasopharynx clear.  NECK:  Supple, no jugular venous distention. No thyroid enlargement, no tenderness.  LUNGS: Normal breath sounds bilaterally, some wheezing, rales,rhonchi or crepitation. No use of accessory muscles of respiration.  CARDIOVASCULAR: S1, S2 normal. No murmurs, rubs, or gallops.  ABDOMEN: Soft, nontender, nondistended. Bowel sounds present. No organomegaly or mass.  EXTREMITIES: No pedal edema, cyanosis, or clubbing.  NEUROLOGIC:  Cranial nerves II through XII are intact. Muscle strength 5/5 in all extremities. Sensation intact. Gait not checked.  PSYCHIATRIC: The patient is alert and oriented x 3.  SKIN: No obvious rash, lesion, or ulcer.   Physical Exam LABORATORY PANEL:   CBC  Recent Labs Lab 03/15/17 1210  WBC 6.8  HGB 14.1  HCT 43.5  PLT 185   ------------------------------------------------------------------------------------------------------------------  Chemistries   Recent Labs Lab 03/15/17 1210  NA 140  K 4.0  CL 95*  CO2 33*  GLUCOSE 218*  BUN 16  CREATININE 0.72  CALCIUM 9.1  MG 1.7   ------------------------------------------------------------------------------------------------------------------  Cardiac Enzymes  Recent Labs Lab 03/15/17 1210  TROPONINI 0.03*   ------------------------------------------------------------------------------------------------------------------  RADIOLOGY:  Dg Chest 2 View  Result Date: 03/15/2017 CLINICAL DATA:  Shortness of breath. EXAM: CHEST  2 VIEW COMPARISON:  CT 11/19/2016. FINDINGS: Cardiomegaly with normal pulmonary vascularity. Mild basilar atelectasis. No pleural effusion or pneumothorax. No acute bony abnormality . IMPRESSION: 1. Cardiomegaly.  No pulmonary venous congestion . 2.  Mild basilar atelectasis . Electronically Signed   By: Maisie Fus  Register   On: 03/15/2017 12:17    ASSESSMENT AND PLAN:   Active Problems:   COPD exacerbation (HCC)   * Acute respiratory failure with hypoxia due to COPD exacerbation.   duoNeb every 6 hours, IV Solu-Medrol, nebulizer. Continue oxygen by nasal cannular. * Hypertension. Continue home hypertension medication. * Diabetes. Start sliding scale. * Morbid obesity. * Tobacco abuse. Smoking cessation was counseled for 3-4 minutes. Nicotine patch.  All the records are reviewed and case discussed with Care Management/Social Workerr. Management plans discussed with the patient, family and they  are in agreement.  CODE STATUS: full.  TOTAL  TIME TAKING CARE OF THIS PATIENT: 35 minutes.   POSSIBLE D/C IN 1-2 DAYS, DEPENDING ON CLINICAL CONDITION.   Altamese DillingVACHHANI, Lavaya Defreitas M.D on 03/16/2017   Between 7am to 6pm - Pager - [phone]  After 6pm go to [email or web address] - password [email or web address]  [facility]  Office  [phone]  CC: Primary care physician; Lorre MunroeBaity, Regina W, NP  Note: This dictation was prepared with Dragon dictation along with smaller phrase technology. Any transcriptional errors that result from this process are unintentional.

## 2017-03-16 NOTE — Progress Notes (Signed)
Inpatient Diabetes Program Recommendations  AACE/ADA: New Consensus Statement on Inpatient Glycemic Control (2015)  Target Ranges:  Prepandial:   less than 140 mg/dL      Peak postprandial:   less than 180 mg/dL (1-2 hours)      Critically ill patients:  140 - 180 mg/dL   Lab Results  Component Value Date   GLUCAP 244 (H) 03/16/2017   HGBA1C 6.9 (H) 11/28/2016    Review of Glycemic Control  Results for Jonathon Graham, Jonathon Graham (MRN [id number]) as of 03/16/2017 10:59  Ref. Range 03/15/2017 15:51 03/15/2017 21:20 03/16/2017 07:53  Glucose-Capillary Latest Ref Range: 65 - 99 mg/dL 409133 (H) 811312 (H) 914244 (H)    Diabetes history: Type 2 Outpatient Diabetes medications: Glucophage 1000mg  bid Current orders for Inpatient glycemic control: Novolog 0-9 units tid, Novolog 0-5 units qhs * steroids 60mg  IV q6h  Inpatient Diabetes Program Recommendations:  While the patient is on steroids, please consider adding Lantus 30 units (0.2units/kg) qhs  Add Novolog 5 units tid (hold if patient eats less than 50%)  Change sliding scale insulin to Novolog 0-20 units tid/hs.   Susette RacerJulie Adiya Selmer, RN, BA, MHA, CDE Diabetes Coordinator Inpatient Diabetes Program  [phone] (Team Pager) [phone] [facility] Office) 03/16/2017 11:03 AM

## 2017-03-17 LAB — GLUCOSE, CAPILLARY: Glucose-Capillary: 299 mg/dL — ABNORMAL HIGH (ref 65–99)

## 2017-03-17 MED ORDER — PREDNISONE 10 MG PO TABS: ORAL_TABLET | ORAL | 0 refills | Status: DC

## 2017-03-17 MED ORDER — PREDNISONE 50 MG PO TABS
50.0000 mg | ORAL_TABLET | Freq: Every day | ORAL | Status: DC
  Administered 2017-03-17: 50 mg via ORAL
  Filled 2017-03-17: qty 1

## 2017-03-17 MED ORDER — INSULIN ASPART 100 UNIT/ML ~~LOC~~ SOLN: 0.0000 [IU] | Freq: Three times a day (TID) | SUBCUTANEOUS | Status: DC

## 2017-03-17 MED ORDER — INSULIN ASPART 100 UNIT/ML ~~LOC~~ SOLN: 5.0000 [IU] | Freq: Three times a day (TID) | SUBCUTANEOUS | Status: DC

## 2017-03-17 MED ORDER — INSULIN ASPART 100 UNIT/ML ~~LOC~~ SOLN: 0.0000 [IU] | Freq: Every day | SUBCUTANEOUS | Status: DC

## 2017-03-17 MED ORDER — INSULIN GLARGINE 100 UNIT/ML ~~LOC~~ SOLN
20.0000 [IU] | Freq: Every day | SUBCUTANEOUS | Status: DC
  Administered 2017-03-17: 20 [IU] via SUBCUTANEOUS
  Filled 2017-03-17: qty 0.2

## 2017-03-17 NOTE — Discharge Summary (Signed)
[facility]   PATIENT NAME: Jonathon Graham    MR#:  [id number]  DATE OF BIRTH:  31-Mar-1956  DATE OF ADMISSION:  03/15/2017   ADMITTING PHYSICIAN: Shaune Pollack, MD  DATE OF DISCHARGE: 03/17/2017  2:25 PM  PRIMARY CARE PHYSICIAN: Lorre Munroe, NP   ADMISSION DIAGNOSIS:  Hypoxia [R09.02] COPD exacerbation (HCC) [J44.1] DISCHARGE DIAGNOSIS:  Active Problems:   COPD exacerbation (HCC)  SECONDARY DIAGNOSIS:   Past Medical History:  Diagnosis Date  . Arthritis   . Benign prostatic hyperplasia (BPH) with urinary urgency    nodular  . COPD (chronic obstructive pulmonary disease) (HCC)   . Depression   . Diabetes mellitus without complication (HCC)   . Essential hypertension   . Fibromyalgia, primary   . Hyperlipidemia   . Hypertension   . Obesity    HOSPITAL COURSE:  * Acute respiratory failure with hypoxia due to COPD exacerbation.  improved with duoNeb every 6 hours, IV Solu-Medrol, nebulizer. Off oxygen by nasal cannular. Change to prednisone taper. * Hypertension. Continue home hypertension medication. * Diabetes. He on sliding scale. Resume metformin after discharge in the follow-up PCP for adjustment. * Morbid obesity. * Tobacco abuse. Smoking cessation was counseled for 3-4 minutes. Nicotine patch. DISCHARGE CONDITIONS:  Table, discharge to home today. CONSULTS OBTAINED:   DRUG ALLERGIES:   Allergies  Allergen Reactions  . Penicillins Anaphylaxis and Swelling   DISCHARGE MEDICATIONS:   Allergies as of 03/17/2017      Reactions   Penicillins Anaphylaxis, Swelling      Medication List    STOP taking these medications   diclofenac sodium 1 % Gel Commonly known as:  VOLTAREN     TAKE these medications   acetaminophen 500 MG tablet Commonly known as:  TYLENOL Take 1 tablet (500 mg total) by mouth every 8 (eight) hours as needed.   albuterol (2.5 MG/3ML) 0.083% nebulizer solution Commonly known as:   PROVENTIL Take 3 mLs (2.5 mg total) by nebulization every 6 (six) hours as needed for wheezing or shortness of breath.   aspirin 81 MG tablet Take 81 mg by mouth daily. Takes 4 81mg  aspirin daily.   atorvastatin 20 MG tablet Commonly known as:  LIPITOR Take 1 tablet by mouth daily.   fluticasone furoate-vilanterol 200-25 MCG/INH Aepb Commonly known as:  BREO ELLIPTA Inhale 1 puff into the lungs daily.   furosemide 40 MG tablet Commonly known as:  LASIX Take 1 tablet (40 mg total) by mouth daily.   gabapentin 300 MG capsule Commonly known as:  NEURONTIN Take 1 capsule (300 mg total) by mouth 2 (two) times daily.   hydrochlorothiazide 12.5 MG capsule Commonly known as:  MICROZIDE Take 1 capsule by mouth daily.   Medical Compression Socks Misc 1 each by Does not apply route daily.   metFORMIN 1000 MG tablet Commonly known as:  GLUCOPHAGE Take 1 tablet (1,000 mg total) by mouth 2 (two) times daily with a meal.   predniSONE 10 MG tablet Commonly known as:  DELTASONE 40 mg po daily for 1 day, 30 mg po daily for 1 day, 20 mg po daily for 1 day, 10 mg po daily for 1 day.        DISCHARGE INSTRUCTIONS:  See AVS.  If you experience worsening of your admission symptoms, develop shortness of breath, life threatening emergency, suicidal or homicidal thoughts you must seek medical attention immediately by calling 911 or calling your MD immediately  if symptoms  less severe.  You Must read complete instructions/literature along with all the possible adverse reactions/side effects for all the Medicines you take and that have been prescribed to you. Take any new Medicines after you have completely understood and accpet all the possible adverse reactions/side effects.   Please note  You were cared for by a hospitalist during your hospital stay. If you have any questions about your discharge medications or the care you received while you were in the hospital after you are discharged,  you can call the unit and asked to speak with the hospitalist on call if the hospitalist that took care of you is not available. Once you are discharged, your primary care physician will handle any further medical issues. Please note that NO REFILLS for any discharge medications will be authorized once you are discharged, as it is imperative that you return to your primary care physician (or establish a relationship with a primary care physician if you do not have one) for your aftercare needs so that they can reassess your need for medications and monitor your lab values.    On the day of Discharge:  VITAL SIGNS:  Blood pressure (!) 157/83, pulse 62, temperature 98.1 F (36.7 C), temperature source Oral, resp. rate 18, height 5\' 8"  (1.727 m), weight (!) 323 lb (146.5 kg), SpO2 95 %. PHYSICAL EXAMINATION:  GENERAL:  61 y.o.-year-old patient lying in the bed with no acute distress. Morbid obesity. EYES: Pupils equal, round, reactive to light and accommodation. No scleral icterus. Extraocular muscles intact.  HEENT: Head atraumatic, normocephalic. Oropharynx and nasopharynx clear.  NECK:  Supple, no jugular venous distention. No thyroid enlargement, no tenderness.  LUNGS: Normal breath sounds bilaterally, no wheezing, rales,rhonchi or crepitation. No use of accessory muscles of respiration.  CARDIOVASCULAR: S1, S2 normal. No murmurs, rubs, or gallops.  ABDOMEN: Soft, non-tender, non-distended. Bowel sounds present. No organomegaly or mass.  EXTREMITIES: No pedal edema, cyanosis, or clubbing.  NEUROLOGIC: Cranial nerves II through XII are intact. Muscle strength 5/5 in all extremities. Sensation intact. Gait not checked.  PSYCHIATRIC: The patient is alert and oriented x 3.  SKIN: No obvious rash, lesion, or ulcer.  DATA REVIEW:   CBC  Recent Labs Lab 03/15/17 1210  WBC 6.8  HGB 14.1  HCT 43.5  PLT 185    Chemistries   Recent Labs Lab 03/15/17 1210  NA 140  K 4.0  CL 95*  CO2 33*   GLUCOSE 218*  BUN 16  CREATININE 0.72  CALCIUM 9.1  MG 1.7     Microbiology Results  Results for orders placed or performed during the hospital encounter of 11/19/16  MRSA PCR Screening     Status: None   Collection Time: 11/20/16  3:42 AM  Result Value Ref Range Status   MRSA by PCR NEGATIVE NEGATIVE Final    Comment:        The GeneXpert MRSA Assay (FDA approved for NASAL specimens only), is one component of a comprehensive MRSA colonization surveillance program. It is not intended to diagnose MRSA infection nor to guide or monitor treatment for MRSA infections.     RADIOLOGY:  No results found.   Management plans discussed with the patient, wife and they are in agreement.  CODE STATUS: Full Code   TOTAL TIME TAKING CARE OF THIS PATIENT: 33 minutes.    Shaune Pollackhen, Loi Rennaker M.D on 03/17/2017 at 3:40 PM  Between 7am to 6pm - Pager - [phone]  After 6pm go to [email or web address] - password  [email or web address]  [facility] [place] [facility]  Office  [phone]  CC: Primary care physician; Lorre Munroe, NP   Note: This dictation was prepared with Dragon dictation along with smaller phrase technology. Any transcriptional errors that result from this process are unintentional.

## 2017-03-17 NOTE — Discharge Instructions (Signed)
Heart healthy and ADA diet. °

## 2017-03-17 NOTE — Care Management Note (Signed)
Case Management Note  Patient Details  Name: Jonathon Graham MRN: [id number] Date of Birth: Feb 03, 1956  Subjective/Objective:    Per Barth Kirkseri, RN, Jonathon Graham walked around the nurses station without 02 this morning and maintained his 02 SATs above 90% on room air..                 Action/Plan:   Expected Discharge Date:  03/17/17               Expected Discharge Plan:     In-House Referral:     Discharge planning Services     Post Acute Care Choice:    Choice offered to:     DME Arranged:    DME Agency:     HH Arranged:    HH Agency:     Status of Service:     If discussed at [employer] Meetings, dates discussed:    Additional Comments:  Guinn Delarosa A, RN 03/17/2017, 12:15 PM

## 2017-03-17 NOTE — Progress Notes (Signed)
Patient cleared by MD Imogene Burnhen to be discharged. Per care manager, Pt needed to ambulate and have O2 sats monitored. Ambulated around the pod and patient O2 sats peaked at 93 and did not fall below 90. Discharge instructions explained. Handed prescription. Removed IV. Patient wheeled to entrance to meet wife.

## 2017-03-17 NOTE — Plan of Care (Signed)
Problem: Activity: Goal: Risk for activity intolerance will decrease Outcome: Progressing Patient ambulated around pod. O2 sats remained above 90 and peaked at 93.

## 2017-03-18 NOTE — Progress Notes (Signed)
[facility] [place] at [facility]        Jonathon Graham was admitted to the Hospital on 03/15/2017 and Discharged  03/18/2017 and should be excused from work/school   for 7 days starting 03/15/2017 , may return to work/school without any restrictions.  Jonathon Graham, Jonathon Graham M.D on 03/18/2017,at 3:53 PM  [facility] [place] [facility]    Office  [phone]

## 2017-03-24 ENCOUNTER — Other Ambulatory Visit: Payer: Self-pay

## 2017-03-24 ENCOUNTER — Emergency Department: Payer: BC Managed Care – PPO

## 2017-03-24 ENCOUNTER — Inpatient Hospital Stay
Admission: EM | Admit: 2017-03-24 | Discharge: 2017-03-27 | DRG: 193 | Disposition: A | Discharge status: Home / Self Care | Payer: BC Managed Care – PPO | Attending: Internal Medicine | Admitting: Internal Medicine

## 2017-03-24 ENCOUNTER — Encounter: Payer: Self-pay | Admitting: Emergency Medicine

## 2017-03-24 ENCOUNTER — Inpatient Hospital Stay: Payer: BC Managed Care – PPO

## 2017-03-24 DIAGNOSIS — Z23 Encounter for immunization: Secondary | ICD-10-CM | POA: Diagnosis not present

## 2017-03-24 DIAGNOSIS — I10 Essential (primary) hypertension: Secondary | ICD-10-CM | POA: Diagnosis present

## 2017-03-24 DIAGNOSIS — Z79899 Other long term (current) drug therapy: Secondary | ICD-10-CM | POA: Diagnosis not present

## 2017-03-24 DIAGNOSIS — Z7982 Long term (current) use of aspirin: Secondary | ICD-10-CM

## 2017-03-24 DIAGNOSIS — J441 Chronic obstructive pulmonary disease with (acute) exacerbation: Secondary | ICD-10-CM | POA: Diagnosis present

## 2017-03-24 DIAGNOSIS — F17210 Nicotine dependence, cigarettes, uncomplicated: Secondary | ICD-10-CM | POA: Diagnosis present

## 2017-03-24 DIAGNOSIS — F329 Major depressive disorder, single episode, unspecified: Secondary | ICD-10-CM | POA: Diagnosis present

## 2017-03-24 DIAGNOSIS — Z880 Allergy status to penicillin: Secondary | ICD-10-CM | POA: Diagnosis not present

## 2017-03-24 DIAGNOSIS — J9600 Acute respiratory failure, unspecified whether with hypoxia or hypercapnia: Secondary | ICD-10-CM

## 2017-03-24 DIAGNOSIS — Z8249 Family history of ischemic heart disease and other diseases of the circulatory system: Secondary | ICD-10-CM | POA: Diagnosis not present

## 2017-03-24 DIAGNOSIS — Z7984 Long term (current) use of oral hypoglycemic drugs: Secondary | ICD-10-CM

## 2017-03-24 DIAGNOSIS — R600 Localized edema: Secondary | ICD-10-CM | POA: Diagnosis present

## 2017-03-24 DIAGNOSIS — R748 Abnormal levels of other serum enzymes: Secondary | ICD-10-CM | POA: Diagnosis present

## 2017-03-24 DIAGNOSIS — N401 Enlarged prostate with lower urinary tract symptoms: Secondary | ICD-10-CM | POA: Diagnosis present

## 2017-03-24 DIAGNOSIS — G4733 Obstructive sleep apnea (adult) (pediatric): Secondary | ICD-10-CM | POA: Diagnosis present

## 2017-03-24 DIAGNOSIS — Z7951 Long term (current) use of inhaled steroids: Secondary | ICD-10-CM | POA: Diagnosis not present

## 2017-03-24 DIAGNOSIS — J9621 Acute and chronic respiratory failure with hypoxia: Secondary | ICD-10-CM | POA: Diagnosis present

## 2017-03-24 DIAGNOSIS — Z716 Tobacco abuse counseling: Secondary | ICD-10-CM

## 2017-03-24 DIAGNOSIS — E1142 Type 2 diabetes mellitus with diabetic polyneuropathy: Secondary | ICD-10-CM | POA: Diagnosis present

## 2017-03-24 DIAGNOSIS — Z833 Family history of diabetes mellitus: Secondary | ICD-10-CM | POA: Diagnosis not present

## 2017-03-24 DIAGNOSIS — E785 Hyperlipidemia, unspecified: Secondary | ICD-10-CM | POA: Diagnosis present

## 2017-03-24 DIAGNOSIS — R0602 Shortness of breath: Secondary | ICD-10-CM | POA: Diagnosis present

## 2017-03-24 DIAGNOSIS — Z9119 Patient's noncompliance with other medical treatment and regimen: Secondary | ICD-10-CM

## 2017-03-24 DIAGNOSIS — J440 Chronic obstructive pulmonary disease with acute lower respiratory infection: Secondary | ICD-10-CM | POA: Diagnosis present

## 2017-03-24 DIAGNOSIS — J181 Lobar pneumonia, unspecified organism: Principal | ICD-10-CM | POA: Diagnosis present

## 2017-03-24 DIAGNOSIS — M797 Fibromyalgia: Secondary | ICD-10-CM | POA: Diagnosis present

## 2017-03-24 DIAGNOSIS — Z6841 Body Mass Index (BMI) 40.0 and over, adult: Secondary | ICD-10-CM | POA: Diagnosis not present

## 2017-03-24 DIAGNOSIS — R0902 Hypoxemia: Secondary | ICD-10-CM

## 2017-03-24 DIAGNOSIS — J439 Emphysema, unspecified: Secondary | ICD-10-CM

## 2017-03-24 LAB — GLUCOSE, CAPILLARY
Glucose-Capillary: 220 mg/dL — ABNORMAL HIGH (ref 65–99)
Glucose-Capillary: 282 mg/dL — ABNORMAL HIGH (ref 65–99)

## 2017-03-24 LAB — BASIC METABOLIC PANEL
Anion gap: 8 (ref 5–15)
BUN: 16 mg/dL (ref 6–20)
CO2: 32 mmol/L (ref 22–32)
Calcium: 8.6 mg/dL — ABNORMAL LOW (ref 8.9–10.3)
Chloride: 100 mmol/L — ABNORMAL LOW (ref 101–111)
Creatinine, Ser: 0.63 mg/dL (ref 0.61–1.24)
GFR calc Af Amer: >60 mL/min (ref >60)
GFR calc non Af Amer: >60 mL/min (ref >60)
Glucose, Bld: 234 mg/dL — ABNORMAL HIGH (ref 65–99)
Potassium: 4.2 mmol/L (ref 3.5–5.1)
Sodium: 140 mmol/L (ref 135–145)

## 2017-03-24 LAB — TROPONIN I
Troponin I: 0.04 ng/mL (ref <0.03)
Troponin I: 0.03 ng/mL (ref <0.03)

## 2017-03-24 LAB — CBC
HCT: 43.1 % (ref 40.0–52.0)
Hemoglobin: 14.2 g/dL (ref 13.0–18.0)
MCH: 29.2 pg (ref 26.0–34.0)
MCHC: 32.9 g/dL (ref 32.0–36.0)
MCV: 88.7 fL (ref 80.0–100.0)
Platelets: 175 10*3/uL (ref 150–440)
RBC: 4.86 MIL/uL (ref 4.40–5.90)
RDW: 16.4 % — ABNORMAL HIGH (ref 11.5–14.5)
WBC: 9.7 10*3/uL (ref 3.8–10.6)

## 2017-03-24 LAB — BRAIN NATRIURETIC PEPTIDE: B Natriuretic Peptide: 242 pg/mL — ABNORMAL HIGH (ref 0.0–100.0)

## 2017-03-24 MED ORDER — IPRATROPIUM-ALBUTEROL 0.5-2.5 (3) MG/3ML IN SOLN
3.0000 mL | Freq: Once | RESPIRATORY_TRACT | Status: AC
  Administered 2017-03-24: 3 mL via RESPIRATORY_TRACT

## 2017-03-24 MED ORDER — IOPAMIDOL (ISOVUE-370) INJECTION 76%
100.0000 mL | Freq: Once | INTRAVENOUS | Status: AC | PRN
  Administered 2017-03-24: 100 mL via INTRAVENOUS

## 2017-03-24 MED ORDER — INFLUENZA VAC SPLIT QUAD 0.5 ML IM SUSY
0.5000 mL | PREFILLED_SYRINGE | INTRAMUSCULAR | Status: AC
  Administered 2017-03-26: 0.5 mL via INTRAMUSCULAR
  Filled 2017-03-24 (×2): qty 0.5

## 2017-03-24 MED ORDER — BISACODYL 10 MG RE SUPP: 10.0000 mg | Freq: Every day | RECTAL | Status: DC | PRN

## 2017-03-24 MED ORDER — METHYLPREDNISOLONE SODIUM SUCC 125 MG IJ SOLR
60.0000 mg | Freq: Four times a day (QID) | INTRAMUSCULAR | Status: DC
  Administered 2017-03-24 – 2017-03-25 (×2): 60 mg via INTRAVENOUS
  Filled 2017-03-24 (×2): qty 2

## 2017-03-24 MED ORDER — LORAZEPAM 2 MG/ML IJ SOLN: 0.5000 mg | INTRAMUSCULAR | Status: DC | PRN

## 2017-03-24 MED ORDER — ONDANSETRON HCL 4 MG PO TABS: 4.0000 mg | ORAL_TABLET | Freq: Four times a day (QID) | ORAL | Status: DC | PRN

## 2017-03-24 MED ORDER — DEXTROSE 5 % IV SOLN: 500.0000 mg | INTRAVENOUS | Status: DC

## 2017-03-24 MED ORDER — ACETYLCYSTEINE 20 % IN SOLN
4.0000 mL | Freq: Once | RESPIRATORY_TRACT | Status: AC
  Administered 2017-03-24: 4 mL via RESPIRATORY_TRACT
  Filled 2017-03-24: qty 4

## 2017-03-24 MED ORDER — FUROSEMIDE 10 MG/ML IJ SOLN
40.0000 mg | Freq: Two times a day (BID) | INTRAMUSCULAR | Status: AC
  Administered 2017-03-25: 40 mg via INTRAVENOUS
  Filled 2017-03-24 (×2): qty 4

## 2017-03-24 MED ORDER — ENOXAPARIN SODIUM 40 MG/0.4ML ~~LOC~~ SOLN
40.0000 mg | SUBCUTANEOUS | Status: DC
  Administered 2017-03-24: 40 mg via SUBCUTANEOUS
  Filled 2017-03-24: qty 0.4

## 2017-03-24 MED ORDER — IPRATROPIUM-ALBUTEROL 0.5-2.5 (3) MG/3ML IN SOLN
3.0000 mL | Freq: Four times a day (QID) | RESPIRATORY_TRACT | Status: DC
  Administered 2017-03-24 – 2017-03-27 (×12): 3 mL via RESPIRATORY_TRACT
  Filled 2017-03-24 (×12): qty 3

## 2017-03-24 MED ORDER — ACETAMINOPHEN 650 MG RE SUPP
650.0000 mg | Freq: Four times a day (QID) | RECTAL | Status: DC | PRN
Start: 2017-03-24 — End: 2017-03-27

## 2017-03-24 MED ORDER — SODIUM CHLORIDE 0.9 % IV SOLN
INTRAVENOUS | Status: DC
  Administered 2017-03-24: 21:00:00 via INTRAVENOUS

## 2017-03-24 MED ORDER — GABAPENTIN 300 MG PO CAPS
300.0000 mg | ORAL_CAPSULE | Freq: Two times a day (BID) | ORAL | Status: DC
  Administered 2017-03-24 – 2017-03-27 (×6): 300 mg via ORAL
  Filled 2017-03-24 (×6): qty 1

## 2017-03-24 MED ORDER — INSULIN ASPART 100 UNIT/ML ~~LOC~~ SOLN: 0.0000 [IU] | Freq: Three times a day (TID) | SUBCUTANEOUS | Status: DC

## 2017-03-24 MED ORDER — DOCUSATE SODIUM 100 MG PO CAPS
100.0000 mg | ORAL_CAPSULE | Freq: Two times a day (BID) | ORAL | Status: DC
  Administered 2017-03-24 – 2017-03-27 (×6): 100 mg via ORAL
  Filled 2017-03-24 (×6): qty 1

## 2017-03-24 MED ORDER — MAGNESIUM SULFATE 2 GM/50ML IV SOLN
2.0000 g | Freq: Once | INTRAVENOUS | Status: AC
Start: 2017-03-24 — End: 2017-03-24
  Administered 2017-03-24: 2 g via INTRAVENOUS
  Filled 2017-03-24: qty 50

## 2017-03-24 MED ORDER — INSULIN ASPART 100 UNIT/ML ~~LOC~~ SOLN
0.0000 [IU] | Freq: Every day | SUBCUTANEOUS | Status: DC
  Administered 2017-03-24: 3 [IU] via SUBCUTANEOUS
  Filled 2017-03-24: qty 1

## 2017-03-24 MED ORDER — MORPHINE SULFATE (PF) 2 MG/ML IV SOLN: 2.0000 mg | INTRAVENOUS | Status: DC | PRN

## 2017-03-24 MED ORDER — INSULIN ASPART 100 UNIT/ML ~~LOC~~ SOLN
0.0000 [IU] | Freq: Three times a day (TID) | SUBCUTANEOUS | Status: DC
  Administered 2017-03-25: 3 [IU] via SUBCUTANEOUS
  Administered 2017-03-25: 7 [IU] via SUBCUTANEOUS
  Administered 2017-03-26: 5 [IU] via SUBCUTANEOUS
  Filled 2017-03-24 (×3): qty 1

## 2017-03-24 MED ORDER — PANTOPRAZOLE SODIUM 40 MG IV SOLR
40.0000 mg | Freq: Two times a day (BID) | INTRAVENOUS | Status: DC
  Administered 2017-03-24 – 2017-03-26 (×4): 40 mg via INTRAVENOUS
  Filled 2017-03-24 (×4): qty 40

## 2017-03-24 MED ORDER — FLUTICASONE FUROATE-VILANTEROL 200-25 MCG/INH IN AEPB
1.0000 | INHALATION_SPRAY | Freq: Every day | RESPIRATORY_TRACT | Status: DC
  Filled 2017-03-24: qty 28

## 2017-03-24 MED ORDER — ATORVASTATIN CALCIUM 20 MG PO TABS
20.0000 mg | ORAL_TABLET | Freq: Every day | ORAL | Status: DC
  Administered 2017-03-24 – 2017-03-27 (×4): 20 mg via ORAL
  Filled 2017-03-24 (×4): qty 1

## 2017-03-24 MED ORDER — HYDROCHLOROTHIAZIDE 12.5 MG PO CAPS
12.5000 mg | ORAL_CAPSULE | Freq: Every day | ORAL | Status: DC
  Administered 2017-03-24 – 2017-03-27 (×4): 12.5 mg via ORAL
  Filled 2017-03-24 (×4): qty 1

## 2017-03-24 MED ORDER — METHYLPREDNISOLONE SODIUM SUCC 125 MG IJ SOLR
125.0000 mg | Freq: Once | INTRAMUSCULAR | Status: AC
  Administered 2017-03-24: 125 mg via INTRAVENOUS
  Filled 2017-03-24: qty 2

## 2017-03-24 MED ORDER — IPRATROPIUM-ALBUTEROL 0.5-2.5 (3) MG/3ML IN SOLN
RESPIRATORY_TRACT | Status: AC
  Administered 2017-03-24: 3 mL via RESPIRATORY_TRACT
  Filled 2017-03-24: qty 9

## 2017-03-24 MED ORDER — DEXTROSE 5 % IV SOLN
INTRAVENOUS | Status: AC
  Administered 2017-03-24: 500 mg via INTRAVENOUS
  Filled 2017-03-24: qty 500

## 2017-03-24 MED ORDER — ACETAMINOPHEN 325 MG PO TABS: 650.0000 mg | ORAL_TABLET | Freq: Four times a day (QID) | ORAL | Status: DC | PRN

## 2017-03-24 MED ORDER — ONDANSETRON HCL 4 MG/2ML IJ SOLN: 4.0000 mg | Freq: Four times a day (QID) | INTRAMUSCULAR | Status: DC | PRN

## 2017-03-24 MED ORDER — ASPIRIN EC 81 MG PO TBEC
81.0000 mg | DELAYED_RELEASE_TABLET | Freq: Every day | ORAL | Status: DC
  Administered 2017-03-24 – 2017-03-27 (×4): 81 mg via ORAL
  Filled 2017-03-24 (×4): qty 1

## 2017-03-24 NOTE — ED Notes (Signed)
MD at bedside. Patient and patient's wife in room at this time. Breathing treatment finished and pt placed on 3L [place].

## 2017-03-24 NOTE — ED Notes (Signed)
Pt transport to 248

## 2017-03-24 NOTE — ED Provider Notes (Signed)
[facility] Emergency Department Provider Note   ____________________________________________   I have reviewed the triage vital signs and the nursing notes.   HISTORY  Chief Complaint Shortness of Breath   History limited by: Not Limited   HPI Jonathon Graham is a 61 y.o. male who presents to the emergency department today because of shortness of breath  DURATION:1 week TIMING: constant SEVERITY: severe CONTEXT: patient with recent hospitalization due to hypoxia and COPD exacerbation. Since finishing steroids his breathing has gotten bad again.  MODIFYING FACTORS: has tried his home nebulizer and inhaler without relief ASSOCIATED SYMPTOMS: orthopnea. Congestion. Patient has had a productive cough. No bloody sputum. Denies chest pain. Denies fever.  Per medical record review patient has a history of recent admission for COPD and hypoxia. Has had a couple admissions for hypoxia in the past.  Past Medical History:  Diagnosis Date  . Arthritis   . Benign prostatic hyperplasia (BPH) with urinary urgency    nodular  . COPD (chronic obstructive pulmonary disease) (HCC)   . Depression   . Diabetes mellitus without complication (HCC)   . Essential hypertension   . Fibromyalgia, primary   . Hyperlipidemia   . Hypertension   . Obesity     Patient Active Problem List   Diagnosis Date Noted  . COPD exacerbation (HCC) 03/15/2017  . Right shoulder pain 11/29/2016  . Arthritis 03/14/2016  . BPH (benign prostatic hyperplasia) 03/14/2016  . Depression 03/14/2016  . HTN (hypertension) 03/14/2016  . Fibromyalgia 03/14/2016  . HLD (hyperlipidemia) 03/14/2016  . Insomnia 03/14/2016  . COPD (chronic obstructive pulmonary disease) (HCC) 11/26/2015  . Severe obstructive sleep apnea 06/15/2015  . Tobacco use 06/15/2015  . BMI 50.0-59.9, adult (HCC) 05/26/2015  . Type 2 diabetes, controlled, with peripheral neuropathy (HCC) 05/04/2015    History reviewed. No  pertinent surgical history.  Prior to Admission medications   Medication Sig Start Date End Date Taking? Authorizing Provider  acetaminophen (TYLENOL) 500 MG tablet Take 1 tablet (500 mg total) by mouth every 8 (eight) hours as needed. 01/05/16   Krebs, Laurel Dimmer, NP  albuterol (PROVENTIL) (2.5 MG/3ML) 0.083% nebulizer solution Take 3 mLs (2.5 mg total) by nebulization every 6 (six) hours as needed for wheezing or shortness of breath. 06/29/16   Lorre Munroe, NP  aspirin 81 MG tablet Take 81 mg by mouth daily. Takes 4 81mg  aspirin daily.    [provider]  atorvastatin (LIPITOR) 20 MG tablet Take 1 tablet by mouth daily. 03/09/17   [provider]  Elastic Bandages & Supports (MEDICAL COMPRESSION SOCKS) MISC 1 each by Does not apply route daily. 01/05/16   Krebs, Amy Lauren, NP  fluticasone furoate-vilanterol (BREO ELLIPTA) 200-25 MCG/INH AEPB Inhale 1 puff into the lungs daily. 12/11/16   Eustaquio Boyden, MD  furosemide (LASIX) 40 MG tablet Take 1 tablet (40 mg total) by mouth daily. 10/31/16   Lorre Munroe, NP  gabapentin (NEURONTIN) 300 MG capsule Take 1 capsule (300 mg total) by mouth 2 (two) times daily. 11/28/16   Eustaquio Boyden, MD  hydrochlorothiazide (MICROZIDE) 12.5 MG capsule Take 1 capsule by mouth daily. 03/09/17   [provider]  metFORMIN (GLUCOPHAGE) 1000 MG tablet Take 1 tablet (1,000 mg total) by mouth 2 (two) times daily with a meal. 02/14/17   Baity, Salvadore Oxford, NP  predniSONE (DELTASONE) 10 MG tablet 40 mg po daily for 1 day, 30 mg po daily for 1 day, 20 mg po daily for 1 day,  10 mg po daily for 1 day. 03/17/17   Shaune Pollackhen, Qing, MD    Allergies Penicillins  Family History  Problem Relation Age of Onset  . Heart disease Father   . Diabetes Mellitus II Sister   . Myasthenia gravis Sister   . Fibrocystic breast disease Sister   . Hypertension Sister   . Diabetes Mellitus II Brother     Social History Social History  Substance Use Topics  .  Smoking status: Current Some Day Smoker    Types: Cigarettes    Last attempt to quit: 04/04/2015  . Smokeless tobacco: Never Used     Comment: 3-4 daily  . Alcohol use No    Review of Systems Constitutional: No fever/chills Eyes: No visual changes. ENT: No sore throat. Cardiovascular: Denies chest pain. Respiratory: Positive for shortness of breath. Gastrointestinal: No abdominal pain.  No nausea, no vomiting.  No diarrhea.   Genitourinary: Negative for dysuria. Musculoskeletal: Negative for back pain. Skin: Negative for rash. Neurological: Negative for headaches, focal weakness or numbness.  ____________________________________________   PHYSICAL EXAM:  VITAL SIGNS: ED Triage Vitals  Enc Vitals Group     BP 03/24/17 1447 (!) 145/82     Pulse Rate 03/24/17 1452 98     Resp 03/24/17 1452 (!) 30     Temp 03/24/17 1447 98.8 F (37.1 C)     Temp Source 03/24/17 1447 Oral     SpO2 03/24/17 1447 (!) 80 %     Weight 03/24/17 1449 (!) 333 lb (151 kg)     Height 03/24/17 1449 5\' 8"  (1.727 m)   Constitutional: Alert and oriented. Well appearing and in no distress. Eyes: Conjunctivae are normal.  ENT   Head: Normocephalic and atraumatic.   Nose: No congestion/rhinnorhea.   Mouth/Throat: Mucous membranes are moist.   Neck: No stridor. Hematological/Lymphatic/Immunilogical: No cervical lymphadenopathy. Cardiovascular: Normal rate, regular rhythm.  No murmurs, rubs, or gallops. Respiratory: Slightly increased respiratory effort. Diffuse expiratory wheezing. Gastrointestinal: Soft and non tender. No rebound. No guarding.  Genitourinary: Deferred Musculoskeletal: Normal range of motion in all extremities. Bilateral lower extremity edema, left greater than right. Neurologic:  Normal speech and language. No gross focal neurologic deficits are appreciated.  Skin:  Skin is warm, dry and intact. No rash noted. Psychiatric: Mood and affect are normal. Speech and behavior are  normal. Patient exhibits appropriate insight and judgment.  ____________________________________________    LABS (pertinent positives/negatives)  Trop 0.04 BNP 242.0 CBC wnl except for RDW BMP wnl except for chl 100, glu 234, ca 8.6   ____________________________________________   EKG  I, Phineas SemenGraydon Axiel Fjeld, attending physician, personally viewed and interpreted this EKG  EKG Time: 1453 Rate: 100 Rhythm: normal sinus rhythm Axis: rightward axis Intervals: qtc 516 QRS: RBBB ST changes: no st elevation Impression: abnormal ekg   ____________________________________________    RADIOLOGY  CXR Pulmonary vascular congestion   ____________________________________________   PROCEDURES  Procedures  CRITICAL CARE Performed by: Phineas SemenGOODMAN, Mikeal Winstanley   Total critical care time: 35 minutes  Critical care time was exclusive of separately billable procedures and treating other patients.  Critical care was necessary to treat or prevent imminent or life-threatening deterioration.  Critical care was time spent personally by me on the following activities: development of treatment plan with patient and/or surrogate as well as nursing, discussions with consultants, evaluation of patient's response to treatment, examination of patient, obtaining history from patient or surrogate, ordering and performing treatments and interventions, ordering and review of laboratory studies, ordering and  review of radiographic studies, pulse oximetry and re-evaluation of patient's condition.  ____________________________________________   INITIAL IMPRESSION / ASSESSMENT AND PLAN / ED COURSE  Pertinent labs & imaging results that were available during my care of the patient were reviewed by me and considered in my medical decision making (see chart for details).  Differential includes, but is not limited to, viral syndrome, bronchitis including COPD exacerbation, pneumonia, reactive airway disease  including asthma, CHF including exacerbation with or without pulmonary/interstitial edema, pneumothorax, ACS, thoracic trauma, and pulmonary embolism. Patient does have some swelling of the lower extremities however states he has had multiple ultrasounds done in the past without any evidence of blood clots.  Patient's oxygen did improve once he was placed on oxygen.  He did feel better after DuoNeb and Mucomyst administration however continued to require oxygen.  Troponin was mildly elevated however this does appear to be roughly patient's baseline.  Will plan on admission for further workup and evaluation.  Discussed the testing results and plan with family patient.  ____________________________________________   FINAL CLINICAL IMPRESSION(S) / ED DIAGNOSES  Final diagnoses:  SOB (shortness of breath)  Hypoxia  Elevated troponin   Note: This dictation was prepared with Dragon dictation. Any transcriptional errors that result from this process are unintentional     Phineas Semen, MD 03/24/17 1733

## 2017-03-24 NOTE — ED Notes (Signed)
RN spoke to Dr. Judithann SheenSparks about BP medications. No home medications ordered and pts BP is elevated. MD verbalized to check with pharmacy on home medications. Pharmacy reports pt takes BP medications at home. Medications ordered per MD verbal order.

## 2017-03-24 NOTE — ED Triage Notes (Signed)
Pt has had recent hospital admissions for hypoxia without finding reason for it. C[facility]  sats in 70s in triage RA.  Placed on [place] at 2 L and increased to 4 L. sats low 90s on 4.  Denies pain.  No known fevers. Sleeping on 4 pillows.

## 2017-03-24 NOTE — H&P (Signed)
History and Physical    Jonathon PaulaKeith Alfrey ZOX:[id number] DOB: 11-Sep-1955 DOA: 03/24/2017  Referring physician: Dr. Derrill KayGoodman PCP: Lorre MunroeBaity, Regina W, NP  Specialists: none  Chief Complaint: SOB  HPI: Jonathon Graham is a 61 y.o. male has a past medical history significant for HTN, DM, obesity, COPD, and OSA on CPAP now with 2-3 day hx of worsening SOB. Not on home O2 but hypoxic here requiring 4l O2. No improvement in ER with IV steroids and SVN's. He is now admitted. No fever. Denies Cp or palpitations. Some cough with clear sputum. CXR negative for pneumonia.  Review of Systems: The patient denies anorexia, fever, weight loss,, vision loss, decreased hearing, hoarseness, chest pain, syncope, balance deficits, hemoptysis, abdominal pain, melena, hematochezia, severe indigestion/heartburn, hematuria, incontinence, genital sores, muscle weakness, suspicious skin lesions, transient blindness, difficulty walking, depression, unusual weight change, abnormal bleeding, enlarged lymph nodes, angioedema, and breast masses.   Past Medical History:  Diagnosis Date  . Arthritis   . Benign prostatic hyperplasia (BPH) with urinary urgency    nodular  . COPD (chronic obstructive pulmonary disease) (HCC)   . Depression   . Diabetes mellitus without complication (HCC)   . Essential hypertension   . Fibromyalgia, primary   . Hyperlipidemia   . Hypertension   . Obesity    History reviewed. No pertinent surgical history. Social History:  reports that he has been smoking Cigarettes.  He has never used smokeless tobacco. He reports that he does not drink alcohol or use drugs.  Allergies  Allergen Reactions  . Penicillins Anaphylaxis and Swelling    Family History  Problem Relation Age of Onset  . Heart disease Father   . Diabetes Mellitus II Sister   . Myasthenia gravis Sister   . Fibrocystic breast disease Sister   . Hypertension Sister   . Diabetes Mellitus II Brother     Prior to Admission  medications   Medication Sig Start Date End Date Taking? Authorizing Provider  acetaminophen (TYLENOL) 500 MG tablet Take 1 tablet (500 mg total) by mouth every 8 (eight) hours as needed. 01/05/16   Krebs, Laurel DimmerAmy Lauren, NP  albuterol (PROVENTIL) (2.5 MG/3ML) 0.083% nebulizer solution Take 3 mLs (2.5 mg total) by nebulization every 6 (six) hours as needed for wheezing or shortness of breath. 06/29/16   Lorre MunroeBaity, Regina W, NP  aspirin 81 MG tablet Take 81 mg by mouth daily. Takes 4 81mg  aspirin daily.    [provider]  atorvastatin (LIPITOR) 20 MG tablet Take 1 tablet by mouth daily. 03/09/17   [provider]  Elastic Bandages & Supports (MEDICAL COMPRESSION SOCKS) MISC 1 each by Does not apply route daily. 01/05/16   Krebs, Amy Lauren, NP  fluticasone furoate-vilanterol (BREO ELLIPTA) 200-25 MCG/INH AEPB Inhale 1 puff into the lungs daily. 12/11/16   Eustaquio BoydenGutierrez, Javier, MD  furosemide (LASIX) 40 MG tablet Take 1 tablet (40 mg total) by mouth daily. 10/31/16   Lorre MunroeBaity, Regina W, NP  gabapentin (NEURONTIN) 300 MG capsule Take 1 capsule (300 mg total) by mouth 2 (two) times daily. 11/28/16   Eustaquio BoydenGutierrez, Javier, MD  hydrochlorothiazide (MICROZIDE) 12.5 MG capsule Take 1 capsule by mouth daily. 03/09/17   [provider]  metFORMIN (GLUCOPHAGE) 1000 MG tablet Take 1 tablet (1,000 mg total) by mouth 2 (two) times daily with a meal. 02/14/17   Baity, Salvadore Oxfordegina W, NP  predniSONE (DELTASONE) 10 MG tablet 40 mg po daily for 1 day, 30 mg po daily for 1 day, 20 mg po  daily for 1 day, 10 mg po daily for 1 day. 03/17/17   Shaune Pollack, MD   Physical Exam: Vitals:   03/24/17 1530 03/24/17 1600 03/24/17 1630 03/24/17 1634  BP: (!) 135/104 (!) 169/96 (!) 153/86   Pulse: 84 89 87 87  Resp: (!) 24 (!) 26 (!) 25 (!) 30  Temp:      TempSrc:      SpO2: (!) 89% (!) 85% (!) 89% 95%  Weight:      Height:         General:  Obese, [place]/AT, in moderate distress  Eyes: PERRL, EOMI, no scleral icterus, conjunctiva  clear  ENT: moist oropharynx without exudate, TM's benign, dentition good  Neck: supple, no lymphadenopathy. No bruits or thyromegaly  Cardiovascular: regular rate without MRG; 2+ peripheral pulses, no JVD, 1+ peripheral edema  Respiratory: decreased breath sounds with diffuse wheezing and rhonchi. No dullness or rales. Respiratory effort increased  Abdomen: soft, non tender to palpation, positive bowel sounds, no guarding, no rebound  Skin: no rashes or lesions  Musculoskeletal: normal bulk and tone, no joint swelling  Psychiatric: normal mood and affect, A&OX3  Neurologic: CN 2-12 grossly intact, Motor strength 5/5 in all 4 groups with symmetric DTR's and non-focal sensory exam  Labs on Admission:  Basic Metabolic Panel:  Recent Labs Lab 03/24/17 1514  NA 140  K 4.2  CL 100*  CO2 32  GLUCOSE 234*  BUN 16  CREATININE 0.63  CALCIUM 8.6*   Liver Function Tests: No results for input(s): AST, ALT, ALKPHOS, BILITOT, PROT, ALBUMIN in the last 168 hours. No results for input(s): LIPASE, AMYLASE in the last 168 hours. No results for input(s): AMMONIA in the last 168 hours. CBC:  Recent Labs Lab 03/24/17 1514  WBC 9.7  HGB 14.2  HCT 43.1  MCV 88.7  PLT 175   Cardiac Enzymes:  Recent Labs Lab 03/24/17 1514  TROPONINI 0.04*    BNP (last 3 results)  Recent Labs  11/28/16 1714 03/15/17 1210 03/24/17 1618  BNP 65.8 200.0* 242.0*    ProBNP (last 3 results) No results for input(s): PROBNP in the last 8760 hours.  CBG: No results for input(s): GLUCAP in the last 168 hours.  Radiological Exams on Admission: Dg Chest Portable 1 View  Result Date: 03/24/2017 CLINICAL DATA:  Patient reports SOB onset today. Reports recent hospitalization for hypoxia, was discharged without finding cause of hypoxia. Hx HTN, DM, COPD. Current smoker. EXAM: PORTABLE CHEST 1 VIEW COMPARISON:  03/15/2017 FINDINGS: The heart is enlarged and stable in configuration. There is  pulmonary vascular prominence without overt edema. There is mild atelectasis in the left mid lung zone. IMPRESSION: 1. Cardiomegaly and pulmonary vascular congestion. 2. Left mid lung zone atelectasis. Electronically Signed   By: Norva Pavlov M.D.   On: 03/24/2017 15:16    EKG: Independently reviewed.  Assessment/Plan Principal Problem:   Acute respiratory failure (HCC) Active Problems:   Type 2 diabetes, controlled, with peripheral neuropathy (HCC)   Severe obstructive sleep apnea   COPD exacerbation (HCC)   Will admit to floor with O2, IV ABX, IV steroids, and SVN's. CPAP qhs. Wean O2 as tolerated. Follow sugars. CT chest ordered to r/o PE. Echo ordered. Will consult Cardiology. IV lasix x 1 for possible diastolic CHF. Repeat labs in AM  Diet: low salt, low carb Fluids: NS@75  DVT Prophylaxis: Lovenox  Code Status: FULL  Family Communication: yes  Disposition Plan: home  Time spent: 50 min

## 2017-03-24 NOTE — ED Notes (Addendum)
Breathing treatment complete. No changes in WOB. Pt continues with productive cough with yellow sputum.   Pt placed in an upright position and SpO2 has risen to 95%

## 2017-03-24 NOTE — Progress Notes (Signed)
Patient has refused cpap at bedtime. He is not compliant with using his at home.

## 2017-03-25 LAB — COMPREHENSIVE METABOLIC PANEL
ALT: 22 U/L (ref 17–63)
AST: 19 U/L (ref 15–41)
Albumin: 4.1 g/dL (ref 3.5–5.0)
Alkaline Phosphatase: 46 U/L (ref 38–126)
Anion gap: 10 (ref 5–15)
BUN: 15 mg/dL (ref 6–20)
CO2: 31 mmol/L (ref 22–32)
Calcium: 8.6 mg/dL — ABNORMAL LOW (ref 8.9–10.3)
Chloride: 96 mmol/L — ABNORMAL LOW (ref 101–111)
Creatinine, Ser: 0.67 mg/dL (ref 0.61–1.24)
GFR calc Af Amer: >60 mL/min (ref >60)
GFR calc non Af Amer: >60 mL/min (ref >60)
Glucose, Bld: 242 mg/dL — ABNORMAL HIGH (ref 65–99)
Potassium: 5.3 mmol/L — ABNORMAL HIGH (ref 3.5–5.1)
Sodium: 137 mmol/L (ref 135–145)
Total Bilirubin: 0.9 mg/dL (ref 0.3–1.2)
Total Protein: 7.8 g/dL (ref 6.5–8.1)

## 2017-03-25 LAB — CBC
HCT: 45.1 % (ref 40.0–52.0)
Hemoglobin: 14.7 g/dL (ref 13.0–18.0)
MCH: 29.3 pg (ref 26.0–34.0)
MCHC: 32.6 g/dL (ref 32.0–36.0)
MCV: 89.8 fL (ref 80.0–100.0)
Platelets: 163 10*3/uL (ref 150–440)
RBC: 5.02 MIL/uL (ref 4.40–5.90)
RDW: 16.1 % — ABNORMAL HIGH (ref 11.5–14.5)
WBC: 9.9 10*3/uL (ref 3.8–10.6)

## 2017-03-25 LAB — GLUCOSE, CAPILLARY
Glucose-Capillary: 209 mg/dL — ABNORMAL HIGH (ref 65–99)
Glucose-Capillary: 341 mg/dL — ABNORMAL HIGH (ref 65–99)
Glucose-Capillary: 474 mg/dL — ABNORMAL HIGH (ref 65–99)
Glucose-Capillary: 463 mg/dL — ABNORMAL HIGH (ref 65–99)
Glucose-Capillary: 429 mg/dL — ABNORMAL HIGH (ref 65–99)

## 2017-03-25 LAB — TROPONIN I
Troponin I: 0.03 ng/mL (ref <0.03)
Troponin I: 0.03 ng/mL (ref <0.03)

## 2017-03-25 MED ORDER — INSULIN ASPART 100 UNIT/ML ~~LOC~~ SOLN: 0.0000 [IU] | Freq: Every day | SUBCUTANEOUS | Status: DC

## 2017-03-25 MED ORDER — LEVOFLOXACIN 750 MG PO TABS
750.0000 mg | ORAL_TABLET | Freq: Every day | ORAL | Status: DC
  Administered 2017-03-25 – 2017-03-27 (×3): 750 mg via ORAL
  Filled 2017-03-25 (×3): qty 1

## 2017-03-25 MED ORDER — INSULIN ASPART 100 UNIT/ML ~~LOC~~ SOLN
20.0000 [IU] | Freq: Once | SUBCUTANEOUS | Status: AC
  Administered 2017-03-25: 20 [IU] via SUBCUTANEOUS
  Filled 2017-03-25: qty 1

## 2017-03-25 MED ORDER — INSULIN ASPART 100 UNIT/ML ~~LOC~~ SOLN
16.0000 [IU] | Freq: Once | SUBCUTANEOUS | Status: AC
  Administered 2017-03-25: 16 [IU] via SUBCUTANEOUS
  Filled 2017-03-25: qty 1

## 2017-03-25 MED ORDER — METHYLPREDNISOLONE SODIUM SUCC 40 MG IJ SOLR
40.0000 mg | Freq: Once | INTRAMUSCULAR | Status: AC
  Administered 2017-03-25: 40 mg via INTRAVENOUS

## 2017-03-25 MED ORDER — BUDESONIDE 0.5 MG/2ML IN SUSP
0.5000 mg | Freq: Two times a day (BID) | RESPIRATORY_TRACT | Status: DC
  Administered 2017-03-25 – 2017-03-27 (×5): 0.5 mg via RESPIRATORY_TRACT
  Filled 2017-03-25 (×5): qty 2

## 2017-03-25 MED ORDER — FUROSEMIDE 40 MG PO TABS
40.0000 mg | ORAL_TABLET | Freq: Every day | ORAL | Status: DC
  Administered 2017-03-26 – 2017-03-27 (×2): 40 mg via ORAL
  Filled 2017-03-25 (×2): qty 1

## 2017-03-25 MED ORDER — FUROSEMIDE 10 MG/ML IJ SOLN
40.0000 mg | Freq: Once | INTRAMUSCULAR | Status: AC
  Administered 2017-03-25: 40 mg via INTRAVENOUS

## 2017-03-25 MED ORDER — INSULIN GLARGINE 100 UNIT/ML ~~LOC~~ SOLN
20.0000 [IU] | Freq: Every day | SUBCUTANEOUS | Status: DC
  Administered 2017-03-25 – 2017-03-26 (×2): 20 [IU] via SUBCUTANEOUS
  Filled 2017-03-25 (×3): qty 0.2

## 2017-03-25 MED ORDER — ENOXAPARIN SODIUM 40 MG/0.4ML ~~LOC~~ SOLN
40.0000 mg | Freq: Two times a day (BID) | SUBCUTANEOUS | Status: DC
  Filled 2017-03-25 (×3): qty 0.4

## 2017-03-25 MED ORDER — METHYLPREDNISOLONE SODIUM SUCC 40 MG IJ SOLR
40.0000 mg | Freq: Every day | INTRAMUSCULAR | Status: DC
  Administered 2017-03-26 – 2017-03-27 (×2): 40 mg via INTRAVENOUS
  Filled 2017-03-25 (×2): qty 1

## 2017-03-25 NOTE — Progress Notes (Signed)
Patient ID: Jonathon Graham, male   DOB: Jun 21, 1955, 61 y.o.   MRN: [id number]  [facility] PROGRESS NOTE  Jonathon PaulaKeith Bostwick [id number] DOB: Jun 21, 1955 DOA: 03/24/2017 PCP: Lorre MunroeBaity, Regina W, NP  HPI/Subjective: Patient feels like he has a cold.  He has been coughing and wheezing for a few days now.  Audible wheeze heard from sitting in the room with him.  Objective: Vitals:   03/25/17 0911 03/25/17 1149  BP:    Pulse:    Resp:    Temp:    SpO2: 92% 93%    Filed Weights   03/24/17 1449 03/24/17 1453 03/25/17 0445  Weight: (!) 151 kg (333 lb) (!) 151 kg (333 lb) (!) 151.6 kg (334 lb 3.2 oz)    ROS: Review of Systems  Constitutional: Negative for chills and fever.  Eyes: Negative for blurred vision.  Respiratory: Positive for cough, shortness of breath and wheezing.   Cardiovascular: Negative for chest pain.  Gastrointestinal: Negative for abdominal pain, constipation, diarrhea, nausea and vomiting.  Genitourinary: Negative for dysuria.  Musculoskeletal: Negative for joint pain.  Neurological: Negative for dizziness and headaches.   Exam: Physical Exam  Constitutional: He is oriented to person, place, and time.  HENT:  Nose: No mucosal edema.  Mouth/Throat: No oropharyngeal exudate or posterior oropharyngeal edema.  Eyes: Pupils are equal, round, and reactive to light. Conjunctivae, EOM and lids are normal.  Neck: No JVD present. Carotid bruit is not present. No edema present. No thyroid mass and no thyromegaly present.  Cardiovascular: S1 normal, S2 normal and normal heart sounds.  Exam reveals no gallop.   No murmur heard. Pulses:      Dorsalis pedis pulses are 2+ on the right side, and 2+ on the left side.  Respiratory: No respiratory distress. He has decreased breath sounds in the right middle field, the right lower field, the left middle field and the left lower field. He has wheezes in the right middle field, the right lower field, the left middle field and the  left lower field. He has no rhonchi. He has no rales.  GI: Soft. Bowel sounds are normal. There is no tenderness.  Musculoskeletal:       Right ankle: He exhibits swelling.       Left ankle: He exhibits swelling.  Lymphadenopathy:    He has no cervical adenopathy.  Neurological: He is alert and oriented to person, place, and time. No cranial nerve deficit.  Skin: Skin is warm. No rash noted. Nails show no clubbing.  Psychiatric: He has a normal mood and affect.      Data Reviewed: Basic Metabolic Panel:  Recent Labs Lab 03/24/17 1514 03/25/17 0205  NA 140 137  K 4.2 5.3*  CL 100* 96*  CO2 32 31  GLUCOSE 234* 242*  BUN 16 15  CREATININE 0.63 0.67  CALCIUM 8.6* 8.6*   Liver Function Tests:  Recent Labs Lab 03/25/17 0205  AST 19  ALT 22  ALKPHOS 46  BILITOT 0.9  PROT 7.8  ALBUMIN 4.1   CBC:  Recent Labs Lab 03/24/17 1514 03/25/17 0205  WBC 9.7 9.9  HGB 14.2 14.7  HCT 43.1 45.1  MCV 88.7 89.8  PLT 175 163   Cardiac Enzymes:  Recent Labs Lab 03/24/17 1514 03/24/17 2055 03/25/17 0205 03/25/17 0836  TROPONINI 0.04* 0.03* 0.03* 0.03*   BNP (last 3 results)  Recent Labs  11/28/16 1714 03/15/17 1210 03/24/17 1618  BNP 65.8 200.0* 242.0*     CBG:  Recent  Labs Lab 03/24/17 2013 03/24/17 2107 03/25/17 0735 03/25/17 1128  GLUCAP 220* 282* 209* 341*     Studies: Ct Angio Chest Pe W Or Wo Contrast  Addendum Date: 03/24/2017   ADDENDUM REPORT: 03/24/2017 19:32 ADDENDUM: Mild dilatation of the ascending aorta measuring up to 4.5 cm. Ascending thoracic aortic aneurysm. Recommend semi-annual imaging followup by CTA or MRA and referral to cardiothoracic surgery if not already obtained. This recommendation follows 2010 ACCF/AHA/AATS/ACR/ASA/SCA/SCAI/SIR/STS/SVM Guidelines for the Diagnosis and Management of Patients With Thoracic Aortic Disease. Circulation. 2010; 121: Z610-R604 Electronically Signed   By: Elgie Collard M.D.   On: 03/24/2017  19:32   Result Date: 03/24/2017 CLINICAL DATA:  61 year old male with shortness of breath. EXAM: CT ANGIOGRAPHY CHEST WITH CONTRAST TECHNIQUE: Multidetector CT imaging of the chest was performed using the standard protocol during bolus administration of intravenous contrast. Multiplanar CT image reconstructions and MIPs were obtained to evaluate the vascular anatomy. CONTRAST:  100 cc Isovue 370 COMPARISON:  Chest radiograph dated 03/24/2017 FINDINGS: Cardiovascular: There is moderate cardiomegaly. No pericardial effusion. There is mild atherosclerotic calcification of the thoracic aorta. Mildly dilated ascending aorta measuring up to 4.5 cm in greatest axial diameter. The origins of the great vessels of the aortic arch appear patent. Mild dilatation of the main pulmonary trunk suggestive of underlying pulmonary hypertension. Evaluation of the pulmonary artery is is limited due to suboptimal opacification of the peripheral branches. No large or central pulmonary artery embolus identified. V/Q scan may provide better evaluation if there is high clinical concern for acute PE. Mediastinum/Nodes: There is no hilar or mediastinal adenopathy. Esophagus and the thyroid gland appear unremarkable. No mediastinal fluid collection. Lungs/Pleura: There are bilateral upper lobe streaky densities as well as a more focal area of ground-glass opacity in the left upper lobe. Findings most consistent with an infectious process. Clinical correlation is recommended. Bilateral upper lobe linear atelectasis/scarring noted. There is no pleural effusion or pneumothorax. The central airways are patent. Upper Abdomen:  No acute findings. Musculoskeletal: No chest wall abnormality. No acute or significant osseous findings. Review of the MIP images confirms the above findings. IMPRESSION: 1. No CT evidence of central pulmonary artery embolus. V/Q scan may provide better evaluation if there is high clinical concern for acute PE. 2. Bilateral  upper lobe streaky and airspace densities concerning for infiltrate. Clinical correlation recommended. 3. Cardiomegaly. Electronically Signed: By: Elgie Collard M.D. On: 03/24/2017 18:52   Dg Chest Portable 1 View  Result Date: 03/24/2017 CLINICAL DATA:  Patient reports SOB onset today. Reports recent hospitalization for hypoxia, was discharged without finding cause of hypoxia. Hx HTN, DM, COPD. Current smoker. EXAM: PORTABLE CHEST 1 VIEW COMPARISON:  03/15/2017 FINDINGS: The heart is enlarged and stable in configuration. There is pulmonary vascular prominence without overt edema. There is mild atelectasis in the left mid lung zone. IMPRESSION: 1. Cardiomegaly and pulmonary vascular congestion. 2. Left mid lung zone atelectasis. Electronically Signed   By: Norva Pavlov M.D.   On: 03/24/2017 15:16    Scheduled Meds: . aspirin EC  81 mg Oral Daily  . atorvastatin  20 mg Oral Daily  . budesonide (PULMICORT) nebulizer solution  0.5 mg Nebulization BID  . docusate sodium  100 mg Oral BID  . enoxaparin (LOVENOX) injection  40 mg Subcutaneous Q12H  . furosemide  40 mg Intravenous Q12H  . [START ON 03/26/2017] furosemide  40 mg Oral Daily  . gabapentin  300 mg Oral BID  . hydrochlorothiazide  12.5 mg Oral Daily  .  Influenza vac split quadrivalent PF  0.5 mL Intramuscular Tomorrow-1000  . insulin aspart  0-5 Units Subcutaneous QHS  . insulin aspart  0-9 Units Subcutaneous TID WC  . ipratropium-albuterol  3 mL Nebulization QID  . levofloxacin  750 mg Oral Daily  . [START ON 03/26/2017] methylPREDNISolone (SOLU-MEDROL) injection  40 mg Intravenous Daily  . pantoprazole (PROTONIX) IV  40 mg Intravenous Q12H    Assessment/Plan:  1. Acute hypoxic respiratory failure.  Patient currently on 4 L of oxygen.  Patient asking if he can have oxygen at home. 2. COPD exacerbation.  Continue Solu-Medrol 40 mg daily.  DuoNeb nebulizer solution and budesonide nebulizers.  Patient recently in the hospital  for the same reason. 3. Tobacco abuse.  Smoking cessation counseling done 4 minutes by me.  Patient does not want a nicotine patch and would like to go cold [place]. 4. Pneumonia seen on CT scan of the chest.  Levaquin started. 5. Sleep apnea and morbid obesity.  CPAP at night 6. Type 2 diabetes mellitus on sliding scale insulin 7. Essential hypertension continue usual medications 8. Edema of the lower extremities.  Stop IV fluids.  Give 1 dose of IV Lasix today and back to oral Lasix tomorrow.  Code Status:     Code Status Orders        Start     Ordered   03/24/17 2035  Full code  Continuous     03/24/17 2034    Code Status History    Date Active Date Inactive Code Status Order ID Comments User Context   03/15/2017  3:47 PM 03/17/2017  5:30 PM Full Code [id number]  Shaune Pollack, MD Inpatient   11/20/2016  3:44 AM 11/22/2016  4:30 PM Full Code [id number]  Hugelmeyer, Jon Gills, DO Inpatient      Disposition Plan: May need a few days in the hospital  Time spent: 28 minutes  Alford Highland  [employer]

## 2017-03-25 NOTE — Progress Notes (Signed)
Order for enoxaparin 40 mg subQ daily changed to enoxaparin 40 mg subcutaneously BID per protocol for CrCl > 30 mL/min and BMI >40.  Cindi CarbonMary M Eesa Justiss, PharmD 03/25/17 12:25 PM

## 2017-03-25 NOTE — Progress Notes (Addendum)
Pt's pulse ox 83%. In to see pt. Pt with cpap off and in floor. Reinforced to pt the importance of wearing cpap. Pt states that he cannot wear it. Pt placed on 4liters [place] for 92% Will continue to monitor and assess.

## 2017-03-25 NOTE — Plan of Care (Signed)
Problem: Safety: Goal: Ability to remain free from injury will improve Outcome: Progressing Pt's bed in lowest position, call bell within reach, bed alarm on and educated on calling for help. Pt verbalized understanding.

## 2017-03-25 NOTE — Progress Notes (Signed)
cpap on sb. Will have rn to call if he decides he wants to wear

## 2017-03-25 NOTE — Progress Notes (Signed)
Placed patient on auto cpap with 4 liters due to low sats in mid 80s when he falls off to sleep. Patient tolerated well

## 2017-03-25 NOTE — Progress Notes (Signed)
Pt's blood glucose of 474 was reported to MD. Verbal orders received to give 16 units of insulin aspart (Novolog) . Verbal orders read back, gave the 16 units. Will continue to monitor patient.

## 2017-03-26 LAB — GLUCOSE, CAPILLARY
Glucose-Capillary: 109 mg/dL — ABNORMAL HIGH (ref 65–99)
Glucose-Capillary: 294 mg/dL — ABNORMAL HIGH (ref 65–99)
Glucose-Capillary: 323 mg/dL — ABNORMAL HIGH (ref 65–99)
Glucose-Capillary: 288 mg/dL — ABNORMAL HIGH (ref 65–99)

## 2017-03-26 MED ORDER — PANTOPRAZOLE SODIUM 40 MG PO TBEC
40.0000 mg | DELAYED_RELEASE_TABLET | Freq: Two times a day (BID) | ORAL | Status: DC
  Administered 2017-03-26 – 2017-03-27 (×3): 40 mg via ORAL
  Filled 2017-03-26 (×3): qty 1

## 2017-03-26 MED ORDER — INSULIN ASPART 100 UNIT/ML ~~LOC~~ SOLN
0.0000 [IU] | Freq: Three times a day (TID) | SUBCUTANEOUS | Status: DC
Start: 2017-03-26 — End: 2017-03-27
  Administered 2017-03-26: 11 [IU] via SUBCUTANEOUS
  Administered 2017-03-27: 8 [IU] via SUBCUTANEOUS
  Administered 2017-03-27: 3 [IU] via SUBCUTANEOUS
  Filled 2017-03-26 (×3): qty 1

## 2017-03-26 MED ORDER — HYDROCODONE-ACETAMINOPHEN 5-325 MG PO TABS: 1.0000 | ORAL_TABLET | Freq: Four times a day (QID) | ORAL | Status: DC | PRN

## 2017-03-26 MED ORDER — INSULIN ASPART 100 UNIT/ML ~~LOC~~ SOLN
0.0000 [IU] | Freq: Every day | SUBCUTANEOUS | Status: DC
  Administered 2017-03-26: 3 [IU] via SUBCUTANEOUS
  Filled 2017-03-26: qty 1

## 2017-03-26 MED ORDER — SODIUM CHLORIDE 0.9% FLUSH
3.0000 mL | Freq: Two times a day (BID) | INTRAVENOUS | Status: DC
  Administered 2017-03-26 – 2017-03-27 (×3): 3 mL via INTRAVENOUS

## 2017-03-26 NOTE — Progress Notes (Signed)
He refuses Lovenox. Says he's had numerous U/S of his legs and has never had DVTs.  Education regarding the benefit of Lovenox.  Says he understands and still doesn't want it.

## 2017-03-26 NOTE — Consult Note (Signed)
Pulmonary Critical Care  Initial Consult Note   Jonathon PaulaKeith Chihuahua ZOX:[id number] DOB: 11-27-1955 DOA: 03/24/2017  Referring physician: Dr Madelon LipsVacchani PCP: Lorre MunroeBaity, Regina W, NP   Chief Complaint: Respiratory failure  HPI: Jonathon Graham is a 61 y.o. male with prior history of sleep apnea popping syndrome morbid obesity COPD hypertension diabetes came into the hospital with increasing shortness of breath.  Patient states he has been having shortness of breath for at least 2 or 3 days prior to admission.  Patient states there is cough and congestion noted.  Also patient states that he has not been using his CPAP as prescribed.  Patient has had no hemoptysis.  Patient has had no chest pain.  He states that he has been worked up for chest pain in the past and apparently this was a negative workup.  States he is starting to feel better now since he has been in the hospital.   Review of Systems:  Constitutional:  No weight loss, night sweats, Fevers, chills, +fatigue.  HEENT:  No headaches, nasal congestion, post nasal drip,  Cardio-vascular:  No chest pain, +swelling in lower extremities, no palpitations  GI:  No heartburn, indigestion, abdominal pain, nausea, vomiting, diarrhea  Resp:  +shortness of breath +productive cough, No coughing up of blood.No +wheezing Skin:  no rash or lesions.  Musculoskeletal:  No joint pain or swelling.   Remainder ROS performed and is unremarkable other than noted in HPI  Past Medical History:  Diagnosis Date  . Arthritis   . Benign prostatic hyperplasia (BPH) with urinary urgency    nodular  . COPD (chronic obstructive pulmonary disease) (HCC)   . Depression   . Diabetes mellitus without complication (HCC)   . Essential hypertension   . Fibromyalgia, primary   . Hyperlipidemia   . Hypertension   . Obesity    History reviewed. No pertinent surgical history. Social History:  reports that he has been smoking Cigarettes.  He has never used smokeless  tobacco. He reports that he does not drink alcohol or use drugs.  Allergies  Allergen Reactions  . Penicillins Anaphylaxis and Swelling    Family History  Problem Relation Age of Onset  . Heart disease Father   . Diabetes Mellitus II Sister   . Myasthenia gravis Sister   . Fibrocystic breast disease Sister   . Hypertension Sister   . Diabetes Mellitus II Brother     Prior to Admission medications   Medication Sig Start Date End Date Taking? Authorizing Provider  aspirin 81 MG tablet Take 243 mg by mouth daily. Takes 4 81mg  aspirin daily.   Yes [provider]  atorvastatin (LIPITOR) 20 MG tablet Take 1 tablet by mouth daily. 03/09/17  Yes [provider]  fluticasone furoate-vilanterol (BREO ELLIPTA) 200-25 MCG/INH AEPB Inhale 1 puff into the lungs daily. 12/11/16  Yes Eustaquio BoydenGutierrez, Javier, MD  furosemide (LASIX) 40 MG tablet Take 1 tablet (40 mg total) by mouth daily. 10/31/16  Yes Lorre MunroeBaity, Regina W, NP  gabapentin (NEURONTIN) 300 MG capsule Take 1 capsule (300 mg total) by mouth 2 (two) times daily. 11/28/16  Yes Eustaquio BoydenGutierrez, Javier, MD  hydrochlorothiazide (MICROZIDE) 12.5 MG capsule Take 1 capsule by mouth daily. 03/09/17  Yes [provider]  metFORMIN (GLUCOPHAGE) 1000 MG tablet Take 1 tablet (1,000 mg total) by mouth 2 (two) times daily with a meal. 02/14/17  Yes Baity, Salvadore Oxfordegina W, NP  acetaminophen (TYLENOL) 500 MG tablet Take 1 tablet (500 mg total) by mouth every 8 (eight) hours  as needed. Patient not taking: Reported on 03/24/2017 01/05/16   Loura Pardon, NP  albuterol (PROVENTIL) (2.5 MG/3ML) 0.083% nebulizer solution Take 3 mLs (2.5 mg total) by nebulization every 6 (six) hours as needed for wheezing or shortness of breath. 06/29/16   Lorre Munroe, NP  Elastic Bandages & Supports (MEDICAL COMPRESSION SOCKS) MISC 1 each by Does not apply route daily. 01/05/16   Krebs, Laurel Dimmer, NP  predniSONE (DELTASONE) 10 MG tablet 40 mg po daily for 1 day, 30 mg po daily  for 1 day, 20 mg po daily for 1 day, 10 mg po daily for 1 day. Patient not taking: Reported on 03/24/2017 03/17/17   Shaune Pollack, MD   Physical Exam: Vitals:   03/26/17 0402 03/26/17 0751 03/26/17 0810 03/26/17 1027  BP: (!) 113/56   137/73  Pulse: 66   77  Resp: 18   (!) 26  Temp: 98 F (36.7 C)   98.6 F (37 C)  TempSrc: Oral   Oral  SpO2: 95% (!) 86% 94% 90%  Weight: (!) 148.2 kg (326 lb 12.8 oz)     Height:        Wt Readings from Last 3 Encounters:  03/26/17 (!) 148.2 kg (326 lb 12.8 oz)  03/15/17 (!) 146.5 kg (323 lb)  02/01/17 (!) 153.8 kg (339 lb)    General:  Appears calm and comfortable Eyes: PERRL, normal lids, irises & conjunctiva ENT: grossly normal hearing, lips & tongue Neck: no LAD, masses or thyromegaly Cardiovascular: RRR, no m/r/g. No LE edema. Respiratory: rhonchi noted bilaterally scattered. Normal respiratory effort. Abdomen: soft, nontender Skin: no rash or induration seen on limited exam Musculoskeletal: grossly normal tone BUE/BLE Psychiatric: grossly normal mood and affect Neurologic: grossly non-focal.          Labs on Admission:  Basic Metabolic Panel:  Recent Labs Lab 03/24/17 1514 03/25/17 0205  NA 140 137  K 4.2 5.3*  CL 100* 96*  CO2 32 31  GLUCOSE 234* 242*  BUN 16 15  CREATININE 0.63 0.67  CALCIUM 8.6* 8.6*   Liver Function Tests:  Recent Labs Lab 03/25/17 0205  AST 19  ALT 22  ALKPHOS 46  BILITOT 0.9  PROT 7.8  ALBUMIN 4.1   No results for input(s): LIPASE, AMYLASE in the last 168 hours. No results for input(s): AMMONIA in the last 168 hours. CBC:  Recent Labs Lab 03/24/17 1514 03/25/17 0205  WBC 9.7 9.9  HGB 14.2 14.7  HCT 43.1 45.1  MCV 88.7 89.8  PLT 175 163   Cardiac Enzymes:  Recent Labs Lab 03/24/17 1514 03/24/17 2055 03/25/17 0205 03/25/17 0836  TROPONINI 0.04* 0.03* 0.03* 0.03*    BNP (last 3 results)  Recent Labs  11/28/16 1714 03/15/17 1210 03/24/17 1618  BNP 65.8 200.0*  242.0*    ProBNP (last 3 results) No results for input(s): PROBNP in the last 8760 hours.  CBG:  Recent Labs Lab 03/25/17 1128 03/25/17 1640 03/25/17 2017 03/25/17 2153 03/26/17 0738  GLUCAP 341* 474* 463* 429* 109*    Radiological Exams on Admission: Ct Angio Chest Pe W Or Wo Contrast  Addendum Date: 03/24/2017   ADDENDUM REPORT: 03/24/2017 19:32 ADDENDUM: Mild dilatation of the ascending aorta measuring up to 4.5 cm. Ascending thoracic aortic aneurysm. Recommend semi-annual imaging followup by CTA or MRA and referral to cardiothoracic surgery if not already obtained. This recommendation follows 2010 ACCF/AHA/AATS/ACR/ASA/SCA/SCAI/SIR/STS/SVM Guidelines for the Diagnosis and Management of Patients With Thoracic Aortic Disease. Circulation. 2010;  121: Z610-R604 Electronically Signed   By: Elgie Collard M.D.   On: 03/24/2017 19:32   Result Date: 03/24/2017 CLINICAL DATA:  61 year old male with shortness of breath. EXAM: CT ANGIOGRAPHY CHEST WITH CONTRAST TECHNIQUE: Multidetector CT imaging of the chest was performed using the standard protocol during bolus administration of intravenous contrast. Multiplanar CT image reconstructions and MIPs were obtained to evaluate the vascular anatomy. CONTRAST:  100 cc Isovue 370 COMPARISON:  Chest radiograph dated 03/24/2017 FINDINGS: Cardiovascular: There is moderate cardiomegaly. No pericardial effusion. There is mild atherosclerotic calcification of the thoracic aorta. Mildly dilated ascending aorta measuring up to 4.5 cm in greatest axial diameter. The origins of the great vessels of the aortic arch appear patent. Mild dilatation of the main pulmonary trunk suggestive of underlying pulmonary hypertension. Evaluation of the pulmonary artery is is limited due to suboptimal opacification of the peripheral branches. No large or central pulmonary artery embolus identified. V/Q scan may provide better evaluation if there is high clinical concern for  acute PE. Mediastinum/Nodes: There is no hilar or mediastinal adenopathy. Esophagus and the thyroid gland appear unremarkable. No mediastinal fluid collection. Lungs/Pleura: There are bilateral upper lobe streaky densities as well as a more focal area of ground-glass opacity in the left upper lobe. Findings most consistent with an infectious process. Clinical correlation is recommended. Bilateral upper lobe linear atelectasis/scarring noted. There is no pleural effusion or pneumothorax. The central airways are patent. Upper Abdomen:  No acute findings. Musculoskeletal: No chest wall abnormality. No acute or significant osseous findings. Review of the MIP images confirms the above findings. IMPRESSION: 1. No CT evidence of central pulmonary artery embolus. V/Q scan may provide better evaluation if there is high clinical concern for acute PE. 2. Bilateral upper lobe streaky and airspace densities concerning for infiltrate. Clinical correlation recommended. 3. Cardiomegaly. Electronically Signed: By: Elgie Collard M.D. On: 03/24/2017 18:52   Dg Chest Portable 1 View  Result Date: 03/24/2017 CLINICAL DATA:  Patient reports SOB onset today. Reports recent hospitalization for hypoxia, was discharged without finding cause of hypoxia. Hx HTN, DM, COPD. Current smoker. EXAM: PORTABLE CHEST 1 VIEW COMPARISON:  03/15/2017 FINDINGS: The heart is enlarged and stable in configuration. There is pulmonary vascular prominence without overt edema. There is mild atelectasis in the left mid lung zone. IMPRESSION: 1. Cardiomegaly and pulmonary vascular congestion. 2. Left mid lung zone atelectasis. Electronically Signed   By: Norva Pavlov M.D.   On: 03/24/2017 15:16    EKG: Independently reviewed.  Assessment/Plan Principal Problem:   Acute respiratory failure (HCC) Active Problems:   Type 2 diabetes, controlled, with peripheral neuropathy (HCC)   Severe obstructive sleep apnea   COPD exacerbation  (HCC)   1. Acute on chronic respiratory failure with hypoxia Patient right now is clinically improving We will continue with oxygen therapy as you are Continue with supportive care  2. COPD exacerbation Patient is on steroids which will be continued Continue nebulizer medications Will need outpatient follow-up with pulmonary function tests Smoking cessation counseling  3. Severe OSA Apparent noncompliance with CPAP States that his CPAP device is broken and needs to have it checked out We will follow-up in the office after discharge  4.  Lobar pneumonia Patient started on antibiotics in the form of Levaquin which will be continued Will need follow-up radiological evaluation after discharge current CT is not entirely conclusive    Code Status: Full code DVT Prophylaxis: Compression stockings Family Communication: None Disposition Plan: Home (indicate anticipated LOS)  Time  spent: 70 minutes    I have personally obtained a history, examined the patient, evaluated laboratory and imaging results, formulated the assessment and plan and placed orders.  The Patient requires high complexity decision making for assessment and support.    Yevonne Pax, MD [facility] Pulmonary Critical Care Medicine Sleep Medicine

## 2017-03-26 NOTE — Progress Notes (Signed)
Patient ID: Jonathon Graham, male   DOB: 02-Jul-1955, 61 y.o.   MRN: [id number]  [employer] PROGRESS NOTE  Jonathon Graham MWN:[id number] DOB: 1956-01-20 DOA: 03/24/2017 PCP: Lorre Munroe, NP  HPI/Subjective: coughing and wheezing +, somewhat better than yesterday per patient  Objective: Vitals:   03/26/17 1027 03/26/17 1659  BP: 137/73 111/62  Pulse: 77 72  Resp: (!) 26 18  Temp: 98.6 F (37 C) 97.8 F (36.6 C)  SpO2: 90% 95%    Filed Weights   03/24/17 1453 03/25/17 0445 03/26/17 0402  Weight: (!) 151 kg (333 lb) (!) 151.6 kg (334 lb 3.2 oz) (!) 148.2 kg (326 lb 12.8 oz)    ROS: Review of Systems  Constitutional: Negative for chills and fever.  Eyes: Negative for blurred vision.  Respiratory: Positive for cough, shortness of breath and wheezing.   Cardiovascular: Negative for chest pain.  Gastrointestinal: Negative for abdominal pain, constipation, diarrhea, nausea and vomiting.  Genitourinary: Negative for dysuria.  Musculoskeletal: Negative for joint pain.  Neurological: Negative for dizziness and headaches.   Exam: Physical Exam  Constitutional: He is oriented to person, place, and time.  HENT:  Nose: No mucosal edema.  Mouth/Throat: No oropharyngeal exudate or posterior oropharyngeal edema.  Eyes: Pupils are equal, round, and reactive to light. Conjunctivae, EOM and lids are normal.  Neck: No JVD present. Carotid bruit is not present. No edema present. No thyroid mass and no thyromegaly present.  Cardiovascular: S1 normal, S2 normal and normal heart sounds.  Exam reveals no gallop.   No murmur heard. Pulses:      Dorsalis pedis pulses are 2+ on the right side, and 2+ on the left side.  Respiratory: No respiratory distress. He has decreased breath sounds in the right middle field, the right lower field, the left middle field and the left lower field. He has wheezes in the right middle field, the right lower field, the left middle field and the left lower  field. He has no rhonchi. He has no rales.  GI: Soft. Bowel sounds are normal. There is no tenderness.  Musculoskeletal:       Right ankle: He exhibits swelling.       Left ankle: He exhibits swelling.  Lymphadenopathy:    He has no cervical adenopathy.  Neurological: He is alert and oriented to person, place, and time. No cranial nerve deficit.  Skin: Skin is warm. No rash noted. Nails show no clubbing.  Psychiatric: He has a normal mood and affect.      Data Reviewed: Basic Metabolic Panel:  Recent Labs Lab 03/24/17 1514 03/25/17 0205  NA 140 137  K 4.2 5.3*  CL 100* 96*  CO2 32 31  GLUCOSE 234* 242*  BUN 16 15  CREATININE 0.63 0.67  CALCIUM 8.6* 8.6*   Liver Function Tests:  Recent Labs Lab 03/25/17 0205  AST 19  ALT 22  ALKPHOS 46  BILITOT 0.9  PROT 7.8  ALBUMIN 4.1   CBC:  Recent Labs Lab 03/24/17 1514 03/25/17 0205  WBC 9.7 9.9  HGB 14.2 14.7  HCT 43.1 45.1  MCV 88.7 89.8  PLT 175 163   Cardiac Enzymes:  Recent Labs Lab 03/24/17 1514 03/24/17 2055 03/25/17 0205 03/25/17 0836  TROPONINI 0.04* 0.03* 0.03* 0.03*   BNP (last 3 results)  Recent Labs  11/28/16 1714 03/15/17 1210 03/24/17 1618  BNP 65.8 200.0* 242.0*     CBG:  Recent Labs Lab 03/25/17 2017 03/25/17 2153 03/26/17 0738 03/26/17 1156 03/26/17  1700  GLUCAP 463* 429* 109* 294* 323*     Studies: Ct Angio Chest Pe W Or Wo Contrast  Addendum Date: 03/24/2017   ADDENDUM REPORT: 03/24/2017 19:32 ADDENDUM: Mild dilatation of the ascending aorta measuring up to 4.5 cm. Ascending thoracic aortic aneurysm. Recommend semi-annual imaging followup by CTA or MRA and referral to cardiothoracic surgery if not already obtained. This recommendation follows 2010 ACCF/AHA/AATS/ACR/ASA/SCA/SCAI/SIR/STS/SVM Guidelines for the Diagnosis and Management of Patients With Thoracic Aortic Disease. Circulation. 2010; 121: O962-X528: [id number] Electronically Signed   By: Elgie CollardArash  Radparvar M.D.   On:  03/24/2017 19:32   Result Date: 03/24/2017 CLINICAL DATA:  61 year old male with shortness of breath. EXAM: CT ANGIOGRAPHY CHEST WITH CONTRAST TECHNIQUE: Multidetector CT imaging of the chest was performed using the standard protocol during bolus administration of intravenous contrast. Multiplanar CT image reconstructions and MIPs were obtained to evaluate the vascular anatomy. CONTRAST:  100 cc Isovue 370 COMPARISON:  Chest radiograph dated 03/24/2017 FINDINGS: Cardiovascular: There is moderate cardiomegaly. No pericardial effusion. There is mild atherosclerotic calcification of the thoracic aorta. Mildly dilated ascending aorta measuring up to 4.5 cm in greatest axial diameter. The origins of the great vessels of the aortic arch appear patent. Mild dilatation of the main pulmonary trunk suggestive of underlying pulmonary hypertension. Evaluation of the pulmonary artery is is limited due to suboptimal opacification of the peripheral branches. No large or central pulmonary artery embolus identified. V/Q scan may provide better evaluation if there is high clinical concern for acute PE. Mediastinum/Nodes: There is no hilar or mediastinal adenopathy. Esophagus and the thyroid gland appear unremarkable. No mediastinal fluid collection. Lungs/Pleura: There are bilateral upper lobe streaky densities as well as a more focal area of ground-glass opacity in the left upper lobe. Findings most consistent with an infectious process. Clinical correlation is recommended. Bilateral upper lobe linear atelectasis/scarring noted. There is no pleural effusion or pneumothorax. The central airways are patent. Upper Abdomen:  No acute findings. Musculoskeletal: No chest wall abnormality. No acute or significant osseous findings. Review of the MIP images confirms the above findings. IMPRESSION: 1. No CT evidence of central pulmonary artery embolus. V/Q scan may provide better evaluation if there is high clinical concern for acute PE.  2. Bilateral upper lobe streaky and airspace densities concerning for infiltrate. Clinical correlation recommended. 3. Cardiomegaly. Electronically Signed: By: Elgie CollardArash  Radparvar M.D. On: 03/24/2017 18:52    Scheduled Meds: . aspirin EC  81 mg Oral Daily  . atorvastatin  20 mg Oral Daily  . budesonide (PULMICORT) nebulizer solution  0.5 mg Nebulization BID  . docusate sodium  100 mg Oral BID  . enoxaparin (LOVENOX) injection  40 mg Subcutaneous Q12H  . furosemide  40 mg Oral Daily  . gabapentin  300 mg Oral BID  . hydrochlorothiazide  12.5 mg Oral Daily  . insulin aspart  0-15 Units Subcutaneous TID WC  . insulin aspart  0-5 Units Subcutaneous QHS  . insulin glargine  20 Units Subcutaneous QHS  . ipratropium-albuterol  3 mL Nebulization QID  . levofloxacin  750 mg Oral Daily  . methylPREDNISolone (SOLU-MEDROL) injection  40 mg Intravenous Daily  . pantoprazole  40 mg Oral BID AC  . sodium chloride flush  3 mL Intravenous Q12H    Assessment/Plan:  1. Acute hypoxic respiratory failure.  Patient currently on 4 L of oxygen.  Management is working on to get him CPAP/nebulizer/oxygen at home 2. COPD exacerbation.  Continue Solu-Medrol.  DuoNeb nebulizer solution and budesonide nebulizers.  Patient recently  in the hospital for the same reason. 3. Pneumonia seen on CT scan of the chest. continue Levaquin  4. Sleep apnea and morbid obesity.  CPAP at night 5. Type 2 diabetes mellitus on sliding scale insulin 6. Essential hypertension continue hydrochlorothiazide 12.5 mg oral daily 7. Edema of the lower extremities: Continue 40 mg oral Lasix daily 8.   Tobacco abuse.  Smoking cessation counseling done 4 minutes by me.  Patient does not want a nicotine patch and would like to go cold [place].  Code Status:     Code Status Orders        Start     Ordered   03/24/17 2035  Full code  Continuous     03/24/17 2034    Code Status History    Date Active Date Inactive Code Status Order ID  Comments User Context   03/15/2017  3:47 PM 03/17/2017  5:30 PM Full Code [id number]  Shaune Pollack, MD Inpatient   11/20/2016  3:44 AM 11/22/2016  4:30 PM Full Code [id number]  Hugelmeyer, Jon Gills, DO Inpatient      Disposition Plan: May be in next 1-2 days pending on clinical condition  Time spent: 25 minutes  Richards Pherigo [employer]

## 2017-03-26 NOTE — Care Management (Signed)
Patient does not have  home oxygen.  He has had a cpap in the home "but I sent it back because I was having to pay 100 dollars a month.  None of my friends had to pay that much.  He is not able.  Says a friend "gave me his machine" but I do not know what the settings should be.  Patient is not able to provide information regarding whether he has had a recent sleep study.  Obtained order for overnight oximetry on room air. Review of care everywhere-   Found sleep study results from 03/2013 at [facility] and [facility].  Informed by Pulmonology office that Sleep Study results are good for 10 years and office has no issues obtaining cpaps with sleep study results that are over 59 years old.  [facility] maintains needs sleep study results within 1 year.  Spoke with patient who will have the cpap machine he has at home brought to hospital to see if it is operational and if machine can be set with current settings. Cardiopulmonary will assess this situation 10/30 when the machine is brought to hospital.  CM will reach out to [facility] regarding patient's previous cpap.  Patient thought it was provided by [facility] but agency has no record

## 2017-03-26 NOTE — Progress Notes (Signed)
Notified MD of bs of 429. Orders placed. Will continue to monitor and assesss.

## 2017-03-26 NOTE — Plan of Care (Signed)
Problem: Respiratory: Goal: Levels of oxygenation will improve Outcome: Not Progressing Pt remains on O2 @4  liters, refusing cpap at night

## 2017-03-26 NOTE — Progress Notes (Signed)
Inpatient Diabetes Program Recommendations  AACE/ADA: New Consensus Statement on Inpatient Glycemic Control (2015)  Target Ranges:  Prepandial:   less than 140 mg/dL      Peak postprandial:   less than 180 mg/dL (1-2 hours)      Critically ill patients:  140 - 180 mg/dL   Results for Jonathon Graham, Jonathon Graham (MRN [id number]) as of 03/26/2017 11:38  Ref. Range 03/25/2017 07:35 03/25/2017 11:28 03/25/2017 16:40 03/25/2017 20:17 03/25/2017 21:53 03/26/2017 07:38  Glucose-Capillary Latest Ref Range: 65 - 99 mg/dL 086209 (H) 578341 (H) 469474 (H) 463 (H) 429 (H) 109 (H)   Review of Glycemic Control  Diabetes history: DM2 Outpatient Diabetes medications: Metformin 1000 mg BID Current orders for Inpatient glycemic control: Lantus 20 units QHS, Novolog 0-9 units TID with meals, Novolog 0-15 units QHS  Inpatient Diabetes Program Recommendations: Correction (SSI): Please consider discontinuing Novolog 0-9 units TID with meals and adjusting frequency of Novolog 0-15 units to ACHS.  Diet: Added Carb Modified to low sodium diet.  NOTE: Noted steroids have been decreased and fasting glucose 109 mg/dl this morning after getting Lantus 20 units last night at bedtime. Currently 2 different Novolog correction scales ordered as noted above. Recommend discontinuing Novolog 0-9 units scale and modifying Novolog 0-15 units to ACHS.  Thanks, Orlando PennerMarie Mylena Sedberry, RN, MSN, CDE Diabetes Coordinator Inpatient Diabetes Program [phone] (Team Pager from 8am to 5pm)

## 2017-03-27 LAB — BASIC METABOLIC PANEL
Anion gap: 8 (ref 5–15)
BUN: 28 mg/dL — ABNORMAL HIGH (ref 6–20)
CO2: 40 mmol/L — ABNORMAL HIGH (ref 22–32)
Calcium: 8.5 mg/dL — ABNORMAL LOW (ref 8.9–10.3)
Chloride: 91 mmol/L — ABNORMAL LOW (ref 101–111)
Creatinine, Ser: 0.82 mg/dL (ref 0.61–1.24)
GFR calc Af Amer: >60 mL/min (ref >60)
GFR calc non Af Amer: >60 mL/min (ref >60)
Glucose, Bld: 144 mg/dL — ABNORMAL HIGH (ref 65–99)
Potassium: 4 mmol/L (ref 3.5–5.1)
Sodium: 139 mmol/L (ref 135–145)

## 2017-03-27 LAB — GLUCOSE, CAPILLARY
Glucose-Capillary: 119 mg/dL — ABNORMAL HIGH (ref 65–99)
Glucose-Capillary: 190 mg/dL — ABNORMAL HIGH (ref 65–99)
Glucose-Capillary: 271 mg/dL — ABNORMAL HIGH (ref 65–99)

## 2017-03-27 LAB — CBC
HCT: 44.4 % (ref 40.0–52.0)
Hemoglobin: 14 g/dL (ref 13.0–18.0)
MCH: 28.7 pg (ref 26.0–34.0)
MCHC: 31.7 g/dL — ABNORMAL LOW (ref 32.0–36.0)
MCV: 90.7 fL (ref 80.0–100.0)
Platelets: 161 10*3/uL (ref 150–440)
RBC: 4.89 MIL/uL (ref 4.40–5.90)
RDW: 16.1 % — ABNORMAL HIGH (ref 11.5–14.5)
WBC: 12.2 10*3/uL — ABNORMAL HIGH (ref 3.8–10.6)

## 2017-03-27 MED ORDER — PREDNISONE 10 MG (21) PO TBPK: ORAL_TABLET | ORAL | 0 refills | Status: DC

## 2017-03-27 MED ORDER — LEVOFLOXACIN 750 MG PO TABS: 750.0000 mg | ORAL_TABLET | Freq: Every day | ORAL | 0 refills | Status: DC

## 2017-03-27 NOTE — Progress Notes (Signed)
Patient is discharge home on 02 in a stable condition , summary and f/u care given , verbalized understanding.

## 2017-03-27 NOTE — Care Management (Signed)
patient has qualified for nocturnal oxygen.  Primary nurse is going to assess oxygen sats to determine if patient will qualify for continuous oxygen because has been complaining of shortness of breath

## 2017-03-27 NOTE — Care Management (Addendum)
Patient wishes to follow up with Dr Freda MunroSaadat Khan for pulmonology.  Requested unit secretary to make appointment for next week.  Patient did bring in the cpap that a friend gave him and will defer that settings be imitated by pulmonology. Patient also qualified for continuous oxygen.  He is requesting the smallest unit possible initially so he can return to work. Has a home nebulizer.  Patient says he is going to consider filing for disability. Updated attending of need for home oxygen order.  Use of nebulizers and IV steroids has not resolved hypoxia.  Referral for oxygen called to [facility].  Patient does not meet home bound criteria for home health follow up.

## 2017-03-27 NOTE — Discharge Instructions (Signed)

## 2017-03-27 NOTE — Progress Notes (Addendum)
[employer] - [place] at [facility]        Jonathon PaulaKeith Graham was admitted to the Hospital on 03/24/2017 and Discharged  03/27/2017 and should be excused from work  for 3 days starting 03/24/2017 , may return to work without any restrictions 03/28/2017.  Jonathon LovettVipul Graham Stephen M.D on 03/27/2017,at 2:58 PM  [employer] - [place] at [facility]    Office  [phone]

## 2017-03-27 NOTE — Progress Notes (Signed)
Inpatient Diabetes Program Recommendations  AACE/ADA: New Consensus Statement on Inpatient Glycemic Control (2015)  Target Ranges:  Prepandial:   less than 140 mg/dL      Peak postprandial:   less than 180 mg/dL (1-2 hours)      Critically ill patients:  140 - 180 mg/dL   Results for Lora PaulaROBERTS, Johnpatrick (MRN [id number]) as of 03/27/2017 09:52  Ref. Range 03/26/2017 07:38 03/26/2017 11:56 03/26/2017 17:00 03/26/2017 21:09 03/27/2017 07:58  Glucose-Capillary Latest Ref Range: 65 - 99 mg/dL 782109 (H) 956294 (H) 213323 (H) 288 (H) 119 (H)   Review of Glycemic Control  Diabetes history: DM2 Outpatient Diabetes medications: Metformin 1000 mg BID Current orders for Inpatient glycemic control: Lantus 20 units QHS, Novolog 0-15 units TID with meals, Novolog 0-5 units QHS  Inpatient Diabetes Program Recommendations: Insulin-Meal Coverage: Post prandial glucose is consistently elevated. If steroids are continued, please consider ordering Novolog 4 units TID with meals for meal coverage.   Thanks, Orlando PennerMarie Maddyn Lieurance, RN, MSN, CDE Diabetes Coordinator Inpatient Diabetes Program [phone] (Team Pager from 8am to 5pm)

## 2017-03-27 NOTE — Progress Notes (Signed)
SATURATION QUALIFICATIONS: (This note is used to comply with regulatory documentation for home oxygen)  Patient Saturations on Room Air at Rest =86  Patient Saturations on Room Air while Ambulating = 84  Patient Saturations on2 Liters of oxygen while Ambulating = 93%  Please briefly explain why patient needs home oxygen: 

## 2017-03-28 NOTE — Care Management (Signed)
CM was informed [facility] needed the signed oxygen order faxed.  CM faxed the order and received confirmation of fax

## 2017-03-28 NOTE — Discharge Summary (Signed)
5        [facility] [place] [facility]   PATIENT NAME: Jonathon Graham    MR#:  [id number]  DATE OF BIRTH:  05-31-1955  DATE OF ADMISSION:  03/24/2017   ADMITTING PHYSICIAN: Marguarite Arbour, MD  DATE OF DISCHARGE: 03/27/2017  7:11 PM  PRIMARY CARE PHYSICIAN: Lorre Munroe, NP   ADMISSION DIAGNOSIS:  SOB (shortness of breath) [R06.02] Hypoxia [R09.02] DISCHARGE DIAGNOSIS:  Principal Problem:   Acute respiratory failure (HCC) Active Problems:   Type 2 diabetes, controlled, with peripheral neuropathy (HCC)   Severe obstructive sleep apnea   COPD exacerbation (HCC)  SECONDARY DIAGNOSIS:   Past Medical History:  Diagnosis Date  . Arthritis   . Benign prostatic hyperplasia (BPH) with urinary urgency    nodular  . COPD (chronic obstructive pulmonary disease) (HCC)   . Depression   . Diabetes mellitus without complication (HCC)   . Essential hypertension   . Fibromyalgia, primary   . Hyperlipidemia   . Hypertension   . Obesity    HOSPITAL COURSE:   1. Acute hypoxic respiratory failure.  Patient will need oxygen at discharge.  care Management is working on to get him CPAP/nebulizer/oxygen at home 2. COPD exacerbation: Improved with steroids and nebulizer breathing treatment 3. Pneumonia seen on CT scan of the chest treated with antibiotics:  4.   Tobacco abuse.  Smoking cessation counseling done 4 minutes   DISCHARGE CONDITIONS:  stable CONSULTS OBTAINED:  Treatment Team:  Yevonne Pax, MD DRUG ALLERGIES:   Allergies  Allergen Reactions  . Penicillins Anaphylaxis and Swelling   DISCHARGE MEDICATIONS:   Allergies as of 03/27/2017      Reactions   Penicillins Anaphylaxis, Swelling      Medication List    STOP taking these medications   predniSONE 10 MG tablet Commonly known as:  DELTASONE Replaced by:  predniSONE 10 MG (21) Tbpk tablet     TAKE these medications   acetaminophen 500 MG tablet Commonly known as:   TYLENOL Take 1 tablet (500 mg total) by mouth every 8 (eight) hours as needed.   albuterol (2.5 MG/3ML) 0.083% nebulizer solution Commonly known as:  PROVENTIL Take 3 mLs (2.5 mg total) by nebulization every 6 (six) hours as needed for wheezing or shortness of breath.   aspirin 81 MG tablet Take 243 mg by mouth daily. Takes 4 81mg  aspirin daily.   atorvastatin 20 MG tablet Commonly known as:  LIPITOR Take 1 tablet by mouth daily.   fluticasone furoate-vilanterol 200-25 MCG/INH Aepb Commonly known as:  BREO ELLIPTA Inhale 1 puff into the lungs daily.   furosemide 40 MG tablet Commonly known as:  LASIX Take 1 tablet (40 mg total) by mouth daily.   gabapentin 300 MG capsule Commonly known as:  NEURONTIN Take 1 capsule (300 mg total) by mouth 2 (two) times daily.   hydrochlorothiazide 12.5 MG capsule Commonly known as:  MICROZIDE Take 1 capsule by mouth daily.   levofloxacin 750 MG tablet Commonly known as:  LEVAQUIN Take 1 tablet (750 mg total) by mouth daily.   Medical Compression Socks Misc 1 each by Does not apply route daily.   metFORMIN 1000 MG tablet Commonly known as:  GLUCOPHAGE Take 1 tablet (1,000 mg total) by mouth 2 (two) times daily with a meal.   predniSONE 10 MG (21) Tbpk tablet Commonly known as:  STERAPRED UNI-PAK 21 TAB Start 60 mg po daily, taper 10 mg daily until done Replaces:  predniSONE 10 MG tablet        DISCHARGE INSTRUCTIONS:   DIET:  Regular diet DISCHARGE CONDITION:  Stable ACTIVITY:  Activity as tolerated OXYGEN:  Home Oxygen: Yes.    Oxygen Delivery: 2 liters/min via Patient connected to nasal cannula oxygen DISCHARGE LOCATION:  home   If you experience worsening of your admission symptoms, develop shortness of breath, life threatening emergency, suicidal or homicidal thoughts you must seek medical attention immediately by calling 911 or calling your MD immediately  if symptoms less severe.  You Must read complete  instructions/literature along with all the possible adverse reactions/side effects for all the Medicines you take and that have been prescribed to you. Take any new Medicines after you have completely understood and accpet all the possible adverse reactions/side effects.   Please note  You were cared for by a hospitalist during your hospital stay. If you have any questions about your discharge medications or the care you received while you were in the hospital after you are discharged, you can call the unit and asked to speak with the hospitalist on call if the hospitalist that took care of you is not available. Once you are discharged, your primary care physician will handle any further medical issues. Please note that NO REFILLS for any discharge medications will be authorized once you are discharged, as it is imperative that you return to your primary care physician (or establish a relationship with a primary care physician if you do not have one) for your aftercare needs so that they can reassess your need for medications and monitor your lab values.    On the day of Discharge:  VITAL SIGNS:  Blood pressure (!) 110/55, pulse 84, temperature 98.3 F (36.8 C), temperature source Oral, resp. rate 18, height 5\' 8"  (1.727 m), weight (!) 151 kg (333 lb), SpO2 94 %. PHYSICAL EXAMINATION:  GENERAL:  61 y.o.-year-old patient lying in the bed with no acute distress.  EYES: Pupils equal, round, reactive to light and accommodation. No scleral icterus. Extraocular muscles intact.  HEENT: Head atraumatic, normocephalic. Oropharynx and nasopharynx clear.  NECK:  Supple, no jugular venous distention. No thyroid enlargement, no tenderness.  LUNGS: Normal breath sounds bilaterally, no wheezing, rales,rhonchi or crepitation. No use of accessory muscles of respiration.  CARDIOVASCULAR: S1, S2 normal. No murmurs, rubs, or gallops.  ABDOMEN: Soft, non-tender, non-distended. Bowel sounds present. No organomegaly or  mass.  EXTREMITIES: No pedal edema, cyanosis, or clubbing.  NEUROLOGIC: Cranial nerves II through XII are intact. Muscle strength 5/5 in all extremities. Sensation intact. Gait not checked.  PSYCHIATRIC: The patient is alert and oriented x 3.  SKIN: No obvious rash, lesion, or ulcer.  DATA REVIEW:   CBC  Recent Labs Lab 03/27/17 0451  WBC 12.2*  HGB 14.0  HCT 44.4  PLT 161    Chemistries   Recent Labs Lab 03/25/17 0205 03/27/17 0451  NA 137 139  K 5.3* 4.0  CL 96* 91*  CO2 31 40*  GLUCOSE 242* 144*  BUN 15 28*  CREATININE 0.67 0.82  CALCIUM 8.6* 8.5*  AST 19  --   ALT 22  --   ALKPHOS 46  --   BILITOT 0.9  --      Follow-up Information    Yevonne PaxKhan, Saadat A, MD. Go on 04/02/2017.   Specialty:  Internal Medicine Why:  @[email or web address] (pulmonology) Contact information: [address] [place] [address] [phone]        Lorre MunroeBaity, Regina W, NP. Schedule  an appointment as soon as possible for a visit in 1 week(s).   Specialty:  Internal Medicine Contact information: [address] [place] [address] [phone]        Inc[facility] Follow up.   Why:  Home oxygen Contact information: [address] [place] [address] [phone]           Management plans discussed with the patient, family and they are in agreement.  CODE STATUS: Prior   TOTAL TIME TAKING CARE OF THIS PATIENT: 45 minutes.    Delfino Lovett M.D on 03/28/2017 at 8:00 PM  Between 7am to 6pm - Pager - [phone]  After 6pm go to [email or web address] - Social research officer, government  [facility]  Office  [phone]  CC: Primary care physician; Lorre Munroe, NP   Note: This dictation was prepared with Dragon dictation along with smaller phrase technology. Any transcriptional errors that result from this process are unintentional.

## 2017-03-29 ENCOUNTER — Other Ambulatory Visit: Payer: Self-pay | Admitting: *Deleted

## 2017-03-29 ENCOUNTER — Ambulatory Visit: Payer: Self-pay | Admitting: Internal Medicine

## 2017-03-29 DIAGNOSIS — Z0289 Encounter for other administrative examinations: Secondary | ICD-10-CM

## 2017-03-29 DIAGNOSIS — G4730 Sleep apnea, unspecified: Secondary | ICD-10-CM

## 2017-03-30 ENCOUNTER — Encounter: Payer: Self-pay | Admitting: Family Medicine

## 2017-03-30 ENCOUNTER — Ambulatory Visit (INDEPENDENT_AMBULATORY_CARE_PROVIDER_SITE_OTHER): Payer: BC Managed Care – PPO | Admitting: Family Medicine

## 2017-03-30 VITALS — BP 112/75 | HR 90 | Temp 98.1°F | Wt 329.0 lb

## 2017-03-30 DIAGNOSIS — E1142 Type 2 diabetes mellitus with diabetic polyneuropathy: Secondary | ICD-10-CM | POA: Diagnosis not present

## 2017-03-30 DIAGNOSIS — J439 Emphysema, unspecified: Secondary | ICD-10-CM

## 2017-03-30 DIAGNOSIS — G4733 Obstructive sleep apnea (adult) (pediatric): Secondary | ICD-10-CM

## 2017-03-30 DIAGNOSIS — Z6841 Body Mass Index (BMI) 40.0 and over, adult: Secondary | ICD-10-CM

## 2017-03-30 LAB — COMPREHENSIVE METABOLIC PANEL
ALT: 18 U/L (ref 0–53)
AST: 14 U/L (ref 0–37)
Albumin: 3.7 g/dL (ref 3.5–5.2)
Alkaline Phosphatase: 45 U/L (ref 39–117)
BUN: 26 mg/dL — ABNORMAL HIGH (ref 6–23)
CO2: 44 mEq/L — ABNORMAL HIGH (ref 19–32)
Calcium: 9.9 mg/dL (ref 8.4–10.5)
Chloride: 89 mEq/L — ABNORMAL LOW (ref 96–112)
Creatinine, Ser: 0.87 mg/dL (ref 0.40–1.50)
GFR: 115.56 mL/min (ref >60.00)
Glucose, Bld: 266 mg/dL — ABNORMAL HIGH (ref 70–99)
Potassium: 3.9 mEq/L (ref 3.5–5.1)
Sodium: 139 mEq/L (ref 135–145)
Total Bilirubin: 1 mg/dL (ref 0.2–1.2)
Total Protein: 6.9 g/dL (ref 6.0–8.3)

## 2017-03-30 LAB — CBC
HCT: 47.1 % (ref 39.0–52.0)
Hemoglobin: 15 g/dL (ref 13.0–17.0)
MCHC: 31.8 g/dL (ref 30.0–36.0)
MCV: 91.2 fl (ref 78.0–100.0)
Platelets: 217 10*3/uL (ref 150.0–400.0)
RBC: 5.17 Mil/uL (ref 4.22–5.81)
RDW: 15.6 % — ABNORMAL HIGH (ref 11.5–15.5)
WBC: 10.7 10*3/uL — ABNORMAL HIGH (ref 4.0–10.5)

## 2017-03-30 LAB — TSH: TSH: 1.5 u[IU]/mL (ref 0.35–4.50)

## 2017-03-30 LAB — HEMOGLOBIN A1C: Hgb A1c MFr Bld: 7.6 % — ABNORMAL HIGH (ref 4.6–6.5)

## 2017-03-30 NOTE — Progress Notes (Signed)
Subjective:    Patient ID: Jonathon Graham, male    DOB: 17-Sep-1955, 61 y.o.   MRN: [id number]  HPI This is a 61 yo male, accompanied by his wife, who presents today for hospital follow up. He was admitted 10/27-10/30/18 with acute respiratory failure, COPD exacerbation. Was given antibiotics for pneumonia seen on CT. Discharged home on oxygen 2 liters. Did not bring with him today. Using atrovent and albuterol nebulizer once a day. Blood sugars running 250. Is still on prednisone. At 40 mg. Currently with cough with thick yellow sputum. No fevers.   Diagnosed with severe OSA, was unable to afford monthly charge for CPAP.  Feels very fatigued.   Does not check his blood sugars at home, would like a glucometer. Has never had diabetic education classes.   Was also admitted 10/18- 03/17/17 with hypoxia and COPD exacerbation, was given duoNeb, IV solu medrol.   Past Medical History:  Diagnosis Date  . Arthritis   . Benign prostatic hyperplasia (BPH) with urinary urgency    nodular  . COPD (chronic obstructive pulmonary disease) (HCC)   . Depression   . Diabetes mellitus without complication (HCC)   . Essential hypertension   . Fibromyalgia, primary   . Hyperlipidemia   . Hypertension   . Obesity    No past surgical history on file. Family History  Problem Relation Age of Onset  . Heart disease Father   . Diabetes Mellitus II Sister   . Myasthenia gravis Sister   . Fibrocystic breast disease Sister   . Hypertension Sister   . Diabetes Mellitus II Brother    Social History  Substance Use Topics  . Smoking status: Current Some Day Smoker    Types: Cigarettes    Last attempt to quit: 04/04/2015  . Smokeless tobacco: Never Used     Comment: 3-4 daily  . Alcohol use No       Review of Systems  Constitutional: Positive for fatigue. Negative for fever.  Respiratory: Positive for cough and shortness of breath.   Cardiovascular: Negative for chest pain and leg swelling.       Objective:   Physical Exam  Constitutional: He is oriented to person, place, and time. He appears well-developed and well-nourished. No distress.  Morbidly obese.   HENT:  Head: Normocephalic and atraumatic.  Eyes: Conjunctivae are normal.  Cardiovascular: Normal rate, regular rhythm and normal heart sounds.  Pulmonary/Chest: Effort normal. No respiratory distress. He has no wheezes. He has no rales.  Decreased breath sounds throughout. Pulse ox readings range from 88-92% on RA.   Neurological: He is alert and oriented to person, place, and time.  Skin: Skin is warm and dry. He is not diaphoretic.  Psychiatric: He has a normal mood and affect. His behavior is normal. Judgment and thought content normal.  Vitals reviewed.        BP 112/75 (BP Location: Left Arm, Patient Position: Sitting, Cuff Size: Large)   Pulse 90   Temp 98.1 F (36.7 C) (Oral)   Wt (!) 329 lb (149.2 kg)   SpO2 (!) 85%   BMI 50.02 kg/m  Wt Readings from Last 3 Encounters:  03/30/17 (!) 329 lb (149.2 kg)  03/27/17 (!) 333 lb (151 kg)  03/15/17 (!) 323 lb (146.5 kg)    Assessment & Plan:  1. BMI 50.0-59.9, adult (HCC) - Hemoglobin A1c - TSH  2. Type 2 diabetes, controlled, with peripheral neuropathy (HCC) - glucometer order to pharmacy, needs diabetic education when breathing  stablized - CBC - Comprehensive metabolic panel - Hemoglobin A1c  3. Pulmonary emphysema, unspecified emphysema type (HCC) - discussed need for him to use oxygen at all times  - discussed importance of complete smoking cessation - CBC - Comprehensive metabolic panel - did not have pulmonary follow up on file, had his wife call office and schedule before he left, he was given appointment for 11.5.18  4. OSA (obstructive sleep apnea) - Needs treatment, discussed importance of treatment to help with breathing, fatigue, DM, HTN - Has appointment with pulmonary 04/02/17  - follow up in 1 month  Olean Reeeborah Gessner,  FNP-BC  [facility]  04/02/2017 7:58 AM

## 2017-03-30 NOTE — Progress Notes (Signed)
[facility] Consultation      Assessment and Plan:  61 yo male with sleep apnea, morbid obesity, dyspnea, possible obesity hypoventilation.   Chronic bronchitis.  --With recent exacerbation requiring hospitalization.  --Continue Breo, albuterol as needed.  --Smoking cessation.   Tobacco abuse.  --Discussed the importance of continued smoking cessation which will help with his breathing >3 min.   OSA  -Diagnosis of sleep apnea approximately 2 years ago, he now has his own PAP Device, but has not yet set the pressure and has not started using it. -Previous CPAP was set at 16, will request that his oxygen supply company set his CPAP.  He tells me that his CPAP is currently set at 14, asked that he start using it regardless of whether set or not, as this should give him some benefit.  Insomnia with excessive daytime sleepiness. 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 -He has multiple etiologies for his insomnia and daytime sleepiness, which may include peripheral neuropathy, poor sleep hygiene, anxiety. He may also have some degree of circadian rhythm disorder given his early rising time at 2:30 AM. -Advised him not to take naps during the day, we discussed improvement of sleep hygiene. He may benefit from Ambien or similar medication, however, we will look at his baseline CO2 levels first.  Obesity -Severe morbid obesity with BMI of 51, this may be contributing to obesity hypoventilation syndrome and elevated CO2 levels. -We'll check ABG.  Dyspnea. -Patient is currently on Breo, possibly for asthma. -His dyspnea may be multifactorial from asthma, as well as severe morbid obesity. Discussed the importance of weight loss going forwards. We'll also check a pulmonary function test to look for evidence of restrictive lung disease   Date: 03/30/2017  MRN# [id number] Jonathon Graham May 13, 1956    Jonathon Graham is a 61 y.o. old male seen in consultation for chief complaint of:    Chief Complaint  Patient presents with  . Sleep Apnea  . Asthma    HPI:   The patient is a 61 yo male with a history of OSA, severe daytime sleepiness. At last visit he noted that he could not afford CPAP therefore it was taken away. His brother then bought him a machine but he did not know what pressure to set it at. Upon review of his old sleep study he was advised to set it at 16 cm H2O. He was also noted to have insomnia and on several medications which could be causing daytime sleepiness. He was also admitted to the hospital for COPD exacerbation and discharged on oxygen. However he is not allowed to use oxygen at work.   He notes that he takes Breo once occasionally when he feels that he needs it, ditto for albuterol. He uses it only when he is winded and that happens about twice per week. He has another 2 days of prednisone daily.  He has a CPAP machine but was not able to set it on his own, his insurance will be out soon.  He stopped smoking about 2 weeks ago and feels a bit better.  His medications include Ultram, Neurontin, phentermine.  He takes his medicine around 8 pm, he goes to bed at 9:30pm. He watches tv in bed until he falls asleep which will vary a lot, but falls asleep at 10:30 pm. Wakes at 2:30 am to go to work, he works as a Best boy at [employer].  He gets home from work at 2:30 pm. He takes  naps 2 or 3 days per week of about 2 hours.  On days that he is now working, his sleep habits are the same. He has a lot of issues with pain, his wife is present and says that he has a lot of diabetic pain in his legs.   He frequently getting leg cramps at night, he does not complain of RLS symptoms. He was prescribed Remus Loffler but was worried about taking it with his OSA.   He takes Breo for breathing issues, he is not sure if he has been diagnosed with asthma.   **ABG 02/01/17; 7.39/53/52/31-- chronic hypercapnic and hypoxic respiratory failure.  **Images personally  reviewed, CT chest 11/19/2016; appears lung volume secondary to body habitus with bibasilar atelectasis, otherwise lungs are unremarkable.  Review of outside sleep study 04/17/13; split-night AHI equals 101.4 consistent with very severe sleep apnea, no central apnea noted. Patient was started on CPAP, titrated to a pressure of 16. At this pressure lowest sat was 83%. Patient slept for 51 minutes. REM supine of 9.4 minutes. AHI was 6.4. At CPAP of 16, 8.1. A CPAP of 15. The patient was recommended to use CPAP at 16. Overall this test is insistent with very severe. Obstructive sleep apnea, sleep-related hypoxemia, CPAP was started at 16.  **Blood gas 02/01/17; 7.39/53/52/32.1. Consistent with chronic hypercapnic and hypoxic respiratory failure.  Medication:    Current Outpatient Prescriptions:  .  acetaminophen (TYLENOL) 500 MG tablet, Take 1 tablet (500 mg total) by mouth every 8 (eight) hours as needed., Disp: 30 tablet, Rfl: 0 .  albuterol (PROVENTIL) (2.5 MG/3ML) 0.083% nebulizer solution, Take 3 mLs (2.5 mg total) by nebulization every 6 (six) hours as needed for wheezing or shortness of breath., Disp: 150 mL, Rfl: 6 .  aspirin 81 MG tablet, Take 243 mg by mouth daily. Takes 4 81mg  aspirin daily., Disp: , Rfl:  .  atorvastatin (LIPITOR) 20 MG tablet, Take 1 tablet by mouth daily., Disp: , Rfl:  .  Elastic Bandages & Supports (MEDICAL COMPRESSION SOCKS) MISC, 1 each by Does not apply route daily., Disp: 1 each, Rfl: 0 .  fluticasone furoate-vilanterol (BREO ELLIPTA) 200-25 MCG/INH AEPB, Inhale 1 puff into the lungs daily., Disp: 30 each, Rfl: 1 .  furosemide (LASIX) 40 MG tablet, Take 1 tablet (40 mg total) by mouth daily., Disp: 30 tablet, Rfl: 3 .  gabapentin (NEURONTIN) 300 MG capsule, Take 1 capsule (300 mg total) by mouth 2 (two) times daily., Disp: 180 capsule, Rfl: 3 .  hydrochlorothiazide (MICROZIDE) 12.5 MG capsule, Take 1 capsule by mouth daily., Disp: , Rfl:  .  levofloxacin (LEVAQUIN)  750 MG tablet, Take 1 tablet (750 mg total) by mouth daily., Disp: 3 tablet, Rfl: 0 .  metFORMIN (GLUCOPHAGE) 1000 MG tablet, Take 1 tablet (1,000 mg total) by mouth 2 (two) times daily with a meal., Disp: 180 tablet, Rfl: 1 .  predniSONE (DELTASONE) 10 MG tablet, , Disp: , Rfl:   Current Facility-Administered Medications:  .  ipratropium-albuterol (DUONEB) 0.5-2.5 (3) MG/3ML nebulizer solution 3 mL, 3 mL, Nebulization, Q6H, Krebs, Amy Lauren, NP   Allergies:  Penicillins  Review of Systems: Gen:  Denies  fever, sweats, chills HEENT: Denies blurred vision, double vision. bleeds, sore throat Cvc:  No dizziness, chest pain. Resp:   Denies cough or sputum production, shortness of breath Gi: Denies swallowing difficulty, stomach pain. Gu:  Denies bladder incontinence, burning urine Ext:   No Joint pain, stiffness. Skin: No skin rash,  hives  Endoc:  No polyuria, polydipsia. Psych: No depression, insomnia. Other:  All other systems were reviewed with the patient and were negative other that what is mentioned in the HPI.   Physical Examination:   VS: BP 132/86 (BP Location: Left Arm, Cuff Size: Large)   Pulse 79   Resp 16   Ht 5\' 8"  (1.727 m)   Wt (!) 330 lb (149.7 kg)   SpO2 94%   BMI 50.18 kg/m   General Appearance: No distress  Neuro:without focal findings,  speech normal,  HEENT: PERRLA, EOM intact.   Pulmonary: normal breath sounds, No wheezing.  CardiovascularNormal S1,S2.  No m/r/g.   Abdomen: Benign, Soft, non-tender. Renal:  No costovertebral tenderness  GU:  No performed at this time. Endoc: No evident thyromegaly, no signs of acromegaly. Skin:   warm, no rashes, no ecchymosis  Extremities: normal, no cyanosis, clubbing.  Other findings:    LABORATORY PANEL:   CBC  Recent Labs Lab 03/27/17 0451  WBC 12.2*  HGB 14.0  HCT 44.4  PLT 161    ------------------------------------------------------------------------------------------------------------------  Chemistries   Recent Labs Lab 03/25/17 0205 03/27/17 0451  NA 137 139  K 5.3* 4.0  CL 96* 91*  CO2 31 40*  GLUCOSE 242* 144*  BUN 15 28*  CREATININE 0.67 0.82  CALCIUM 8.6* 8.5*  AST 19  --   ALT 22  --   ALKPHOS 46  --   BILITOT 0.9  --    ------------------------------------------------------------------------------------------------------------------  Cardiac Enzymes  Recent Labs Lab 03/25/17 0836  TROPONINI 0.03*   ------------------------------------------------------------  RADIOLOGY:  No results found.     Thank  you for the consultation and for allowing [facility] to assist in the care of your patient. Our recommendations are noted above.  Please contact us if we can be of further service.   Wells Guileseep Tryniti Laatsch, MD.  Board Certified in Internal Medicine, Pulmonary Medicine, Critical Care Medicine, and Sleep Medicine.  [facility] Office Number: [phone]  Santiago Gladavid Kasa, M.D.  Billy Fischeravid Simonds, M.D  03/30/2017

## 2017-03-30 NOTE — Patient Instructions (Addendum)
Take plain Mucinex as directed on the package to thin your sputum  Drink enough liquids to make your urine light yellow  Avoid drinking beverages with sugar- soda, juice, sweet tea. Avoid sweets, white bread, pasta and rice.  Eat lean proteins- chicken, beef, pork, fish, eggs, nuts, peanut butter Eat lots of non starchy vegetables and 2 servings of fruit daily  Follow up with Rene Kocheregina in 1 month

## 2017-04-02 ENCOUNTER — Telehealth: Payer: Self-pay | Admitting: Internal Medicine

## 2017-04-02 ENCOUNTER — Encounter: Payer: Self-pay | Admitting: *Deleted

## 2017-04-02 ENCOUNTER — Other Ambulatory Visit: Payer: Self-pay | Admitting: Family Medicine

## 2017-04-02 ENCOUNTER — Ambulatory Visit (INDEPENDENT_AMBULATORY_CARE_PROVIDER_SITE_OTHER): Payer: BC Managed Care – PPO | Admitting: Internal Medicine

## 2017-04-02 ENCOUNTER — Encounter: Payer: Self-pay | Admitting: Internal Medicine

## 2017-04-02 ENCOUNTER — Encounter: Payer: Self-pay | Admitting: Family Medicine

## 2017-04-02 VITALS — BP 132/86 | HR 79 | Resp 16 | Ht 68.0 in | Wt 330.0 lb

## 2017-04-02 DIAGNOSIS — E662 Morbid (severe) obesity with alveolar hypoventilation: Secondary | ICD-10-CM | POA: Diagnosis not present

## 2017-04-02 DIAGNOSIS — J4540 Moderate persistent asthma, uncomplicated: Secondary | ICD-10-CM

## 2017-04-02 MED ORDER — BLOOD GLUCOSE MONITOR KIT: PACK | 0 refills | Status: AC

## 2017-04-02 NOTE — Telephone Encounter (Signed)
Left message asking pt to call office I have questions about paperwork  Did pt drop off paperwork or was employer to faxed paperwork? I do not have paperwork can pt have paperwork refaxed

## 2017-04-02 NOTE — Patient Instructions (Addendum)
--  Continue to staying away from smoking.  --Continue using Breo daily. Use albuterol for chest congestion or trouble breathing.  --Use CPAP every day.  --Weight loss will help with breathing.

## 2017-04-03 ENCOUNTER — Ambulatory Visit: Payer: Self-pay | Admitting: *Deleted

## 2017-04-03 NOTE — Telephone Encounter (Signed)
Patient called this morning and will be faxing over short term disability forms. He went back to work this morning but had to leave. Unable to work. Patient needs to know what all he needs to have sent in the get the short term disability started. Please give pt a call. Patient has changed care over to Lockheed MartinDeborah Gessner.

## 2017-04-03 NOTE — Telephone Encounter (Signed)
Left message letting pt know he needs to call his employer and asking them about forms for short term disability

## 2017-04-03 NOTE — Telephone Encounter (Signed)
Heart rate dropped to into the 30's while using a home pulse ox,  while on the phone with patient.911 notified by wife.   Reason for Disposition . Sounds like a life-threatening emergency to the triager  Answer Assessment - Initial Assessment Questions 1. MAIN CONCERN OR SYMPTOM : "What's your main concern?" (e.g., low oxygen level, breathing difficulty) "What question do you have?"     Difficulty breathing 2. ONSET: "When did the  ________  start?"      trying to go back to work 3. OXYGEN THERAPY:    - "Do you currently use home oxygen?" (e.g., yes, no).    - If yes, "What is your oxygen source?" (e.g., O2 tank, O2 concentrator).    - If yes, "How do you get the oxygen?" (e.g., nasal prongs, face mask).    - If yes, "How much oxygen are you supposed to use?" (e.g., 1-2 L )     Home oxygen, O2 concentrator, nasal prongs and on 2 liters 4. PULSE OXIMETER:    - "Do you have a pulse oximeter (pulse ox)?"  (e.g., yes, no)    - If yes, "Where do you place the probe?" (e.g., fingertip, ear lobe)     Has pulse ox, placed on finger 5. O2 MONITORING: "What is the oxygen level (pulse ox reading)?" (e.g., 70-100%) 6: VSS MONITORING "Do you monitor/measure your oxygen level or vital signs (e.g., yes, no, measurements are automatically sent to call center). Document CURRENT and NORMAL BASELINE values if available.     -  O2 SAT: "What is the oxygen level (pulse ox reading)?" (e.g., 70-100%)   -  P: "What is your pulse rate per minute?"   -  RR: "What is your respiratory rate per minute?"     94%, HR 90 does not know 7. BREATHING DIFFICULTY: "Are you having any difficulty breathing?" If so, ask "How bad is it?"  (e.g., none, mild, moderate, severe)   - MILD: No SOB at rest, mild SOB with walking, speaks normally in sentences, able to lie down, no retractions, pulse < 100.   - MODERATE: SOB at rest, SOB with minimal exertion and prefers to sit, cannot lie down flat, speaks in phrases, mild  retractions, audible wheezing, pulse 100-120.   - SEVERE: Very SOB at rest, speaks in single words, struggling to breathe, sitting hunched forward, retractions, pulse > 120      Mild right now but got up to severe earlier today 8. OTHER SYMPTOMS: "Do you have any other symptoms?" (e.g., fever, change in sputum)     Yellow sputum 9. SMOKING: "Do you smoke currently?" (Note: smoking around oxygen is dangerous!)     no  Protocols used: COPD OXYGEN MONITORING AND HYPOXIA-A-AH

## 2017-04-04 NOTE — Telephone Encounter (Signed)
Called and spoke with patient he states he did not go to the ED, EMS gave a breathing treatment and EKG. Patient states that he is currently okay and has no complaints. He will keep his appointment with Debbie tomorrow at 2211. Nothing further needed at this time.

## 2017-04-04 NOTE — Telephone Encounter (Signed)
No longer my patient. Please update PCP to Deboraha Sprangebbie Gessner. Will forward to [place].

## 2017-04-04 NOTE — Telephone Encounter (Signed)
Please call to see if patient went to ED yesterday.

## 2017-04-05 ENCOUNTER — Telehealth: Payer: Self-pay | Admitting: Family Medicine

## 2017-04-05 NOTE — Telephone Encounter (Signed)
Copied from CRM #[id number]. Topic: Quick Communication - Office Called Patient >> Apr 05, 2017 11:33 AM [name], Jonathon wrote: Reason for CRM: Patient returned Jonathon Graham call. Found a note stating the short term disability paperwork was not rec'd in the office. Patient was asked to refax the paperwork to the office at [phone].

## 2017-04-06 ENCOUNTER — Encounter: Payer: Self-pay | Admitting: Family Medicine

## 2017-04-06 ENCOUNTER — Ambulatory Visit: Payer: BC Managed Care – PPO | Admitting: Family Medicine

## 2017-04-06 VITALS — BP 112/70 | HR 86 | Temp 98.5°F | Wt 325.5 lb

## 2017-04-06 DIAGNOSIS — N481 Balanitis: Secondary | ICD-10-CM

## 2017-04-06 DIAGNOSIS — M791 Myalgia, unspecified site: Secondary | ICD-10-CM

## 2017-04-06 DIAGNOSIS — J439 Emphysema, unspecified: Secondary | ICD-10-CM

## 2017-04-06 DIAGNOSIS — G8929 Other chronic pain: Secondary | ICD-10-CM

## 2017-04-06 DIAGNOSIS — E1142 Type 2 diabetes mellitus with diabetic polyneuropathy: Secondary | ICD-10-CM | POA: Diagnosis not present

## 2017-04-06 DIAGNOSIS — G4733 Obstructive sleep apnea (adult) (pediatric): Secondary | ICD-10-CM

## 2017-04-06 DIAGNOSIS — M545 Low back pain: Secondary | ICD-10-CM

## 2017-04-06 LAB — BASIC METABOLIC PANEL
BUN: 18 mg/dL (ref 6–23)
CO2: 36 mEq/L — ABNORMAL HIGH (ref 19–32)
Calcium: 9.8 mg/dL (ref 8.4–10.5)
Chloride: 96 mEq/L (ref 96–112)
Creatinine, Ser: 0.88 mg/dL (ref 0.40–1.50)
GFR: 114.03 mL/min (ref >60.00)
Glucose, Bld: 182 mg/dL — ABNORMAL HIGH (ref 70–99)
Potassium: 4 mEq/L (ref 3.5–5.1)
Sodium: 140 mEq/L (ref 135–145)

## 2017-04-06 LAB — SEDIMENTATION RATE: Sed Rate: 14 mm/hr (ref 0–20)

## 2017-04-06 MED ORDER — GABAPENTIN 300 MG PO CAPS
300.0000 mg | ORAL_CAPSULE | Freq: Three times a day (TID) | ORAL | 1 refills | Status: DC
Start: 2017-04-06 — End: 2017-10-03

## 2017-04-06 MED ORDER — CLOTRIMAZOLE 1 % EX CREA: 1.0000 "application " | TOPICAL_CREAM | Freq: Two times a day (BID) | CUTANEOUS | 0 refills | Status: DC

## 2017-04-06 NOTE — Patient Instructions (Addendum)
Increase gabapentin to three times a day  I have sent a cream to your pharmacy for your penis, please use twice a day  Increase activity as tolerated  Follow up with me in 3 weeks  Balanitis Balanitis is swelling and irritation (inflammation) of the head of the penis (glans penis). The condition may also cause inflammation of the skin around the glans penis (foreskin) in men who have not been circumcised. It may develop because of an infection or another medical condition. Balanitis occurs most often among men who have not had their foreskin removed (uncircumcised men). Balanitis sometimes causes scarring of the penis or foreskin, which can require surgery. Untreated balanitis can increase the risk of penile cancer. What are the causes? Common causes of this condition include:  Poor personal hygiene, especially in uncircumcised men. Not cleaning the glans penis and foreskin well can result in buildup of bacteria, viruses, and yeast, which can lead to infection and inflammation.  Irritation and lack of air flow due to fluid (smegma) that can build up on the glans penis.  Other causes include:  Chemical irritation from products such as soaps or shower gels (especially those that have fragrance), condoms, personal lubricants, petroleum jelly, spermicides, or fabric softeners.  Skin conditions, such as eczema, dermatitis, and psoriasis.  Allergies to medicines, such as tetracycline and sulfa drugs.  Certain medical conditions, including liver cirrhosis, congestive heart failure, diabetes, and kidney disease.  Infections, such as candidiasis, HPV (human papillomavirus), herpes simplex, gonorrhea, and syphilis.  Severe obesity.  What increases the risk? The following factors may make you more likely to develop this condition:  Having diabetes. This is the most common risk factor.  Having a tight foreskin that is difficult to pull back (retract) past the glans.  Having sexual  intercourse without using a condom.  What are the signs or symptoms? Symptoms of this condition include:  Discharge from under the foreskin.  A bad smell.  Pain or difficulty retracting the foreskin.  Tenderness, redness, and swelling of the glans.  A rash or sores on the glans or foreskin.  Itchiness.  Inability to get an erection due to pain.  Difficulty urinating.  Scarring of the penis or foreskin, in some cases.  How is this diagnosed? This condition may be diagnosed based on:  A physical exam.  Testing a swab of discharge to check for bacterial or fungal infection.  Blood tests: ? To check for viruses that can cause balanitis. ? To check your blood sugar (glucose) level. High blood glucose could be a sign of diabetes, which can cause balanitis.  How is this treated? Treatment for balanitis depends on the cause. Treatment may include:  Improving personal hygiene. Your health care provider may recommend sitting in a bath of warm water that is deep enough to cover your hips and buttocks (sitz bath).  Medicines such as: ? Creams or ointments to reduce swelling (steroids) or to treat an infection. ? Antibiotic medicine. ? Antifungal medicine.  Surgery to remove or cut the foreskin (circumcision). This may be done if you have scarring on the foreskin that makes it difficult to retract.  Controlling other medical problems that may be causing your condition or making it worse.  Follow these instructions at home:  Do not have sex until the condition clears up, or until your health care provider approves.  Keep your penis clean and dry. Take sitz baths as recommended by your health care provider.  Avoid products that irritate your skin  or make symptoms worse, such as soaps and shower gels that have fragrance.  Take over-the-counter and prescription medicines only as told by your health care provider. ? If you were prescribed an antibiotic medicine or a cream or  ointment, use it as told by your health care provider. Do not stop using your medicine, cream, or ointment even if you start to feel better. ? Do not drive or use heavy machinery while taking prescription pain medicine. Contact a health care provider if:  Your symptoms get worse or do not improve with home care.  You develop chills or a fever.  You have trouble urinating.  You cannot retract your foreskin. Get help right away if:  You develop severe pain.  You are unable to urinate. Summary  Balanitis is inflammation of the head of the penis (glans penis) caused by irritation or infection.  Balanitis causes pain, redness, and swelling of the glans penis.  This condition is most common among uncircumcised men who do not keep their glans penis clean and in men who have diabetes.  Treatment may include creams or ointments.  Good hygiene is important for prevention. This includes pulling back the foreskin when washing your penis. This information is not intended to replace advice given to you by your health care provider. Make sure you discuss any questions you have with your health care provider. Document Released: 10/01/2008 Document Revised: 04/03/2016 Document Reviewed: 04/03/2016 Elsevier Interactive Patient Education  2017 [employer]

## 2017-04-06 NOTE — Progress Notes (Signed)
Subjective:    Patient ID: Jonathon Graham, male    DOB: Feb 23, 1956, 61 y.o.   MRN: [id number]  HPI This is a 61 yo male, accompanied by his wife, who presents today with penis pain x several days. Is uncircumcised and thinks pain related to difficulties cleaning self.   He saw pulmonary 04/02/17- was told to continue to lose weight. A representative from his oxygen supplier is going to help him get his CPAP set up in the next couple of days. He has been contacted about pulmonary rehab and is waiting until his short term disability takes effect so his co pay will be less. Was told to follow up with pulmonary in 3 months. Has been checking pulse ox at home, runs 80s- mid 90s. Wearing oxygen 2L at all times now.   Is having a lot of back and generalized pain. This is longstanding. Has been seen by chiropractor in past and was given a back brace didn't seem to help. Has tried ibuprofen 800 mg x 2 without relief. No relief with tramadol and Alleve. Gabapentin helps leg pain but not back pain.   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 He is unable to return to work with any restrictions or with oxygen use. He has been in touch with medicare office to inquire about disability.   Past Medical History:  Diagnosis Date  . Arthritis   . Benign prostatic hyperplasia (BPH) with urinary urgency    nodular  . COPD (chronic obstructive pulmonary disease) (HCC)   . Depression   . Diabetes mellitus without complication (HCC)   . Essential hypertension   . Fibromyalgia, primary   . Hyperlipidemia   . Hypertension   . Obesity    No past surgical history on file. Family History  Problem Relation Age of Onset  . Heart disease Father   . Diabetes Mellitus II Sister   . Myasthenia gravis Sister   . Fibrocystic breast disease Sister   . Hypertension Sister   . Diabetes  Mellitus II Brother    Social History   Tobacco Use  . Smoking status: Former Smoker    Types: Cigarettes    Last attempt to quit: 03/28/2017    Years since quitting: 0.0  . Smokeless tobacco: Never Used  . Tobacco comment: 3-4 daily  Substance Use Topics  . Alcohol use: No    Alcohol/week: 0.0 oz  . Drug use: No      Review of Systems Per HPI    Objective:   Physical Exam  Constitutional: He is oriented to person, place, and time. He appears well-developed and well-nourished.  Morbidly obese  HENT:  Head: Normocephalic and atraumatic.  Eyes: Conjunctivae are normal.  Cardiovascular: Normal rate, regular rhythm and normal heart sounds.  Pulmonary/Chest: Effort normal. No respiratory distress. He has no wheezes. He has no rales.  Slightly distant breath sounds.   Genitourinary: Uncircumcised.  Genitourinary Comments: Foreskin retracted easily, glands with erythema and moderate amount thick, white material. No urethral discharge.   Neurological: He is alert and oriented to person, place, and time.  Skin: Skin is warm and dry.  Psychiatric: He has a normal mood and affect. His behavior is normal. Judgment and thought content normal.  Vitals reviewed.     BP 112/70 (BP Location: Right Arm, Patient Position: Sitting, Cuff Size: Normal)   Pulse 86  Temp 98.5 F (36.9 C) (Oral)   Wt (!) 325 lb 8 oz (147.6 kg)   SpO2 96%   BMI 49.49 kg/m  Wt Readings from Last 3 Encounters:  04/06/17 (!) 325 lb 8 oz (147.6 kg)  04/02/17 (!) 330 lb (149.7 kg)  03/30/17 (!) 329 lb (149.2 kg)       Assessment & Plan:  1. Balanitis - Provided written and verbal information regarding diagnosis and treatment. - RTC precautions reviewed - clotrimazole (LOTRIMIN) 1 % cream; Apply 1 application 2 (two) times daily topically. For 7 days  Dispense: 28 g; Refill: 0  2. Myalgia - ongoing problem, patient states he has fibromyalgia and rheumatoid arthritis. I am unable to find any labs  supporting RA. He has not seen rheumatology in past.  - Basic metabolic panel - Sedimentation rate - Rheumatoid factor  3. Chronic bilateral low back pain without sciatica - reluctant to prescribe any narcotic pain meds with his current hypoxia - will increase his gabapentin from BID to TID  4. Type 2 diabetes, controlled, with peripheral neuropathy (HCC) - gabapentin (NEURONTIN) 300 MG capsule; Take 1 capsule (300 mg total) 3 (three) times daily by mouth.  Dispense: 270 capsule; Refill: 1  5. Pulmonary emphysema, unspecified emphysema type (HCC) - Discussed benefits of pulmonary rehab and he is interested when his insurance will pick up more of the cost  6. Severe obstructive sleep apnea - reminded him of importance of using CPAP nightly  7. Morbid obesity (HCC) - losing a little weight, encouraged continued work on diet and activity as tolerated  - follow up in 3 weeks  Olean Reeeborah Makynna Manocchio, FNP-BC  [facility], [facility]  04/07/2017 3:01 PM

## 2017-04-07 ENCOUNTER — Encounter: Payer: Self-pay | Admitting: Family Medicine

## 2017-04-07 LAB — RHEUMATOID FACTOR: Rhuematoid fact SerPl-aCnc: <14 IU/mL (ref <14)

## 2017-04-09 ENCOUNTER — Telehealth: Payer: Self-pay | Admitting: Family Medicine

## 2017-04-09 NOTE — Telephone Encounter (Signed)
Disability forms In Jonathon Graham's IN BOX

## 2017-04-16 ENCOUNTER — Encounter: Payer: Self-pay | Admitting: *Deleted

## 2017-04-16 NOTE — Telephone Encounter (Signed)
Forms given to Robin 

## 2017-04-16 NOTE — Telephone Encounter (Signed)
   Answer Assessment - Initial Assessment Questions 1. ONSET: "When did the pain begin?"      5 days ago- got worse 2. LOCATION: "Where does it hurt?" (upper, mid or lower back)     Lower back 3. SEVERITY: "How bad is the pain?"  (e.g., Scale 1-10; mild, moderate, or severe)   - MILD (1-3): doesn't interfere with normal activities    - MODERATE (4-7): interferes with normal activities or awakens from sleep    - SEVERE (8-10): excruciating pain, unable to do any normal activities      8 4. PATTERN: "Is the pain constant?" (e.g., yes, no; constant, intermittent)      Constant if up- if lays down 4-5 5. RADIATION: "Does the pain shoot into your legs or elsewhere?"     Radiates up mid back 6. CAUSE:  "What do you think is causing the back pain?"      Weight possible- unsure 7. BACK OVERUSE:  "Any recent lifting of heavy objects, strenuous work or exercise?"     No- patient uses O2  8. MEDICATIONS: "What have you taken so far for the pain?" (e.g., nothing, acetaminophen, NSAIDS)     No- aleve, tramadol 9. NEUROLOGIC SYMPTOMS: "Do you have any weakness, numbness, or problems with bowel/bladder control?"     Bladder control due to prostrate problems 10. OTHER SYMPTOMS: "Do you have any other symptoms?" (e.g., fever, abdominal pain, burning with urination, blood in urine)       palpitations for 4 days,  low sat rates 5-6 times /day 84 11. PREGNANCY: "Is there any chance you are pregnant?" (e.g., yes, no; LMP)       n/a    Patient called to reports symptoms he experienced last week- he reports he is not experiencing them know and he is at his baseline. All triage questions for heart attack and/or stroke were answered- no.  Protocols used: BACK PAIN-A-AH

## 2017-04-16 NOTE — Telephone Encounter (Signed)
This encounter was created in error - please disregard.

## 2017-04-16 NOTE — Telephone Encounter (Signed)
Paperwork faxed °Copy for pt °Copy for file °Copy for scan °Copy for billing °Pt aware °

## 2017-04-18 ENCOUNTER — Ambulatory Visit: Payer: BC Managed Care – PPO | Admitting: Family Medicine

## 2017-04-18 ENCOUNTER — Encounter: Payer: Self-pay | Admitting: Family Medicine

## 2017-04-18 VITALS — BP 118/76 | HR 82 | Temp 98.0°F | Wt 335.0 lb

## 2017-04-18 DIAGNOSIS — M5441 Lumbago with sciatica, right side: Secondary | ICD-10-CM

## 2017-04-18 DIAGNOSIS — G8929 Other chronic pain: Secondary | ICD-10-CM | POA: Diagnosis not present

## 2017-04-18 DIAGNOSIS — M5442 Lumbago with sciatica, left side: Secondary | ICD-10-CM | POA: Diagnosis not present

## 2017-04-18 DIAGNOSIS — J439 Emphysema, unspecified: Secondary | ICD-10-CM

## 2017-04-18 MED ORDER — IBUPROFEN 800 MG PO TABS: 800.0000 mg | ORAL_TABLET | Freq: Three times a day (TID) | ORAL | 1 refills | Status: DC | PRN

## 2017-04-18 NOTE — Patient Instructions (Signed)
Take ibuprofen 2-3 times a day Continue heat and massage   Back Exercises If you have pain in your back, do these exercises 2-3 times each day or as told by your doctor. When the pain goes away, do the exercises once each day, but repeat the steps more times for each exercise (do more repetitions). If you do not have pain in your back, do these exercises once each day or as told by your doctor. Exercises Single Knee to Chest  Do these steps 3-5 times in a row for each leg: 1. Lie on your back on a firm bed or the floor with your legs stretched out. 2. Bring one knee to your chest. 3. Hold your knee to your chest by grabbing your knee or thigh. 4. Pull on your knee until you feel a gentle stretch in your lower back. 5. Keep doing the stretch for 10-30 seconds. 6. Slowly let go of your leg and straighten it.  Pelvic Tilt  Do these steps 5-10 times in a row: 1. Lie on your back on a firm bed or the floor with your legs stretched out. 2. Bend your knees so they point up to the ceiling. Your feet should be flat on the floor. 3. Tighten your lower belly (abdomen) muscles to press your lower back against the floor. This will make your tailbone point up to the ceiling instead of pointing down to your feet or the floor. 4. Stay in this position for 5-10 seconds while you gently tighten your muscles and breathe evenly.  Cat-Cow  Do these steps until your lower back bends more easily: 1. Get on your hands and knees on a firm surface. Keep your hands under your shoulders, and keep your knees under your hips. You may put padding under your knees. 2. Let your head hang down, and make your tailbone point down to the floor so your lower back is round like the back of a cat. 3. Stay in this position for 5 seconds. 4. Slowly lift your head and make your tailbone point up to the ceiling so your back hangs low (sags) like the back of a cow. 5. Stay in this position for 5 seconds.  Press-Ups  Do these  steps 5-10 times in a row: 1. Lie on your belly (face-down) on the floor. 2. Place your hands near your head, about shoulder-width apart. 3. While you keep your back relaxed and keep your hips on the floor, slowly straighten your arms to raise the top half of your body and lift your shoulders. Do not use your back muscles. To make yourself more comfortable, you may change where you place your hands. 4. Stay in this position for 5 seconds. 5. Slowly return to lying flat on the floor.  Bridges  Do these steps 10 times in a row: 1. Lie on your back on a firm surface. 2. Bend your knees so they point up to the ceiling. Your feet should be flat on the floor. 3. Tighten your butt muscles and lift your butt off of the floor until your waist is almost as high as your knees. If you do not feel the muscles working in your butt and the back of your thighs, slide your feet 1-2 inches farther away from your butt. 4. Stay in this position for 3-5 seconds. 5. Slowly lower your butt to the floor, and let your butt muscles relax.  If this exercise is too easy, try doing it with your arms crossed  over your chest. Belly Crunches  Do these steps 5-10 times in a row: 1. Lie on your back on a firm bed or the floor with your legs stretched out. 2. Bend your knees so they point up to the ceiling. Your feet should be flat on the floor. 3. Cross your arms over your chest. 4. Tip your chin a little bit toward your chest but do not bend your neck. 5. Tighten your belly muscles and slowly raise your chest just enough to lift your shoulder blades a tiny bit off of the floor. 6. Slowly lower your chest and your head to the floor.  Back Lifts Do these steps 5-10 times in a row: 1. Lie on your belly (face-down) with your arms at your sides, and rest your forehead on the floor. 2. Tighten the muscles in your legs and your butt. 3. Slowly lift your chest off of the floor while you keep your hips on the floor. Keep the  back of your head in line with the curve in your back. Look at the floor while you do this. 4. Stay in this position for 3-5 seconds. 5. Slowly lower your chest and your face to the floor.  Contact a doctor if:  Your back pain gets a lot worse when you do an exercise.  Your back pain does not lessen 2 hours after you exercise. If you have any of these problems, stop doing the exercises. Do not do them again unless your doctor says it is okay. Get help right away if:  You have sudden, very bad back pain. If this happens, stop doing the exercises. Do not do them again unless your doctor says it is okay. This information is not intended to replace advice given to you by your health care provider. Make sure you discuss any questions you have with your health care provider. Document Released: 06/17/2010 Document Revised: 10/21/2015 Document Reviewed: 07/09/2014 Elsevier Interactive Patient Education  [employer]2018 [employer]

## 2017-04-18 NOTE — Progress Notes (Signed)
Subjective:    Patient ID: Jonathon PaulaKeith Graham, male    DOB: 11/16/1955, 61 y.o.   MRN: [id number]  HPI This is a 61 yo male who presents today with continued low back pain. This is chronic in nature and he has been told he has arthritis in his low back. He feels weak in his lower body, pain in tail bone. Had xray 10/31/16 that showed spondylitic changes L4-S1. Pain and aching down legs. At visit last month we increased neurotin to TID from BID, not sure that it is helping much.   Is using CPAP nightly. Sleeping longer than usual, able to sleep 5-6 hours straight. Is wearing 2L oxygen all of the time. Notices that he gets very SOB if takes it off for more than an hour. Is not having SOB with activity as long as he uses his oxygen.  He is working with his employer liaison to try to get pulmonary rehab.  He is hoping to hear something in the next week.  He feels that his breathing has improved since discharge from hospital, but is not back to baseline.  He denies chest pain.  Has been decreasing sugars and starches.  Trying to walk around more at home. Balanitis from last visit resolved.     Past Medical History:  Diagnosis Date  . Arthritis   . Benign prostatic hyperplasia (BPH) with urinary urgency    nodular  . COPD (chronic obstructive pulmonary disease) (HCC)   . Depression   . Diabetes mellitus without complication (HCC)   . Essential hypertension   . Fibromyalgia, primary   . Hyperlipidemia   . Hypertension   . Obesity    No past surgical history on file. Family History  Problem Relation Age of Onset  . Heart disease Father   . Diabetes Mellitus II Sister   . Myasthenia gravis Sister   . Fibrocystic breast disease Sister   . Hypertension Sister   . Diabetes Mellitus II Brother    Social History   Tobacco Use  . Smoking status: Former Smoker    Types: Cigarettes    Last attempt to quit: 03/28/2017    Years since quitting: 0.0  . Smokeless tobacco: Never Used  . Tobacco  comment: 3-4 daily  Substance Use Topics  . Alcohol use: No    Alcohol/week: 0.0 oz  . Drug use: No     Review of Systems Per HPI    Objective:   Physical Exam  Constitutional: He is oriented to person, place, and time. He appears well-developed and well-nourished. No distress.  Morbidly obese  HENT:  Head: Normocephalic and atraumatic.  Eyes: Conjunctivae are normal.  Cardiovascular: Normal rate, regular rhythm and normal heart sounds.  Pulmonary/Chest: Effort normal.  Distant breath sounds, no wheezing, rales or rhonchi.   Musculoskeletal: He exhibits edema (trace pretibial).       Cervical back: He exhibits normal range of motion and no tenderness.       Thoracic back: He exhibits normal range of motion and no tenderness.       Lumbar back: He exhibits decreased range of motion, tenderness and bony tenderness.  Neurological: He is alert and oriented to person, place, and time.  Skin: Skin is warm and dry. He is not diaphoretic.  Psychiatric: He has a normal mood and affect. His behavior is normal. Judgment and thought content normal.  Vitals reviewed.     BP 118/76   Pulse 82   Temp 98 F (36.7 C) (  Oral)   Wt (!) 335 lb (152 kg)   SpO2 95%   BMI 50.94 kg/m  Wt Readings from Last 3 Encounters:  04/18/17 (!) 335 lb (152 kg)  04/06/17 (!) 325 lb 8 oz (147.6 kg)  04/02/17 (!) 330 lb (149.7 kg)       Assessment & Plan:  1. Chronic bilateral low back pain with bilateral sciatica - Provided written and verbal information regarding diagnosis and treatment. - provided written and verbal instructions for exercises, consider PT once he has had pulmonary rehab - ibuprofen (ADVIL,MOTRIN) 800 MG tablet; Take 1 tablet (800 mg total) by mouth every 8 (eight) hours as needed.  Dispense: 60 tablet; Refill: 1  2. Morbid obesity (HCC) - encouraged continued healthy food choices and increased activity as tolerated, have discussed referral to nutrition services/diabetic education,  but are prioritizing patient's need for pulmonary rehab at this point.  3. Pulmonary emphysema, unspecified emphysema type (HCC) -He actually seems to be doing much better since hospital discharge even though he feels more shortness of breath off oxygen - Encouraged him to continue to use his 2 L of oxygen at all times, encouraged deep breathing several times a day and continued regular use of CPAP every night  - follow-up in 2 months     Olean Reeeborah Gessner, FNP-BC  [facility], [facility]  04/22/2017 11:10 AM

## 2017-04-22 ENCOUNTER — Encounter: Payer: Self-pay | Admitting: Family Medicine

## 2017-04-23 ENCOUNTER — Other Ambulatory Visit: Payer: Self-pay | Admitting: Internal Medicine

## 2017-04-27 ENCOUNTER — Ambulatory Visit: Payer: Self-pay | Admitting: Family Medicine

## 2017-04-27 ENCOUNTER — Ambulatory Visit (INDEPENDENT_AMBULATORY_CARE_PROVIDER_SITE_OTHER): Payer: BC Managed Care – PPO | Admitting: Family Medicine

## 2017-04-27 ENCOUNTER — Encounter: Payer: Self-pay | Admitting: Family Medicine

## 2017-04-27 VITALS — BP 114/72 | HR 76 | Temp 98.1°F | Wt 335.0 lb

## 2017-04-27 DIAGNOSIS — L918 Other hypertrophic disorders of the skin: Secondary | ICD-10-CM

## 2017-04-27 DIAGNOSIS — Z6841 Body Mass Index (BMI) 40.0 and over, adult: Secondary | ICD-10-CM | POA: Diagnosis not present

## 2017-04-27 DIAGNOSIS — E662 Morbid (severe) obesity with alveolar hypoventilation: Secondary | ICD-10-CM | POA: Diagnosis not present

## 2017-04-27 NOTE — Progress Notes (Signed)
Subjective:    Patient ID: Jonathon Graham, male    DOB: 1956-03-20, 61 y.o.   MRN: [id number]  HPI This is a 61 yo male, accompanied by his wife, who presents today with complaint of skin tags.  He reports multiple skin tags under both arms and with a large 1 on his side.  These cause irritation.  In the past he has had these removed via ? laser by [place] dermatologist in [place].  He does report that he had quite a bit of bleeding with skin tag removal in the past.  He is still working with his pulmonologist to attend pulmonary rehab.  He reports that his breathing has been a little better.  He continues to use his oxygen 2 L nasal cannula most of the time.  No cough, no sputum.  He has not gotten his test strips for his home glucometer.  He says he will pick them up today.  He has not lost any weight since seeing me last week.  Discussed with patient and his wife need for nutrition education and they agreed to [facility].  Past Medical History:  Diagnosis Date  . Arthritis   . Benign prostatic hyperplasia (BPH) with urinary urgency    nodular  . COPD (chronic obstructive pulmonary disease) (HCC)   . Depression   . Diabetes mellitus without complication (HCC)   . Essential hypertension   . Fibromyalgia, primary   . Hyperlipidemia   . Hypertension   . Obesity    No past surgical history on file. Family History  Problem Relation Age of Onset  . Heart disease Father   . Diabetes Mellitus II Sister   . Myasthenia gravis Sister   . Fibrocystic breast disease Sister   . Hypertension Sister   . Diabetes Mellitus II Brother    Social History   Tobacco Use  . Smoking status: Former Smoker    Types: Cigarettes    Last attempt to quit: 03/28/2017    Years since quitting: 0.0  . Smokeless tobacco: Never Used  . Tobacco comment: 3-4 daily  Substance Use Topics  . Alcohol use: No    Alcohol/week: 0.0 oz  . Drug use: No    Review of  Systems Per HPI    Objective:   Physical Exam  Constitutional: He is oriented to person, place, and time. He appears well-developed and well-nourished. No distress.  Morbidly obese.   HENT:  Head: Normocephalic and atraumatic.  Eyes: Conjunctivae are normal.  Cardiovascular: Normal rate, regular rhythm and normal heart sounds.  Pulmonary/Chest: Effort normal and breath sounds normal.  Neurological: He is alert and oriented to person, place, and time.  Skin: Skin is warm and dry. He is not diaphoretic.  He has a large pedunculated skin tag on his right lateral side. Multiple small skin tags under bilateral axilla.  These were treated with liquid nitrogen.  Patient tolerated well.  Vitals reviewed.        BP 114/72 (BP Location: Left Arm, Patient Position: Sitting, Cuff Size: Large)   Pulse 76   Temp 98.1 F (36.7 C) (Oral)   Wt (!) 335 lb (152 kg)   SpO2 94%   BMI 50.94 kg/m  Wt Readings from Last 3 Encounters:  04/27/17 (!) 335 lb (152 kg)  04/18/17 (!) 335 lb (152 kg)  04/06/17 (!) 325 lb 8 oz (147.6 kg)    Assessment & Plan:  1. Skin tags, multiple acquired - will  likely need re treatment at future visit, discussed this process with patient - he was instructed to keep treated area clean and dry and RTC for follow up if he develops any erythema or discharge - Provided written and verbal information regarding diagnosis and treatment.  2. BMI 50.0-59.9, adult [facility](HCC) - Discussed importance of weight loss and will put in referral for him to attend diabetic education at [facility].  Lack of financial resources has been a barrier for services such as nutrition education and pulmonary rehab. 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 Obesity hypoventilation syndrome (HCC) - Encouraged him to follow up regarding pulmonary rehab with his pulmonologist.   - Follow up as scheduled  Olean Reeeborah Gessner,  FNP-BC  [facility] [place], [facility]  04/30/2017 8:46 AM

## 2017-04-27 NOTE — Patient Instructions (Signed)
I will put in referral for diabetes education class  We can retreat your skin tags at next visit  Elevated feet when sitting, avoid prolonged standing , walking is better  No soda, juice, sweet tea  Please check your blood sugars    Skin Tag, Adult A skin tag (acrochordon) is a soft, extra growth of skin. Most skin tags are flesh-colored and rarely bigger than a pencil eraser. They commonly form near areas where there are folds in the skin, such as the armpit or groin. Skin tags are not dangerous, and they do not spread from person to person (are not contagious). You may have one skin tag or several. Skin tags do not require treatment. However, your health care provider may recommend removal of a skin tag if it:  Gets irritated from clothing.  Bleeds.  Is visible and unsightly.  Your health care provider can remove skin tags with a simple surgical procedure or a procedure that involves freezing the skin tag. Follow these instructions at home:  Watch for any changes in your skin tag. A normal skin tag does not require any other special care at home.  Take over-the-counter and prescription medicines only as told by your health care provider.  Keep all follow-up visits as told by your health care provider. This is important. Contact a health care provider if:  You have a skin tag that: ? Becomes painful. ? Changes color. ? Bleeds. ? Swells.  You develop more skin tags. This information is not intended to replace advice given to you by your health care provider. Make sure you discuss any questions you have with your health care provider. Document Released: 05/30/2015 Document Revised: 01/09/2016 Document Reviewed: 05/30/2015 Elsevier Interactive Patient Education  [employer]2018 Elsevier Inc.

## 2017-04-30 ENCOUNTER — Encounter: Payer: Self-pay | Admitting: Family Medicine

## 2017-05-02 ENCOUNTER — Telehealth: Payer: Self-pay | Admitting: Family Medicine

## 2017-05-02 NOTE — Telephone Encounter (Signed)
Spoke with pt  He stated the 03/24/17 date needs to be changed to 03/15/17.  He said he was admitted to hospital 03/15/17 In Debbie's IN box    Copied from CRM [id number]. Topic: Inquiry >> Apr 04, 2017  9:35 AM Trula SladeWalter, Linda F wrote: Reason for CRM: Patient is checking to see if his short term disability papers was received.  They were faxed into the office yesterday 04/03/17.    >> Apr 05, 2017 11:22 AM Carrie MewHayes, Robin B wrote: Left message asking pt to call office  I have not received disability form 11/8/rbh >> Apr 10, 2017  2:59 PM Landry MellowFoltz, Melissa J wrote: Pt calling to find out if fmla has been finished and has been faxed to employer. cb number for pt is [phone] (M) >> Apr 16, 2017  1:13 PM Wyatt PortelaHayes, Robin B wrote: [facility] aware >> May 01, 2017 12:05 PM Landry MellowFoltz, Melissa J wrote: Pt called  He needs to have the date on his short term disability changed to Mar 15, 2017. He needs to paperwork re-faxed to [facility] [phone] Attn: Shelly FlattenSteven Hale ans says he needs this done by tomorrow.   >> May 01, 2017  2:40 PM Carrie MewHayes, Robin B wrote: Left mess asking pt to call office please transfer call to Robin @ [facility]  Thank you

## 2017-05-02 NOTE — Telephone Encounter (Signed)
Paperwork faxed Copy for file Copy for scan Copy for pt  Patient aware

## 2017-05-02 NOTE — Telephone Encounter (Signed)
I have changed date on his form and placed on your desk.

## 2017-05-09 ENCOUNTER — Telehealth: Payer: Self-pay | Admitting: Family Medicine

## 2017-05-09 NOTE — Telephone Encounter (Signed)
Left message letting pt know paperwork is ready for pick up he will need to turn this in

## 2017-05-09 NOTE — Telephone Encounter (Signed)
Spouse dropped off notice of disability claimant statement  In debbies in box

## 2017-05-09 NOTE — Telephone Encounter (Signed)
Completed forms and returned to [place].

## 2017-05-09 NOTE — Telephone Encounter (Signed)
Pt spouse picked up paperwork.

## 2017-05-11 ENCOUNTER — Ambulatory Visit: Payer: Self-pay | Admitting: Family Medicine

## 2017-05-25 ENCOUNTER — Telehealth: Payer: Self-pay | Admitting: Internal Medicine

## 2017-05-25 DIAGNOSIS — J449 Chronic obstructive pulmonary disease, unspecified: Secondary | ICD-10-CM

## 2017-05-25 NOTE — Telephone Encounter (Signed)
Patient due for PFT ov scheduled 1-28 with Ram

## 2017-05-25 NOTE — Telephone Encounter (Signed)
[facility]

## 2017-06-01 ENCOUNTER — Ambulatory Visit: Payer: BC Managed Care – PPO | Admitting: Family Medicine

## 2017-06-01 ENCOUNTER — Encounter: Payer: Self-pay | Admitting: Family Medicine

## 2017-06-01 VITALS — BP 110/70 | HR 74 | Temp 97.4°F | Wt 340.0 lb

## 2017-06-01 DIAGNOSIS — E662 Morbid (severe) obesity with alveolar hypoventilation: Secondary | ICD-10-CM | POA: Diagnosis not present

## 2017-06-01 DIAGNOSIS — E1142 Type 2 diabetes mellitus with diabetic polyneuropathy: Secondary | ICD-10-CM | POA: Diagnosis not present

## 2017-06-01 DIAGNOSIS — G8929 Other chronic pain: Secondary | ICD-10-CM | POA: Diagnosis not present

## 2017-06-01 DIAGNOSIS — M545 Low back pain: Secondary | ICD-10-CM

## 2017-06-01 MED ORDER — HYDROCHLOROTHIAZIDE 12.5 MG PO CAPS: 12.5000 mg | ORAL_CAPSULE | Freq: Every day | ORAL | 1 refills | Status: DC

## 2017-06-01 NOTE — Patient Instructions (Addendum)
No soda (including ginger ale) or juice   Eat 25 or fewer carbohydrates at each meal. Increase vegetables (non starchy) and lean proteins.   Please do back stretching twice a day  Continue ibuprofen twice a day, can take tylenol 2 tablets every 8 hours   Carbohydrate Counting for Diabetes Mellitus, Adult Carbohydrate counting is a method for keeping track of how many carbohydrates you eat. Eating carbohydrates naturally increases the amount of sugar (glucose) in the blood. Counting how many carbohydrates you eat helps keep your blood glucose within normal limits, which helps you manage your diabetes (diabetes mellitus). It is important to know how many carbohydrates you can safely have in each meal. This is different for every person. A diet and nutrition specialist (registered dietitian) can help you make a meal plan and calculate how many carbohydrates you should have at each meal and snack. Carbohydrates are found in the following foods:  Grains, such as breads and cereals.  Dried beans and soy products.  Starchy vegetables, such as potatoes, peas, and corn.  Fruit and fruit juices.  Milk and yogurt.  Sweets and snack foods, such as cake, cookies, candy, chips, and soft drinks.  How do I count carbohydrates? There are two ways to count carbohydrates in food. You can use either of the methods or a combination of both. Reading "Nutrition Facts" on packaged food The "Nutrition Facts" list is included on the labels of almost all packaged foods and beverages in the [place] It includes:  The serving size.  Information about nutrients in each serving, including the grams (g) of carbohydrate per serving.  To use the "Nutrition Facts":  Decide how many servings you will have.  Multiply the number of servings by the number of carbohydrates per serving.  The resulting number is the total amount of carbohydrates that you will be having.  Learning standard serving sizes of other  foods When you eat foods containing carbohydrates that are not packaged or do not include "Nutrition Facts" on the label, you need to measure the servings in order to count the amount of carbohydrates:  Measure the foods that you will eat with a food scale or measuring cup, if needed.  Decide how many standard-size servings you will eat.  Multiply the number of servings by 15. Most carbohydrate-rich foods have about 15 g of carbohydrates per serving. ? For example, if you eat 8 oz (170 g) of strawberries, you will have eaten 2 servings and 30 g of carbohydrates (2 servings x 15 g = 30 g).  For foods that have more than one food mixed, such as soups and casseroles, you must count the carbohydrates in each food that is included.  The following list contains standard serving sizes of common carbohydrate-rich foods. Each of these servings has about 15 g of carbohydrates:   hamburger bun or  English muffin.   oz (15 mL) syrup.   oz (14 g) jelly.  1 slice of bread.  1 six-inch tortilla.  3 oz (85 g) cooked rice or pasta.  4 oz (113 g) cooked dried beans.  4 oz (113 g) starchy vegetable, such as peas, corn, or potatoes.  4 oz (113 g) hot cereal.  4 oz (113 g) mashed potatoes or  of a large baked potato.  4 oz (113 g) canned or frozen fruit.  4 oz (120 mL) fruit juice.  4-6 crackers.  6 chicken nuggets.  6 oz (170 g) unsweetened dry cereal.  6 oz (170 g)  plain fat-free yogurt or yogurt sweetened with artificial sweeteners.  8 oz (240 mL) milk.  8 oz (170 g) fresh fruit or one small piece of fruit.  24 oz (680 g) popped popcorn.  Example of carbohydrate counting Sample meal  3 oz (85 g) chicken breast.  6 oz (170 g) brown rice.  4 oz (113 g) corn.  8 oz (240 mL) milk.  8 oz (170 g) strawberries with sugar-free whipped topping. Carbohydrate calculation 1. Identify the foods that contain  carbohydrates: ? Rice. ? Corn. ? Milk. ? Strawberries. 2. Calculate how many servings you have of each food: ? 2 servings rice. ? 1 serving corn. ? 1 serving milk. ? 1 serving strawberries. 3. Multiply each number of servings by 15 g: ? 2 servings rice x 15 g = 30 g. ? 1 serving corn x 15 g = 15 g. ? 1 serving milk x 15 g = 15 g. ? 1 serving strawberries x 15 g = 15 g. 4. Add together all of the amounts to find the total grams of carbohydrates eaten: ? 30 g + 15 g + 15 g + 15 g = 75 g of carbohydrates total. This information is not intended to replace advice given to you by your health care provider. Make sure you discuss any questions you have with your health care provider. Document Released: 05/15/2005 Document Revised: 12/03/2015 Document Reviewed: 10/27/2015 Elsevier Interactive Patient Education  [employer].

## 2017-06-01 NOTE — Progress Notes (Signed)
Subjective:    Patient ID: Jonathon Graham, male    DOB: 04/30/1956, 62 y.o.   MRN: [id number]  HPI This is a 62 yo male, accompanied by his wife, who presents today with continued back and leg pain.   Back pain-Pain in lower back, no radiation. Legs feel weak and stiff. He has not had any falls. Went to the gym x 1 and did 20-30 minutes on machines, felt terrible the next day and has not been back. He takes ibuprofen 800 mg 1-2 times per day, gabapentin 300 mg TID. Some improvement of pain. RF/ESR- normal 11/18. No bowel or bladder incontinence.  Hypoxia/COPD- Will be starting pulmonary rehab next week. Breathing up and down, does ok when on 2 l oxygen. Is waiting for [employer] to get new CPAP machine. Should be out to his house in next 10 days. Has appointment for PFTs and follow up with pulmonary later this month.   DM type 2/obesity- Has decreased portions, feels like he needs to eat rice, potatoes or breads with most meals. Will commonly eat 2 bagels for breakfast, drinks ginger ale and juice most days. Is scheduled to go to [place] at end of month for diabetes education. Last hemoglobin A1c- 7.6. Has not been checking blood sugars at home, has not picked up glucometer strips from pharmacy.    Past Medical History:  Diagnosis Date  . Arthritis   . Benign prostatic hyperplasia (BPH) with urinary urgency    nodular  . COPD (chronic obstructive pulmonary disease) ([place])   . Depression   . Diabetes mellitus without complication ([place])   . Essential hypertension   . Fibromyalgia, primary   . Hyperlipidemia   . Hypertension   . Obesity    No past surgical history on file. Family History  Problem Relation Age of Onset  . Heart disease Father   . Diabetes Mellitus II Sister   . Myasthenia gravis Sister   . Fibrocystic breast disease Sister   . Hypertension Sister   . Diabetes Mellitus II Brother    Social History   Tobacco Use  . Smoking status: Former Smoker    Types:  Cigarettes    Last attempt to quit: 03/28/2017    Years since quitting: 0.1  . Smokeless tobacco: Never Used  . Tobacco comment: 3-4 daily  Substance Use Topics  . Alcohol use: No    Alcohol/week: 0.0 oz  . Drug use: No      Review of Systems Per HPI    Objective:   Physical Exam  Constitutional: He is oriented to person, place, and time. He appears well-developed and well-nourished. No distress.  Morbidly obese.   HENT:  Head: Normocephalic and atraumatic.  Eyes: Conjunctivae are normal.  Neck: Normal range of motion. Neck supple.  Cardiovascular: Normal rate, regular rhythm and normal heart sounds.  Pulmonary/Chest: Effort normal and breath sounds normal.  Musculoskeletal:       Cervical back: Normal.       Thoracic back: Normal.       Lumbar back: He exhibits tenderness. He exhibits normal range of motion.  Able to ambulate unassisted and get onto exam table. No pain with straight leg raise. Very tight hamstrings and hips.   Neurological: He is alert and oriented to person, place, and time.  Skin: Skin is warm and dry. He is not diaphoretic.  Psychiatric: He has a normal mood and affect. His behavior is normal. Judgment and thought content normal.  Vitals reviewed.  BP 110/70 (BP Location: Right Arm, Patient Position: Sitting, Cuff Size: Normal)   Pulse 74   Temp (!) 97.4 F (36.3 C) (Oral)   Wt (!) 340 lb (154.2 kg)   SpO2 93%   BMI 51.70 kg/m  Wt Readings from Last 3 Encounters:  06/01/17 (!) 340 lb (154.2 kg)  04/27/17 (!) 335 lb (152 kg)  04/18/17 (!) 335 lb (152 kg)       Assessment & Plan:  1. Chronic bilateral low back pain without sciatica - Would benefit from PT but unable to work that in right now, has been working on getting in to pulmonary rehab and will finally be starting this month - Discussed and encouraged him to increase activity without overdoing it, provided written and verbal instructions regarding back exercises - Can try heat/ice,  acetaminophen 2 tablets every 8 hours  2. Obesity hypoventilation syndrome ([place]) - Again discussed diet, weight loss, encouraged him to count carbs and provided written and verbal instructions. Reiterated need to avoid all soda/juice, decrease processed foods and simple carbohydrates.  - Hopeful that attending diabetes education classes later this month and starting pulmonary rehab will significantly improve knowledge and motivation  3. Type 2 diabetes mellitus with diabetic polyneuropathy, without long-term current use of insulin ([place]) - Encouraged him to pick up glucometer strips and check his blood sugars  - follow up in 6-8 weeks   Clarene Reamer, FNP-BC  [facility] at [facility], [facility]  06/04/2017 7:59 AM

## 2017-06-04 ENCOUNTER — Encounter: Payer: Self-pay | Admitting: Family Medicine

## 2017-06-13 ENCOUNTER — Ambulatory Visit: Payer: Self-pay | Admitting: Family Medicine

## 2017-06-19 ENCOUNTER — Ambulatory Visit: Payer: BC Managed Care – PPO | Attending: Internal Medicine

## 2017-06-22 NOTE — Progress Notes (Signed)
[facility] Consultation      Assessment and Plan:  62 yo male with sleep apnea, morbid obesity, dyspnea.  Asthma with Chronic bronchitis/COPD with chronic dyspnea on exertion.  --Has not been using Brio regularly. --Is asked to use Brio once daily, and only once daily, encouraged to rinse mouth after use. --Smoking cessation.  -He has not gone for his pulmonary function testing.  Tobacco abuse.  --Discussed the importance of continued smoking cessation which will help with his breathing >3 min.   OSA  -He is using CPAP for the entire night.   Insomnia with excessive daytime sleepiness. 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 -He has multiple etiologies for his insomnia and daytime sleepiness, which may include peripheral neuropathy, poor sleep hygiene, anxiety. He may also have some degree of circadian rhythm disorder given his early rising time at 2:30 AM. -Advised him not to take naps during the day, we discussed improvement of sleep hygiene. He may benefit from Ambien or similar medication, however, we will look at his baseline CO2 levels first.  Obesity -Severe morbid obesity with BMI of 51, this may be contributing to obesity hypoventilation syndrome and elevated CO2 levels. -Arterial blood gas 02/01/17; 7.39/53/52/32, consistent with obesity hypoventilation  Meds ordered this encounter  Medications  . albuterol (PROAIR HFA) 108 (90 Base) MCG/ACT inhaler    Sig: Inhale 1-2 puffs into the lungs every 4 (four) hours as needed for wheezing or shortness of breath.    Dispense:  1 Inhaler    Refill:  5  . fluticasone furoate-vilanterol (BREO ELLIPTA) 200-25 MCG/INH AEPB    Sig: Inhale 1 puff into the lungs daily.    Dispense:  1 each    Refill:  5  . fluticasone furoate-vilanterol (BREO ELLIPTA) 200-25 MCG/INH AEPB    Sig: Inhale 1 puff into the lungs daily.    Dispense:  30 each    Refill:  1   Return in about 6 months (around 12/23/2017).     Date:  06/22/2017  MRN# [id number] Jonathon Graham [birth year]    Jonathon Graham is a 62 y.o. old male seen in consultation for chief complaint of:    Chief Complaint  Patient presents with  . Follow-up    SOB w/activity: anxiety at times    HPI:   The patient is a 62 yo male with a history of OSA, severe daytime sleepiness, sleep apnea, dyspnea, insomnia.  He has been doing well with the PAP, he has a prostate issue and has to wake up frequently, he has also been having issues with neuropathy.  He has continued on oxygen at 2L with PAP and 2L most of the day, but he is not allowed to work with it.  His breathing is ok at rest but worse with moderate dyspnea and feels that the oxygen helps.  He notes that he takes Breo 3 times per week and sometimes twice per day on those days.   He still smokes 1-2 cigs per day.   **ABG 02/01/17; 7.39/53/52/31-- chronic hypercapnic and hypoxic respiratory failure.  **Images personally reviewed, CT chest 11/19/2016; appears lung volume secondary to body habitus with bibasilar atelectasis, otherwise lungs are unremarkable.  Review of outside sleep study 04/17/13; split-night AHI equals 101.4 consistent with very severe sleep apnea, no central apnea noted. Patient was started on CPAP, titrated to a pressure of 16. At this pressure lowest sat was 83%. Patient slept for 51 minutes. REM supine of 9.4 minutes. AHI was 6.4. At CPAP of  16, 8.1. A CPAP of 15. The patient was recommended to use CPAP at 16. Overall this test is insistent with very severe. Obstructive sleep apnea, sleep-related hypoxemia, CPAP was started at 16.  **Blood gas 02/01/17; 7.39/53/52/32.1. Consistent with chronic hypercapnic and hypoxic respiratory failure.  Medication:    Current Outpatient Medications:  .  acetaminophen (TYLENOL) 500 MG tablet, Take 1 tablet (500 mg total) by mouth every 8 (eight) hours as needed., Disp: 30 tablet, Rfl: 0 .  albuterol (PROVENTIL) (2.5 MG/3ML) 0.083% nebulizer  solution, Take 3 mLs (2.5 mg total) by nebulization every 6 (six) hours as needed for wheezing or shortness of breath., Disp: 150 mL, Rfl: 6 .  aspirin 81 MG tablet, Take 243 mg by mouth daily. Takes 4 70m aspirin daily., Disp: , Rfl:  .  atorvastatin (LIPITOR) 20 MG tablet, Take 1 tablet by mouth daily., Disp: , Rfl:  .  blood glucose meter kit and supplies KIT, Dispense based on patient and insurance preference. Use up to four times daily as directed. (FOR ICD-9 250.00, 250.01)., Disp: 1 each, Rfl: 0 .  clotrimazole (LOTRIMIN) 1 % cream, Apply 1 application 2 (two) times daily topically. For 7 days, Disp: 28 g, Rfl: 0 .  Elastic Bandages & Supports (MEDICAL COMPRESSION SOCKS) MISC, 1 each by Does not apply route daily., Disp: 1 each, Rfl: 0 .  fluticasone furoate-vilanterol (BREO ELLIPTA) 200-25 MCG/INH AEPB, Inhale 1 puff into the lungs daily., Disp: 30 each, Rfl: 1 .  furosemide (LASIX) 40 MG tablet, TAKE 1 TABLET BY MOUTH ONCE DAILY, Disp: 90 tablet, Rfl: 0 .  gabapentin (NEURONTIN) 300 MG capsule, Take 1 capsule (300 mg total) 3 (three) times daily by mouth., Disp: 270 capsule, Rfl: 1 .  hydrochlorothiazide (MICROZIDE) 12.5 MG capsule, Take 1 capsule (12.5 mg total) by mouth daily., Disp: 90 capsule, Rfl: 1 .  ibuprofen (ADVIL,MOTRIN) 800 MG tablet, Take 1 tablet (800 mg total) by mouth every 8 (eight) hours as needed., Disp: 60 tablet, Rfl: 1 .  levofloxacin (LEVAQUIN) 750 MG tablet, , Disp: , Rfl:  .  metFORMIN (GLUCOPHAGE) 1000 MG tablet, Take 1 tablet (1,000 mg total) by mouth 2 (two) times daily with a meal., Disp: 180 tablet, Rfl: 1  Current Facility-Administered Medications:  .  ipratropium-albuterol (DUONEB) 0.5-2.5 (3) MG/3ML nebulizer solution 3 mL, 3 mL, Nebulization, Q6H, Krebs, Amy Lauren, NP   Allergies:  Penicillins  Review of Systems: Gen:  Denies  fever, sweats, chills HEENT: Denies blurred vision, double vision. bleeds, sore throat Cvc:  No dizziness, chest  pain. Resp:   Denies cough or sputum production, shortness of breath Gi: Denies swallowing difficulty, stomach pain. Gu:  Denies bladder incontinence, burning urine Ext:   No Joint pain, stiffness. Skin: No skin rash,  hives  Endoc:  No polyuria, polydipsia. Psych: No depression, insomnia. Other:  All other systems were reviewed with the patient and were negative other that what is mentioned in the HPI.   Physical Examination:   VS: BP 118/70 (BP Location: Left Arm, Cuff Size: Large)   Pulse 72   Ht _0  (1.727 m)   Wt (!) 338 lb (153.3 kg)   SpO2 92%   BMI 51.39 kg/m   General Appearance: No distress  Neuro:without focal findings,  speech normal,  HEENT: PERRLA, EOM intact.   Pulmonary: normal breath sounds, No wheezing.  CardiovascularNormal S1,S2.  No m/r/g.   Abdomen: Benign, Soft, non-tender. Renal:  No costovertebral tenderness  GU:  No performed at  this time. Endoc: No evident thyromegaly, no signs of acromegaly. Skin:   warm, no rashes, no ecchymosis  Extremities: normal, no cyanosis, clubbing.  Other findings:    LABORATORY PANEL:   CBC No results for input(s): WBC, HGB, HCT, PLT in the last 168 hours. ------------------------------------------------------------------------------------------------------------------  Chemistries  No results for input(s): NA, K, CL, CO2, GLUCOSE, BUN, CREATININE, CALCIUM, MG, AST, ALT, ALKPHOS, BILITOT in the last 168 hours.  Invalid input(s): GFRCGP ------------------------------------------------------------------------------------------------------------------  Cardiac Enzymes No results for input(s): TROPONINI in the last 168 hours. ------------------------------------------------------------  RADIOLOGY:  No results found.     Thank  you for the consultation and for allowing [facility] to assist in the care of your patient. Our recommendations are noted above.  Please contact us if we can  be of further service.   Marda Stalker, MD.  Board Certified in Internal Medicine, Pulmonary Medicine, [place], and Sleep Medicine.  [facility] Office Number: [phone]  Patricia Pesa, M.D.  Merton Border, M.D  06/22/2017

## 2017-06-25 ENCOUNTER — Encounter: Payer: Self-pay | Admitting: Internal Medicine

## 2017-06-25 ENCOUNTER — Other Ambulatory Visit: Payer: Self-pay | Admitting: Family Medicine

## 2017-06-25 ENCOUNTER — Ambulatory Visit (INDEPENDENT_AMBULATORY_CARE_PROVIDER_SITE_OTHER): Payer: BC Managed Care – PPO | Admitting: Internal Medicine

## 2017-06-25 VITALS — BP 118/70 | HR 72 | Ht 68.0 in | Wt 338.0 lb

## 2017-06-25 DIAGNOSIS — E662 Morbid (severe) obesity with alveolar hypoventilation: Secondary | ICD-10-CM

## 2017-06-25 DIAGNOSIS — J4540 Moderate persistent asthma, uncomplicated: Secondary | ICD-10-CM

## 2017-06-25 MED ORDER — ALBUTEROL SULFATE HFA 108 (90 BASE) MCG/ACT IN AERS: 1.0000 | INHALATION_SPRAY | RESPIRATORY_TRACT | 5 refills | Status: DC | PRN

## 2017-06-25 MED ORDER — FLUTICASONE FUROATE-VILANTEROL 200-25 MCG/INH IN AEPB: 1.0000 | INHALATION_SPRAY | Freq: Every day | RESPIRATORY_TRACT | 5 refills | Status: DC

## 2017-06-25 MED ORDER — FLUTICASONE FUROATE-VILANTEROL 200-25 MCG/INH IN AEPB: 1.0000 | INHALATION_SPRAY | Freq: Every day | RESPIRATORY_TRACT | 1 refills | Status: DC

## 2017-06-25 NOTE — Patient Instructions (Addendum)
--  use Breo one puff once daily. Do not use more than that! Rinse mouth after use.   --Will prescribe you a rescue inhaler to use as needed.   ----Quitting smoking is the most important thing that you can do for your health.  --Quitting smoking will have greater affect on your health than any medicine that we can give you.

## 2017-06-25 NOTE — Telephone Encounter (Signed)
Pt will ck with ins to see what meter, strips and lancets are covered by his ins and pt will cb with info. CRM done.

## 2017-06-25 NOTE — Telephone Encounter (Signed)
Copied from [place]. Topic: General - Other >> Jun 25, 2017 10:53 AM Lolita Rieger, RMA wrote: Reason for CRM: pt called and would like a script for a blood glucose kit and supplies sent to [facility] on [address]

## 2017-06-28 ENCOUNTER — Ambulatory Visit: Payer: BC Managed Care – PPO | Attending: Internal Medicine

## 2017-06-29 ENCOUNTER — Telehealth: Payer: Self-pay | Admitting: *Deleted

## 2017-06-29 MED ORDER — FLUTICASONE-SALMETEROL 250-50 MCG/DOSE IN AEPB: 1.0000 | INHALATION_SPRAY | Freq: Two times a day (BID) | RESPIRATORY_TRACT | 3 refills | Status: DC

## 2017-06-29 NOTE — Telephone Encounter (Signed)
Can try advair 250 1 puff bid, rinse mouth after use.

## 2017-06-29 NOTE — Telephone Encounter (Signed)
Patient notified

## 2017-06-29 NOTE — Telephone Encounter (Signed)
Patient's insurance has denied Breo 200 mcg. Patient must try and fail at least 2: Advair,Dulera or Symbicort. Please advise of change.    Call ID Ref #: [id number]

## 2017-07-03 ENCOUNTER — Other Ambulatory Visit: Payer: Self-pay | Admitting: Internal Medicine

## 2017-07-04 ENCOUNTER — Ambulatory Visit: Payer: BC Managed Care – PPO | Admitting: Family Medicine

## 2017-07-04 ENCOUNTER — Telehealth: Payer: Self-pay | Admitting: *Deleted

## 2017-07-04 ENCOUNTER — Encounter: Payer: Self-pay | Admitting: Family Medicine

## 2017-07-04 VITALS — BP 136/78 | HR 92 | Temp 98.5°F | Wt 335.8 lb

## 2017-07-04 DIAGNOSIS — N50819 Testicular pain, unspecified: Secondary | ICD-10-CM

## 2017-07-04 DIAGNOSIS — J439 Emphysema, unspecified: Secondary | ICD-10-CM

## 2017-07-04 DIAGNOSIS — E662 Morbid (severe) obesity with alveolar hypoventilation: Secondary | ICD-10-CM | POA: Diagnosis not present

## 2017-07-04 DIAGNOSIS — G4733 Obstructive sleep apnea (adult) (pediatric): Secondary | ICD-10-CM | POA: Diagnosis not present

## 2017-07-04 NOTE — Telephone Encounter (Signed)
Called patient BM:WUXLre:cpap therapy. Pt states someone came on 06/29/17 and took his cpap machine. Pt states he has another one and someone from [facility] was coming to set pressures. Called [facility] and they had no record of patient on file. [facility] called [facility] and [facility] provided nebulizer in 2016 but nothing else. Pt has been using cpap machine without anyone managing. Orders will have to be placed for set up on old machine.

## 2017-07-04 NOTE — Telephone Encounter (Signed)
-----   Message from Shane CrutchPradeep Ramachandran, MD sent at 07/04/2017 11:38 AM EST ----- Regarding: pt using PAP? Pt's PCP states that pt is waiting for PAP to be set up, but per our last note, he is using PAP, can you please see what's happening? Thanks.   ----- Message ----- From: Emi BelfastGessner, Deborah B, FNP Sent: 07/04/2017  10:21 AM To: Shane CrutchPradeep Ramachandran, MD  Hi Dr .Nicholos Johnsamachandran,  I am Jonathon Graham's pcp and saw him today when he mentioned that he still has not gotten his CPAP set up. Given his severe obstruction and co-morbidities I am very concerned about his non compliance with this. He has been telling me for months that someone is scheduled to come out and get his machine set up.  There is a sleep study in the chart from 2014 but there is not enough information for me to put [facility] and set up.  Can you please facilitate this from your office? Warm regards,  Deboraha Sprangebbie Gessner, FNP-BC

## 2017-07-04 NOTE — Telephone Encounter (Signed)
The patient's CPAP was taken away due to inability to pay for it (though exactly what happened is hard to know because he has been very inconsistent in what he has been telling us). He then got a CPAP from his brother, which we instructed him he could use, he could manually set the pressure to 16. We made it clear to him that, because this was his own machine, a DME company would not do this for him, he would need to do it on his own, or he could bring the machine to our office and we could set the pressure for him.  He apparently has not been able to do that, so we are now ordering a new machine for him, however insurance may require a new sleep study to do this.  Hope this helps!

## 2017-07-04 NOTE — Progress Notes (Signed)
Subjective:    Patient ID: Jonathon Graham, male    DOB: 08/30/1955, 62 y.o.   MRN: [id number]  HPI This is a 62 yo male who presents today with longstanding testicular discomfort. States that his testicles have gotten larger and his penis has gotten smaller through the years. He wears incontinence briefs "just in case" of urinary incontinence. Has urge incontinence. Is not generally incontinent. Finds that testicular discomfort improved if he wears supportive clothing instead of very baggy clothing. Denies dysuria, hematuria, frequency, difficulty with urine stream, nausea/vomiting, lower abdominal pain or pressure.   Continues to see pulmonary regularly. Has not had CPAP set up yet by [facility]. Breathing doing better, using oxygen at 2l most of the time. On short term disability and has been approved for Medicaid. Has appointment with SS office regarding permanent disability. He is unable to use portable oxygen at his current job. Has appointment for pulmonary rehab later this month.   Back still bothers him regularly, worse after prolonged sitting. Taking occasional ibuprofen 800 mg and acetaminophen. Doing back exercises. Unable to exercise at the gym because of low O2.     Past Medical History:  Diagnosis Date  . Arthritis   . Benign prostatic hyperplasia (BPH) with urinary urgency    nodular  . COPD (chronic obstructive pulmonary disease) (HCC)   . Depression   . Diabetes mellitus without complication (HCC)   . Essential hypertension   . Fibromyalgia, primary   . Hyperlipidemia   . Hypertension   . Obesity    No past surgical history on file. Family History  Problem Relation Age of Onset  . Heart disease Father   . Diabetes Mellitus II Sister   . Myasthenia gravis Sister   . Fibrocystic breast disease Sister   . Hypertension Sister   . Diabetes Mellitus II Brother    Social History   Tobacco Use  . Smoking status: Former Smoker    Types: Cigarettes    Last  attempt to quit: 03/28/2017    Years since quitting: 0.2  . Smokeless tobacco: Never Used  . Tobacco comment: 3-4 daily  Substance Use Topics  . Alcohol use: No    Alcohol/week: 0.0 oz  . Drug use: No      Review of Systems Per HPI    Objective:   Physical Exam  Constitutional: He is oriented to person, place, and time. He appears well-developed and well-nourished. No distress.  Morbidly obese.   HENT:  Head: Normocephalic and atraumatic.  Eyes: Conjunctivae are normal.  Neck: Normal range of motion. Neck supple.  Pulmonary/Chest: Effort normal. No respiratory distress.  Genitourinary: Penis normal. Right testis shows tenderness. Right testis shows no mass and no swelling. Left testis shows tenderness. Left testis shows no swelling. Circumcised. No phimosis, hypospadias, penile erythema or penile tenderness. No discharge found.  Genitourinary Comments: Large testicles related to body habitus. No masses. No rash. Mild generalized tenderness.   Neurological: He is alert and oriented to person, place, and time.  Skin: Skin is warm and dry. He is not diaphoretic.  Psychiatric: He has a normal mood and affect. His behavior is normal. Judgment and thought content normal.  Vitals reviewed.     BP 136/78   Pulse 92   Temp 98.5 F (36.9 C) (Oral)   Wt (!) 335 lb 12 oz (152.3 kg)   SpO2 92%   BMI 51.05 kg/m  Wt Readings from Last 3 Encounters:  07/04/17 (!) 335 lb 12  oz (152.3 kg)  06/25/17 (!) 338 lb (153.3 kg)  06/01/17 (!) 340 lb (154.2 kg)       Assessment & Plan:  1. Testicular pain - seems related to clothing selection and body habitus. Encouraged him to wear more supportive undergarments - RTC if no improvement  2. Obesity hypoventilation syndrome (HCC) - has lost a little weight, encouraged continued healthy food choices and activity as tolerate  3. Severe obstructive sleep apnea - has been unable to get his CPAP set up. I will send a message to his  pulmonologist to see if they can facilitate.   - follow up as scheduled  Olean Reeeborah Deja Kaigler, FNP-BC  [facility]  07/04/2017 10:05 AM

## 2017-07-04 NOTE — Patient Instructions (Signed)
Please try wearing some more snug underwear to support your testicles.   Keep up the good work with your weight loss.   For your back- keep doing your exercises, can alternate ibuprofen and tylenol every 4 hours.

## 2017-07-05 ENCOUNTER — Telehealth: Payer: Self-pay | Admitting: Internal Medicine

## 2017-07-05 ENCOUNTER — Telehealth: Payer: Self-pay | Admitting: Family Medicine

## 2017-07-05 DIAGNOSIS — G4733 Obstructive sleep apnea (adult) (pediatric): Secondary | ICD-10-CM

## 2017-07-05 DIAGNOSIS — Z9989 Dependence on other enabling machines and devices: Secondary | ICD-10-CM

## 2017-07-05 NOTE — Telephone Encounter (Signed)
Spouse dropped off short term disability earnings form   She stated this needed to be turned in by 2/8  In Debbie's In box

## 2017-07-05 NOTE — Telephone Encounter (Signed)
Message left for patient to return call. RE: split night ordered.

## 2017-07-05 NOTE — Telephone Encounter (Signed)
Due to insurance and cpap machine being owned patient will need a [facility]. Please advise.

## 2017-07-05 NOTE — Telephone Encounter (Signed)
DONE

## 2017-07-05 NOTE — Telephone Encounter (Signed)
[facility] with [facility] calling stating he needs some clarification on CPAP orders   Please call back

## 2017-07-05 NOTE — Telephone Encounter (Signed)
Ok, please place order for split night (or HST, whichever is covered).

## 2017-07-06 NOTE — Telephone Encounter (Signed)
Paperwork faxed pt aware °Copy for pt °Copy for scan °

## 2017-07-06 NOTE — Telephone Encounter (Signed)
Copied from CRM [id number]. Topic: General - Other >> Jul 06, 2017 11:16 AM Gerrianne ScalePayne, Angela L wrote: Reason for CRM: patient calling to see if paper work is ready to be picked up he states that he has left it with Leone PayorGessner and it has to be turned in by tomorrow

## 2017-07-06 NOTE — Telephone Encounter (Signed)
Form completed and placed in my inbox.

## 2017-07-10 ENCOUNTER — Telehealth: Payer: Self-pay | Admitting: *Deleted

## 2017-07-10 NOTE — Telephone Encounter (Signed)
[employer] approved Valid thru 06/22/19 [place] [id number]

## 2017-07-13 ENCOUNTER — Ambulatory Visit: Payer: BC Managed Care – PPO | Admitting: Family Medicine

## 2017-07-13 DIAGNOSIS — Z0289 Encounter for other administrative examinations: Secondary | ICD-10-CM

## 2017-07-24 ENCOUNTER — Ambulatory Visit: Payer: BC Managed Care – PPO | Attending: Internal Medicine

## 2017-07-24 ENCOUNTER — Encounter: Payer: Self-pay | Admitting: Internal Medicine

## 2017-07-24 DIAGNOSIS — G4733 Obstructive sleep apnea (adult) (pediatric): Secondary | ICD-10-CM | POA: Diagnosis present

## 2017-07-24 DIAGNOSIS — Z9989 Dependence on other enabling machines and devices: Principal | ICD-10-CM

## 2017-07-27 DIAGNOSIS — G4733 Obstructive sleep apnea (adult) (pediatric): Secondary | ICD-10-CM | POA: Diagnosis not present

## 2017-07-30 ENCOUNTER — Encounter: Payer: Self-pay | Admitting: Family Medicine

## 2017-07-30 ENCOUNTER — Telehealth: Payer: Self-pay | Admitting: *Deleted

## 2017-07-30 ENCOUNTER — Ambulatory Visit (INDEPENDENT_AMBULATORY_CARE_PROVIDER_SITE_OTHER): Payer: BC Managed Care – PPO | Admitting: Family Medicine

## 2017-07-30 VITALS — BP 138/66 | HR 50 | Temp 98.0°F | Wt 334.0 lb

## 2017-07-30 DIAGNOSIS — E662 Morbid (severe) obesity with alveolar hypoventilation: Secondary | ICD-10-CM | POA: Diagnosis not present

## 2017-07-30 DIAGNOSIS — G8929 Other chronic pain: Secondary | ICD-10-CM | POA: Diagnosis not present

## 2017-07-30 DIAGNOSIS — M5441 Lumbago with sciatica, right side: Secondary | ICD-10-CM

## 2017-07-30 DIAGNOSIS — G4733 Obstructive sleep apnea (adult) (pediatric): Secondary | ICD-10-CM | POA: Diagnosis not present

## 2017-07-30 DIAGNOSIS — I10 Essential (primary) hypertension: Secondary | ICD-10-CM

## 2017-07-30 DIAGNOSIS — M5442 Lumbago with sciatica, left side: Secondary | ICD-10-CM | POA: Diagnosis not present

## 2017-07-30 DIAGNOSIS — E1142 Type 2 diabetes mellitus with diabetic polyneuropathy: Secondary | ICD-10-CM

## 2017-07-30 NOTE — Addendum Note (Signed)
Addended by: Alvina ChouWALSH, Twylia Oka J on: 07/30/2017 12:55 PM   Modules accepted: Orders

## 2017-07-30 NOTE — Telephone Encounter (Signed)
Pt aware of results. Orders placed  Nothing further needed. 

## 2017-07-30 NOTE — Progress Notes (Signed)
Subjective:    Patient ID: Jonathon Graham, male    DOB: Jun 15, 1955, 62 y.o.   MRN: [id number]  HPI This is a 62 yo male who presents today with back pain. This has been a chronic problem. He has been doing back stretches, has been trying to walk more. AM stiffness, feels weak, walking and bending make worse. Had injections many years ago. Temporary relief with ibuprofen/tylenol. No relief with topical patches, some with [employer] arthritis cream. No recent falls. Has new aching pain in bilateral hands. No numbness or tingling. CLINICAL DATA:  left leg swelling and pain. He reports this started 2-3 months ago. He describes the pain as tight and burning. He does have a little numbness, but denies tingling or weakness. He reports associated low back pain that started around the same time, but he is not sure if it is related. He denies any injury to his back or legs. He is obese, has a history of fibromyalgia, and is taking Neurontin as prescribed. He has tried Ibuprofen, heat and ice without any relief. 6/18 Lumbar xray showed- EXAM: LUMBAR SPINE - COMPLETE 4+ VIEW COMPARISON:  02/05/2015 FINDINGS: Facet DJD L4-5 and L5-S1. Mild grade 1 anterolisthesis L4-5, stable. Mild narrowing of the L4-5 interspace. No fracture or other acute worrisome bone lesion. IMPRESSION: 1. Negative for fracture or other acute finding. 2. Spondylitic changes L4-S1 as above.  Had new sleep study and is waiting for CPAP machine. Occasionally wakes in the night and feels SOB, able to relax and take some deep breaths with improvement. Wearing oxygen at night and as needed during the day. No cough.    Past Medical History:  Diagnosis Date  . Arthritis   . Benign prostatic hyperplasia (BPH) with urinary urgency    nodular  . COPD (chronic obstructive pulmonary disease) (HCC)   . Depression   . Diabetes mellitus without complication (HCC)   . Essential hypertension   . Fibromyalgia, primary   . Hyperlipidemia    . Hypertension   . Obesity    No past surgical history on file. Family History  Problem Relation Age of Onset  . Heart disease Father   . Diabetes Mellitus II Sister   . Myasthenia gravis Sister   . Fibrocystic breast disease Sister   . Hypertension Sister   . Diabetes Mellitus II Brother    Social History   Tobacco Use  . Smoking status: Former Smoker    Types: Cigarettes    Last attempt to quit: 03/28/2017    Years since quitting: 0.3  . Smokeless tobacco: Never Used  . Tobacco comment: 3-4 daily  Substance Use Topics  . Alcohol use: No    Alcohol/week: 0.0 oz  . Drug use: No      Review of Systems Per HPI    Objective:   Physical Exam  Constitutional: He is oriented to person, place, and time. He appears well-developed and well-nourished. No distress.  Morbidly obese.   HENT:  Head: Normocephalic and atraumatic.  Eyes: Conjunctivae are normal.  Neck: Normal range of motion. Neck supple.  Cardiovascular: Normal rate, regular rhythm and normal heart sounds.  Pulmonary/Chest: Effort normal and breath sounds normal.  Musculoskeletal: He exhibits no edema.       Cervical back: He exhibits no tenderness and no bony tenderness.       Thoracic back: He exhibits no tenderness and no bony tenderness.       Lumbar back: He exhibits decreased range of motion, tenderness  and bony tenderness.  Generalized tenderness of low back. Good UE/LE strength.   Neurological: He is alert and oriented to person, place, and time.  Skin: Skin is warm and dry. He is not diaphoretic.  Psychiatric: He has a normal mood and affect. His behavior is normal. Judgment and thought content normal.  Vitals reviewed.     BP 138/66   Pulse (!) 50   Temp 98 F (36.7 C) (Oral)   Wt (!) 341 lb 12 oz (155 kg)   SpO2 94%   BMI 51.96 kg/m  Wt Readings from Last 3 Encounters:  07/30/17 (!) 334 lb (151.5 kg)  07/04/17 (!) 335 lb 12 oz (152.3 kg)  06/25/17 (!) 338 lb (153.3 kg)   Repeat  pulse ox 95% (off O2) and HR 55    Assessment & Plan:  1. Chronic bilateral low back pain with bilateral sciatica - ongoing problem that is certainly exacerbated by morbid obesitiy - Ambulatory referral to Orthopedic Surgery  2. Type 2 diabetes, controlled, with peripheral neuropathy (HCC) - Has lost a few pounds and reports working on his diet, have suggested diabetes education but patient states he is unable to afford co-pay at this time - Hemoglobin A1c  3. Severe obstructive sleep apnea - pulmonary working on getting his CPAP  4. Essential hypertension - well controlled, continue current meds  5. Obesity hypoventilation syndrome (HCC) - follow up with pulmonary - have encouraged him to participate in pulmonary rehab as has pulmonary, but patient states he is unable to afford at this time  - follow up in 3 months - will extend short term disability   [name], FNP-BC  [facility], [facility]  07/30/2017 9:07 AM

## 2017-07-30 NOTE — Patient Instructions (Addendum)
Good to see you today, please stop and see Jonathon Graham to schedule your appointment with orthopedic specialist  Please stop at the lab  Follow up in 3 months  Keep working on weight loss which will be easier once you start using your CPAP

## 2017-07-31 ENCOUNTER — Telehealth: Payer: Self-pay | Admitting: *Deleted

## 2017-07-31 DIAGNOSIS — G4733 Obstructive sleep apnea (adult) (pediatric): Secondary | ICD-10-CM

## 2017-07-31 NOTE — Telephone Encounter (Signed)
Pt aware of results of sleep study. Orders placed  Nothing further needed. 

## 2017-08-02 ENCOUNTER — Other Ambulatory Visit: Payer: Self-pay | Admitting: Orthopedic Surgery

## 2017-08-02 DIAGNOSIS — M5441 Lumbago with sciatica, right side: Principal | ICD-10-CM

## 2017-08-02 DIAGNOSIS — G8929 Other chronic pain: Secondary | ICD-10-CM

## 2017-08-02 DIAGNOSIS — M5442 Lumbago with sciatica, left side: Principal | ICD-10-CM

## 2017-08-03 ENCOUNTER — Telehealth: Payer: Self-pay | Admitting: Family Medicine

## 2017-08-03 NOTE — Telephone Encounter (Signed)
Pts wife dropped off short-term disability forms to be signed and faxed today (ASAP). She also wanted me to let you know that he is scheduled for an MRI on Monday. Placed forms on cart.

## 2017-08-03 NOTE — Telephone Encounter (Signed)
Form faxed to [place] [employer].

## 2017-08-03 NOTE — Telephone Encounter (Signed)
MRI is schedule for 08/14/17

## 2017-08-03 NOTE — Telephone Encounter (Signed)
Forms competed

## 2017-08-06 NOTE — Telephone Encounter (Signed)
Spouse left extra forms to be faxed every month.  Spoke with pt 08/06/17.  He is aware that he will need to call me every month prior to paperwork needing to be faxed.  Pt is aware to call me 3-5 days prior to needing paperwork faxed

## 2017-08-13 ENCOUNTER — Encounter: Payer: Self-pay | Admitting: Family Medicine

## 2017-08-13 ENCOUNTER — Ambulatory Visit (INDEPENDENT_AMBULATORY_CARE_PROVIDER_SITE_OTHER): Payer: BC Managed Care – PPO | Admitting: Family Medicine

## 2017-08-13 VITALS — BP 128/76 | HR 85 | Temp 98.1°F | Wt 334.0 lb

## 2017-08-13 DIAGNOSIS — I10 Essential (primary) hypertension: Secondary | ICD-10-CM | POA: Diagnosis not present

## 2017-08-13 DIAGNOSIS — M79642 Pain in left hand: Secondary | ICD-10-CM

## 2017-08-13 DIAGNOSIS — M5441 Lumbago with sciatica, right side: Secondary | ICD-10-CM

## 2017-08-13 DIAGNOSIS — G8929 Other chronic pain: Secondary | ICD-10-CM | POA: Diagnosis not present

## 2017-08-13 DIAGNOSIS — M79641 Pain in right hand: Secondary | ICD-10-CM | POA: Diagnosis not present

## 2017-08-13 DIAGNOSIS — N401 Enlarged prostate with lower urinary tract symptoms: Secondary | ICD-10-CM

## 2017-08-13 DIAGNOSIS — G4733 Obstructive sleep apnea (adult) (pediatric): Secondary | ICD-10-CM | POA: Diagnosis not present

## 2017-08-13 DIAGNOSIS — R351 Nocturia: Secondary | ICD-10-CM | POA: Diagnosis not present

## 2017-08-13 DIAGNOSIS — M5442 Lumbago with sciatica, left side: Secondary | ICD-10-CM | POA: Diagnosis not present

## 2017-08-13 DIAGNOSIS — N3941 Urge incontinence: Secondary | ICD-10-CM | POA: Diagnosis not present

## 2017-08-13 LAB — POC URINALSYSI DIPSTICK (AUTOMATED)
Bilirubin, UA: NEGATIVE
Glucose, UA: NEGATIVE
Ketones, UA: NEGATIVE
Leukocytes, UA: NEGATIVE
Nitrite, UA: NEGATIVE
Protein, UA: NEGATIVE
Spec Grav, UA: 1.02 (ref 1.010–1.025)
Urobilinogen, UA: 0.2 E.U./dL
pH, UA: 6 (ref 5.0–8.0)

## 2017-08-13 MED ORDER — IBUPROFEN 800 MG PO TABS: 800.0000 mg | ORAL_TABLET | Freq: Three times a day (TID) | ORAL | 1 refills | Status: DC | PRN

## 2017-08-13 MED ORDER — TAMSULOSIN HCL 0.4 MG PO CAPS: 0.4000 mg | ORAL_CAPSULE | Freq: Every day | ORAL | 2 refills | Status: DC

## 2017-08-13 MED ORDER — FUROSEMIDE 40 MG PO TABS: 40.0000 mg | ORAL_TABLET | Freq: Every day | ORAL | 0 refills | Status: DC

## 2017-08-13 NOTE — Patient Instructions (Signed)
Can take acetaminophen (tylenol) - two tablets every 8 to 12 hours for pain  For hand pain, please do gentle range of motion exercises 2-3 times a day.  Get an over the counter wrist brace to see if giving it extra support will help. Can also wrap with an ace wrap.   Your urine does not show any bacteria.

## 2017-08-13 NOTE — Progress Notes (Signed)
Subjective:    Patient ID: Jonathon Graham, male    DOB: [birth year], 62 y.o.   MRN: [id number]  HPI This is a 62 yo male who presents today with bilateral hand pain. Worse after driving long distances, hands feel week. This is an ongoing problem for him . Has seen orthopedist and was diagnosed with severe lumbar arthritis and hip pain. Has been prescribed 6 weeks of PT and is considering injections (patient not in favor of these, has had in past without results). Also had bariatric referral placed. In past, he was unable to afford nutrition consult, DM education or pulmonary rehab, he is hoping with addition of Medicaid benefits that he will have adequate coverage.   Had new sleep study. Was called by [facility] and trying to get equipment this week.   Has a history of BPH and feels like urinary incontinence is worsening. Urge incontinence. Has to wear incontinence briefs. Weak stream, doesn't feel like he empties all the way. Nocturia 2-4 times nightly. No dysuria. No hematuria. Has been on Flomax in the past with good results. Generic did not work for him. He is interested in going back on Flomax, had to stop because it became unaffordable.   Pulmonary- stable, no cough, doing well on Advair daily. Using albuterol 2-3 times per week. On oxygen as needed for activity and sleep.   Past Medical History:  Diagnosis Date  . Arthritis   . Benign prostatic hyperplasia (BPH) with urinary urgency    nodular  . COPD (chronic obstructive pulmonary disease) (HCC)   . Depression   . Diabetes mellitus without complication (HCC)   . Essential hypertension   . Fibromyalgia, primary   . Hyperlipidemia   . Hypertension   . Obesity    No past surgical history on file. Family History  Problem Relation Age of Onset  . Heart disease Father   . Diabetes Mellitus II Sister   . Myasthenia gravis Sister   . Fibrocystic breast disease Sister   . Hypertension Sister   . Diabetes Mellitus II Brother     Social History   Tobacco Use  . Smoking status: Former Smoker    Types: Cigarettes    Last attempt to quit: 03/28/2017    Years since quitting: 0.3  . Smokeless tobacco: Never Used  . Tobacco comment: 3-4 daily  Substance Use Topics  . Alcohol use: No    Alcohol/week: 0.0 oz  . Drug use: No     Review of Systems Per HPI    Objective:   Physical Exam  Constitutional: He is oriented to person, place, and time. He appears well-developed and well-nourished. No distress.  Morbidly obese.   HENT:  Head: Normocephalic and atraumatic.  Eyes: Conjunctivae are normal.  Cardiovascular: Normal rate.  Pulmonary/Chest: Effort normal.  Musculoskeletal:  Bilateral hands with mild, generalized swelling. Good strenght and ROM. Mild tenderness to palpation of dorsal hand and distal forearms. Pain with wrist flexion and extension. No warmth. No finger swelling.   Neurological: He is alert and oriented to person, place, and time.  Skin: Skin is warm and dry. He is not diaphoretic.  Psychiatric: He has a normal mood and affect. His behavior is normal. Judgment and thought content normal.  Vitals reviewed.     BP 128/76   Pulse 85   Temp 98.1 F (36.7 C) (Oral)   Wt (!) 334 lb (151.5 kg)   SpO2 100%   BMI 50.78 kg/m  Wt Readings from  Last 3 Encounters:  08/13/17 (!) 334 lb (151.5 kg)  07/30/17 (!) 334 lb (151.5 kg)  07/04/17 (!) 335 lb 12 oz (152.3 kg)   Results for orders placed or performed in visit on 08/13/17  POCT Urinalysis Dipstick (Automated)  Result Value Ref Range   Color, UA yellow    Clarity, UA clear    Glucose, UA neg    Bilirubin, UA neg    Ketones, UA neg    Spec Grav, UA 1.020 1.010 - 1.025   Blood, UA ne    pH, UA 6.0 5.0 - 8.0   Protein, UA neg    Urobilinogen, UA 0.2 0.2 or 1.0 E.U./dL   Nitrite, UA neg    Leukocytes, UA Negative Negative       Assessment & Plan:  1. Chronic bilateral low back pain with bilateral sciatica - continue follow up  with ortho, PT per them - ibuprofen (ADVIL,MOTRIN) 800 MG tablet; Take 1 tablet (800 mg total) by mouth every 8 (eight) hours as needed.  Dispense: 60 tablet; Refill: 1  2. Urge incontinence - negative urinalysis, likely related to BPH, diuretic use - POCT Urinalysis Dipstick (Automated)  3. Essential hypertension - well controlled on current meds - furosemide (LASIX) 40 MG tablet; Take 1 tablet (40 mg total) by mouth daily.  Dispense: 90 tablet; Refill: 0  4. Benign prostatic hyperplasia with nocturia - Flomax 0.4 mg po qd- not sure that brand name will be covered - may need urology follow up  5. Severe obstructive sleep apnea - will hopefully get CPAP this week  6. Bilateral hand pain - likely osteoarthritis given back, knee osteo - continue occasional ibuprofen use, can alternate with acetaminophen - suggested he try OTC wrist support/ ROM - follow up as scheduled   [name], FNP-BC  [facility], [facility]  08/13/2017 10:06 AM

## 2017-08-14 ENCOUNTER — Ambulatory Visit: Admission: RE | Admit: 2017-08-14 | Payer: BC Managed Care – PPO | Source: Ambulatory Visit

## 2017-08-22 ENCOUNTER — Telehealth: Payer: Self-pay

## 2017-08-22 NOTE — Telephone Encounter (Signed)
Copied from CRM [id number]. Topic: Inquiry >> Aug 22, 2017  5:05 PM Raquel SarnaHayes, Teresa G wrote: Pt said job hasn't received the paper work for his short term disability. Pt is checking on status.  Please call pt back.

## 2017-08-23 NOTE — Telephone Encounter (Signed)
Jonathon Graham had a copy of paperwork, and has re-faxed. Spoke to pt and advised. Copy also [facility] and [facility]

## 2017-08-23 NOTE — Telephone Encounter (Signed)
I recently completed his paperwork, but I don't see it in the media section of his chart. Do we have a copy of it at the [facility]?

## 2017-08-23 NOTE — Telephone Encounter (Signed)
Paperwork refaxed.

## 2017-08-29 ENCOUNTER — Telehealth: Payer: Self-pay | Admitting: Family Medicine

## 2017-08-29 NOTE — Telephone Encounter (Signed)
Forms completed and returned to Robin.  

## 2017-08-29 NOTE — Telephone Encounter (Signed)
Spouse dropped off continuance of disability form attending physician statement to be filled out  Also reporting earnings for in folder   In debbie's in box for review and signature

## 2017-08-29 NOTE — Telephone Encounter (Signed)
Copied from CRM [id number]. Topic: General - Other >> Aug 29, 2017  9:50 AM Percival SpanishKennedy, Cheryl W wrote:  Pt wife dropped off some paperwork today 08/29/17 and pt said do not fax the paperwork call him and he will come pick up   [phone]

## 2017-08-30 ENCOUNTER — Telehealth: Payer: Self-pay | Admitting: Family Medicine

## 2017-08-30 NOTE — Telephone Encounter (Signed)
Jonathon HatchetSheila dropped in to let you know Mellody DanceKeith is taking therapy for back and legs @ [facility] twice a week x 6 wks.

## 2017-08-30 NOTE — Telephone Encounter (Signed)
Pt aware reporting earnings for short term paperwork has been faxed He is aware attending physician statement is ready but he has forms he needs to fill out  He will come by to pick up paperwork and will turn in himself Copy for pt Copy for scan

## 2017-09-13 ENCOUNTER — Other Ambulatory Visit: Payer: Self-pay | Admitting: Internal Medicine

## 2017-09-13 ENCOUNTER — Telehealth: Payer: Self-pay | Admitting: Family Medicine

## 2017-09-13 DIAGNOSIS — E119 Type 2 diabetes mellitus without complications: Secondary | ICD-10-CM

## 2017-09-13 NOTE — Telephone Encounter (Unsigned)
Copied from CRM [phone]. Topic: Quick Communication - Rx Refill/Question >> Sep 13, 2017  1:41 PM Floria RavelingStovall, Shana A wrote: Medication: metFORMIN (GLUCOPHAGE) 1000 MG tablet [[id number]][[id number]]  Has the patient contacted their pharmacy? Yes  (Agent: If no, request that the patient contact the pharmacy for the refill.) Preferred Pharmacy (with phone number or street name):  [facility] [place]  Agent: Please be advised that RX refills may take up to 3 business days. We ask that you follow-up with your pharmacy.

## 2017-10-03 ENCOUNTER — Telehealth: Payer: Self-pay | Admitting: Emergency Medicine

## 2017-10-03 ENCOUNTER — Telehealth: Payer: Self-pay | Admitting: Family Medicine

## 2017-10-03 ENCOUNTER — Other Ambulatory Visit: Payer: Self-pay | Admitting: Family Medicine

## 2017-10-03 DIAGNOSIS — E1142 Type 2 diabetes mellitus with diabetic polyneuropathy: Secondary | ICD-10-CM

## 2017-10-03 MED ORDER — GABAPENTIN 300 MG PO CAPS
300.0000 mg | ORAL_CAPSULE | Freq: Three times a day (TID) | ORAL | 1 refills | Status: DC
Start: 2017-10-03 — End: 2018-06-24

## 2017-10-03 NOTE — Telephone Encounter (Signed)
Reporting earnings for short term disability benefits and medical report In Debbie's in box

## 2017-10-03 NOTE — Telephone Encounter (Signed)
Returned to [employer].

## 2017-10-03 NOTE — Telephone Encounter (Signed)
Paperwork faxed °

## 2017-10-03 NOTE — Telephone Encounter (Signed)
Form completed. Prescription refilled.

## 2017-10-03 NOTE — Telephone Encounter (Signed)
Please Advise.      Copied from CRM [id number]. Topic: General - Other >> Oct 02, 2017 12:50 PM Elliot Gault wrote: Relation to pt: self  Call back number: [phone] Pharmacy: [facility] [address] [place]), [place] - [address] [phone] (Phone) [phone] (Fax)  Reason for call:  Patient states he was advised by "Robin" to call with a friendly reminder regarding patient short term disability. Patient would like to discuss, please advise.   Patient requesting gabapentin (NEURONTIN) 300 MG capsule refill, patient contacted pharmacy, informed patient please allow 48 to 72 hour turn around, please advise   >> Oct 02, 2017 12:55 PM Elliot Gault wrote: Call back number: [phone] Pharmacy: [facility] [address] [place]), [place] - [address] [phone] (Phone) [phone] (Fax)  Reason for call:  Patient states he was advised by "Robin" to call with a friendly reminder regarding patient short term disability. Patient would like to discuss, please advise.   Patient requesting gabapentin (NEURONTIN) 300 MG capsule refill, patient contacted pharmacy, informed patient please allow 48 to 72 hour turn around, please advise    >> Oct 03, 2017  8:25 AM Carrie Mew B wrote: Pt aware I gave paperwork to debbie for signature I will call him when paperwork has been faxed

## 2017-10-04 NOTE — Telephone Encounter (Signed)
Pt aware °Copy for scan °Copy for pt °

## 2017-10-05 ENCOUNTER — Ambulatory Visit: Payer: BC Managed Care – PPO | Admitting: Family Medicine

## 2017-10-05 DIAGNOSIS — Z0289 Encounter for other administrative examinations: Secondary | ICD-10-CM

## 2017-10-10 ENCOUNTER — Encounter: Payer: Self-pay | Admitting: Family Medicine

## 2017-10-10 ENCOUNTER — Ambulatory Visit (INDEPENDENT_AMBULATORY_CARE_PROVIDER_SITE_OTHER): Payer: BC Managed Care – PPO | Admitting: Family Medicine

## 2017-10-10 VITALS — BP 110/72 | HR 73 | Wt 328.8 lb

## 2017-10-10 DIAGNOSIS — E1142 Type 2 diabetes mellitus with diabetic polyneuropathy: Secondary | ICD-10-CM | POA: Diagnosis not present

## 2017-10-10 DIAGNOSIS — F4321 Adjustment disorder with depressed mood: Secondary | ICD-10-CM

## 2017-10-10 LAB — POCT GLYCOSYLATED HEMOGLOBIN (HGB A1C): Hemoglobin A1C: 6.6

## 2017-10-10 NOTE — Progress Notes (Signed)
   Subjective:    Patient ID: Jonathon Graham, male    DOB: [birth year], 62 y.o.   MRN: [id number]  HPI This is a 62 yo male who presents today following the loss of his sister. She died 10-12-22. Having difficulty sleeping, decreased appetite.   He has been going to PT for his back. Coming along. Some improvement with pain.   Picking up his CPAP next week. Breathing "comes and goes," decreased pulse ox with walking stairs. Wearing oxygen prn. Has follow up with pulmonary.   Will become a [place] citizen next month.  Past Medical History:  Diagnosis Date  . Arthritis   . Benign prostatic hyperplasia (BPH) with urinary urgency    nodular  . COPD (chronic obstructive pulmonary disease) (HCC)   . Depression   . Diabetes mellitus without complication (HCC)   . Essential hypertension   . Fibromyalgia, primary   . Hyperlipidemia   . Hypertension   . Obesity    No past surgical history on file. Family History  Problem Relation Age of Onset  . Heart disease Father   . Diabetes Mellitus II Sister   . Myasthenia gravis Sister   . Fibrocystic breast disease Sister   . Hypertension Sister   . Diabetes Mellitus II Brother    Social History   Tobacco Use  . Smoking status: Former Smoker    Types: Cigarettes    Last attempt to quit: 03/28/2017    Years since quitting: 0.5  . Smokeless tobacco: Never Used  . Tobacco comment: 3-4 daily  Substance Use Topics  . Alcohol use: No    Alcohol/week: 0.0 oz  . Drug use: No      Review of Systems Per HPI    Objective:   Physical Exam  Constitutional: He is oriented to person, place, and time. He appears well-developed and well-nourished. No distress.  Morbidly obese.   HENT:  Head: Normocephalic and atraumatic.  Eyes: Conjunctivae are normal.  Cardiovascular: Normal rate, regular rhythm and normal heart sounds.  Pulmonary/Chest: Effort normal and breath sounds normal. No stridor. No respiratory distress. He has no wheezes. He has no  rales.  Neurological: He is alert and oriented to person, place, and time.  Skin: Skin is warm and dry. He is not diaphoretic.  Psychiatric: He has a normal mood and affect. His behavior is normal. Judgment and thought content normal.  Vitals reviewed.     BP 110/72 (BP Location: Left Arm, Patient Position: Sitting, Cuff Size: Large)   Pulse 73   Wt (!) 328 lb 12 oz (149.1 kg)   SpO2 90%   BMI 49.99 kg/m  Wt Readings from Last 3 Encounters:  10/10/17 (!) 328 lb 12 oz (149.1 kg)  08/13/17 (!) 334 lb (151.5 kg)  07/30/17 (!) 334 lb (151.5 kg)       Assessment & Plan:  1. Type 2 diabetes, controlled, with peripheral neuropathy (HCC) - Due for HgbA1C today, has lost a little weight, encouraged continued healthy food choices - POCT glycosylated hemoglobin (Hb A1C)  2. Grief reaction - discussed grief process with patient and provided information about grief - therapeutic listening  - follow up on file   [name], FNP-BC  [facility], [facility]  10/10/2017 10:22 AM

## 2017-10-10 NOTE — Patient Instructions (Signed)
Please stop at the lab to check your blood sugar   Coping With Loss, Adult People experience loss in many different ways throughout their lives. Events such as moving, changing jobs, and losing friends can create a sense of loss. The loss may be as serious as a major health change, divorce, death of a pet, or death of a loved one. All of these types of loss are likely to create a physical and emotional reaction known as grief. Grief is the result of a major change or an absence of something or someone that you count on. Grief is a normal reaction to loss. How to recognize changes A variety of factors can affect your grieving experience, including:  The nature of your loss.  Your relationship to what or whom you lost.  Your understanding of grief and how to cope with it.  Your support system.  The way that you deal with your grief will affect your ability to function as you normally do. When you are grieving, you may experience:  Numbness, shock, sadness, anxiety, anger, denial, and guilt.  Thoughts about death.  Unexpected crying.  A physical sensation of emptiness in your gut.  Problems sleeping and eating.  Fatigue.  Loss of interest in normal activities.  Dreaming about or imagining seeing the person who died.  A need to remember what or whom you lost.  Difficulty thinking about anything other than your loss for a period of time.  Relief. If you have been expecting the loss for a while, you may feel a sense of relief when it happens.  Where to find support To get support for coping with loss:  Ask your health care provider for help and recommendations, such as grief counseling or therapy.  Think about joining a support group for people who are coping with loss.  Follow these instructions at home:  Be patient with yourself and others. Allow the grieving process to happen, and remember that grieving takes time. ? It is likely that you may never feel completely done  with some grief. You may find a way to move on while still cherishing memories and feelings about your loss. ? Accepting your loss is a process. It can take months or longer to adjust.  Express your feelings in healthy ways, such as: ? Talking with others about your loss. It may be helpful to find others who have had a similar loss, such as a support group. ? Writing down your feelings in a journal. ? Doing physical activities to release stress and emotional energy. ? Doing creative activities like painting, sculpting, or playing or listening to music. ? Practicing resilience. This is the ability to recover and adjust after facing challenges. Reading some resources that encourage resilience may help you to learn ways to practice those behaviors.  Keep to your normal routine as much as possible. If you have trouble focusing or doing normal activities, it is acceptable to take some time away from your normal routine.  Spend time with friends and loved ones.  Eat a healthy diet, get plenty of sleep, and rest when you feel tired. Where to find more information: You can find more information about coping with loss from:  American Society of Clinical Oncology: www.cancer.net  American Psychological Association: [employer].ca  Contact a health care provider if:  Your grief is extreme and keeps getting worse.  You have ongoing grief that does not improve.  Your body shows symptoms of grief, such as illness.  You feel  depressed, anxious, or lonely. Get help right away if:  You have thoughts about hurting yourself or others. If you ever feel like you may hurt yourself or others, or have thoughts about taking your own life, get help right away. You can go to your nearest emergency department or call:  Your local emergency services (911 in the [place]  A suicide crisis helpline, such as the National Suicide Prevention Lifeline at 803-559-3562. This is open 24 hours a day.  Summary  Grief  is a normal part of experiencing a loss. It is the result of a major change or an absence of something or someone that you count on.  The depth of grief and the period of recovery depend on the type of loss as well as your ability to adjust to the change and process your feelings.  Processing grief requires patience and a willingness to accept your feelings and talk about your loss with people who are supportive.  It is important to find resources that work for you and to realize that we are all different when it comes to grief. There is not one single grieving process that works for everyone in the same way.  Be aware that when grief becomes extreme, it can lead to more severe issues like isolation, depression, anxiety, or suicidal thoughts. Talk with your health care provider if you have any of these issues. This information is not intended to replace advice given to you by your health care provider. Make sure you discuss any questions you have with your health care provider. Document Released: 09/28/2016 Document Revised: 09/28/2016 Document Reviewed: 09/28/2016 Elsevier Interactive Patient Education  2018 [employer].

## 2017-10-31 ENCOUNTER — Ambulatory Visit: Payer: BC Managed Care – PPO | Admitting: Family Medicine

## 2017-10-31 DIAGNOSIS — Z0289 Encounter for other administrative examinations: Secondary | ICD-10-CM

## 2017-11-05 ENCOUNTER — Telehealth: Payer: Self-pay | Admitting: Family Medicine

## 2017-11-05 NOTE — Telephone Encounter (Signed)
Pt aware paperwork has been faxedc Copy for pt Copy for file

## 2017-11-05 NOTE — Telephone Encounter (Signed)
Reporting earnings for short term disability

## 2017-11-05 NOTE — Telephone Encounter (Signed)
Signed and placed in out basket.

## 2017-11-19 ENCOUNTER — Other Ambulatory Visit: Payer: Self-pay | Admitting: Family Medicine

## 2017-11-19 DIAGNOSIS — I10 Essential (primary) hypertension: Secondary | ICD-10-CM

## 2017-11-19 DIAGNOSIS — R351 Nocturia: Secondary | ICD-10-CM

## 2017-11-19 DIAGNOSIS — N401 Enlarged prostate with lower urinary tract symptoms: Secondary | ICD-10-CM

## 2017-11-21 ENCOUNTER — Encounter: Payer: Self-pay | Admitting: Family Medicine

## 2017-11-21 ENCOUNTER — Telehealth: Payer: Self-pay | Admitting: Family Medicine

## 2017-11-21 ENCOUNTER — Ambulatory Visit (INDEPENDENT_AMBULATORY_CARE_PROVIDER_SITE_OTHER): Payer: BC Managed Care – PPO | Admitting: Family Medicine

## 2017-11-21 ENCOUNTER — Ambulatory Visit: Payer: Self-pay | Admitting: Family Medicine

## 2017-11-21 VITALS — BP 104/60 | HR 54 | Temp 97.9°F | Ht 68.0 in | Wt 327.2 lb

## 2017-11-21 DIAGNOSIS — G8929 Other chronic pain: Secondary | ICD-10-CM

## 2017-11-21 DIAGNOSIS — M5441 Lumbago with sciatica, right side: Secondary | ICD-10-CM

## 2017-11-21 DIAGNOSIS — M5442 Lumbago with sciatica, left side: Secondary | ICD-10-CM

## 2017-11-21 DIAGNOSIS — N401 Enlarged prostate with lower urinary tract symptoms: Secondary | ICD-10-CM | POA: Diagnosis not present

## 2017-11-21 DIAGNOSIS — G4733 Obstructive sleep apnea (adult) (pediatric): Secondary | ICD-10-CM | POA: Diagnosis not present

## 2017-11-21 DIAGNOSIS — R351 Nocturia: Secondary | ICD-10-CM | POA: Diagnosis not present

## 2017-11-21 MED ORDER — OXYCODONE-ACETAMINOPHEN 5-325 MG PO TABS: 1.0000 | ORAL_TABLET | Freq: Two times a day (BID) | ORAL | 0 refills | Status: DC | PRN

## 2017-11-21 MED ORDER — OXYCODONE-ACETAMINOPHEN 5-300 MG PO TABS: 1.0000 | ORAL_TABLET | Freq: Two times a day (BID) | ORAL | 0 refills | Status: DC | PRN

## 2017-11-21 NOTE — Telephone Encounter (Signed)
Copied from CRM [id number]. Topic: Quick Communication - See Telephone Encounter >> Nov 21, 2017 10:49 AM Lorrine KinMcGee, Anokhi Shannon B, NT wrote: CRM for notification. See Telephone encounter for: 11/21/17. [facility] and states that he does not have the oxycodone-acetaminophen (LYNOX) 5-300 MG tablet. He was wanting to know if it could be changed to the 5-325mg ? Please advise. CB#: [phone]

## 2017-11-21 NOTE — Progress Notes (Signed)
Subjective:    Patient ID: Jonathon Graham, male    DOB: June 29, 1955, 62 y.o.   MRN: [id number]  HPI This is a 62 yo male, accompanied by his wife, who presents today with low back pain. Has intermittently been going to PT, has had difficulty with scheduling because they are so busy. Has follow up with Dr. Floyce Stakes in July. MRI was ordered and patient has not had.Taking gabapentin 300 mg TID. Does not get relief with tramadol, gets better relief with percocet.   Has rash on bilateral elbows. Noticed it decreased when he stopped tamsulosin. Applies cocoa butter.   Has noticed left hand swelling and shoulder, elbow, wrist aching. Sleeps on left side.   OSA- he reports that his CPAP was denied. Does not have follow up with pulmonary and has not gotten in touch with them.   SOB- improving, does have some desats to 80s if he is without his oxygen too long.   Past Medical History:  Diagnosis Date  . Arthritis   . Benign prostatic hyperplasia (BPH) with urinary urgency    nodular  . COPD (chronic obstructive pulmonary disease) (HCC)   . Depression   . Diabetes mellitus without complication (HCC)   . Essential hypertension   . Fibromyalgia, primary   . Hyperlipidemia   . Hypertension   . Obesity    No past surgical history on file. Family History  Problem Relation Age of Onset  . Heart disease Father   . Diabetes Mellitus II Sister   . Myasthenia gravis Sister   . Fibrocystic breast disease Sister   . Hypertension Sister   . Diabetes Mellitus II Brother    Social History   Tobacco Use  . Smoking status: Former Smoker    Types: Cigarettes    Last attempt to quit: 03/28/2017    Years since quitting: 0.6  . Smokeless tobacco: Never Used  . Tobacco comment: 3-4 daily  Substance Use Topics  . Alcohol use: No    Alcohol/week: 0.0 oz  . Drug use: No      Review of Systems Per HPI    Objective:   Physical Exam  Constitutional: He appears well-developed and well-nourished.  No distress.  Morbidly obese.   HENT:  Head: Normocephalic and atraumatic.  Cardiovascular: Normal rate, regular rhythm and normal heart sounds.  Pulmonary/Chest: Effort normal and breath sounds normal.  Skin: Skin is warm and dry. He is not diaphoretic.  Bilateral elbow extensor surface hyperpigmentation.   Psychiatric: He has a normal mood and affect. His behavior is normal. Judgment and thought content normal.  Vitals reviewed.     BP 104/60 (BP Location: Left Arm, Patient Position: Sitting, Cuff Size: Large)   Pulse (!) 54   Temp 97.9 F (36.6 C) (Oral)   Ht 5\' 8"  (1.727 m)   Wt (!) 327 lb 4 oz (148.4 kg)   SpO2 94%   BMI 49.76 kg/m  Wt Readings from Last 3 Encounters:  11/21/17 (!) 327 lb 4 oz (148.4 kg)  10/10/17 (!) 328 lb 12 oz (149.1 kg)  08/13/17 (!) 334 lb (151.5 kg)       Assessment & Plan:  1. Chronic bilateral low back pain with bilateral sciatica - continue gabapentin, discussed need for follow up with neurosurgery and to get MRI and consider injections per last note - agreed to given him a very small amount of pain medication to use for severe pain only - oxyCODONE-acetaminophen (PERCOCET/ROXICET) 5-325 MG tablet; Take 1 tablet  by mouth 2 (two) times daily as needed for severe pain.  Dispense: 10 tablet; Refill: 0  2. Benign prostatic hyperplasia with nocturia - reports improved currently with his diuretics, no improvement with tamsulosin- can do a trial off  3. Severe obstructive sleep apnea - unclear why he has been unable to get approved for CPAP - he was instructed to follow up with pulmonary - discussed importance of treating his severe OSA to promote weight loss, improved pulmonary status and pain control.   4. Morbid obesity (HCC) - has lost a few pounds, there was a referral to bariatric surgery, not sure what has happened with this  - There have been multiple attempts (by multiple providers) to get patient plugged into services including  pulmonary rehab, nutrition, bariatrics. I explained to patient and his wife that his improvement is dependent on being pro active and following through with diagnostic tests and prescribed therapies. He told me he would call Dr. Floyce StakesGaines office to get MRI and follow up scheduled and that he would call Dr. Carolyn Stareamachandran's office to inquire about his CPAP and schedule follow up.    Olean Reeeborah Gessner, FNP-BC  [facility], [facility]  11/22/2017 9:46 AM

## 2017-11-21 NOTE — Telephone Encounter (Signed)
Called and spoke with charles at pharmacy. Nothing further needed at this time.

## 2017-11-21 NOTE — Patient Instructions (Addendum)
Good to see you today  Please schedule follow up with Dr. Nicholos Johnsamachandran and Dr. Floyce StakesGaines  Call Dr. Floyce StakesGaines office and inquire about MRI that was scheduled in March.   Follow up in 4 months

## 2017-11-21 NOTE — Telephone Encounter (Signed)
Please call pharmacist and let them know that I sent in new prescription. To please cancel original one.

## 2017-11-22 ENCOUNTER — Telehealth: Payer: Self-pay | Admitting: Family Medicine

## 2017-11-22 ENCOUNTER — Encounter: Payer: Self-pay | Admitting: Family Medicine

## 2017-11-22 NOTE — Telephone Encounter (Signed)
Copied from CRM [id number]. Topic: Quick Communication - See Telephone Encounter >> Nov 22, 2017  5:41 PM Lorrine KinMcGee, Nija Koopman B, NT wrote: CRM for notification. See Telephone encounter for: 11/22/17. Patient calling in regards to his short term disability. States that the dates on the paperwork that was filled out were incorrect and the insurance company is not paying him for this month. CB#: [phone]

## 2017-11-23 NOTE — Telephone Encounter (Signed)
Paperwork faxed Per pt request copied mailed to him Copy for scan

## 2017-11-23 NOTE — Telephone Encounter (Signed)
Spoke with pt he stated question 5 needs to be 1 year not 6 months. Please initial and date changes   Paperwork in debbie's in box

## 2017-11-23 NOTE — Telephone Encounter (Signed)
Initialed and dated.

## 2017-11-23 NOTE — Telephone Encounter (Signed)
Please Advise

## 2017-12-06 ENCOUNTER — Other Ambulatory Visit: Payer: Self-pay | Admitting: Family Medicine

## 2017-12-06 NOTE — Telephone Encounter (Signed)
Debbie's pt

## 2017-12-13 ENCOUNTER — Other Ambulatory Visit: Payer: Self-pay | Admitting: *Deleted

## 2017-12-13 DIAGNOSIS — J449 Chronic obstructive pulmonary disease, unspecified: Secondary | ICD-10-CM

## 2017-12-20 ENCOUNTER — Ambulatory Visit: Payer: BC Managed Care – PPO | Attending: Internal Medicine

## 2017-12-20 DIAGNOSIS — J449 Chronic obstructive pulmonary disease, unspecified: Secondary | ICD-10-CM

## 2017-12-20 MED ORDER — ALBUTEROL SULFATE (2.5 MG/3ML) 0.083% IN NEBU
2.5000 mg | INHALATION_SOLUTION | Freq: Once | RESPIRATORY_TRACT | Status: AC
  Administered 2017-12-20: 2.5 mg via RESPIRATORY_TRACT
  Filled 2017-12-20: qty 3

## 2017-12-20 NOTE — Progress Notes (Signed)
[facility] Consultation      Assessment and Plan:  62 yo male with sleep apnea, morbid obesity, dyspnea, possible obesity hypoventilation.   Chronic bronchitis, group B.  --With recent exacerbation requiring hospitalization.  --Continue advair albuterol as needed.  --Smoking cessation.   Tobacco abuse.  --Discussed the importance of continued smoking cessation which will help with his breathing >3 min.   OSA  -Diagnosis of sleep apnea but waiting for him to get a new PAP machine, he also has another CPAP at home but has not started using it.  -Previous CPAP was set at 16, will request that his oxygen supply company set his CPAP.  He tells me that his CPAP is currently set at 14, asked that he start using it regardless of whether set or not, as this should give him some benefit.  Insomnia with excessive daytime sleepiness. 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 -He has multiple etiologies for his insomnia and daytime sleepiness, which may include peripheral neuropathy, poor sleep hygiene, anxiety. He may also have some degree of circadian rhythm disorder given his early rising time at 2:30 AM. -Advised him not to take naps during the day, we discussed improvement of sleep hygiene.   Obesity -Severe morbid obesity with BMI of 51, this may be contributing to obesity hypoventilation syndrome and elevated CO2 levels. -We'll check ABG.  Dyspnea with restrictive lung disease, likely due to obesity. -Patient is currently on advair but using less since losing weight.  -His dyspnea may be multifactorial from asthma, as well as severe morbid obesity. Discussed the importance of weight loss going forwards.    Date: 12/20/2017  MRN# [id number] Tou Hayner Aug 25, 1955    Ladarren Steiner is a 62 y.o. old male seen in consultation for chief complaint of:    Chief Complaint  Patient presents with  . Asthma    pt wears 2L 02 qhs. SOB has improved, patient had PFT.  Marland Kitchen Sleep  Apnea    pt still has not received new machine from [facility]. He is not on therapy    HPI:   The patient is a 62 yo male with a history of OSA, severe daytime sleepiness-- he could not afford CPAP therefore it was taken away.  He had tried using his brother CPAP machine, but could not get it to work. Upon review of his old sleep study he was advised to set it at 16 cm H2O. He was also noted to have insomnia and on several medications which could be causing daytime sleepiness. He was also admitted to the hospital for COPD exacerbation and discharged on oxygen. However he is not allowed to use oxygen at work.   At last visit he was sent for a new sleep study to requalify for CPAP.  He was recommended to continue Breo, albuterol, he was advised smoking cessation, weight loss.  Today he notes that he is still not on cpap or bipap. He has been losing weight. He feels that his breathing has been better and he has been trying to do more exercise. He has not been using albuterol, he takes advair hfa about once per week. He is still smoking but very little, about once cig per day or so. He continues to have issues with insomnia.   **Pulmonary function test 12/20/2017>> FVC 65% predicted, FEV1 63% predicted.  There is no improvement with bronchodilator therapy.  Flow volume loop appears unremarkable.  TLC 78% predicted.  Ratio was normal.  Diffusion capacity is moderately reduced  at 54%. -Overall this test shows moderate restrictive lung disease. **ABG 02/01/17>>7.39/53/52/31-- chronic hypercapnic and hypoxic respiratory failure.  **CT chest 11/19/2016>> appears lung volume secondary to body habitus with bibasilar atelectasis, otherwise lungs are unremarkable.  Review of outside sleep study 04/17/13; split-night AHI equals 101.4 consistent with very severe sleep apnea, no central apnea noted. Patient was started on CPAP, titrated to a pressure of 16. At this pressure lowest sat was 83%. Patient slept for 51 minutes.  REM supine of 9.4 minutes. AHI was 6.4. At CPAP of 16, 8.1. A CPAP of 15. The patient was recommended to use CPAP at 16. Overall this test is insistent with very severe. Obstructive sleep apnea, sleep-related hypoxemia, CPAP was started at 16.  **Blood gas 02/01/17>> 7.39/53/52/32.1. Consistent with chronic hypercapnic and hypoxic respiratory failure.  Medication:    Current Outpatient Medications:  .  acetaminophen (TYLENOL) 500 MG tablet, Take 1 tablet (500 mg total) by mouth every 8 (eight) hours as needed., Disp: 30 tablet, Rfl: 0 .  albuterol (PROAIR HFA) 108 (90 Base) MCG/ACT inhaler, Inhale 1-2 puffs into the lungs every 4 (four) hours as needed for wheezing or shortness of breath., Disp: 1 Inhaler, Rfl: 5 .  albuterol (PROVENTIL) (2.5 MG/3ML) 0.083% nebulizer solution, Take 3 mLs (2.5 mg total) by nebulization every 6 (six) hours as needed for wheezing or shortness of breath., Disp: 150 mL, Rfl: 6 .  aspirin 81 MG tablet, Take 243 mg by mouth daily. Takes 4 24m aspirin daily., Disp: , Rfl:  .  Aspirin-Calcium Carbonate (BAYER WOMENS) [id number] TABS, Take by mouth., Disp: , Rfl:  .  atorvastatin (LIPITOR) 20 MG tablet, TAKE 1 TABLET BY MOUTH ONCE DAILY, Disp: 30 tablet, Rfl: 11 .  blood glucose meter kit and supplies KIT, Dispense based on patient and insurance preference. Use up to four times daily as directed. (FOR ICD-9 250.00, 250.01)., Disp: 1 each, Rfl: 0 .  clotrimazole (LOTRIMIN) 1 % cream, Apply 1 application 2 (two) times daily topically. For 7 days, Disp: 28 g, Rfl: 0 .  Elastic Bandages & Supports (MEDICAL COMPRESSION SOCKS) MISC, 1 each by Does not apply route daily., Disp: 1 each, Rfl: 0 .  Fluticasone-Salmeterol (ADVAIR DISKUS) 250-50 MCG/DOSE AEPB, Inhale 1 puff into the lungs 2 (two) times daily., Disp: 60 each, Rfl: 3 .  furosemide (LASIX) 40 MG tablet, TAKE 1 TABLET BY MOUTH ONCE DAILY, Disp: 90 tablet, Rfl: 0 .  gabapentin (NEURONTIN) 300 MG capsule, Take 1 capsule (300 mg  total) by mouth 3 (three) times daily., Disp: 270 capsule, Rfl: 1 .  hydrochlorothiazide (MICROZIDE) 12.5 MG capsule, Take 1 capsule (12.5 mg total) by mouth daily., Disp: 90 capsule, Rfl: 1 .  ibuprofen (ADVIL,MOTRIN) 800 MG tablet, Take 1 tablet (800 mg total) by mouth every 8 (eight) hours as needed., Disp: 60 tablet, Rfl: 1 .  metFORMIN (GLUCOPHAGE) 1000 MG tablet, TAKE 1 TABLET BY MOUTH TWICE DAILY WITH A MEAL, Disp: 180 tablet, Rfl: 0 .  oxyCODONE-acetaminophen (PERCOCET/ROXICET) 5-325 MG tablet, Take 1 tablet by mouth 2 (two) times daily as needed for severe pain., Disp: 10 tablet, Rfl: 0 .  tamsulosin (FLOMAX) 0.4 MG CAPS capsule, TAKE 1 CAPSULE BY MOUTH ONCE DAILY, Disp: 30 capsule, Rfl: 2 .  traMADol (ULTRAM) 50 MG tablet, Take by mouth., Disp: , Rfl:   Current Facility-Administered Medications:  .  ipratropium-albuterol (DUONEB) 0.5-2.5 (3) MG/3ML nebulizer solution 3 mL, 3 mL, Nebulization, Q6H, Krebs, Amy Lauren, NP   Allergies:  Penicillins  LABORATORY PANEL:   CBC No results for input(s): WBC, HGB, HCT, PLT in the last 168 hours. ------------------------------------------------------------------------------------------------------------------  Chemistries  No results for input(s): NA, K, CL, CO2, GLUCOSE, BUN, CREATININE, CALCIUM, MG, AST, ALT, ALKPHOS, BILITOT in the last 168 hours.  Invalid input(s): GFRCGP ------------------------------------------------------------------------------------------------------------------  Cardiac Enzymes No results for input(s): TROPONINI in the last 168 hours. ------------------------------------------------------------  RADIOLOGY:  No results found.     Thank  you for the consultation and for allowing [facility] to assist in the care of your patient. Our recommendations are noted above.  Please contact us if we can be of further service.   Marda Stalker, M.D., F.C.C.P.  Board Certified  in Internal Medicine, Pulmonary Medicine, [place], and Sleep Medicine.  [facility] Office Number: [phone]   12/20/2017

## 2017-12-21 ENCOUNTER — Encounter: Payer: Self-pay | Admitting: Internal Medicine

## 2017-12-21 ENCOUNTER — Ambulatory Visit (INDEPENDENT_AMBULATORY_CARE_PROVIDER_SITE_OTHER): Payer: BC Managed Care – PPO | Admitting: Internal Medicine

## 2017-12-21 VITALS — BP 128/68 | HR 62 | Resp 16 | Ht 68.0 in | Wt 319.0 lb

## 2017-12-21 DIAGNOSIS — J4540 Moderate persistent asthma, uncomplicated: Secondary | ICD-10-CM

## 2017-12-21 DIAGNOSIS — G4733 Obstructive sleep apnea (adult) (pediatric): Secondary | ICD-10-CM

## 2017-12-21 DIAGNOSIS — E662 Morbid (severe) obesity with alveolar hypoventilation: Secondary | ICD-10-CM

## 2017-12-21 NOTE — Patient Instructions (Signed)
Continue to work on smoking cessation, weight loss, and increasing your exercise.

## 2017-12-31 ENCOUNTER — Other Ambulatory Visit: Payer: Self-pay | Admitting: Family Medicine

## 2017-12-31 ENCOUNTER — Other Ambulatory Visit: Payer: Self-pay | Admitting: *Deleted

## 2017-12-31 DIAGNOSIS — G8929 Other chronic pain: Secondary | ICD-10-CM

## 2017-12-31 DIAGNOSIS — M5441 Lumbago with sciatica, right side: Principal | ICD-10-CM

## 2017-12-31 DIAGNOSIS — M5442 Lumbago with sciatica, left side: Principal | ICD-10-CM

## 2017-12-31 MED ORDER — HYDROCHLOROTHIAZIDE 12.5 MG PO CAPS: 12.5000 mg | ORAL_CAPSULE | Freq: Every day | ORAL | 0 refills | Status: DC

## 2017-12-31 NOTE — Telephone Encounter (Signed)
Ibuprofen refill Last Refill:08/13/17 #60 with one refill Last OV: 11/21/17 PCP: Kaleen Maskeoborah Gessner,NP [facility] [address]

## 2017-12-31 NOTE — Telephone Encounter (Signed)
Pt called to request refills on ibuprofen and HCTZ stating he requested from the pharmacy and was advised to f/u with the doctors office. Pt will be out 01/01/18.  [facility] [address]

## 2018-01-04 ENCOUNTER — Telehealth: Payer: Self-pay | Admitting: Family Medicine

## 2018-01-04 NOTE — Telephone Encounter (Signed)
Paperwork in debbie's in box  For review and signature  Copied from CRM [id number]. Topic: Inquiry >> Jan 03, 2018 11:28 AM Arlyss Gandyichardson, Taren N, NT wrote: Reason for CRM: Pt states that he needs the form that is faxed every month faxed for his short term disability. [facility]. He states he was told to call Zella BallRobin every month to remind her.

## 2018-01-04 NOTE — Telephone Encounter (Signed)
Paperwork faxed ° °Pt aware °Copy for pt °Copy for scan °

## 2018-01-04 NOTE — Telephone Encounter (Signed)
Form signed and returned to Robin. 

## 2018-01-07 ENCOUNTER — Ambulatory Visit: Payer: Self-pay | Admitting: Family Medicine

## 2018-01-09 ENCOUNTER — Encounter: Payer: Self-pay | Admitting: Family Medicine

## 2018-01-09 ENCOUNTER — Ambulatory Visit (INDEPENDENT_AMBULATORY_CARE_PROVIDER_SITE_OTHER): Payer: BC Managed Care – PPO | Admitting: Family Medicine

## 2018-01-09 VITALS — BP 100/60 | HR 76 | Temp 98.6°F | Ht 68.0 in | Wt 312.5 lb

## 2018-01-09 DIAGNOSIS — M5441 Lumbago with sciatica, right side: Secondary | ICD-10-CM

## 2018-01-09 DIAGNOSIS — E1142 Type 2 diabetes mellitus with diabetic polyneuropathy: Secondary | ICD-10-CM

## 2018-01-09 DIAGNOSIS — I10 Essential (primary) hypertension: Secondary | ICD-10-CM | POA: Diagnosis not present

## 2018-01-09 DIAGNOSIS — G4733 Obstructive sleep apnea (adult) (pediatric): Secondary | ICD-10-CM | POA: Diagnosis not present

## 2018-01-09 DIAGNOSIS — G8929 Other chronic pain: Secondary | ICD-10-CM

## 2018-01-09 DIAGNOSIS — E7800 Pure hypercholesterolemia, unspecified: Secondary | ICD-10-CM

## 2018-01-09 DIAGNOSIS — M5442 Lumbago with sciatica, left side: Secondary | ICD-10-CM

## 2018-01-09 NOTE — Addendum Note (Signed)
Addended by: Alvina ChouWALSH, Zaylan Kissoon J on: 01/09/2018 02:59 PM   Modules accepted: Orders

## 2018-01-09 NOTE — Addendum Note (Signed)
Addended by: Alvina ChouWALSH, TERRI J on: 01/09/2018 02:56 PM   Modules accepted: Orders

## 2018-01-09 NOTE — Progress Notes (Signed)
Subjective:    Patient ID: Jonathon Graham, male    DOB: Jun 07, 1955, 62 y.o.   MRN: [id number]  HPI This is a 62 yo male who presents today for follow up of chronic medical conditions. Recently saw pulmonologist Nicholos Johns(Ramachandran) and had repeat PFTs that showed moderate restrictive lung disease.  Per patient, Dr. Nicholos Johnsamachandran doesn't think he will be able to resume previous job due to physical nature and chemical exposure. Uses oxygen intermittently, breathing worse with heat and humidity. Short term disability expires in October, will be filing for long term disability and is waiting to hear about SSI/disability.   Doing physical therapy 2x/ week and home exercises.   Having oral surgery on gums tomorrow. Local anesthetic to be used.   His mother who lives with he and his wife, had a heart attack recently.   Continues to have back and hip pain. Forgot to call about his [facility]. Some improvement with PT.   Past Medical History:  Diagnosis Date  . Arthritis   . Benign prostatic hyperplasia (BPH) with urinary urgency    nodular  . COPD (chronic obstructive pulmonary disease) (HCC)   . Depression   . Diabetes mellitus without complication (HCC)   . Essential hypertension   . Fibromyalgia, primary   . Hyperlipidemia   . Hypertension   . Obesity    No past surgical history on file. Family History  Problem Relation Age of Onset  . Heart disease Father   . Diabetes Mellitus II Sister   . Myasthenia gravis Sister   . Fibrocystic breast disease Sister   . Hypertension Sister   . Diabetes Mellitus II Brother    Social History   Tobacco Use  . Smoking status: Former Smoker    Types: Cigarettes    Last attempt to quit: 03/28/2017    Years since quitting: 0.7  . Smokeless tobacco: Never Used  . Tobacco comment: 3-4 daily  Substance Use Topics  . Alcohol use: No    Alcohol/week: 0.0 standard drinks  . Drug use: No      Review of  Systems Per HPI    Objective:   Physical Exam  Constitutional: He is oriented to person, place, and time. He appears well-developed and well-nourished. No distress.  Morbidly obese.   HENT:  Head: Normocephalic and atraumatic.  Cardiovascular: Normal rate, regular rhythm and normal heart sounds.  Pulmonary/Chest: Effort normal and breath sounds normal.  Musculoskeletal: He exhibits edema (trace pretibial).  Neurological: He is alert and oriented to person, place, and time.  Skin: Skin is warm and dry. He is not diaphoretic.  Chronic hyperpigmentation of LE. 1 cm area scabbing left shin, appears to be healing, no erythema or drainage.   Psychiatric: He has a normal mood and affect. His behavior is normal. Judgment and thought content normal.  Vitals reviewed.    BP 100/60 (BP Location: Right Arm, Patient Position: Sitting, Cuff Size: Large)   Pulse 76   Temp 98.6 F (37 C) (Oral)   Ht 5\' 8"  (1.727 m)   Wt (!) 312 lb 8 oz (141.7 kg)   SpO2 96%   BMI 47.52 kg/m  Wt Readings from Last 3 Encounters:  01/09/18 (!) 312 lb 8 oz (141.7 kg)  12/21/17 (!) 319 lb (144.7 kg)  11/21/17 (!) 327 lb 4 oz (148.4 kg)        Assessment & Plan:  1. Type 2 diabetes, controlled, with peripheral neuropathy (HCC) -  Hemoglobin A1c  2. Morbid obesity (HCC) - discussed weight loss goals, encouraged his healthy choices and increased exercise - CBC with Differential - Basic Metabolic Panel  3. Severe obstructive sleep apnea - getting fitted for CPAP this week - CBC with Differential - Basic Metabolic Panel  4. Essential hypertension - well controlled - Lipid Panel  5. Pure hypercholesterolemia - Lipid Panel  6. Chronic bilateral low back pain with bilateral sciatica - continue PT, encouraged him to follow up with ortho who wanted MRI of back  -follow up in 3-4 months Olean Reeeborah Jachin Coury, FNP-BC  [facility], [facility]  01/09/2018 1:27 PM

## 2018-01-09 NOTE — Patient Instructions (Addendum)
Good to see you today- keep up the good work with your healthy food choices and exercise  Tomorrow before your dental surgery, use your albuterol inhaler and take with you to use as needed for cough, wheeze, shortness of breath  Call [facility] office to see if you can get your MRI scheduled that was ordered earlier this year  [phone] Jonathon Graham(Thomas Gaines ordered the test)  Follow up in 3-4 months, sooner if needed

## 2018-01-11 ENCOUNTER — Other Ambulatory Visit: Payer: Self-pay

## 2018-01-14 ENCOUNTER — Other Ambulatory Visit (INDEPENDENT_AMBULATORY_CARE_PROVIDER_SITE_OTHER): Payer: BC Managed Care – PPO

## 2018-01-14 DIAGNOSIS — G8929 Other chronic pain: Secondary | ICD-10-CM

## 2018-01-14 DIAGNOSIS — M5441 Lumbago with sciatica, right side: Secondary | ICD-10-CM

## 2018-01-14 DIAGNOSIS — E7800 Pure hypercholesterolemia, unspecified: Secondary | ICD-10-CM

## 2018-01-14 DIAGNOSIS — E1142 Type 2 diabetes mellitus with diabetic polyneuropathy: Secondary | ICD-10-CM

## 2018-01-14 DIAGNOSIS — M5442 Lumbago with sciatica, left side: Secondary | ICD-10-CM

## 2018-01-14 DIAGNOSIS — I10 Essential (primary) hypertension: Secondary | ICD-10-CM

## 2018-01-14 LAB — LIPID PANEL
Cholesterol: 99 mg/dL (ref 0–200)
HDL: 40.8 mg/dL (ref >39.00)
LDL Cholesterol: 46 mg/dL (ref 0–99)
NonHDL: 58.19
Total CHOL/HDL Ratio: 2
Triglycerides: 62 mg/dL (ref 0.0–149.0)
VLDL: 12.4 mg/dL (ref 0.0–40.0)

## 2018-01-14 LAB — CBC WITH DIFFERENTIAL/PLATELET
Basophils Absolute: 0 10*3/uL (ref 0.0–0.1)
Basophils Relative: 0.4 % (ref 0.0–3.0)
Eosinophils Absolute: 0.2 10*3/uL (ref 0.0–0.7)
Eosinophils Relative: 2.3 % (ref 0.0–5.0)
HCT: 42.8 % (ref 39.0–52.0)
Hemoglobin: 14 g/dL (ref 13.0–17.0)
Lymphocytes Relative: 24.9 % (ref 12.0–46.0)
Lymphs Abs: 1.8 10*3/uL (ref 0.7–4.0)
MCHC: 32.7 g/dL (ref 30.0–36.0)
MCV: 87.1 fl (ref 78.0–100.0)
Monocytes Absolute: 0.6 10*3/uL (ref 0.1–1.0)
Monocytes Relative: 7.9 % (ref 3.0–12.0)
Neutro Abs: 4.6 10*3/uL (ref 1.4–7.7)
Neutrophils Relative %: 64.5 % (ref 43.0–77.0)
Platelets: 221 10*3/uL (ref 150.0–400.0)
RBC: 4.91 Mil/uL (ref 4.22–5.81)
RDW: 15.5 % (ref 11.5–15.5)
WBC: 7.2 10*3/uL (ref 4.0–10.5)

## 2018-01-14 LAB — BASIC METABOLIC PANEL
BUN: 19 mg/dL (ref 6–23)
CO2: 35 mEq/L — ABNORMAL HIGH (ref 19–32)
Calcium: 9.8 mg/dL (ref 8.4–10.5)
Chloride: 97 mEq/L (ref 96–112)
Creatinine, Ser: 0.91 mg/dL (ref 0.40–1.50)
GFR: 109.42 mL/min (ref >60.00)
Glucose, Bld: 134 mg/dL — ABNORMAL HIGH (ref 70–99)
Potassium: 3.5 mEq/L (ref 3.5–5.1)
Sodium: 141 mEq/L (ref 135–145)

## 2018-01-14 LAB — HEMOGLOBIN A1C: Hgb A1c MFr Bld: 7 % — ABNORMAL HIGH (ref 4.6–6.5)

## 2018-02-01 ENCOUNTER — Other Ambulatory Visit: Payer: Self-pay | Admitting: Physician Assistant

## 2018-02-01 DIAGNOSIS — M5442 Lumbago with sciatica, left side: Principal | ICD-10-CM

## 2018-02-01 DIAGNOSIS — M5441 Lumbago with sciatica, right side: Secondary | ICD-10-CM

## 2018-02-06 ENCOUNTER — Telehealth: Payer: Self-pay | Admitting: Family Medicine

## 2018-02-06 NOTE — Telephone Encounter (Signed)
Completed and returned to [place] to be faxed.

## 2018-02-06 NOTE — Telephone Encounter (Signed)
Pt came in asking about his short term disability paperwork. Zella Ball is out of office until 9/16 so I filled out and placed in your in box to be signed. It is due by 4pm today. Pt said work said it was approved until 06/12/18 and asked that you change date if you agree.

## 2018-02-11 ENCOUNTER — Ambulatory Visit: Payer: Self-pay | Admitting: Family Medicine

## 2018-02-12 NOTE — Telephone Encounter (Signed)
I faxed and pt is aware.

## 2018-02-13 ENCOUNTER — Other Ambulatory Visit: Payer: Self-pay | Admitting: Family Medicine

## 2018-02-13 DIAGNOSIS — I10 Essential (primary) hypertension: Secondary | ICD-10-CM

## 2018-02-13 NOTE — Telephone Encounter (Signed)
Electronic refill request Last office visit 01/09/18 Last refill 11/20/17 #90 No show 02/11/18 No upcoming appointment scheduled

## 2018-02-14 ENCOUNTER — Ambulatory Visit
Admission: RE | Admit: 2018-02-14 | Discharge: 2018-02-14 | Disposition: A | Discharge status: Home / Self Care | Payer: BC Managed Care – PPO | Source: Ambulatory Visit | Attending: Physician Assistant | Admitting: Physician Assistant

## 2018-02-14 DIAGNOSIS — M5442 Lumbago with sciatica, left side: Secondary | ICD-10-CM | POA: Insufficient documentation

## 2018-02-14 DIAGNOSIS — M5441 Lumbago with sciatica, right side: Secondary | ICD-10-CM | POA: Insufficient documentation

## 2018-02-14 DIAGNOSIS — M5127 Other intervertebral disc displacement, lumbosacral region: Secondary | ICD-10-CM | POA: Diagnosis not present

## 2018-02-14 DIAGNOSIS — M48061 Spinal stenosis, lumbar region without neurogenic claudication: Secondary | ICD-10-CM | POA: Diagnosis not present

## 2018-02-20 ENCOUNTER — Other Ambulatory Visit: Payer: Self-pay | Admitting: Internal Medicine

## 2018-02-20 DIAGNOSIS — E119 Type 2 diabetes mellitus without complications: Secondary | ICD-10-CM

## 2018-02-22 ENCOUNTER — Other Ambulatory Visit: Payer: Self-pay | Admitting: Family Medicine

## 2018-02-22 DIAGNOSIS — E119 Type 2 diabetes mellitus without complications: Secondary | ICD-10-CM

## 2018-02-22 MED ORDER — METFORMIN HCL 1000 MG PO TABS: ORAL_TABLET | ORAL | 0 refills | Status: DC

## 2018-02-22 NOTE — Telephone Encounter (Signed)
Metformin 1000mg  refill Last Refill:09/13/17 # 180 Last OV: 01/09/18 PCP: Elberta Leatherwood [facility]acy:[facility] 

## 2018-02-22 NOTE — Telephone Encounter (Signed)
Pt last seen 01/09/18 and pt was to return in 3 mths. Refilled x 1 with note pt needs to schedule [facility] [address]. I spoke with pt and if he does not discuss diabetes at appt next wk pt will schedule FU in Nov. [facility]

## 2018-02-22 NOTE — Telephone Encounter (Signed)
Copied from CRM [id number]. Topic: Quick Communication - Rx Refill/Question >> Feb 22, 2018 11:21 AM Gloriann Loan L wrote: Medication: metFORMIN (GLUCOPHAGE) 1000 MG tablet pt ran out yesterday   Has the patient contacted their pharmacy? Yes.   (Agent: If no, request that the patient contact the pharmacy for the refill.) (Agent: If yes, when and what did the pharmacy advise?) reached out to pharmacy on Monday now pt is out od medicine  Preferred Pharmacy (with phone number or street name):     [facility] [address]), [place] - [address] [phone] (Phone) [phone] (Fax)    Agent: Please be advised that RX refills may take up to 3 business days. We ask that you follow-up with your pharmacy.

## 2018-03-01 ENCOUNTER — Ambulatory Visit: Payer: Self-pay | Admitting: Family Medicine

## 2018-03-07 NOTE — Telephone Encounter (Signed)
Spoke with pt advise him Jonathon Graham was not in office today and I will put paperwork in Jonathon Graham's in box to be reviewed and signed.  Also ask pt to call the office 5-6 days prior to needing paperwork sent in.

## 2018-03-07 NOTE — Telephone Encounter (Signed)
Pt states he needs his short term disability sent every month on the 10th. He is requesting for these to be sent to [facility].

## 2018-03-08 NOTE — Telephone Encounter (Signed)
Copy for pt °Copy for scan °

## 2018-03-08 NOTE — Telephone Encounter (Signed)
Paperwork faxed °

## 2018-03-08 NOTE — Telephone Encounter (Signed)
Signed and returned

## 2018-03-08 NOTE — Telephone Encounter (Signed)
Pt aware.

## 2018-03-10 ENCOUNTER — Encounter: Payer: Self-pay | Admitting: Internal Medicine

## 2018-03-15 ENCOUNTER — Telehealth: Payer: Self-pay | Admitting: Family Medicine

## 2018-03-15 NOTE — Telephone Encounter (Signed)
Pt dropped off disability continuance claim forms. Placed in Rx tower

## 2018-03-18 NOTE — Telephone Encounter (Signed)
Attending physician statement In debbie's in box

## 2018-03-19 NOTE — Telephone Encounter (Signed)
Patient would like to know could he have his paper work done by tomorrow.

## 2018-03-20 ENCOUNTER — Ambulatory Visit: Payer: Self-pay | Admitting: Family Medicine

## 2018-03-20 NOTE — Telephone Encounter (Signed)
Completed and returned to Robin's desk.

## 2018-03-21 NOTE — Telephone Encounter (Signed)
Pt is aware paperwork Is ready for pick up Pt is aware he needs to fill out his part  He will need to fill out and turn in.  I attached a list of medications for pt to turn in with paperwork if he wants

## 2018-03-27 ENCOUNTER — Encounter: Payer: Self-pay | Admitting: Family Medicine

## 2018-03-27 ENCOUNTER — Ambulatory Visit (INDEPENDENT_AMBULATORY_CARE_PROVIDER_SITE_OTHER): Payer: BC Managed Care – PPO | Admitting: Family Medicine

## 2018-03-27 VITALS — BP 100/66 | HR 73 | Temp 97.3°F | Ht 68.0 in | Wt 311.8 lb

## 2018-03-27 DIAGNOSIS — Z23 Encounter for immunization: Secondary | ICD-10-CM | POA: Diagnosis not present

## 2018-03-27 DIAGNOSIS — R21 Rash and other nonspecific skin eruption: Secondary | ICD-10-CM

## 2018-03-27 DIAGNOSIS — M150 Primary generalized (osteo)arthritis: Secondary | ICD-10-CM | POA: Diagnosis not present

## 2018-03-27 DIAGNOSIS — E662 Morbid (severe) obesity with alveolar hypoventilation: Secondary | ICD-10-CM | POA: Diagnosis not present

## 2018-03-27 DIAGNOSIS — M8949 Other hypertrophic osteoarthropathy, multiple sites: Secondary | ICD-10-CM

## 2018-03-27 DIAGNOSIS — M159 Polyosteoarthritis, unspecified: Secondary | ICD-10-CM

## 2018-03-27 MED ORDER — DICLOFENAC SODIUM 75 MG PO TBEC: 75.0000 mg | DELAYED_RELEASE_TABLET | Freq: Two times a day (BID) | ORAL | 1 refills | Status: DC | PRN

## 2018-03-27 MED ORDER — BETAMETHASONE VALERATE 0.1 % EX OINT: 1.0000 "application " | TOPICAL_OINTMENT | Freq: Two times a day (BID) | CUTANEOUS | 0 refills | Status: DC

## 2018-03-27 NOTE — Progress Notes (Signed)
Subjective:    Patient ID: Jonathon Graham, male    DOB: [birth year], 62 y.o.   MRN: [id number]  HPI This is a 62 yo male who presents today with several issues/follow up of chronic conditions.   MRI- lower back pain, has been advised to have surgery. Not sure if he wants to have surgery. Has appointment for injection 04/16/18.   OSA- was initially sent full face mask, had problems and is waiting for nasal pillow.   Obesity- continues to watch diet, has lost 25 pounds in last year, working around the house, going to the gym 1 time a week. His mother is living with he and his wife, she is in poor health.   Left shoulder pain, back pain, wrist pain- has used different topical creams. Known osteoarthritis.   Hypoxemia-pulse ox 90s with oxygen, has dropped to 70s without per his home pulse ox.   Rash- bilateral LE with patches of dry, itchy skin  Past Medical History:  Diagnosis Date  . Arthritis   . Benign prostatic hyperplasia (BPH) with urinary urgency    nodular  . COPD (chronic obstructive pulmonary disease) (HCC)   . Depression   . Diabetes mellitus without complication (HCC)   . Essential hypertension   . Fibromyalgia, primary   . Hyperlipidemia   . Hypertension   . Obesity    No past surgical history on file. Family History  Problem Relation Age of Onset  . Heart disease Father   . Diabetes Mellitus II Sister   . Myasthenia gravis Sister   . Fibrocystic breast disease Sister   . Hypertension Sister   . Diabetes Mellitus II Brother    Social History   Tobacco Use  . Smoking status: Former Smoker    Types: Cigarettes    Last attempt to quit: 03/28/2017    Years since quitting: 0.9  . Smokeless tobacco: Never Used  . Tobacco comment: 3-4 daily  Substance Use Topics  . Alcohol use: No    Alcohol/week: 0.0 standard drinks  . Drug use: No      Review of Systems Per HPI    Objective:   Physical Exam  Constitutional: He is oriented to person, place, and  time. He appears well-developed and well-nourished. No distress.  Morbidly obese.   HENT:  Head: Normocephalic and atraumatic.  Eyes: Conjunctivae are normal.  Cardiovascular: Normal rate, regular rhythm and normal heart sounds.  Pulmonary/Chest: Effort normal and breath sounds normal.  Musculoskeletal: He exhibits no edema.  Neurological: He is alert and oriented to person, place, and time.  Skin: Skin is warm and dry. He is not diaphoretic.  Bilateral anterior shins with few scattered patchy, raised area scaling. No erythema or drainage.   Psychiatric: He has a normal mood and affect. His behavior is normal. Judgment and thought content normal.  Vitals reviewed.     BP 100/66 (BP Location: Left Arm, Patient Position: Sitting, Cuff Size: Large)   Pulse 73   Temp (!) 97.3 F (36.3 C) (Oral)   Ht 5\' 8"  (1.727 m)   Wt (!) 311 lb 12 oz (141.4 kg)   SpO2 92%   BMI 47.40 kg/m  Wt Readings from Last 3 Encounters:  03/27/18 (!) 311 lb 12 oz (141.4 kg)  02/14/18 (!) 312 lb (141.5 kg)  01/09/18 (!) 312 lb 8 oz (141.7 kg)       Assessment & Plan:  1. Primary osteoarthritis involving multiple joints - encouraged increased activity, discussed topical pain relief,  heat, occasional use NSAID - diclofenac (VOLTAREN) 75 MG EC tablet; Take 1 tablet (75 mg total) by mouth 2 (two) times daily as needed.  Dispense: 60 tablet; Refill: 1  2. Rash - RTC precautions reviewed - betamethasone valerate ointment (VALISONE) 0.1 %; Apply 1 application topically 2 (two) times daily. For up to 10 days.  Dispense: 30 g; Refill: 0  3. Obesity hypoventilation syndrome (HCC) - has had some weightloss over last year, but struggles with compliance and access- unable to attend pulmonary rehab due to cost, unable to afford diabetic education - seems to be at plateau of weight loss - follow up with pulmonary as scheduled, encouraged him to use CPAP nightly - follow up in 3 months  4. Need for influenza  vaccination - Flu Vaccine QUAD 36+ mos IM   [name], FNP-BC  [facility], [facility]  03/27/2018 8:42 PM

## 2018-03-27 NOTE — Patient Instructions (Signed)
Follow up in 3 months   I have sent in a cream for your rash on your legs  I have sent in an anti-inflammatory for your arthritis- try to move as much as possible, use heat

## 2018-04-03 ENCOUNTER — Telehealth: Payer: Self-pay | Admitting: Family Medicine

## 2018-04-03 NOTE — Telephone Encounter (Signed)
Short term disability benefit and medical report in debbie's in box For review and signature

## 2018-04-03 NOTE — Telephone Encounter (Signed)
Reviewed, signed, dated. Returned to [employer].

## 2018-04-03 NOTE — Telephone Encounter (Signed)
Paperwork faxed °

## 2018-04-04 NOTE — Telephone Encounter (Signed)
Pt aware.

## 2018-04-07 ENCOUNTER — Other Ambulatory Visit: Payer: Self-pay | Admitting: Family Medicine

## 2018-05-02 ENCOUNTER — Telehealth: Payer: Self-pay | Admitting: Family Medicine

## 2018-05-02 NOTE — Telephone Encounter (Signed)
Short term disability paperwork in debbie's in box. Pt aware debbie is out of office today 12/5 and I will be out of office 12/6

## 2018-05-02 NOTE — Telephone Encounter (Signed)
Gave to Jonathon Graham to give to Jonathon Graham

## 2018-05-02 NOTE — Telephone Encounter (Signed)
Pt called requesting a call from robin. He needs his short term disability papers faxed.

## 2018-05-06 NOTE — Telephone Encounter (Signed)
Pt aware °Copy for scan °Copy for pt °

## 2018-05-06 NOTE — Telephone Encounter (Signed)
Signed and returned to Robin.

## 2018-05-06 NOTE — Telephone Encounter (Signed)
Paperwork faxed °

## 2018-05-08 ENCOUNTER — Other Ambulatory Visit: Payer: Self-pay | Admitting: Family Medicine

## 2018-05-08 DIAGNOSIS — E119 Type 2 diabetes mellitus without complications: Secondary | ICD-10-CM

## 2018-05-08 DIAGNOSIS — M5442 Lumbago with sciatica, left side: Secondary | ICD-10-CM

## 2018-05-08 DIAGNOSIS — G8929 Other chronic pain: Secondary | ICD-10-CM

## 2018-05-08 DIAGNOSIS — M5441 Lumbago with sciatica, right side: Secondary | ICD-10-CM

## 2018-06-03 ENCOUNTER — Telehealth: Payer: Self-pay | Admitting: Family Medicine

## 2018-06-03 NOTE — Telephone Encounter (Signed)
Form signed and returned to Robin's desk.

## 2018-06-03 NOTE — Telephone Encounter (Signed)
Short term disability benefits and medial report in debbie's in box for review and signature  Pt stated this would be the last form he will need to have filled out

## 2018-06-04 NOTE — Telephone Encounter (Signed)
Paperwork faxed °

## 2018-06-05 NOTE — Telephone Encounter (Signed)
Pt aware  °Copy for pt °Copy for scan °

## 2018-06-21 ENCOUNTER — Other Ambulatory Visit: Payer: Self-pay | Admitting: Family Medicine

## 2018-06-21 DIAGNOSIS — N401 Enlarged prostate with lower urinary tract symptoms: Secondary | ICD-10-CM

## 2018-06-21 DIAGNOSIS — R351 Nocturia: Principal | ICD-10-CM

## 2018-06-21 DIAGNOSIS — E1142 Type 2 diabetes mellitus with diabetic polyneuropathy: Secondary | ICD-10-CM

## 2018-06-24 NOTE — Telephone Encounter (Signed)
[facility] Night - Client TELEPHONE ADVICE RECORD [facility] Patient Name: Jonathon Graham Gender: Male DOB: [birth year] Age: 63 Y 1 M 7 D Return Phone Number: [phone] (Primary), [phone] (Secondary) Address: City/State/Zip: [place] [address] Client [facility] Night - Client Client Site [facility] - Night Physician Deboraha Sprang- NP Contact Type Call Who Is Calling Patient / Member / Family / Caregiver Call Type Triage / Clinical Relationship To Patient Self Return Phone Number [phone] (Primary) Chief Complaint Prescription Refill or Medication Request (non symptomatic) Reason for Call Medication Question / Request Initial Comment Caller states he needs a rx refill gabapentin and tamsulosin Translation No Nurse Assessment Guidelines Guideline Title Affirmed Question Affirmed Notes Nurse Date/Time (Eastern Time) Disp. Time Lamount Cohen Time) Disposition Final User 06/21/2018 5:48:08 PM Attempt made - message left Micheline Rough, RN, [facility] 06/21/2018 6:00:12 PM FINAL ATTEMPT MADE - message left Yes Micheline Rough, RN, [facility] Robes

## 2018-06-24 NOTE — Telephone Encounter (Signed)
Electronic refill request Gabapentin Last office visit 03/27/18 Last refill 10/03/17 #270/1 refill Patient dure for a 3 month follow-up, called patient and scheduled appointment for 07/12/18

## 2018-07-12 ENCOUNTER — Ambulatory Visit (INDEPENDENT_AMBULATORY_CARE_PROVIDER_SITE_OTHER): Payer: BC Managed Care – PPO | Admitting: Family Medicine

## 2018-07-12 ENCOUNTER — Encounter: Payer: Self-pay | Admitting: Family Medicine

## 2018-07-12 ENCOUNTER — Other Ambulatory Visit: Payer: Self-pay

## 2018-07-12 VITALS — BP 106/72 | HR 68 | Temp 97.6°F | Ht 68.0 in | Wt 277.0 lb

## 2018-07-12 DIAGNOSIS — M47816 Spondylosis without myelopathy or radiculopathy, lumbar region: Secondary | ICD-10-CM

## 2018-07-12 DIAGNOSIS — M5441 Lumbago with sciatica, right side: Secondary | ICD-10-CM

## 2018-07-12 DIAGNOSIS — E1142 Type 2 diabetes mellitus with diabetic polyneuropathy: Secondary | ICD-10-CM | POA: Diagnosis not present

## 2018-07-12 DIAGNOSIS — G8929 Other chronic pain: Secondary | ICD-10-CM

## 2018-07-12 DIAGNOSIS — F322 Major depressive disorder, single episode, severe without psychotic features: Secondary | ICD-10-CM | POA: Diagnosis not present

## 2018-07-12 DIAGNOSIS — M797 Fibromyalgia: Secondary | ICD-10-CM

## 2018-07-12 DIAGNOSIS — M5442 Lumbago with sciatica, left side: Secondary | ICD-10-CM | POA: Diagnosis not present

## 2018-07-12 DIAGNOSIS — M48061 Spinal stenosis, lumbar region without neurogenic claudication: Secondary | ICD-10-CM

## 2018-07-12 LAB — BASIC METABOLIC PANEL
BUN: 15 mg/dL (ref 6–23)
CO2: 33 mEq/L — ABNORMAL HIGH (ref 19–32)
Calcium: 9.5 mg/dL (ref 8.4–10.5)
Chloride: 98 mEq/L (ref 96–112)
Creatinine, Ser: 0.78 mg/dL (ref 0.40–1.50)
GFR: 122.78 mL/min (ref >60.00)
Glucose, Bld: 147 mg/dL — ABNORMAL HIGH (ref 70–99)
Potassium: 3.4 mEq/L — ABNORMAL LOW (ref 3.5–5.1)
Sodium: 140 mEq/L (ref 135–145)

## 2018-07-12 LAB — HEMOGLOBIN A1C: Hgb A1c MFr Bld: 6.7 % — ABNORMAL HIGH (ref 4.6–6.5)

## 2018-07-12 MED ORDER — DULOXETINE HCL 30 MG PO CPEP: 30.0000 mg | ORAL_CAPSULE | Freq: Every day | ORAL | 2 refills | Status: DC

## 2018-07-12 NOTE — Progress Notes (Signed)
Subjective:    Patient ID: Jonathon Graham, male    DOB: 15-Jan-1956, 63 y.o.   MRN: [id number]  HPI This is a 63 yo male patient being seen today for follow up of chronic conditions and grief from loss of sister in April 2019.  He states that his legs hurts and can't sit longer than 15-20 minutes at a time and very hard from him to stand. The pain in his hands seems to be getting worse than in the past. He states he can go without the use of his oxygen for 1-2 hours if sitting but for walking he needs right away. He uses 2L while away from home (concerned about tank running out), but increases to 4L at home. SOB with talking, lightheaded & dizzy with exertion.   No longer exercising because he has no desire. Persistent depression with the loss of sister from April 2019.   Past Medical History:  Diagnosis Date  . Arthritis   . Benign prostatic hyperplasia (BPH) with urinary urgency    nodular  . COPD (chronic obstructive pulmonary disease) (HCC)   . Depression   . Diabetes mellitus without complication (HCC)   . Essential hypertension   . Fibromyalgia, primary   . Hyperlipidemia   . Hypertension   . Obesity    No past surgical history on file. Family History  Problem Relation Age of Onset  . Heart disease Father   . Diabetes Mellitus II Sister   . Myasthenia gravis Sister   . Fibrocystic breast disease Sister   . Hypertension Sister   . Diabetes Mellitus II Brother    Social History   Tobacco Use  . Smoking status: Former Smoker    Types: Cigarettes    Last attempt to quit: 03/28/2017    Years since quitting: 1.2  . Smokeless tobacco: Never Used  . Tobacco comment: 3-4 daily  Substance Use Topics  . Alcohol use: No    Alcohol/week: 0.0 standard drinks  . Drug use: No   Review of Systems  Constitutional: Negative.   Respiratory: Negative.   Musculoskeletal: Positive for arthralgias and myalgias.  Psychiatric/Behavioral: Positive for sleep disturbance. Negative for  suicidal ideas.       On-going depressed symptoms from loss of sister in April 2019   Per HPI    Objective:   Physical Exam Vitals signs reviewed.  Constitutional:      Appearance: He is obese.  HENT:     Head: Normocephalic.  Pulmonary:     Breath sounds: Examination of the right-lower field reveals decreased breath sounds. Examination of the left-lower field reveals decreased breath sounds. Decreased breath sounds present.     Comments: On oxygen at 2L/min Neurological:     Mental Status: He is alert and oriented to person, place, and time.  Psychiatric:        Attention and Perception: Attention normal.        Mood and Affect: Mood is depressed.        Behavior: Behavior normal. Behavior is cooperative.        Thought Content: Thought content normal. Thought content does not include suicidal ideation.     Comments: Depressive symptoms (anhedonia, insomnia,lack of appetite, weight loss).    BP 106/72 (BP Location: Left Arm, Patient Position: Sitting, Cuff Size: Large)   Pulse 68   Temp 97.6 F (36.4 C) (Oral)   Ht 5\' 8"  (1.727 m)   Wt 277 lb (125.6 kg)   SpO2 93% Comment: on  2L  BMI 42.12 kg/m  Wt Readings from Last 3 Encounters:  07/12/18 277 lb (125.6 kg)  03/27/18 (!) 311 lb 12 oz (141.4 kg)  02/14/18 (!) 312 lb (141.5 kg)   Filed Weights   07/12/18 0912  Weight: 277 lb (125.6 kg)       Assessment & Plan:  Depression, major, single episode, severe (HCC) - Plan: DULoxetine (CYMBALTA) 30 MG capsule  Fibromyalgia - Plan: DULoxetine (CYMBALTA) 30 MG capsule  Type 2 diabetes, controlled, with peripheral neuropathy (HCC) - Plan: Hemoglobin A1c, Basic Metabolic Panel  Patient Instructions  Good to see you today  I have sent in medication to help with your mood and fibromyalgia- duloxetine  I will send in a referral for a second opinion for a spine doctor  Call and schedule an appointment with a therapist  Follow up with me in 2 months

## 2018-07-12 NOTE — Progress Notes (Signed)
[facility] Consultation      Assessment and Plan:  63 yo male with sleep apnea, morbid obesity, dyspnea, possible obesity hypoventilation.   Chronic bronchitis, group B. --With exacerbation requiring hospitalization.  --Not taking advair regularly, only about once per day. Reminded that he should be taking it twice per day.   Tobacco abuse. --He has stopped smoking and is encouraged not to restart.   OSA/obesity hypoventilation syndrome. -Currently has VPAP auto device max IPAP 25, minimum EPAP 16, pressure support 5. -Currently not using due to mask issues, he is [facility].  Recommend continue using BiPAP nightly.  Insomnia with excessive daytime sleepiness, possible shift workers disorder. -He has multiple etiologies for his insomnia and daytime sleepiness, which may include peripheral neuropathy, poor sleep hygiene, anxiety. He may also have some degree of circadian rhythm disorder given his early rising time at 2:30 AM. -Advised him not to take naps during the day, we discussed improvement of sleep hygiene.   Obesity. -Severe morbid obesity with BMI of 51, this may be contributing to obesity hypoventilation syndrome and elevated CO2 levels. --Continue using PAP every night.   Dyspnea with restrictive lung disease, likely due to obesity. -Patient is currently on advair but using less since losing weight.  -His dyspnea may be multifactorial from asthma, as well as severe morbid obesity. Discussed the importance of weight loss going forwards.   Return in about 6 months (around 01/13/2019).  Date: 07/12/2018  MRN# [id number] Jonathon Graham 09-27-1955    Jonathon Graham is a 63 y.o. old male seen in consultation for chief complaint of:    Chief Complaint  Patient presents with  . Follow-up    pt states he has stopped wearing cpap one month ago, due to mask being broke. pt reports of sob with exertion, occ wheezing & occ  chest tightness. on 3L O2.    HPI:   The patient is a 63yo male with a history of OSA, severe daytime sleepiness-- he could not afford CPAP therefore it was taken away.  He was sent for a new sleep study to requalify for CPAP, and a new CPAP was ordered.  He had started at CPAP but had mask issues and it help leaking, therefore asked [facility] for a new mask and is currently waiting for a new one.  He was also noted to have insomnia and on several medications which could be causing daytime sleepiness. He has been using advair once daily, albuterol at night.  He has stopped smoking, he is not vaping.   **VPAP auto compliance, usage greater than 4 hours is 0/90 days.  Current settings max IPAP 25, minimum EPAP 16, pressure support 5. **Pulmonary function test 12/20/2017>> FVC 65% predicted, FEV1 63% predicted.  There is no improvement with bronchodilator therapy.  Flow volume loop appears unremarkable.  TLC 78% predicted.  Ratio was normal.  Diffusion capacity is moderately reduced at 54%. -Overall this test shows moderate restrictive lung disease. **ABG 02/01/17>>7.39/53/52/31-- chronic hypercapnic and hypoxic respiratory failure.  **CT chest 11/19/2016>> appears lung volume secondary to body habitus with bibasilar atelectasis, otherwise lungs are unremarkable.  Review of outside sleep study 04/17/13; split-night AHI equals 101.4 consistent with very severe sleep apnea, no central apnea noted. Patient was started on CPAP, titrated to a pressure of 16. At this pressure lowest sat was 83%. Patient slept for 51 minutes. REM supine of 9.4 minutes. AHI was 6.4. At CPAP of 16, 8.1. A  CPAP of 15. The patient was recommended to use CPAP at 16. Overall this test is insistent with very severe. Obstructive sleep apnea, sleep-related hypoxemia, CPAP was started at 16.  **Blood gas 02/01/17>> 7.39/53/52/32.1. Consistent with chronic hypercapnic and hypoxic respiratory failure.  Medication:    Current Outpatient  Medications:  .  acetaminophen (TYLENOL) 500 MG tablet, Take 1 tablet (500 mg total) by mouth every 8 (eight) hours as needed., Disp: 30 tablet, Rfl: 0 .  albuterol (PROAIR HFA) 108 (90 Base) MCG/ACT inhaler, Inhale 1-2 puffs into the lungs every 4 (four) hours as needed for wheezing or shortness of breath., Disp: 1 Inhaler, Rfl: 5 .  albuterol (PROVENTIL) (2.5 MG/3ML) 0.083% nebulizer solution, Take 3 mLs (2.5 mg total) by nebulization every 6 (six) hours as needed for wheezing or shortness of breath., Disp: 150 mL, Rfl: 6 .  aspirin 81 MG tablet, Take 243 mg by mouth daily. Takes 4 15m aspirin daily., Disp: , Rfl:  .  Aspirin-Calcium Carbonate (BAYER WOMENS) 8713-001-5758MG TABS, Take by mouth., Disp: , Rfl:  .  atorvastatin (LIPITOR) 20 MG tablet, TAKE 1 TABLET BY MOUTH ONCE DAILY, Disp: 30 tablet, Rfl: 11 .  betamethasone valerate ointment (VALISONE) 0.1 %, Apply 1 application topically 2 (two) times daily. For up to 10 days., Disp: 30 g, Rfl: 0 .  blood glucose meter kit and supplies KIT, Dispense based on patient and insurance preference. Use up to four times daily as directed. (FOR ICD-9 250.00, 250.01)., Disp: 1 each, Rfl: 0 .  clotrimazole (LOTRIMIN) 1 % cream, Apply 1 application 2 (two) times daily topically. For 7 days, Disp: 28 g, Rfl: 0 .  diclofenac (VOLTAREN) 75 MG EC tablet, Take 1 tablet (75 mg total) by mouth 2 (two) times daily as needed., Disp: 60 tablet, Rfl: 1 .  DULoxetine (CYMBALTA) 30 MG capsule, Take 1 capsule (30 mg total) by mouth daily., Disp: 30 capsule, Rfl: 2 .  Elastic Bandages & Supports (MEDICAL COMPRESSION SOCKS) MISC, 1 each by Does not apply route daily., Disp: 1 each, Rfl: 0 .  Fluticasone-Salmeterol (ADVAIR DISKUS) 250-50 MCG/DOSE AEPB, Inhale 1 puff into the lungs 2 (two) times daily., Disp: 60 each, Rfl: 3 .  furosemide (LASIX) 40 MG tablet, TAKE 1 TABLET BY MOUTH ONCE DAILY, Disp: 90 tablet, Rfl: 1 .  gabapentin (NEURONTIN) 300 MG capsule, TAKE 1 CAPSULE BY MOUTH  THREE TIMES DAILY, Disp: 270 capsule, Rfl: 0 .  hydrochlorothiazide (MICROZIDE) 12.5 MG capsule, TAKE 1 CAPSULE BY MOUTH ONCE DAILY, Disp: 90 capsule, Rfl: 1 .  ibuprofen (ADVIL,MOTRIN) 800 MG tablet, TAKE 1 TABLET BY MOUTH EVERY 8 HOURS AS NEEDED, Disp: 60 tablet, Rfl: 1 .  metFORMIN (GLUCOPHAGE) 1000 MG tablet, TAKE 1 TABLET BY MOUTH TWICE DAILY WITH A MEAL (PATIENT  NEEDS  TO  CALL  FOR  DIABETIC  FOLLOW UP APPOINTMENT), Disp: 180 tablet, Rfl: 0 .  oxyCODONE-acetaminophen (PERCOCET/ROXICET) 5-325 MG tablet, Take 1 tablet by mouth 2 (two) times daily as needed for severe pain., Disp: 10 tablet, Rfl: 0 .  tamsulosin (FLOMAX) 0.4 MG CAPS capsule, TAKE 1 CAPSULE BY MOUTH ONCE DAILY, Disp: 90 capsule, Rfl: 0  Current Facility-Administered Medications:  .  ipratropium-albuterol (DUONEB) 0.5-2.5 (3) MG/3ML nebulizer solution 3 mL, 3 mL, Nebulization, Q6H, Krebs, Amy Lauren, NP   Allergies:  Penicillins  Review of Systems:  Constitutional: Feels well. Cardiovascular: Denies chest pain, exertional chest pain.  Pulmonary: Denies hemoptysis, pleuritic chest pain.   The remainder of systems were  reviewed and were found to be negative other than what is documented in the HPI.    Physical Examination:   VS: BP 110/66 (BP Location: Left Arm, Cuff Size: Large)   Pulse 93   Ht '5\' 8"'  (1.727 m)   Wt (!) 311 lb 12.8 oz (141.4 kg)   SpO2 94%   BMI 47.41 kg/m   General Appearance: No distress, obesity.  Neuro:without focal findings, mental status, speech normal, alert and oriented HEENT: PERRLA, EOM intact Pulmonary: No wheezing, No rales  CardiovascularNormal S1,S2.  No m/r/g.  Abdomen: Benign, Soft, non-tender, No masses Renal:  No costovertebral tenderness  GU:  No performed at this time. Endoc: No evident thyromegaly, no signs of acromegaly or Cushing features Skin:   warm, no rashes, no ecchymosis  Extremities: normal, no cyanosis, clubbing.      LABORATORY PANEL:   CBC No results  for input(s): WBC, HGB, HCT, PLT in the last 168 hours. ------------------------------------------------------------------------------------------------------------------  Chemistries  No results for input(s): NA, K, CL, CO2, GLUCOSE, BUN, CREATININE, CALCIUM, MG, AST, ALT, ALKPHOS, BILITOT in the last 168 hours.  Invalid input(s): GFRCGP ------------------------------------------------------------------------------------------------------------------  Cardiac Enzymes No results for input(s): TROPONINI in the last 168 hours. ------------------------------------------------------------  RADIOLOGY:  No results found.     Thank  you for the consultation and for allowing [facility] to assist in the care of your patient. Our recommendations are noted above.  Please contact us if we can be of further service.   Marda Stalker, M.D., F.C.C.P.  Board Certified in Internal Medicine, Pulmonary Medicine, [place], and Sleep Medicine.  [facility] Office Number: [phone]   07/12/2018

## 2018-07-12 NOTE — Progress Notes (Signed)
Subjective:    Patient ID: Jonathon Graham, male    DOB: 1956-03-28, 63 y.o.   MRN: [id number]  HPI This is a 64 yo male who presents today for 3 month follow up of chronic medical conditions.   Grief/depression- lost his sister in the fall.  His mother has moved in with he and his wife.  He has had significantly decreased appetite and difficulty forcing himself to get out of the house and do things he needs to do.  He feels very guilty about his sister's death.  He is interested in in counseling.  He did attend short-term grief counseling following his sister's passing and found this to be helpful.  COPD/hypoxeia- using 2-4 liters oxygen.  His disability claim was denied and he has retained an attorney.  OSA-he has follow-up with pulmonary this week.  Back pain-he was seen orthopedics and had an MRI of his lumbar spine in November.  It showed moderate stenosis at L4-5 with a 4 mm slip, advanced posterior element hypertrophy and uncovering of the disc.  He was offered injections but declined these as they did not find them helpful in the past.  He is requesting a second opinion to see if there is anything else that can be done.  He continues to do a little bit of walking but has not been exercising much recently with his poor mood.  Past Medical History:  Diagnosis Date  . Arthritis   . Benign prostatic hyperplasia (BPH) with urinary urgency    nodular  . COPD (chronic obstructive pulmonary disease) (HCC)   . Depression   . Diabetes mellitus without complication (HCC)   . Essential hypertension   . Fibromyalgia, primary   . Hyperlipidemia   . Hypertension   . Obesity   No past surgical history on file. Family History  Problem Relation Age of Onset  . Heart disease Father   . Diabetes Mellitus II Sister   . Myasthenia gravis Sister   . Fibrocystic breast disease Sister   . Hypertension Sister   . Diabetes Mellitus II Brother    Social History   Tobacco Use  . Smoking status:  Former Smoker    Types: Cigarettes    Last attempt to quit: 03/28/2017    Years since quitting: 1.2  . Smokeless tobacco: Never Used  . Tobacco comment: 3-4 daily  Substance Use Topics  . Alcohol use: No    Alcohol/week: 0.0 standard drinks  . Drug use: No      Review of Systems Per HPI    Objective:   Physical Exam Vitals signs reviewed.  Constitutional:      General: He is not in acute distress.    Appearance: He is obese. He is not ill-appearing, toxic-appearing or diaphoretic.  HENT:     Head: Normocephalic and atraumatic.  Cardiovascular:     Rate and Rhythm: Normal rate and regular rhythm.     Heart sounds: Normal heart sounds.  Pulmonary:     Effort: Pulmonary effort is normal.     Breath sounds: Normal breath sounds.     Comments: On 2L O2 via nasal cannula.  Skin:    General: Skin is warm and dry.  Neurological:     Mental Status: He is alert and oriented to person, place, and time.  Psychiatric:        Mood and Affect: Mood normal.        Behavior: Behavior normal.        Thought  Content: Thought content normal.        Judgment: Judgment normal.       BP 106/72 (BP Location: Left Arm, Patient Position: Sitting, Cuff Size: Large)   Pulse 68   Temp 97.6 F (36.4 C) (Oral)   Ht 5\' 8"  (1.727 m)   Wt 277 lb (125.6 kg)   SpO2 93% Comment: on 2L  BMI 42.12 kg/m  Wt Readings from Last 3 Encounters:  07/12/18 277 lb (125.6 kg)  03/27/18 (!) 311 lb 12 oz (141.4 kg)  02/14/18 (!) 312 lb (141.5 kg)           Depression screen [facility] 2/9 07/12/2018 03/14/2016 11/26/2015 06/15/2015 05/26/2015  Decreased Interest 3 0 0 0 0  Down, Depressed, Hopeless 3 1 0 0 0  PHQ - 2 Score 6 1 0 0 0  Altered sleeping 3 - - - -  Tired, decreased energy 3 - - - -  Change in appetite 3 - - - -  Feeling bad or failure about yourself  3 - - - -  Trouble concentrating 2 - - - -  Moving slowly or fidgety/restless 2 - - - -  Suicidal thoughts 0 - - - -  PHQ-9 Score 22 - - - -    Difficult doing work/chores Somewhat difficult - - - -    Assessment & Plan:  1. Depression, major, single episode, severe (HCC) - provided him contact info to schedule counseling appointment - DULoxetine (CYMBALTA) 30 MG capsule; Take 1 capsule (30 mg total) by mouth daily.  Dispense: 30 capsule; Refill: 2 - follow up in 6 weeks  2. Fibromyalgia - DULoxetine (CYMBALTA) 30 MG capsule; Take 1 capsule (30 mg total) by mouth daily.  Dispense: 30 capsule; Refill: 2  3. Type 2 diabetes, controlled, with peripheral neuropathy (HCC) - Hemoglobin A1c - Basic Metabolic Panel  4. Chronic bilateral low back pain with bilateral sciatica - Ambulatory referral to Spine Surgery  5. Spinal stenosis of lumbar region, unspecified whether neurogenic claudication present - Ambulatory referral to Spine Surgery  6. Facet hypertrophy of lumbar region - Ambulatory referral to Spine Surgery   [name], FNP-BC   [facility], [facility]  07/15/2018 8:26 AM

## 2018-07-12 NOTE — Patient Instructions (Signed)
Good to see you today  I have sent in medication to help with your mood and fibromyalgia- duloxetine  I will send in a referral for a second opinion for a spine doctor  Call and schedule an appointment with a therapist  Follow up with me in 2 months

## 2018-07-15 ENCOUNTER — Encounter: Payer: Self-pay | Admitting: Family Medicine

## 2018-07-15 ENCOUNTER — Encounter: Payer: Self-pay | Admitting: Internal Medicine

## 2018-07-15 ENCOUNTER — Ambulatory Visit (INDEPENDENT_AMBULATORY_CARE_PROVIDER_SITE_OTHER): Payer: BC Managed Care – PPO | Admitting: Internal Medicine

## 2018-07-15 VITALS — BP 110/66 | HR 93 | Ht 68.0 in | Wt 311.8 lb

## 2018-07-15 DIAGNOSIS — E662 Morbid (severe) obesity with alveolar hypoventilation: Secondary | ICD-10-CM | POA: Diagnosis not present

## 2018-07-15 DIAGNOSIS — J4540 Moderate persistent asthma, uncomplicated: Secondary | ICD-10-CM | POA: Diagnosis not present

## 2018-07-15 DIAGNOSIS — G4733 Obstructive sleep apnea (adult) (pediatric): Secondary | ICD-10-CM

## 2018-07-15 NOTE — Patient Instructions (Signed)
Take advair twice per day and rinse mouth after use.  Weight loss will help with your breathing.

## 2018-08-11 ENCOUNTER — Other Ambulatory Visit: Payer: Self-pay | Admitting: Family Medicine

## 2018-08-11 DIAGNOSIS — E119 Type 2 diabetes mellitus without complications: Secondary | ICD-10-CM

## 2018-08-11 DIAGNOSIS — I10 Essential (primary) hypertension: Secondary | ICD-10-CM

## 2018-08-13 ENCOUNTER — Other Ambulatory Visit: Payer: Self-pay

## 2018-08-13 ENCOUNTER — Ambulatory Visit (INDEPENDENT_AMBULATORY_CARE_PROVIDER_SITE_OTHER): Payer: Medicaid Other | Admitting: Psychology

## 2018-08-13 DIAGNOSIS — F3289 Other specified depressive episodes: Secondary | ICD-10-CM

## 2018-08-23 ENCOUNTER — Ambulatory Visit (INDEPENDENT_AMBULATORY_CARE_PROVIDER_SITE_OTHER): Payer: Medicaid Other | Admitting: Family Medicine

## 2018-08-23 ENCOUNTER — Other Ambulatory Visit: Payer: Self-pay

## 2018-08-23 ENCOUNTER — Encounter: Payer: Self-pay | Admitting: Family Medicine

## 2018-08-23 DIAGNOSIS — E662 Morbid (severe) obesity with alveolar hypoventilation: Secondary | ICD-10-CM | POA: Diagnosis not present

## 2018-08-23 DIAGNOSIS — F322 Major depressive disorder, single episode, severe without psychotic features: Secondary | ICD-10-CM

## 2018-08-23 DIAGNOSIS — E1142 Type 2 diabetes mellitus with diabetic polyneuropathy: Secondary | ICD-10-CM

## 2018-08-23 NOTE — Progress Notes (Signed)
Virtual Visit via Video Note  I connected with Jonathon Graham on 08/23/18 at 10:30 AM EDT by a video enabled telemedicine application and verified that I am speaking with the correct person using two identifiers.  The patient was in his personal vehicle, his wife [name] was present.  I was in my office.   I discussed the limitations of evaluation and management by telemedicine and the availability of in person appointments. The patient expressed understanding and agreed to proceed.  History of Present Illness: This is a 63 yo male who is following up for several conditions.  Depression- he was started on duloxetine 30 mg po last month. He has also been attending counseling with Lennice Sites which he has found helpful in dealing with his sister's recent, unexpected death. He reports that he had some side effects initially with the duloxetine- night mares, stomach feeling uneasy, but these have resolved.  He reports he has a lot to work through including son older issues.  OSA/ obesity hypoventilation syndrome/oxygen dependence- he had recent visit with pulmonary, was encouraged to use his Advair as prescribed twice a day which he reports that he is doing. He is currently using oxygen 2 L 85% of the time. He reports DOE with little activity. He has been trying to be as active as possible in his yard since his gym is closed.  He has been denied disability and is working with an Pensions consultant for an appeal.   Observations/Objective: Patient was alert and oriented, he was normally conversive and did not appear short of breath with regular speech.  Assessment and Plan: 1. Depression, major, single episode, severe (HCC) -Continue current dose of duloxetine, encouraged him to continue therapy -Follow-up in 3 months sooner if worsening symptoms  2. Obesity hypoventilation syndrome (HCC) -Continue home O2 at 2 L per nasal cannula, pulmonary follow-up as scheduled -Encouraged him to avoid being around other  people and going out in public during COVID-19 precautionary.  3. Type 2 diabetes, controlled, with peripheral neuropathy (HCC) -Hemoglobin A1c last month was 6.7, encouraged him to continue to make healthy food choices and exercise at home as able until his gym reopens  -Follow-up in 3 months, sooner if needed   [name], FNP-BC  [facility], [facility]  08/23/2018 4:47 PM   Follow Up Instructions:    I discussed the assessment and treatment plan with the patient. The patient was provided an opportunity to ask questions and all were answered. The patient agreed with the plan and demonstrated an understanding of the instructions.   The patient was advised to call back or seek an in-person evaluation if the symptoms worsen or if the condition fails to improve as anticipated.  I provided 18 minutes of non-face-to-face time during this encounter.   [name], FNP

## 2018-08-23 NOTE — Progress Notes (Deleted)
Virtual Visit via Telephone Note  I connected with Jonathon Graham on 08/23/18 at 10:30 AM EDT by telephone and verified that I am speaking with the correct person using two identifiers.   I discussed the limitations, risks, security and privacy concerns of performing an evaluation and management service by telephone and the availability of in person appointments. I also discussed with the patient that there may be a patient responsible charge related to this service. The patient expressed understanding and agreed to proceed.   History of Present Illness: This is a 63 yo male who is following up from recent diagnosis of major depression. He was sseen 07/12/2018. And started on   sertraline  Observations/Objective:   Assessment and Plan:   Follow Up Instructions:    I discussed the assessment and treatment plan with the patient. The patient was provided an opportunity to ask questions and all were answered. The patient agreed with the plan and demonstrated an understanding of the instructions.   The patient was advised to call back or seek an in-person evaluation if the symptoms worsen or if the condition fails to improve as anticipated.  I provided *** minutes of non-face-to-face time during this encounter.   [name], FNP

## 2018-09-05 ENCOUNTER — Ambulatory Visit: Payer: Medicaid Other | Admitting: Psychology

## 2018-09-06 ENCOUNTER — Other Ambulatory Visit: Payer: Self-pay | Admitting: Family Medicine

## 2018-09-06 DIAGNOSIS — M5442 Lumbago with sciatica, left side: Principal | ICD-10-CM

## 2018-09-06 DIAGNOSIS — G8929 Other chronic pain: Secondary | ICD-10-CM

## 2018-09-06 DIAGNOSIS — M5441 Lumbago with sciatica, right side: Principal | ICD-10-CM

## 2018-09-09 ENCOUNTER — Ambulatory Visit: Payer: Medicaid Other | Admitting: Family Medicine

## 2018-09-17 ENCOUNTER — Other Ambulatory Visit: Payer: Self-pay | Admitting: Family Medicine

## 2018-09-17 ENCOUNTER — Ambulatory Visit: Payer: Medicaid Other | Admitting: Psychology

## 2018-09-17 DIAGNOSIS — R351 Nocturia: Principal | ICD-10-CM

## 2018-09-17 DIAGNOSIS — N401 Enlarged prostate with lower urinary tract symptoms: Secondary | ICD-10-CM

## 2018-09-23 ENCOUNTER — Ambulatory Visit: Payer: Medicaid Other | Admitting: Psychology

## 2018-09-25 IMAGING — CT CT ANGIO CHEST
2 of 7 series · 18 of 46 positions shown · IV contrast (isovue)
Comparison: Chest radiograph dated 03/24/2017

ADDENDUM:
Mild dilatation of the ascending aorta measuring up to 4.5 cm.
Ascending thoracic aortic aneurysm. Recommend semi-annual imaging
followup by CTA or MRA and referral to cardiothoracic surgery if not
already obtained. This recommendation follows 9383
ACCF/AHA/AATS/ACR/ASA/SCA/CRISANTO/PASMAN/AMAAH/FLAWERS Guidelines for the
Diagnosis and Management of Patients With Thoracic Aortic Disease.
Circulation. 9383; 121: [id number]
CLINICAL DATA: 60-year-old male with shortness of breath.

EXAM:
CT ANGIOGRAPHY CHEST WITH CONTRAST
TECHNIQUE: Multidetector CT imaging of the chest was performed using the
standard protocol during bolus administration of intravenous
contrast. Multiplanar CT image reconstructions and MIPs were
obtained to evaluate the vascular anatomy.
CONTRAST:  100 cc Isovue 370

[Series 6: thins · axial · 0.77mm/px · z∈[-598,-346]mm · 16 of 282 slices shown]
[im 15/282  lung]
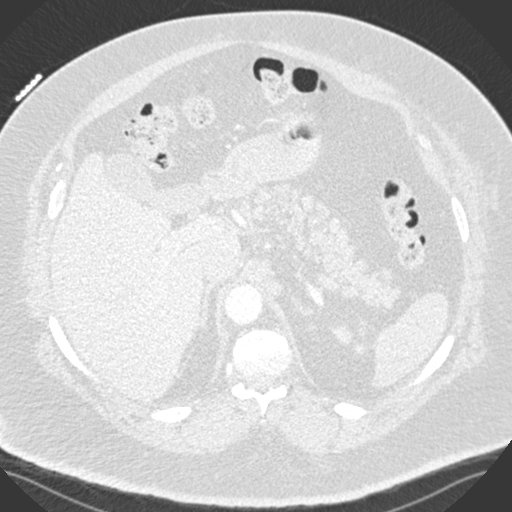
[im 30/282  soft-tissue]
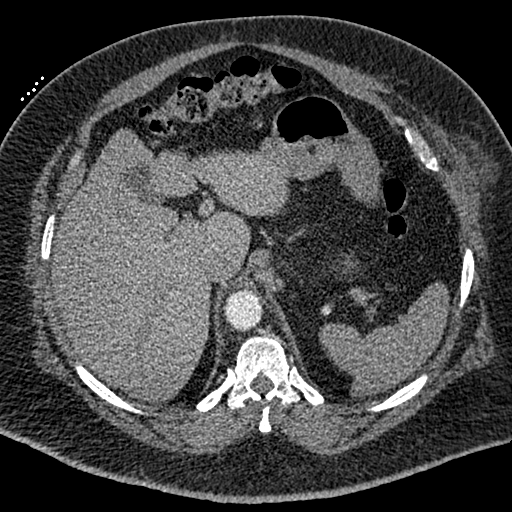
[im 45/282  lung]
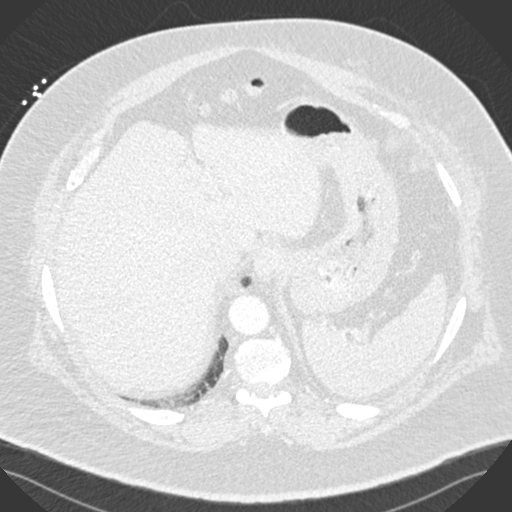
[im 60/282  soft-tissue]
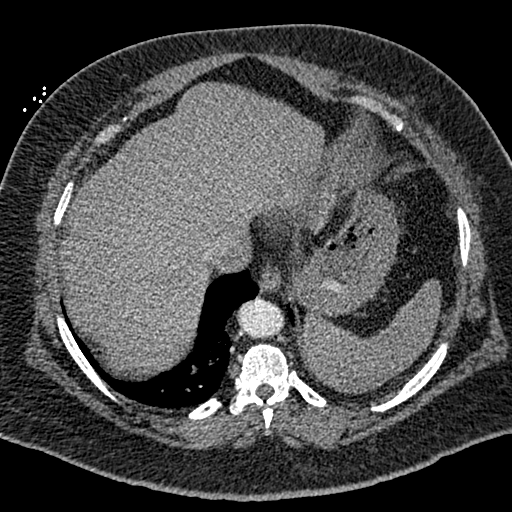
[im 89/282  lung]
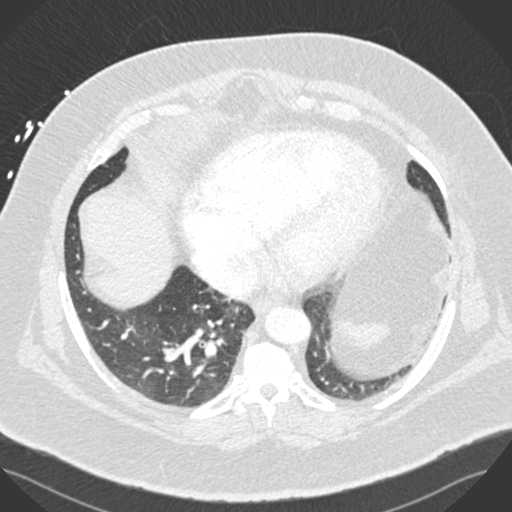
[im 104/282  soft-tissue]
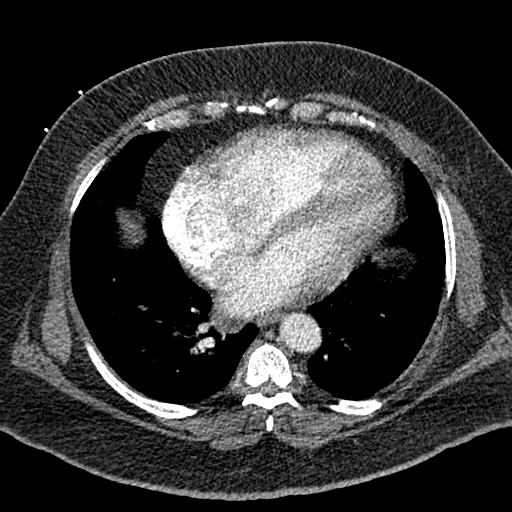
[im 119/282  lung]
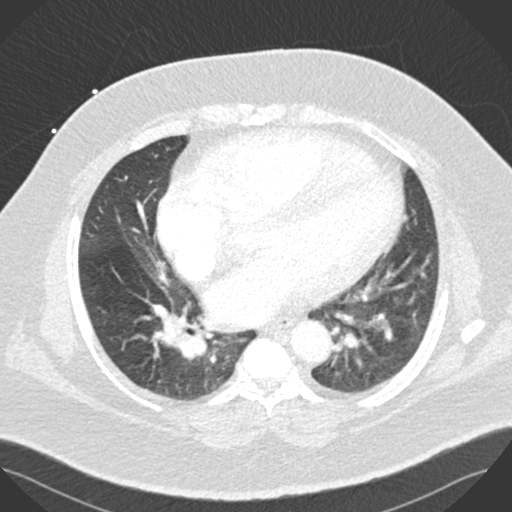
[im 134/282  soft-tissue]
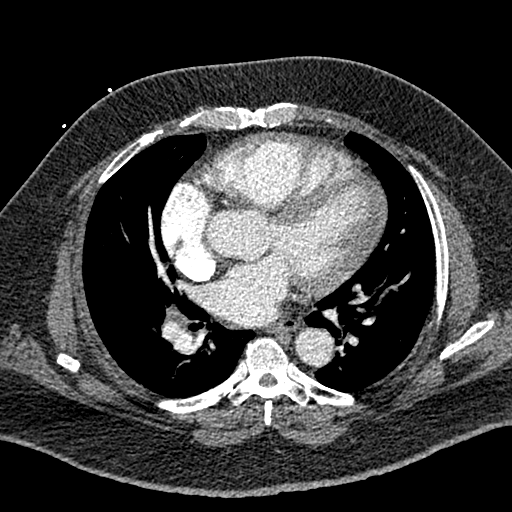
[im 148/282  lung]
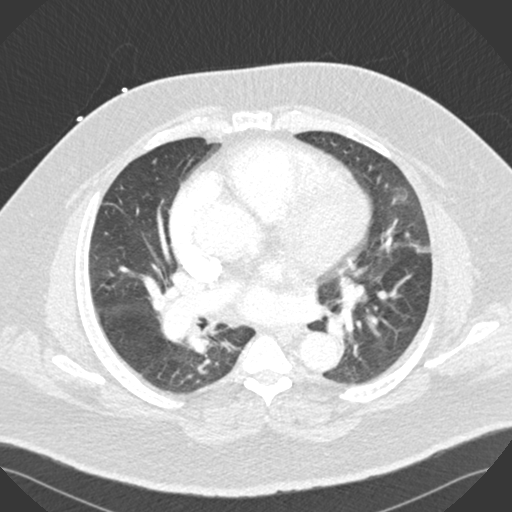
[im 163/282  soft-tissue]
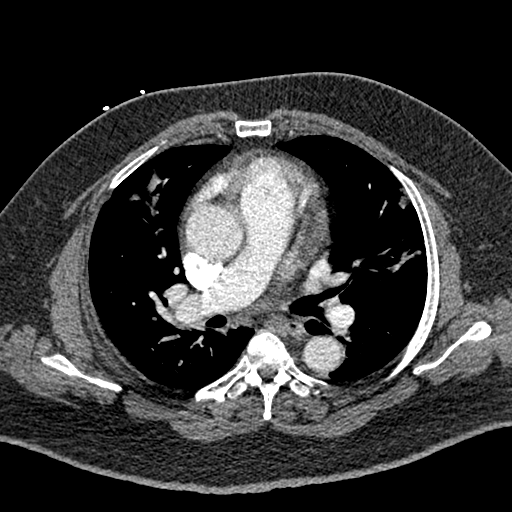
[im 178/282  lung]
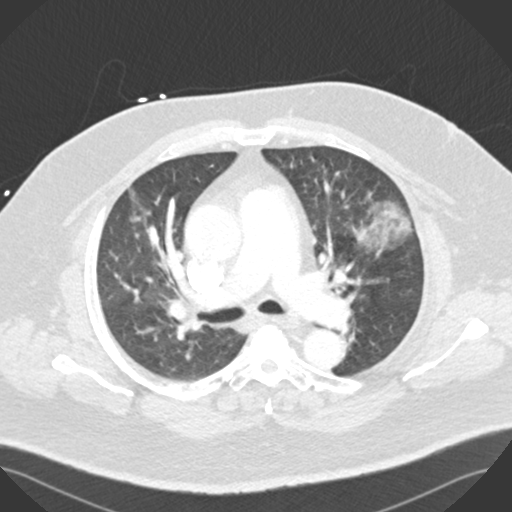
[im 193/282  soft-tissue]
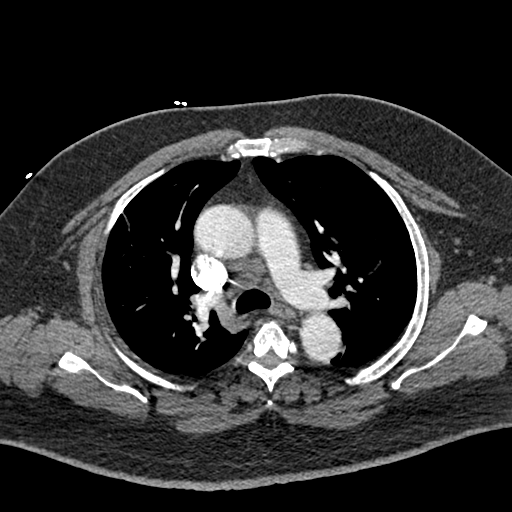
[im 222/282  lung]
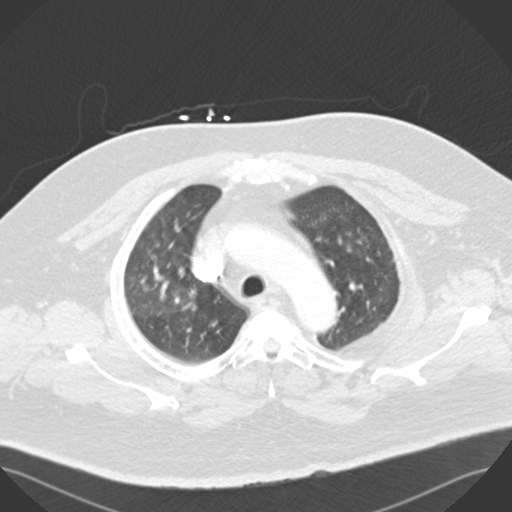
[im 237/282  soft-tissue]
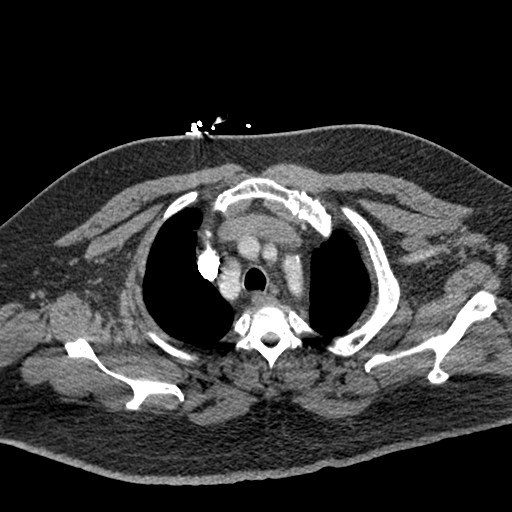
[im 252/282  lung]
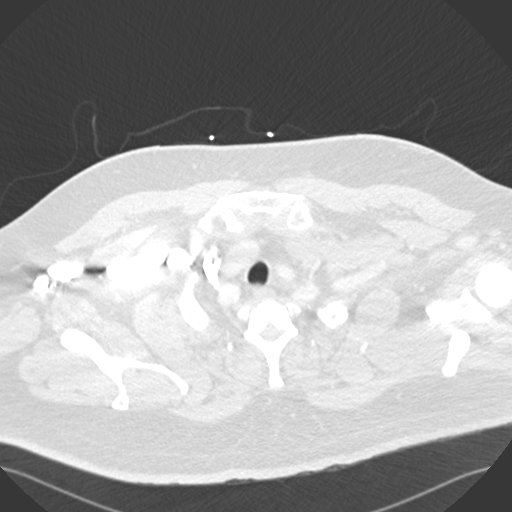
[im 267/282  soft-tissue]
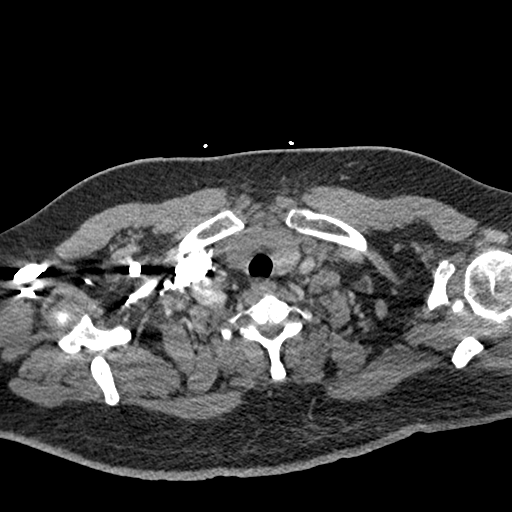

[Series 8: coronal mpr · coronal · 0.59mm/px · 2 of 96 slices shown]
[im 32/96  soft-tissue]
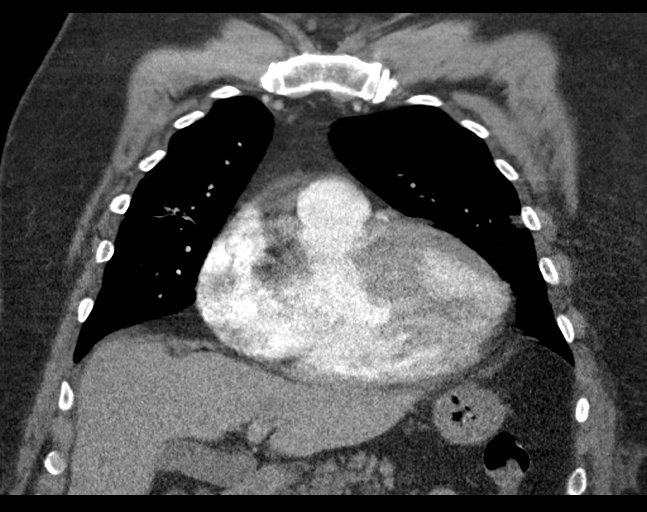
[im 64/96  soft-tissue]
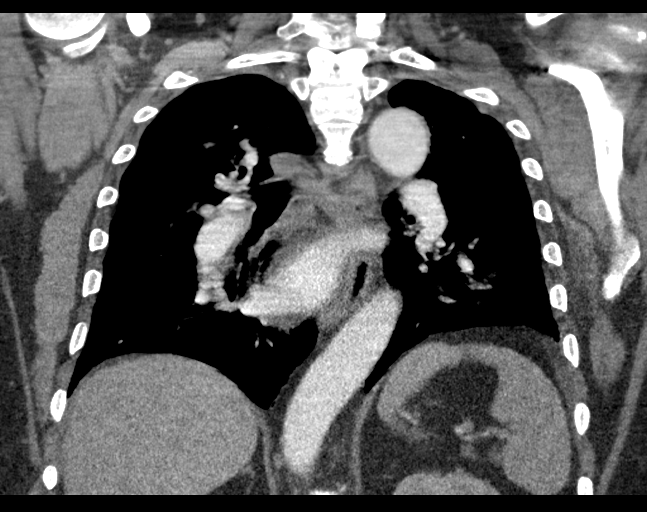

[18 of 46 positions shown; findings below may reference images not displayed]

FINDINGS: Cardiovascular: There is moderate cardiomegaly. No pericardial
effusion. There is mild atherosclerotic calcification of the
thoracic aorta. Mildly dilated ascending aorta measuring up to
cm in greatest axial diameter. The origins of the great vessels of
the aortic arch appear patent. Mild dilatation of the main pulmonary
trunk suggestive of underlying pulmonary hypertension. Evaluation of
the pulmonary artery is is limited due to suboptimal opacification
of the peripheral branches. No large or central pulmonary artery
embolus identified. V/Q scan may provide better evaluation if there
is high clinical concern for acute PE.

Mediastinum/Nodes: There is no hilar or mediastinal adenopathy.
Esophagus and the thyroid gland appear unremarkable. No mediastinal
fluid collection.

Lungs/Pleura: There are bilateral upper lobe streaky densities as
well as a more focal area of ground-glass opacity in the left upper
lobe. Findings most consistent with an infectious process. Clinical
correlation is recommended. Bilateral upper lobe linear
atelectasis/scarring noted. There is no pleural effusion or
pneumothorax. The central airways are patent.

Upper Abdomen:  No acute findings.

Musculoskeletal: No chest wall abnormality. No acute or significant
osseous findings.

Review of the MIP images confirms the above findings.
IMPRESSION: 1. No CT evidence of central pulmonary artery embolus. V/Q scan may
provide better evaluation if there is high clinical concern for
acute PE.
2. Bilateral upper lobe streaky and airspace densities concerning
for infiltrate. Clinical correlation recommended.
3. Cardiomegaly.

## 2018-09-26 ENCOUNTER — Telehealth: Payer: Self-pay | Admitting: Internal Medicine

## 2018-09-26 NOTE — Telephone Encounter (Signed)
Received records request from disability determination services.  Request has been faxed to [employer]. Received fax confirmation.

## 2018-10-01 ENCOUNTER — Ambulatory Visit: Payer: Self-pay | Admitting: *Deleted

## 2018-10-01 NOTE — Telephone Encounter (Signed)
Patient reports having severe pain on the right from his hip to ankle. It began on Saturday 5/2 eased off yesterday and returned this morning. Rates it a 4 now but was a "20" and he could not walk on it. Stated the thigh area pain reaches around the entire leg, the lower leg pain is on the outer aspect. Reports the lower leg is slightly larger than the left. Foot is not discolored nor cool to touch. The lower leg area is slightly larger than his left calf area. He has chronic back pain and under the care of another physician for it but reports it doesn't hurt when this leg pain comes on. Denies CP/SOB/Fever.No rashes noted. He is able to walk around this afternoon. He has a history of blood clots and is on ASA. Advised ED at this time to be evaluated. Stated he did not want to go. Reviewed his risk and strongly recommended the ED now. Reviewed urgent symptoms of CP or SOB he will need immediate emergency intervention. Stated he understood. Routing to PCP for contact in the am. Patient understands he won't be contacted until the am.  Reason for Disposition . History of prior "blood clot" in leg or lungs (i.e., deep vein thrombosis, pulmonary embolism) . [1] SEVERE pain (e.g., excruciating, unable to do any normal activities) AND [2] not improved after 2 hours of pain medicine . [1] Thigh or calf pain AND [2] only 1 side AND [3] present > 1 hour . [1] Thigh, calf, or ankle swelling AND [2] only 1 side  Answer Assessment - Initial Assessment Questions 1. ONSET: "When did the pain start?"      09/28/18  2. LOCATION: "Where is the pain located?"      Right hip down to ankle 3. PAIN: "How bad is the pain?"    (Scale 1-10; or mild, moderate, severe)   -  MILD (1-3): doesn't interfere with normal activities    -  MODERATE (4-7): interferes with normal activities (e.g., work or school) or awakens from sleep, limping    -  SEVERE (8-10): excruciating pain, unable to do any normal activities, unable to walk  4-20 he states. Unable to walk at times 4. WORK OR EXERCISE: "Has there been any recent work or exercise that involved this part of the body?"     no 5. CAUSE: "What do you think is causing the leg pain?"    No idea 6. OTHER SYMPTOMS: "Do you have any other symptoms?" (e.g., chest pain, back pain, breathing difficulty, swelling, rash, fever, numbness, weakness)     Pain in rt hip and leg. 7. PREGNANCY: "Is there any chance you are pregnant?" "When was your last menstrual period?"     na  Protocols used: LEG PAIN-A-AH

## 2018-10-02 ENCOUNTER — Ambulatory Visit (INDEPENDENT_AMBULATORY_CARE_PROVIDER_SITE_OTHER): Payer: Medicaid Other | Admitting: Family Medicine

## 2018-10-02 ENCOUNTER — Encounter: Payer: Self-pay | Admitting: Family Medicine

## 2018-10-02 ENCOUNTER — Other Ambulatory Visit: Payer: Self-pay

## 2018-10-02 ENCOUNTER — Ambulatory Visit (INDEPENDENT_AMBULATORY_CARE_PROVIDER_SITE_OTHER)
Admission: RE | Admit: 2018-10-02 | Discharge: 2018-10-02 | Disposition: A | Discharge status: Home / Self Care | Payer: Medicaid Other | Source: Ambulatory Visit | Attending: Family Medicine | Admitting: Family Medicine

## 2018-10-02 VITALS — BP 104/74 | HR 73 | Temp 98.6°F | Resp 18 | Ht 68.0 in | Wt 314.2 lb

## 2018-10-02 DIAGNOSIS — M25551 Pain in right hip: Secondary | ICD-10-CM

## 2018-10-02 NOTE — Patient Instructions (Signed)
I will call you with your xray results  Take ibuprofen, can alternate with tylenol, heat, gentle range of motion

## 2018-10-02 NOTE — Telephone Encounter (Signed)
Noted  

## 2018-10-02 NOTE — Progress Notes (Signed)
Subjective:    Patient ID: Jonathon Graham, male    DOB: 25-Jun-1955, 63 y.o.   MRN: [id number]  HPI This is a 63 yo male who presents today with severe right hip pain that started 09/28/2018. He awoke with pain. No known injury/trauma.  He had some swelling on his right lateral calf when the pain started.  This is resolved.  He has been taking some ibuprofen but has not had any today.  No recent falls.  Pain has improved significantly in the last couple of days and he rates it at about a 3-4 out of 10 today.  He has chronic back pain and this is unchanged.  Past Medical History:  Diagnosis Date  . Arthritis   . Benign prostatic hyperplasia (BPH) with urinary urgency    nodular  . COPD (chronic obstructive pulmonary disease) (HCC)   . Depression   . Diabetes mellitus without complication (HCC)   . Essential hypertension   . Fibromyalgia, primary   . Hyperlipidemia   . Hypertension   . Obesity    No past surgical history on file. Family History  Problem Relation Age of Onset  . Heart disease Father   . Diabetes Mellitus II Sister   . Myasthenia gravis Sister   . Fibrocystic breast disease Sister   . Hypertension Sister   . Diabetes Mellitus II Brother    Social History   Tobacco Use  . Smoking status: Former Smoker    Types: Cigarettes    Last attempt to quit: 03/28/2017    Years since quitting: 1.5  . Smokeless tobacco: Never Used  . Tobacco comment: 3-4 daily  Substance Use Topics  . Alcohol use: No    Alcohol/week: 0.0 standard drinks  . Drug use: No      Review of Systems Per HPI    Objective:   Physical Exam Vitals signs reviewed.  Constitutional:      General: He is not in acute distress.    Appearance: He is obese. He is not ill-appearing, toxic-appearing or diaphoretic.  Eyes:     Conjunctiva/sclera: Conjunctivae normal.  Cardiovascular:     Rate and Rhythm: Normal rate.  Pulmonary:     Effort: Pulmonary effort is normal.  Musculoskeletal:   Right hip: He exhibits decreased range of motion, decreased strength and tenderness.     Comments: Ambulating without assistance.  Noticeable limp.  Right lower extremity without any redness or swelling.  Skin normal color and texture.  Neurological:     Mental Status: He is alert and oriented to person, place, and time.  Psychiatric:        Mood and Affect: Mood normal.        Behavior: Behavior normal.        Thought Content: Thought content normal.        Judgment: Judgment normal.       BP 104/74 (BP Location: Right Arm, Patient Position: Sitting, Cuff Size: Large)   Pulse 73   Temp 98.6 F (37 C)   Resp 18   Ht 5\' 8"  (1.727 m)   Wt (!) 314 lb 4 oz (142.5 kg)   SpO2 95%   BMI 47.78 kg/m  Wt Readings from Last 3 Encounters:  10/02/18 (!) 314 lb 4 oz (142.5 kg)  07/15/18 (!) 311 lb 12.8 oz (141.4 kg)  07/12/18 277 lb (125.6 kg)       Assessment & Plan:  1. Right hip pain -Continue over-the-counter ibuprofen, can add heat. I  will notify him of x-ray report and will add gentle range of motion if x-ray is okay, may need orthopedic follow-up - DG Hip Unilat W OR W/O Pelvis 2-3 Views Right; Future   [name], FNP-BC  [facility], [facility]  10/02/2018 11:44 AM

## 2018-10-02 NOTE — Telephone Encounter (Addendum)
I spoke with pt; pt did not go to ED(no transportation); no fever, cough,SOB,CP,S/T, H/A,diarrhea,and no loss of smell or taste; pt does have muscle pain in rt leg. Pt scheduled in office visit with Harlin Heys FNP 10/02/18 at 10:30.

## 2018-10-06 ENCOUNTER — Other Ambulatory Visit: Payer: Self-pay | Admitting: Family Medicine

## 2018-10-14 ENCOUNTER — Telehealth: Payer: Self-pay

## 2018-10-14 NOTE — Telephone Encounter (Signed)
Disability paperwork received from [facility]. Faxed to [employer] with confirmation.

## 2018-10-18 ENCOUNTER — Ambulatory Visit: Payer: Self-pay | Admitting: *Deleted

## 2018-10-18 ENCOUNTER — Ambulatory Visit (INDEPENDENT_AMBULATORY_CARE_PROVIDER_SITE_OTHER): Payer: Medicaid Other | Admitting: Internal Medicine

## 2018-10-18 ENCOUNTER — Other Ambulatory Visit: Payer: Self-pay

## 2018-10-18 DIAGNOSIS — J4540 Moderate persistent asthma, uncomplicated: Secondary | ICD-10-CM

## 2018-10-18 DIAGNOSIS — E662 Morbid (severe) obesity with alveolar hypoventilation: Secondary | ICD-10-CM | POA: Diagnosis not present

## 2018-10-18 DIAGNOSIS — Z20822 Contact with and (suspected) exposure to covid-19: Secondary | ICD-10-CM

## 2018-10-18 DIAGNOSIS — J209 Acute bronchitis, unspecified: Secondary | ICD-10-CM

## 2018-10-18 DIAGNOSIS — G4733 Obstructive sleep apnea (adult) (pediatric): Secondary | ICD-10-CM | POA: Diagnosis not present

## 2018-10-18 MED ORDER — PREDNISONE 10 MG (21) PO TBPK: ORAL_TABLET | ORAL | 0 refills | Status: DC

## 2018-10-18 MED ORDER — AZITHROMYCIN 250 MG PO TABS: 250.0000 mg | ORAL_TABLET | Freq: Every day | ORAL | 0 refills | Status: DC

## 2018-10-18 NOTE — Telephone Encounter (Signed)
Referred for COVID-19 testing due to exposure to his mother who is questionably positive for COVID-19.  No symptoms.  Ordered by Dr. Shane Crutch from [facility] in [place]. I scheduled him for Tuesday 10/22/2018 at 9:30 at the [facility] location to be tested for COVID-19.   Pt aware to wear a mask and stay in the car.

## 2018-10-18 NOTE — Progress Notes (Signed)
[facility] Consultation      Assessment and Plan:  63 yo male with sleep apnea, morbid obesity, dyspnea, possible obesity hypoventilation.    Acute on chronic bronchitis, group B. --Now with exacerbation requiring hospitalization.  --Continue Advair twice daily, asked to use nebulizer more often. -Given course of prednisone taper, Z-Pak. - We will check COVID-19 test, patient reminded to stay home until negative test.  Tobacco abuse. --He has stopped smoking and is encouraged not to restart.   Obesity/OSA with hypoventilation syndrome. -Currently has VPAP auto device max IPAP 25, minimum EPAP 16, pressure support 5. -Currently not using due to mask issues, he is [facility].  Recommend continue using BiPAP nightly.  Currently is having mask fit issues and is working to get a nasal mask.  Insomnia with excessive daytime sleepiness, possible shift workers disorder. -He has multiple etiologies for his insomnia and daytime sleepiness, which may include peripheral neuropathy, poor sleep hygiene, anxiety. He may also have some degree of circadian rhythm disorder given his early rising time at 2:30 AM. -Advised him not to take naps during the day, we discussed improvement of sleep hygiene.   Obesity. -Severe morbid obesity with BMI of 51, this may be contributing to obesity hypoventilation syndrome and elevated CO2 levels. --Continue using PAP every night.   Dyspnea with restrictive lung disease, likely due to obesity. -Patient is currently on advair but using less since losing weight.  -His dyspnea may be multifactorial from asthma, as well as severe morbid obesity. Discussed the importance of weight loss going forwards.   Return in about 3 months (around 01/18/2019).  Date: 10/18/2018  MRN# [id number] Jonathon Graham [birth year]    Carla Whilden is a 63 y.o. old male seen in consultation for chief complaint of: dyspnea.       HPI:   The patient is a 63 yo male with a history of OSA, severe daytime sleepiness-- he could not afford CPAP therefore it was taken away.  He was sent for a new sleep study to requalify for CPAP, and a new CPAP was ordered.  He had started at CPAP but had mask issues and it help leaking, therefore asked [facility] for a new mask and is currently waiting for a new one.  He was also noted to have insomnia and on several medications which could be causing daytime sleepiness. He has been using advair once daily, albuterol at night.  He has stopped smoking, he is not vaping.   He has been doing well until about a 2 weeks ago, he has been having dyspnea, variable during day and night, but not all of the time. He had been doing well where he could walk around the block.  He uses albuterol 5 times per day, up from once per day and feels that it helps. He is using advair twice per day and feels that it helps. He has a nebulizer twice per week.  He denies fever, he thinks that he has chills, his check his temp and he does not have a fever.  He is coughing, no hemoptysis. He is doing well with his vpap machine, but he is not using it every night due to mask issues he has requested a new mask.   **VPAP auto compliance, usage greater than 4 hours is 0/90 days.  Current settings max IPAP 25, minimum EPAP 16, pressure support 5. **Pulmonary function test 12/20/2017>> FVC 65% predicted, FEV1 63% predicted.  There  is no improvement with bronchodilator therapy.  Flow volume loop appears unremarkable.  TLC 78% predicted.  Ratio was normal.  Diffusion capacity is moderately reduced at 54%. -Overall this test shows moderate restrictive lung disease. **ABG 02/01/17>>7.39/53/52/31-- chronic hypercapnic and hypoxic respiratory failure.  **CT chest 11/19/2016>> appears lung volume secondary to body habitus with bibasilar atelectasis, otherwise lungs are unremarkable.  Review of outside sleep study 04/17/13; split-night AHI equals  101.4 consistent with very severe sleep apnea, no central apnea noted. Patient was started on CPAP, titrated to a pressure of 16. At this pressure lowest sat was 83%. Patient slept for 51 minutes. REM supine of 9.4 minutes. AHI was 6.4. At CPAP of 16, 8.1. A CPAP of 15. The patient was recommended to use CPAP at 16. Overall this test is insistent with very severe. Obstructive sleep apnea, sleep-related hypoxemia, CPAP was started at 16.  **Blood gas 02/01/17>> 7.39/53/52/32.1. Consistent with chronic hypercapnic and hypoxic respiratory failure.  Medication:    Current Outpatient Medications:  .  acetaminophen (TYLENOL) 500 MG tablet, Take 1 tablet (500 mg total) by mouth every 8 (eight) hours as needed., Disp: 30 tablet, Rfl: 0 .  albuterol (PROAIR HFA) 108 (90 Base) MCG/ACT inhaler, Inhale 1-2 puffs into the lungs every 4 (four) hours as needed for wheezing or shortness of breath., Disp: 1 Inhaler, Rfl: 5 .  albuterol (PROVENTIL) (2.5 MG/3ML) 0.083% nebulizer solution, Take 3 mLs (2.5 mg total) by nebulization every 6 (six) hours as needed for wheezing or shortness of breath., Disp: 150 mL, Rfl: 6 .  aspirin 81 MG tablet, Take 243 mg by mouth daily. Takes 4 16m aspirin daily., Disp: , Rfl:  .  Aspirin-Calcium Carbonate (BAYER WOMENS) 8905-502-6999MG TABS, Take by mouth., Disp: , Rfl:  .  atorvastatin (LIPITOR) 20 MG tablet, TAKE 1 TABLET BY MOUTH ONCE DAILY, Disp: 30 tablet, Rfl: 11 .  betamethasone valerate ointment (VALISONE) 0.1 %, Apply 1 application topically 2 (two) times daily. For up to 10 days., Disp: 30 g, Rfl: 0 .  blood glucose meter kit and supplies KIT, Dispense based on patient and insurance preference. Use up to four times daily as directed. (FOR ICD-9 250.00, 250.01)., Disp: 1 each, Rfl: 0 .  clotrimazole (LOTRIMIN) 1 % cream, Apply 1 application 2 (two) times daily topically. For 7 days, Disp: 28 g, Rfl: 0 .  diclofenac (VOLTAREN) 75 MG EC tablet, Take 1 tablet (75 mg total) by mouth 2  (two) times daily as needed., Disp: 60 tablet, Rfl: 1 .  DULoxetine (CYMBALTA) 30 MG capsule, Take 1 capsule (30 mg total) by mouth daily., Disp: 30 capsule, Rfl: 2 .  Elastic Bandages & Supports (MEDICAL COMPRESSION SOCKS) MISC, 1 each by Does not apply route daily., Disp: 1 each, Rfl: 0 .  Fluticasone-Salmeterol (ADVAIR DISKUS) 250-50 MCG/DOSE AEPB, Inhale 1 puff into the lungs 2 (two) times daily., Disp: 60 each, Rfl: 3 .  furosemide (LASIX) 40 MG tablet, Take 1 tablet by mouth once daily, Disp: 90 tablet, Rfl: 0 .  gabapentin (NEURONTIN) 300 MG capsule, TAKE 1 CAPSULE BY MOUTH THREE TIMES DAILY, Disp: 270 capsule, Rfl: 0 .  hydrochlorothiazide (MICROZIDE) 12.5 MG capsule, Take 1 capsule by mouth once daily, Disp: 90 capsule, Rfl: 2 .  ibuprofen (ADVIL,MOTRIN) 800 MG tablet, TAKE 1 TABLET BY MOUTH EVERY 8 HOURS AS NEEDED, Disp: 60 tablet, Rfl: 0 .  metFORMIN (GLUCOPHAGE) 1000 MG tablet, TAKE 1 TABLET BY MOUTH TWICE DAILY WITH A MEAL. PATIENT NEEDS TO CALL  FOR DIABETIC FOLLOW UP APPOINTMENT., Disp: 180 tablet, Rfl: 0 .  oxyCODONE-acetaminophen (PERCOCET/ROXICET) 5-325 MG tablet, Take 1 tablet by mouth 2 (two) times daily as needed for severe pain., Disp: 10 tablet, Rfl: 0 .  tamsulosin (FLOMAX) 0.4 MG CAPS capsule, Take 1 capsule by mouth once daily, Disp: 90 capsule, Rfl: 0  Current Facility-Administered Medications:  .  ipratropium-albuterol (DUONEB) 0.5-2.5 (3) MG/3ML nebulizer solution 3 mL, 3 mL, Nebulization, Q6H, Krebs, Amy Lauren, NP   Allergies:  Penicillins  Review of Systems:  Constitutional: Feels well. Cardiovascular: Denies chest pain, exertional chest pain.  Pulmonary: Denies hemoptysis, pleuritic chest pain.   The remainder of systems were reviewed and were found to be negative other than what is documented in the HPI.    Physical Examination:  --    LABORATORY PANEL:   CBC No results for input(s): WBC, HGB, HCT, PLT in the last 168 hours.  ------------------------------------------------------------------------------------------------------------------  Chemistries  No results for input(s): NA, K, CL, CO2, GLUCOSE, BUN, CREATININE, CALCIUM, MG, AST, ALT, ALKPHOS, BILITOT in the last 168 hours.  Invalid input(s): GFRCGP ------------------------------------------------------------------------------------------------------------------  Cardiac Enzymes No results for input(s): TROPONINI in the last 168 hours. ------------------------------------------------------------  RADIOLOGY:  No results found.     Thank  you for the consultation and for allowing [facility] to assist in the care of your patient. Our recommendations are noted above.  Please contact us if we can be of further service.   Marda Stalker, M.D., F.C.C.P.  Board Certified in Internal Medicine, Pulmonary Medicine, [place], and Sleep Medicine.  [facility] Office Number: [phone]   10/18/2018

## 2018-10-18 NOTE — Patient Instructions (Signed)
We will give you a course of antibiotic and prednisone. We will send you for COVID-19 testing.  You should stay at home until you get your COVID-19 test results back and limit your exposure to other people during that time. Use your nebulizer 3-4 times per day until you feel better.

## 2018-10-22 ENCOUNTER — Other Ambulatory Visit: Payer: Self-pay

## 2018-10-22 DIAGNOSIS — J209 Acute bronchitis, unspecified: Secondary | ICD-10-CM

## 2018-10-23 LAB — NOVEL CORONAVIRUS, NAA: SARS-CoV-2, NAA: NOT DETECTED

## 2018-10-28 ENCOUNTER — Telehealth: Payer: Self-pay | Admitting: Family Medicine

## 2018-10-28 NOTE — Telephone Encounter (Signed)
Patient is requesting a call back  Patient did not go into detail as to what he was needing just that it is in regards to paperwork he is needing done.  And wanted to speak with you    PHONE-[phone]

## 2018-10-28 NOTE — Telephone Encounter (Signed)
Spoke with patient who is asking about FMLA paperwork for his granddaughters who are wanting to participate in the care of Mr. Geno mother who is in ICU. I advised him that I would look at the paperwork.

## 2018-10-29 NOTE — Telephone Encounter (Signed)
Patient stated that his niece faxed over the needed paperwork yesterday and wanted to make sure these were received..  Phone-  [phone]

## 2018-10-30 NOTE — Telephone Encounter (Signed)
Pt aware we have not receive any paperwork

## 2018-10-30 NOTE — Telephone Encounter (Signed)
I have not received any paperwork regarding his mother. Please call the patient and let him know, verify the fax and ask them to resend.

## 2018-11-18 ENCOUNTER — Other Ambulatory Visit: Payer: Self-pay | Admitting: Internal Medicine

## 2018-11-18 ENCOUNTER — Other Ambulatory Visit: Payer: Self-pay | Admitting: Family Medicine

## 2018-11-18 DIAGNOSIS — I10 Essential (primary) hypertension: Secondary | ICD-10-CM

## 2018-11-18 DIAGNOSIS — E1142 Type 2 diabetes mellitus with diabetic polyneuropathy: Secondary | ICD-10-CM

## 2018-11-18 DIAGNOSIS — E119 Type 2 diabetes mellitus without complications: Secondary | ICD-10-CM

## 2018-11-22 ENCOUNTER — Ambulatory Visit: Payer: Medicaid Other | Admitting: Family Medicine

## 2018-11-27 ENCOUNTER — Ambulatory Visit: Payer: Medicaid Other | Admitting: Family Medicine

## 2018-12-02 ENCOUNTER — Ambulatory Visit: Payer: Medicaid Other | Admitting: Family Medicine

## 2018-12-04 ENCOUNTER — Other Ambulatory Visit: Payer: Self-pay | Admitting: Family Medicine

## 2018-12-04 DIAGNOSIS — M5442 Lumbago with sciatica, left side: Secondary | ICD-10-CM

## 2018-12-04 DIAGNOSIS — G8929 Other chronic pain: Secondary | ICD-10-CM

## 2018-12-04 NOTE — Telephone Encounter (Signed)
Last OV 10/02/18 No upcoming OVs scheduled  Last filled 09/11/18 #60 x 0 refills.

## 2018-12-13 ENCOUNTER — Other Ambulatory Visit: Payer: Self-pay

## 2018-12-13 ENCOUNTER — Ambulatory Visit (INDEPENDENT_AMBULATORY_CARE_PROVIDER_SITE_OTHER): Payer: Medicaid Other | Admitting: Family Medicine

## 2018-12-13 VITALS — BP 123/69 | HR 63 | Temp 98.0°F | Resp 20 | Ht 68.0 in | Wt 302.5 lb

## 2018-12-13 DIAGNOSIS — M79642 Pain in left hand: Secondary | ICD-10-CM

## 2018-12-13 DIAGNOSIS — M25552 Pain in left hip: Secondary | ICD-10-CM

## 2018-12-13 DIAGNOSIS — M79641 Pain in right hand: Secondary | ICD-10-CM

## 2018-12-13 NOTE — Progress Notes (Signed)
Subjective:    Patient ID: Jonathon Graham, male    DOB: 1955/11/21, 63 y.o.   MRN: [id number]  HPI This is a 63 yo male who presents today with continued intermittent left hip pain, bilateral hand pain.  Left hip pain- he has known osteoarthritis at multiple sites.  He has been having some left hip pain that is worse after prolonged sitting.  Is getting some relief with ibuprofen 800 mg twice daily.  2 months ago his right hip was bothering him but has improved.  Pain goes to the top of his left leg, no numbness or tingling or falls.  No known injury or overuse. Bilateral hand pain- off and on for a couple of months, feels achy in his joints.  His wife has been applying topical pain relievers and he gets some relief with ibuprofen.  No joint swelling noted Weight loss-his mother continues to be in the intensive care [place] in [place] and he has been worried about her and has not been eating as much.  Past Medical History:  Diagnosis Date  . Arthritis   . Benign prostatic hyperplasia (BPH) with urinary urgency    nodular  . COPD (chronic obstructive pulmonary disease) (HCC)   . Depression   . Diabetes mellitus without complication (HCC)   . Essential hypertension   . Fibromyalgia, primary   . Hyperlipidemia   . Hypertension   . Obesity    No past surgical history on file. Family History  Problem Relation Age of Onset  . Heart disease Father   . Diabetes Mellitus II Sister   . Myasthenia gravis Sister   . Fibrocystic breast disease Sister   . Hypertension Sister   . Diabetes Mellitus II Brother    Social History   Tobacco Use  . Smoking status: Former Smoker    Types: Cigarettes    Quit date: 03/28/2017    Years since quitting: 1.7  . Smokeless tobacco: Never Used  . Tobacco comment: 3-4 daily  Substance Use Topics  . Alcohol use: No    Alcohol/week: 0.0 standard drinks  . Drug use: No      Review of Systems Per HPI    Objective:   Physical Exam Vitals signs  reviewed.  Constitutional:      General: He is not in acute distress.    Appearance: Normal appearance. He is obese. He is not ill-appearing, toxic-appearing or diaphoretic.  HENT:     Head: Normocephalic and atraumatic.  Eyes:     Conjunctiva/sclera: Conjunctivae normal.  Cardiovascular:     Rate and Rhythm: Normal rate.  Pulmonary:     Effort: Pulmonary effort is normal.  Musculoskeletal:     Left hip: He exhibits tenderness (over gluteus). He exhibits normal range of motion and normal strength.     Comments: Bilateral hands with good ROM, strength, no joint deformity or swelling.   Skin:    General: Skin is warm and dry.  Neurological:     Mental Status: He is alert and oriented to person, place, and time.  Psychiatric:        Mood and Affect: Mood normal.        Behavior: Behavior normal.        Thought Content: Thought content normal.        Judgment: Judgment normal.       BP 123/69   Pulse 63   Temp 98 F (36.7 C)   Resp 20   Ht 5\' 8"  (1.727 m)  Wt (!) 302 lb 8 oz (137.2 kg)   SpO2 98%   BMI 45.99 kg/m  Wt Readings from Last 3 Encounters:  12/13/18 (!) 302 lb 8 oz (137.2 kg)  10/02/18 (!) 314 lb 4 oz (142.5 kg)  07/15/18 (!) 311 lb 12.8 oz (141.4 kg)       Assessment & Plan:  1. Left hip pain - No worrisome findings on exam or history, likely osteoarthritis and possible referred from low back - discussed continued otc treatments, heat/ice, activity and ROM - RTC precautions reviewed  2. Bilateral hand pain - Again, likely osteoarthritis, no joint changes - continue current treatment, can also try otc Voltaren gel, heat/ice   - follow up in file for follow up of chronic medical conditiona  Clarene Reamer, FNP-BC  [facility] at [facility], [place]  12/14/2018 8:32 AM

## 2018-12-13 NOTE — Patient Instructions (Addendum)
Good to see you today  I think you have arthritis in your hands and hips  Continue with the ibuprofen 800 mg twice a day  Movement is your friend, walk as much as possible  Try over the counter Voltaren Gel ([facility], less than $20)

## 2018-12-14 ENCOUNTER — Encounter: Payer: Self-pay | Admitting: Family Medicine

## 2018-12-16 ENCOUNTER — Other Ambulatory Visit: Payer: Self-pay | Admitting: Family Medicine

## 2018-12-16 DIAGNOSIS — N401 Enlarged prostate with lower urinary tract symptoms: Secondary | ICD-10-CM

## 2018-12-16 DIAGNOSIS — M797 Fibromyalgia: Secondary | ICD-10-CM

## 2018-12-16 DIAGNOSIS — R351 Nocturia: Secondary | ICD-10-CM

## 2018-12-16 DIAGNOSIS — F322 Major depressive disorder, single episode, severe without psychotic features: Secondary | ICD-10-CM

## 2018-12-26 ENCOUNTER — Ambulatory Visit (INDEPENDENT_AMBULATORY_CARE_PROVIDER_SITE_OTHER): Payer: Medicaid Other | Admitting: Psychology

## 2018-12-26 DIAGNOSIS — F3289 Other specified depressive episodes: Secondary | ICD-10-CM | POA: Diagnosis not present

## 2018-12-28 ENCOUNTER — Ambulatory Visit: Payer: Medicaid Other | Admitting: Psychology

## 2018-12-30 ENCOUNTER — Encounter: Payer: Self-pay | Admitting: Family Medicine

## 2018-12-30 ENCOUNTER — Other Ambulatory Visit: Payer: Self-pay

## 2018-12-30 ENCOUNTER — Ambulatory Visit (INDEPENDENT_AMBULATORY_CARE_PROVIDER_SITE_OTHER): Payer: Medicaid Other | Admitting: Family Medicine

## 2018-12-30 VITALS — BP 118/60 | HR 73 | Temp 97.3°F | Ht 68.0 in | Wt 302.0 lb

## 2018-12-30 DIAGNOSIS — F322 Major depressive disorder, single episode, severe without psychotic features: Secondary | ICD-10-CM | POA: Diagnosis not present

## 2018-12-30 DIAGNOSIS — M797 Fibromyalgia: Secondary | ICD-10-CM | POA: Diagnosis not present

## 2018-12-30 DIAGNOSIS — E1142 Type 2 diabetes mellitus with diabetic polyneuropathy: Secondary | ICD-10-CM | POA: Diagnosis not present

## 2018-12-30 DIAGNOSIS — M79641 Pain in right hand: Secondary | ICD-10-CM

## 2018-12-30 DIAGNOSIS — M79642 Pain in left hand: Secondary | ICD-10-CM | POA: Diagnosis not present

## 2018-12-30 LAB — HEMOGLOBIN A1C: Hgb A1c MFr Bld: 6.9 % — ABNORMAL HIGH (ref 4.6–6.5)

## 2018-12-30 LAB — HIGH SENSITIVITY CRP: CRP, High Sensitivity: 5.49 mg/L — ABNORMAL HIGH (ref 0.000–5.000)

## 2018-12-30 MED ORDER — DULOXETINE HCL 60 MG PO CPEP: 60.0000 mg | ORAL_CAPSULE | Freq: Every day | ORAL | 1 refills | Status: DC

## 2018-12-30 NOTE — Patient Instructions (Signed)
Increase your duloxetine to 60 mg a day- take two of the 30 mg then get the 60 mg from the pharmacy  Stop at lab, I have put in a referral for you to see the hand specialist

## 2018-12-30 NOTE — Progress Notes (Signed)
Subjective:    Patient ID: Jonathon Graham, male    DOB: June 11, 1955, 63 y.o.   MRN: [id number]  HPI This is a 63 yo male who presents today with bilateral hand pain x 1 month. Worse over last couple of days. Pain in bones of hands, constant. Pain from wrist down to fingers. No numbness or tingling in arms. Taking ibuprofen 800 mg qd and gabapentin 300 mg tid. No improvement.  Applying topical pain relievers as well without improvement.  Mother continues to be at [place] on vent.  He is driving 4-0/9 hours each way daily.  He continues to have counseling with Caro Hight which she finds helpful.  He reports that he feels very depressed and his counselor has suggested that he increase his duloxetine.  Past Medical History:  Diagnosis Date  . Arthritis   . Benign prostatic hyperplasia (BPH) with urinary urgency    nodular  . COPD (chronic obstructive pulmonary disease) ([place])   . Depression   . Diabetes mellitus without complication ([place])   . Essential hypertension   . Fibromyalgia, primary   . Hyperlipidemia   . Hypertension   . Obesity    History reviewed. No pertinent surgical history. Family History  Problem Relation Age of Onset  . Heart disease Father   . Diabetes Mellitus II Sister   . Myasthenia gravis Sister   . Fibrocystic breast disease Sister   . Hypertension Sister   . Diabetes Mellitus II Brother    Social History   Tobacco Use  . Smoking status: Former Smoker    Types: Cigarettes    Quit date: 03/28/2017    Years since quitting: 1.7  . Smokeless tobacco: Never Used  . Tobacco comment: 3-4 daily  Substance Use Topics  . Alcohol use: No    Alcohol/week: 0.0 standard drinks  . Drug use: No      Review of Systems Per HPI    Objective:   Physical Exam Vitals signs reviewed.  Constitutional:      General: He is not in acute distress.    Appearance: Normal appearance. He is obese. He is not ill-appearing, toxic-appearing or diaphoretic.  HENT:     Head:  Normocephalic and atraumatic.  Cardiovascular:     Rate and Rhythm: Normal rate.     Pulses: Normal pulses.  Pulmonary:     Effort: Pulmonary effort is normal.  Musculoskeletal:     Comments: Bilateral hand with mild puffiness. No discoloration. Palpable radial pulses. Some decreased grip strength. Normal finger ROM and strength. No palmar tenderness. Diffuse dorsal tenderness, no palpable deformities.   Skin:    General: Skin is warm and dry.  Neurological:     Mental Status: He is alert and oriented to person, place, and time.  Psychiatric:        Mood and Affect: Mood normal.        Behavior: Behavior normal.        Thought Content: Thought content normal.        Judgment: Judgment normal.        BP 118/60 (BP Location: Left Arm, Patient Position: Sitting, Cuff Size: Large)   Pulse 73   Temp (!) 97.3 F (36.3 C) (Temporal)   Ht 5\' 8"  (1.727 m)   Wt (!) 302 lb (137 kg)   SpO2 92%   BMI 45.92 kg/m  Wt Readings from Last 3 Encounters:  12/30/18 (!) 302 lb (137 kg)  12/13/18 (!) 302 lb 8 oz (137.2 kg)  10/02/18 (!) 314 lb 4 oz (142.5 kg)       Assessment & Plan:  1. Depression, major, single episode, severe (HCC) -We will increase his duloxetine from 30 mg to 60 mg a day.  New prescription sent to his pharmacy, he can take to his 30 mg tablets a day until he uses his current supply and get his new prescription filled.  2. Fibromyalgia - see #1, will increase duloxetine  3. Bilateral hand pain -Unclear etiology, no trauma known.  Likely worsened by long periods of daily driving. - Rheumatoid factor - High sensitivity CRP - Ambulatory referral to Hand Surgery  4. Type 2 diabetes, controlled, with peripheral neuropathy (HCC) - Hemoglobin A1c   Olean Reeeborah Keila Turan, FNP-BC  [facility], [facility]  01/02/2019 9:46 AM

## 2018-12-31 LAB — RHEUMATOID FACTOR: Rheumatoid fact SerPl-aCnc: <14 IU/mL (ref <14)

## 2019-01-02 ENCOUNTER — Encounter: Payer: Self-pay | Admitting: Family Medicine

## 2019-01-03 ENCOUNTER — Other Ambulatory Visit: Payer: Self-pay | Admitting: Family Medicine

## 2019-01-06 ENCOUNTER — Ambulatory Visit (INDEPENDENT_AMBULATORY_CARE_PROVIDER_SITE_OTHER): Payer: Medicaid Other | Admitting: Psychology

## 2019-01-06 DIAGNOSIS — F3289 Other specified depressive episodes: Secondary | ICD-10-CM | POA: Diagnosis not present

## 2019-01-15 ENCOUNTER — Ambulatory Visit (INDEPENDENT_AMBULATORY_CARE_PROVIDER_SITE_OTHER): Payer: Medicaid Other | Admitting: Psychology

## 2019-01-15 DIAGNOSIS — F3289 Other specified depressive episodes: Secondary | ICD-10-CM

## 2019-01-23 ENCOUNTER — Ambulatory Visit (INDEPENDENT_AMBULATORY_CARE_PROVIDER_SITE_OTHER): Payer: Medicaid Other | Admitting: Psychology

## 2019-01-23 DIAGNOSIS — F3289 Other specified depressive episodes: Secondary | ICD-10-CM

## 2019-02-06 ENCOUNTER — Ambulatory Visit (INDEPENDENT_AMBULATORY_CARE_PROVIDER_SITE_OTHER): Payer: Medicaid Other | Admitting: Psychology

## 2019-02-06 DIAGNOSIS — F3289 Other specified depressive episodes: Secondary | ICD-10-CM

## 2019-02-08 ENCOUNTER — Ambulatory Visit: Payer: Medicaid Other | Admitting: Psychology

## 2019-02-15 ENCOUNTER — Other Ambulatory Visit: Payer: Self-pay | Admitting: Family Medicine

## 2019-02-15 DIAGNOSIS — M5441 Lumbago with sciatica, right side: Secondary | ICD-10-CM

## 2019-02-15 DIAGNOSIS — G8929 Other chronic pain: Secondary | ICD-10-CM

## 2019-02-15 DIAGNOSIS — E119 Type 2 diabetes mellitus without complications: Secondary | ICD-10-CM

## 2019-02-15 DIAGNOSIS — I10 Essential (primary) hypertension: Secondary | ICD-10-CM

## 2019-02-17 NOTE — Telephone Encounter (Signed)
Ibuprofen: last filled on 12/04/2018 #60 with 0 refill.  Furosemide: last filled on 11/18/2018 #90 with 0 refill.  LOV 12/30/2018. Next appointment on 03/07/2019 for follow up

## 2019-02-22 ENCOUNTER — Ambulatory Visit: Payer: Medicaid Other | Admitting: Psychology

## 2019-02-28 ENCOUNTER — Telehealth: Payer: Self-pay | Admitting: Family Medicine

## 2019-02-28 NOTE — Telephone Encounter (Signed)
Patient is requesting a call back to discuss something's going on with his mom. He did not give much detail when asking what the call back was in regards to, just that he would like a call back

## 2019-02-28 NOTE — Telephone Encounter (Signed)
Called and spoke with patient who wanted to discuss his mother. She has been at [place] on a vent and, according to the patient, the hospital wants to discharge her home. He does not feel that he can manage her at home. His mother is responding with eye movement. He has her on waiting lists for 2 facilities and is waiting to hear about a vacancy.

## 2019-03-07 ENCOUNTER — Other Ambulatory Visit: Payer: Self-pay

## 2019-03-07 ENCOUNTER — Ambulatory Visit (INDEPENDENT_AMBULATORY_CARE_PROVIDER_SITE_OTHER): Payer: Medicaid Other | Admitting: Family Medicine

## 2019-03-07 ENCOUNTER — Encounter: Payer: Self-pay | Admitting: Family Medicine

## 2019-03-07 VITALS — BP 104/60 | HR 82 | Temp 98.0°F | Ht 68.0 in | Wt 296.1 lb

## 2019-03-07 DIAGNOSIS — Z6841 Body Mass Index (BMI) 40.0 and over, adult: Secondary | ICD-10-CM

## 2019-03-07 DIAGNOSIS — J439 Emphysema, unspecified: Secondary | ICD-10-CM

## 2019-03-07 DIAGNOSIS — M25551 Pain in right hip: Secondary | ICD-10-CM | POA: Diagnosis not present

## 2019-03-07 DIAGNOSIS — G8929 Other chronic pain: Secondary | ICD-10-CM

## 2019-03-07 DIAGNOSIS — Z23 Encounter for immunization: Secondary | ICD-10-CM | POA: Diagnosis not present

## 2019-03-07 DIAGNOSIS — M5442 Lumbago with sciatica, left side: Secondary | ICD-10-CM | POA: Diagnosis not present

## 2019-03-07 DIAGNOSIS — E1142 Type 2 diabetes mellitus with diabetic polyneuropathy: Secondary | ICD-10-CM

## 2019-03-07 DIAGNOSIS — M5441 Lumbago with sciatica, right side: Secondary | ICD-10-CM

## 2019-03-07 MED ORDER — GABAPENTIN 300 MG PO CAPS: 300.0000 mg | ORAL_CAPSULE | Freq: Three times a day (TID) | ORAL | 1 refills | Status: DC

## 2019-03-07 MED ORDER — ATORVASTATIN CALCIUM 20 MG PO TABS: 20.0000 mg | ORAL_TABLET | Freq: Every day | ORAL | 3 refills | Status: DC

## 2019-03-07 MED ORDER — OXYCODONE-ACETAMINOPHEN 5-325 MG PO TABS: 1.0000 | ORAL_TABLET | Freq: Two times a day (BID) | ORAL | 0 refills | Status: DC | PRN

## 2019-03-07 NOTE — Patient Instructions (Signed)
Good to see you today  I have put in a referral to physical therapy for your hip  Please follow up in 6 months for your complete physical and lab work

## 2019-03-07 NOTE — Progress Notes (Signed)
Subjective:    Patient ID: Jonathon Graham, male    DOB: [birth year], 63 y.o.   MRN: [id number]  HPI Chief Complaint  Patient presents with  . Hip Pain    Pt notes no change in hip pain since last OV   This is a 63 year old male who presents today to follow-up chronic medical conditions.  His mother continues to be on the ventilator in [place].  He tries to see her every day.  According to the patient, they want to take her off life support because her Medicare days have run out.  The patient is opposed to her being taken off life support.  She opens her eyes when he is in the room but is otherwise noncommunicative.  Obesity/DM type 2-does not check blood sugars at home.  Lasting globin A1c 6.9.  Tolerating metformin.  He has lost quite a bit of weight over the last year.  He continues to try to make healthy food choices, eating most of his meals from home that he or his wife cooks.  He has been walking significantly more since his mother has been in the hospital.  COPD-uses his oxygen 2 L as needed, mostly at night.  Also uses CPAP regularly.  Is trying to get a nasal mask instead of full facemask which makes his mouth dry.  He had some postnasal drainage and increased shortness of breath the last week.  He reports this is resolved.  He just used his oxygen a little more frequently.  Bilateral hand pain-wearing braces with some improvement.  Hip pain-continues to have moderate to severe right hip pain, worse after sitting especially long drive and after sleeping.  A lot of stiffness.  X-ray showed osteoarthritis.  He is interested in physical therapy which significantly helped him with his back pain in the past.  He requests a little bit of pain medication for times when the pain is very severe.  He continues to take ibuprofen as needed.  Past Medical History:  Diagnosis Date  . Arthritis   . Benign prostatic hyperplasia (BPH) with urinary urgency    nodular  . COPD (chronic obstructive  pulmonary disease) ([place])   . Depression   . Diabetes mellitus without complication ([place])   . Essential hypertension   . Fibromyalgia, primary   . Hyperlipidemia   . Hypertension   . Obesity    History reviewed. No pertinent surgical history. Family History  Problem Relation Age of Onset  . Heart disease Father   . Diabetes Mellitus II Sister   . Myasthenia gravis Sister   . Fibrocystic breast disease Sister   . Hypertension Sister   . Diabetes Mellitus II Brother    Social History   Tobacco Use  . Smoking status: Former Smoker    Types: Cigarettes    Quit date: 03/28/2017    Years since quitting: 1.9  . Smokeless tobacco: Never Used  . Tobacco comment: 3-4 daily  Substance Use Topics  . Alcohol use: No    Alcohol/week: 0.0 standard drinks  . Drug use: No      Review of Systems Per HPI    Objective:   Physical Exam Vitals signs reviewed.  Constitutional:      General: He is not in acute distress.    Appearance: Normal appearance. He is obese. He is not ill-appearing, toxic-appearing or diaphoretic.  HENT:     Head: Normocephalic and atraumatic.  Cardiovascular:     Rate and Rhythm: Normal rate and regular  rhythm.     Heart sounds: Normal heart sounds.  Pulmonary:     Effort: Pulmonary effort is normal.     Breath sounds: Normal breath sounds.  Musculoskeletal:     Right lower leg: No edema.     Left lower leg: No edema.  Skin:    General: Skin is warm and dry.  Neurological:     Mental Status: He is alert and oriented to person, place, and time.  Psychiatric:        Mood and Affect: Mood normal.        Behavior: Behavior normal.        Thought Content: Thought content normal.        Judgment: Judgment normal.       BP 104/60 (BP Location: Right Arm, Patient Position: Sitting, Cuff Size: Normal)   Pulse 82   Temp 98 F (36.7 C) (Temporal)   Ht 5\' 8"  (1.727 m)   Wt 296 lb 1.9 oz (134.3 kg)   SpO2 96%   BMI 45.02 kg/m  Wt Readings from Last 3  Encounters:  03/07/19 296 lb 1.9 oz (134.3 kg)  12/30/18 (!) 302 lb (137 kg)  12/13/18 (!) 302 lb 8 oz (137.2 kg)       Assessment & Plan:  1. Right hip pain - PMPD reviewed.  He has not had any narcotic pain medicine since June 2020. will give him a small amount for severe pain. - Ambulatory referral to Physical Therapy - oxyCODONE-acetaminophen (PERCOCET/ROXICET) 5-325 MG tablet; Take 1 tablet by mouth 2 (two) times daily as needed for severe pain.  Dispense: 10 tablet; Refill: 0  2. Pulmonary emphysema, unspecified emphysema type (HCC) -Follow-up with pulmonary.  He reports that he has an appointment next week.  3. Class 3 severe obesity due to excess calories with serious comorbidity and body mass index (BMI) of 45.0 to 49.9 in adult (HCC) -Continues to have steady weight loss, encouraged his efforts to make healthy food choices and increase activity level  4. Need for influenza vaccination - Flu Vaccine QUAD 6+ mos PF IM (Fluarix Quad PF)  5. Chronic bilateral low back pain with bilateral sciatica -This is done better after physical therapy and with increased walking  6. Type 2 diabetes, controlled, with peripheral neuropathy (HCC) - atorvastatin (LIPITOR) 20 MG tablet; Take 1 tablet (20 mg total) by mouth daily.  Dispense: 90 tablet; Refill: 3 - gabapentin (NEURONTIN) 300 MG capsule; Take 1 capsule (300 mg total) by mouth 3 (three) times daily.  Dispense: 270 capsule; Refill: 1  - follow up in 6 months for office visit and lab July 2020, FNP-BC  [facility] at [facility], [facility]  03/07/2019 2:02 PM

## 2019-03-20 ENCOUNTER — Ambulatory Visit: Payer: Medicaid Other

## 2019-03-21 ENCOUNTER — Telehealth: Payer: Self-pay

## 2019-03-21 NOTE — Telephone Encounter (Addendum)
I spoke with [name] and he wanted Glenda Chroman FNP know that his mother Jonathon Graham passed on 03-30-2019. This info was put on pts chart and sent to [place].

## 2019-03-21 NOTE — Telephone Encounter (Signed)
[facility] Night - Client Nonclinical Telephone Record AccessNurse Client [facility] - Client Client Site [facility] - Night Physician Gessner, Debbie- NP Contact Type Call Who Is Calling Patient / Member / Family / Caregiver Caller Name Antario Vickerman Caller Phone Number [phone] Patient Name Jonathon Graham Patient DOB 06/16/1955 Call Type Message Only Information Provided Reason for Call Request for General Office Information Initial Comment Caller would like NP Gessner to call him ASAP. Declined nurse triage. Additional Comment Call Closed By: Eada Webb Transaction Date/Time: 03/20/2019 5:01:31 PM (ET) 

## 2019-03-24 ENCOUNTER — Ambulatory Visit (INDEPENDENT_AMBULATORY_CARE_PROVIDER_SITE_OTHER): Payer: Medicaid Other | Admitting: Psychology

## 2019-03-24 DIAGNOSIS — F3289 Other specified depressive episodes: Secondary | ICD-10-CM | POA: Diagnosis not present

## 2019-03-26 ENCOUNTER — Ambulatory Visit (HOSPITAL_COMMUNITY): Payer: Medicaid Other | Admitting: Physical Therapy

## 2019-03-28 ENCOUNTER — Encounter (HOSPITAL_COMMUNITY): Payer: Medicaid Other

## 2019-03-31 ENCOUNTER — Ambulatory Visit (INDEPENDENT_AMBULATORY_CARE_PROVIDER_SITE_OTHER): Payer: Medicaid Other | Admitting: Psychology

## 2019-03-31 ENCOUNTER — Encounter (HOSPITAL_COMMUNITY): Payer: Medicaid Other | Admitting: Physical Therapy

## 2019-03-31 DIAGNOSIS — F3289 Other specified depressive episodes: Secondary | ICD-10-CM

## 2019-04-02 ENCOUNTER — Encounter (HOSPITAL_COMMUNITY): Payer: Medicaid Other | Admitting: Physical Therapy

## 2019-04-07 ENCOUNTER — Encounter (HOSPITAL_COMMUNITY): Payer: Medicaid Other | Admitting: Physical Therapy

## 2019-04-09 ENCOUNTER — Encounter (HOSPITAL_COMMUNITY): Payer: Medicaid Other | Admitting: Physical Therapy

## 2019-04-14 ENCOUNTER — Ambulatory Visit: Payer: Medicaid Other | Admitting: Psychology

## 2019-04-14 ENCOUNTER — Encounter (HOSPITAL_COMMUNITY): Payer: Medicaid Other | Admitting: Physical Therapy

## 2019-04-16 ENCOUNTER — Encounter (HOSPITAL_COMMUNITY): Payer: Medicaid Other | Admitting: Physical Therapy

## 2019-05-02 ENCOUNTER — Ambulatory Visit (INDEPENDENT_AMBULATORY_CARE_PROVIDER_SITE_OTHER): Payer: Medicaid Other | Admitting: Family Medicine

## 2019-05-02 ENCOUNTER — Other Ambulatory Visit: Payer: Self-pay

## 2019-05-02 ENCOUNTER — Ambulatory Visit: Payer: Medicaid Other | Admitting: Family Medicine

## 2019-05-02 ENCOUNTER — Encounter: Payer: Self-pay | Admitting: Family Medicine

## 2019-05-02 VITALS — BP 120/74 | HR 64 | Temp 97.9°F | Resp 22 | Ht 68.0 in | Wt 298.2 lb

## 2019-05-02 DIAGNOSIS — M25552 Pain in left hip: Secondary | ICD-10-CM

## 2019-05-02 DIAGNOSIS — M48061 Spinal stenosis, lumbar region without neurogenic claudication: Secondary | ICD-10-CM

## 2019-05-02 DIAGNOSIS — M79641 Pain in right hand: Secondary | ICD-10-CM

## 2019-05-02 DIAGNOSIS — M25551 Pain in right hip: Secondary | ICD-10-CM | POA: Diagnosis not present

## 2019-05-02 DIAGNOSIS — M1990 Unspecified osteoarthritis, unspecified site: Secondary | ICD-10-CM | POA: Insufficient documentation

## 2019-05-02 DIAGNOSIS — M79642 Pain in left hand: Secondary | ICD-10-CM

## 2019-05-02 MED ORDER — OXYCODONE-ACETAMINOPHEN 5-325 MG PO TABS: 1.0000 | ORAL_TABLET | Freq: Two times a day (BID) | ORAL | 0 refills | Status: DC | PRN

## 2019-05-02 NOTE — Patient Instructions (Addendum)
Eliminate foods that cause inflammation- processed foods, starches.  Increase fats- olive oil, avocado (oil), ghee, grass fed butter, coconut oil.  Look online at anti-inflammatory diet  If no improvement with PT, will need to go back to spinal surgeon  Follow up in 6 months for labs/ physical

## 2019-05-02 NOTE — Progress Notes (Signed)
Subjective:    Patient ID: Jonathon Graham, male    DOB: [birth year], 63 y.o.   MRN: [id number]  HPI Chief Complaint  Patient presents with  . Hip Pain    left hip. Oxycodone and Ibuprofen 800 mg not helping the pain.   This is a 63 yo male who presents today with continued left hip pain. Pain in left side of back. Difficulty getting up in the mornings, has to stand for awhile, stiff, limping. Also occurs after any activity.  Had MRI lumbar spine 9/19- L4-5 moderate stenosis due to 4 mm slip, advanced posterior element hypertrophy as well as uncovering of the disc. Bilateral L4-L5 neural impingement are likely. Central protrusion L5-S1.  Gets 3-4 hours relief with oxycodone, a couple of hours of relief with ibuprofen 800 mg bid. He is starting PT next week, has been difficult for him to get an appointment.   Past Medical History:  Diagnosis Date  . Arthritis   . Benign prostatic hyperplasia (BPH) with urinary urgency    nodular  . COPD (chronic obstructive pulmonary disease) (HCC)   . Depression   . Diabetes mellitus without complication (HCC)   . Essential hypertension   . Fibromyalgia, primary   . Hyperlipidemia   . Hypertension   . Obesity    No past surgical history on file. Family History  Problem Relation Age of Onset  . Heart disease Father   . Diabetes Mellitus II Sister   . Myasthenia gravis Sister   . Fibrocystic breast disease Sister   . Hypertension Sister   . Diabetes Mellitus II Brother    Social History   Tobacco Use  . Smoking status: Former Smoker    Types: Cigarettes    Quit date: 03/28/2017    Years since quitting: 2.0  . Smokeless tobacco: Never Used  . Tobacco comment: 3-4 daily  Substance Use Topics  . Alcohol use: No    Alcohol/week: 0.0 standard drinks  . Drug use: No      Review of Systems Per HPI    Objective:   Physical Exam Vitals signs reviewed.  Constitutional:      General: He is not in acute distress.    Appearance: Normal  appearance. He is obese. He is not ill-appearing, toxic-appearing or diaphoretic.  HENT:     Head: Normocephalic and atraumatic.  Eyes:     Conjunctiva/sclera: Conjunctivae normal.  Neck:     Musculoskeletal: Normal range of motion and neck supple.  Cardiovascular:     Rate and Rhythm: Normal rate.  Pulmonary:     Effort: Pulmonary effort is normal.  Musculoskeletal:     Comments: Slightly stiff rising from chair. Lumbar spine without point tenderness. Left paraspinal tenderness. LE strength 5/5.   Skin:    General: Skin is warm and dry.  Neurological:     Mental Status: He is alert and oriented to person, place, and time.       BP 120/74   Pulse 64   Temp 97.9 F (36.6 C)   Resp (!) 22   Ht 5\' 8"  (1.727 m)   Wt 298 lb 4 oz (135.3 kg)   SpO2 95%   BMI 45.35 kg/m  Wt Readings from Last 3 Encounters:  05/02/19 298 lb 4 oz (135.3 kg)  03/07/19 296 lb 1.9 oz (134.3 kg)  12/30/18 (!) 302 lb (137 kg)       Assessment & Plan:  1. Bilateral hand pain - he has been wearing his  braces with some relief - has follow up with hand  2. Right hip pain - improved, no with pain on left  3. Spinal stenosis of lumbar region, unspecified whether neurogenic claudication present - reviewed last imaging with patient. Discussed finding and possible need for surgical intervention in the future. If no improvement, will need to return to ortho/spine - reviewed [facility]- last fill of pain med #10 was two months ago. - oxyCODONE-acetaminophen (PERCOCET/ROXICET) 5-325 MG tablet; Take 1 tablet by mouth 2 (two) times daily as needed for severe pain.  Dispense: 10 tablet; Refill: 0  4. Left hip pain - per #3, discussed radiation of pain from lumbar disease - oxyCODONE-acetaminophen (PERCOCET/ROXICET) 5-325 MG tablet; Take 1 tablet by mouth 2 (two) times daily as needed for severe pain.  Dispense: 10 tablet; Refill: 0  This visit occurred during the SARS-CoV-2 public health emergency.  Safety  protocols were in place, including screening questions prior to the visit, additional usage of staff PPE, and extensive cleaning of exam room while observing appropriate contact time as indicated for disinfecting solutions.    Clarene Reamer, FNP-BC  [facility] at [facility], [facility]  05/02/2019 11:35 PM

## 2019-05-09 ENCOUNTER — Ambulatory Visit: Payer: Medicaid Other | Admitting: Pulmonary Disease

## 2019-05-09 ENCOUNTER — Other Ambulatory Visit: Payer: Self-pay

## 2019-05-09 ENCOUNTER — Encounter: Payer: Self-pay | Admitting: Pulmonary Disease

## 2019-05-09 VITALS — BP 124/70 | HR 91 | Temp 97.6°F | Ht 68.0 in | Wt 297.0 lb

## 2019-05-09 DIAGNOSIS — J9612 Chronic respiratory failure with hypercapnia: Secondary | ICD-10-CM

## 2019-05-09 DIAGNOSIS — G4733 Obstructive sleep apnea (adult) (pediatric): Secondary | ICD-10-CM | POA: Diagnosis not present

## 2019-05-09 DIAGNOSIS — J9611 Chronic respiratory failure with hypoxia: Secondary | ICD-10-CM

## 2019-05-09 DIAGNOSIS — E662 Morbid (severe) obesity with alveolar hypoventilation: Secondary | ICD-10-CM | POA: Diagnosis not present

## 2019-05-09 DIAGNOSIS — R0602 Shortness of breath: Secondary | ICD-10-CM

## 2019-05-09 MED ORDER — ALBUTEROL SULFATE (2.5 MG/3ML) 0.083% IN NEBU: 2.5000 mg | INHALATION_SOLUTION | Freq: Four times a day (QID) | RESPIRATORY_TRACT | 6 refills | Status: AC | PRN

## 2019-05-09 MED ORDER — ALBUTEROL SULFATE (2.5 MG/3ML) 0.083% IN NEBU: 2.5000 mg | INHALATION_SOLUTION | Freq: Four times a day (QID) | RESPIRATORY_TRACT | 6 refills | Status: DC | PRN

## 2019-05-09 MED ORDER — FLUTICASONE-SALMETEROL 250-50 MCG/DOSE IN AEPB: 1.0000 | INHALATION_SPRAY | Freq: Two times a day (BID) | RESPIRATORY_TRACT | 3 refills | Status: DC

## 2019-05-09 NOTE — Progress Notes (Signed)
[facility]  Chief Complaint  Patient presents with  . Follow-up    Constitutional:  BP 124/70 (BP Location: Left Arm, Cuff Size: Large)   Pulse 91   Temp 97.6 F (36.4 C) (Oral)   Ht '5\' 8"'  (1.727 m)   Wt 297 lb (134.7 kg)   SpO2 98%   BMI 45.16 kg/m   Past Medical History:  BPH, Depression, DM, HTN, Fibromyalgia, HLD  Brief Summary:  Jonathon Graham is a 63 y.o. male former smoker with obstructive sleep apnea, obesity hypoventilation syndrome, and asthmatic bronchitis.  Previously seen by Dr. Ashby Dawes.  He has two PAP machines.  Using old machine because he had trouble from mask with new machine.  Was told by his DME that he would have to pay $200 for a new mask even though he was due for a mask anyway.  Using 3 liters oxygen 24/7.  Has pulse ox at home an levels always above 90% when he uses his oxygen.  Can drop into 80's when he exerts himself w/o oxygen, but then he feels a difference and puts oxygen on.  Not having cough, wheeze, sputum, chest pain, or hemoptysis.  Using advair bid.  Not needing albuterol much recently.  Gets ankle swelling.   Changed his diet.  Has lost 40 lbs over the past two years.  Has lower back pain and neuropathy.  Might need back surgery, but delayed due to [place].  Physical Exam:   Appearance - well kempt, wearing oxygen  ENMT - clear nasal mucosa, midline nasal  septum, no oral exudates, no LAN, trachea midline  Respiratory - normal chest wall, normal respiratory effort, no accessory muscle use, no wheeze/rales  CV - s1s2 regular rate and rhythm, no murmurs, no peripheral edema, radial pulses symmetric  GI - soft, non tender, no masses  Lymph - no adenopathy noted in neck and axillary areas  MSK - normal gait  Ext - no cyanosis, clubbing, or joint inflammation noted  Skin - no rashes, lesions, or ulcers  Neuro - normal strength, oriented x 3  Psych - normal mood and affect    Assessment/Plan:   Obstructive sleep apnea. - he reports benefit from therapy and compliance with therapy - he is supposed to be using VPAP auto with max IPAP 25, min EPAP 16, PS 5 cm H2O - wasn't able to get proper fitting mask for VPAP before from DME - will send order for new mask and have him resume VPAP in place of his old CPAP which was set at 16 cm H2O  Chronic respiratory failure with hypoxia and hypercapnia from obesity hypoventilation syndrome. - encouraged him to keep up with diet modification - exercise capacity limited at this time due to back pain and neuropathy - continue 3 liters oxygen 24/7, including at night with Bipap - goal SpO2 > 90%  Asthmatic bronchitis. - continue advair with prn albuterol - discussed role for his different inhalers and when to use each   Patient Instructions  Will arrange for replacement Bipap mask  Follow up in 6 months in [facility]   A total of  29 minutes were spent face to face with the patient and more than half of that time involved counseling or coordination of care.  Chesley Mires, MD [facility] Pager: [phone] 05/09/2019, 10:20 AM  Flow Sheet     Pulmonary tests:  ABG 02/01/17 >> pH 7.39, PCO2 53, PO2 52 PFT 12/20/17 >> FEV1 2.22 (  68%), FEV1% 79, TLC 4.81 (78%), DLCO 54%  Chest imaging:  CT angio chest 03/24/17 >> CM, ascending aorta 4.5 cm, mild dilation PA, b/l upper lobe GGO likely infectious  Sleep tests:  PSG 04/25/13 >> AHI 101.4, SpO2 low 57.4% PSG 07/24/17 >> AHI 139.7, SpO2 low 55.4%  Cardiac tests:  Echo 11/20/16 >> EF 55 to 60%, mod RV dilation  Medications:   Allergies as of 05/09/2019      Reactions   Penicillins Anaphylaxis, Swelling      Medication List       Accurate as of May 09, 2019 10:20 AM. If you have any questions, ask your nurse or doctor.        acetaminophen 500 MG tablet Commonly known as: TYLENOL Take 1 tablet (500 mg total) by mouth every  8 (eight) hours as needed.   albuterol 108 (90 Base) MCG/ACT inhaler Commonly known as: ProAir HFA Inhale 1-2 puffs into the lungs every 4 (four) hours as needed for wheezing or shortness of breath.   albuterol (2.5 MG/3ML) 0.083% nebulizer solution Commonly known as: PROVENTIL Take 3 mLs (2.5 mg total) by nebulization every 6 (six) hours as needed for wheezing or shortness of breath.   aspirin 81 MG tablet Take 243 mg by mouth daily. Takes 4 53m aspirin daily.   atorvastatin 20 MG tablet Commonly known as: LIPITOR Take 1 tablet (20 mg total) by mouth daily.   betamethasone valerate ointment 0.1 % Commonly known as: VALISONE Apply 1 application topically 2 (two) times daily. For up to 10 days.   blood glucose meter kit and supplies Kit Dispense based on patient and insurance preference. Use up to four times daily as directed. (FOR ICD-9 250.00, 250.01).   clotrimazole 1 % cream Commonly known as: LOTRIMIN Apply 1 application 2 (two) times daily topically. For 7 days   diclofenac 75 MG EC tablet Commonly known as: VOLTAREN Take 1 tablet (75 mg total) by mouth 2 (two) times daily as needed.   DULoxetine 60 MG capsule Commonly known as: Cymbalta Take 1 capsule (60 mg total) by mouth daily.   Fluticasone-Salmeterol 250-50 MCG/DOSE Aepb Commonly known as: Advair Diskus Inhale 1 puff into the lungs 2 (two) times daily.   furosemide 40 MG tablet Commonly known as: LASIX Take 1 tablet by mouth once daily   gabapentin 300 MG capsule Commonly known as: NEURONTIN Take 1 capsule (300 mg total) by mouth 3 (three) times daily.   hydrochlorothiazide 12.5 MG capsule Commonly known as: MICROZIDE Take 1 capsule by mouth once daily   ibuprofen 800 MG tablet Commonly known as: ADVIL TAKE 1 TABLET BY MOUTH EVERY 8 HOURS AS NEEDED   Medical Compression Socks Misc 1 each by Does not apply route daily.   metFORMIN 1000 MG tablet Commonly known as: GLUCOPHAGE TAKE 1 TABLET BY  MOUTH TWICE DAILY WITH A MEAL   oxyCODONE-acetaminophen 5-325 MG tablet Commonly known as: PERCOCET/ROXICET Take 1 tablet by mouth 2 (two) times daily as needed for severe pain.   tamsulosin 0.4 MG Caps capsule Commonly known as: FLOMAX Take 1 capsule by mouth once daily   Wrist Brace Misc 2 Units by Does not apply route. Wear on wrists as needed for support       Past Surgical History:  He  has no past surgical history on file.  Family History:  His family history includes Diabetes Mellitus II in his brother and sister; Fibrocystic breast disease in his sister; Heart disease in his father;  Hypertension in his sister; Myasthenia gravis in his sister.  Social History:  He  reports that he quit smoking about 2 years ago. His smoking use included cigarettes. He has never used smokeless tobacco. He reports that he does not drink alcohol or use drugs.

## 2019-05-09 NOTE — Patient Instructions (Signed)
Will arrange for replacement Bipap mask  Follow up in 6 months in [facility]

## 2019-05-26 ENCOUNTER — Other Ambulatory Visit: Payer: Self-pay | Admitting: Family Medicine

## 2019-05-26 DIAGNOSIS — E119 Type 2 diabetes mellitus without complications: Secondary | ICD-10-CM

## 2019-06-05 ENCOUNTER — Ambulatory Visit (INDEPENDENT_AMBULATORY_CARE_PROVIDER_SITE_OTHER): Payer: Medicaid Other | Admitting: Psychology

## 2019-06-05 DIAGNOSIS — F3289 Other specified depressive episodes: Secondary | ICD-10-CM

## 2019-06-13 ENCOUNTER — Other Ambulatory Visit: Payer: Self-pay | Admitting: Family Medicine

## 2019-06-13 ENCOUNTER — Ambulatory Visit: Payer: Self-pay | Admitting: Psychology

## 2019-06-13 DIAGNOSIS — N401 Enlarged prostate with lower urinary tract symptoms: Secondary | ICD-10-CM

## 2019-06-17 ENCOUNTER — Telehealth: Payer: Self-pay | Admitting: Family Medicine

## 2019-06-17 ENCOUNTER — Other Ambulatory Visit: Payer: Self-pay | Admitting: Family Medicine

## 2019-06-17 DIAGNOSIS — G8929 Other chronic pain: Secondary | ICD-10-CM

## 2019-06-17 DIAGNOSIS — M25551 Pain in right hip: Secondary | ICD-10-CM

## 2019-06-17 NOTE — Telephone Encounter (Signed)
Please call patient and let him know that I have put in the referral.

## 2019-06-17 NOTE — Telephone Encounter (Signed)
Patient is requesting a new referral for physical Therapy  He stated he had a referral sent over in dec. The office there ended up have covid cases in there office and had to close. So the patient stated they advised him that the referral had expired and would need a new one so they can schedule him    [facility] PHYSICAL THERAPY @ [facility]

## 2019-06-18 NOTE — Telephone Encounter (Signed)
Spoke with pt about message. Pt understood and had no further questions. Nothing further is needed.

## 2019-06-24 ENCOUNTER — Ambulatory Visit (INDEPENDENT_AMBULATORY_CARE_PROVIDER_SITE_OTHER): Payer: Medicaid Other | Admitting: Psychology

## 2019-06-24 DIAGNOSIS — F3289 Other specified depressive episodes: Secondary | ICD-10-CM

## 2019-07-02 ENCOUNTER — Ambulatory Visit: Payer: Medicaid Other | Admitting: Psychology

## 2019-07-13 ENCOUNTER — Other Ambulatory Visit: Payer: Self-pay | Admitting: Family Medicine

## 2019-07-14 ENCOUNTER — Other Ambulatory Visit: Payer: Self-pay

## 2019-07-14 DIAGNOSIS — M25552 Pain in left hip: Secondary | ICD-10-CM

## 2019-07-14 DIAGNOSIS — M48061 Spinal stenosis, lumbar region without neurogenic claudication: Secondary | ICD-10-CM

## 2019-07-14 MED ORDER — OXYCODONE-ACETAMINOPHEN 5-325 MG PO TABS: 1.0000 | ORAL_TABLET | Freq: Two times a day (BID) | ORAL | 0 refills | Status: DC | PRN

## 2019-07-14 NOTE — Telephone Encounter (Signed)
Patient is requesting a refill on Oxycodone for #10 with 0 refills. This was last refilled on 05/02/19. Patient was last seen on 05/02/19 and has no upcoming appts. Is this ok to refill?

## 2019-07-15 ENCOUNTER — Ambulatory Visit (INDEPENDENT_AMBULATORY_CARE_PROVIDER_SITE_OTHER): Payer: Medicaid Other | Admitting: Psychology

## 2019-07-15 DIAGNOSIS — F3289 Other specified depressive episodes: Secondary | ICD-10-CM | POA: Diagnosis not present

## 2019-07-24 ENCOUNTER — Ambulatory Visit (INDEPENDENT_AMBULATORY_CARE_PROVIDER_SITE_OTHER): Payer: Medicaid Other | Admitting: Psychology

## 2019-07-24 DIAGNOSIS — F3289 Other specified depressive episodes: Secondary | ICD-10-CM | POA: Diagnosis not present

## 2019-07-27 ENCOUNTER — Other Ambulatory Visit: Payer: Self-pay | Admitting: Family Medicine

## 2019-07-27 DIAGNOSIS — G8929 Other chronic pain: Secondary | ICD-10-CM

## 2019-07-29 ENCOUNTER — Ambulatory Visit (INDEPENDENT_AMBULATORY_CARE_PROVIDER_SITE_OTHER): Payer: Medicaid Other | Admitting: Psychology

## 2019-07-29 DIAGNOSIS — F3289 Other specified depressive episodes: Secondary | ICD-10-CM

## 2019-07-29 NOTE — Telephone Encounter (Signed)
Last OV 03/07/2019 Last refill 02/17/2019 #60 x 1 refill.   Please advise, thanks.

## 2019-08-04 ENCOUNTER — Ambulatory Visit: Payer: Medicaid Other | Admitting: Psychology

## 2019-08-12 ENCOUNTER — Ambulatory Visit (INDEPENDENT_AMBULATORY_CARE_PROVIDER_SITE_OTHER): Payer: Medicaid Other | Admitting: Psychology

## 2019-08-12 DIAGNOSIS — F3289 Other specified depressive episodes: Secondary | ICD-10-CM | POA: Diagnosis not present

## 2019-08-27 ENCOUNTER — Other Ambulatory Visit: Payer: Self-pay | Admitting: Family Medicine

## 2019-08-27 DIAGNOSIS — E119 Type 2 diabetes mellitus without complications: Secondary | ICD-10-CM

## 2019-08-27 DIAGNOSIS — I10 Essential (primary) hypertension: Secondary | ICD-10-CM

## 2019-08-27 NOTE — Telephone Encounter (Signed)
Furosemide last refilled 02/17/19 for #90 with 1 refill.  Metformin last refilled 05/27/19 for #180 with 0 refills.  Patient was last seen in the office on 05/02/19 and has no upcoming appts. Are these medications ok to refill?

## 2019-08-28 ENCOUNTER — Ambulatory Visit: Payer: Medicaid Other | Admitting: Adult Health

## 2019-08-29 ENCOUNTER — Ambulatory Visit: Payer: Medicaid Other | Admitting: Psychology

## 2019-09-03 ENCOUNTER — Telehealth: Payer: Self-pay | Admitting: Family Medicine

## 2019-09-03 NOTE — Telephone Encounter (Signed)
I checked pt's insurance and he has Medicaid [employer].  He has applied for Disability and he should be eligible for [facility] in a month or two.  He will c/b as soon as he receives his MCR.  Pt would like referral deferred.  You can put in notes pt wants [facility].  [facility]cd [facility]

## 2019-09-03 NOTE — Telephone Encounter (Signed)
Where should location for referral be?

## 2019-09-03 NOTE — Telephone Encounter (Signed)
Please put in new referral and I will call pt for him to call to schedule. Spoke w/pt.  Must be a communication problem.  Referral notes [facility]. And pt was waiting for them to c/b d/t ?rehab person w/covid.  Not sure why after pt went to 1st visit add'l visits weren't scheduled

## 2019-09-03 NOTE — Telephone Encounter (Signed)
Patient's wife was in today and said that patient has not heard about referral. I am not sure if she means PT? It looks like he was seen 07/15/19 so I am not sure what he is referring to. Please call patient to clarify.

## 2019-09-05 ENCOUNTER — Ambulatory Visit (INDEPENDENT_AMBULATORY_CARE_PROVIDER_SITE_OTHER): Payer: Medicare Other | Admitting: Adult Health

## 2019-09-05 ENCOUNTER — Other Ambulatory Visit: Payer: Self-pay

## 2019-09-05 ENCOUNTER — Encounter: Payer: Self-pay | Admitting: Adult Health

## 2019-09-05 DIAGNOSIS — J4540 Moderate persistent asthma, uncomplicated: Secondary | ICD-10-CM

## 2019-09-05 DIAGNOSIS — E662 Morbid (severe) obesity with alveolar hypoventilation: Secondary | ICD-10-CM

## 2019-09-05 DIAGNOSIS — G4733 Obstructive sleep apnea (adult) (pediatric): Secondary | ICD-10-CM | POA: Diagnosis not present

## 2019-09-05 DIAGNOSIS — J9612 Chronic respiratory failure with hypercapnia: Secondary | ICD-10-CM

## 2019-09-05 DIAGNOSIS — J9611 Chronic respiratory failure with hypoxia: Secondary | ICD-10-CM

## 2019-09-05 NOTE — Patient Instructions (Addendum)
Continue on BiPAP at bedtime with oxygen Very important to wear every night all night and with naps.  You have very severe sleep apnea and is dangerous for you not to wear your BIPAP and it is dangerous to take pain medications or other sedating medications when you are not wearing your BIPAP to sleep.  BIPAP download in 6 weeks .  Continue on oxygen 3 L Continue on Advair 1 puff twice daily, rinse after use Albuterol as needed Activity as tolerated Work on healthy weight Do not drive if sleepy Follow-up in 4 months with Dr. Craige Cotta and As needed  - Chest xray on return .  Please contact office for sooner follow up if symptoms do not improve or worsen or seek emergency care

## 2019-09-05 NOTE — Telephone Encounter (Signed)
Noted  

## 2019-09-05 NOTE — Progress Notes (Signed)
Virtual Visit via Telephone Note  I connected with Jonathon Graham on 09/05/19 at  9:30 AM EDT by telephone and verified that I am speaking with the correct person using two identifiers.  Location: Patient: Home  Provider: Office    I discussed the limitations, risks, security and privacy concerns of performing an evaluation and management service by telephone and the availability of in person appointments. I also discussed with the patient that there may be a patient responsible charge related to this service. The patient expressed understanding and agreed to proceed.   History of Present Illness: 64 year old male former smoker followed for obstructive sleep apnea, OHS, chronic respiratory failure on oxygen, asthmatic bronchitis   Today's televisit is a 26-monthfollow-up for OSA/OHS, oxygen dependent respiratory failure and asthmatic bronchitis. Patient says since last visit he is doing well. He is on nocturnal BiPAP with oxygen. She admits he has not been wearing his BiPAP.  Says that he just did not feel comfortable with it at times.  Also would go to sleep and forget to put it on.  He has very severe sleep apnea with previous sleep study showing greater than 139 events an hour with associated severe hypoxemia episodes along with OHS/chronic hypercarbia.  He also is taking chronic pain medications with Percocet.  Long discussion regarding sedating medications and potential complications with untreated sleep apnea/OHS.  Advised of the dangers combination.  Patient education on helpful hints for BiPAP and importance of usage.   Remains on oxygen 3 L 24/7. O2 sats at home on O2 94-96%.   He has asthmatic bronchitis is on Advair twice daily. No increased albuterol use.  Says he has had good breathing.  No flare of cough or wheezing.  Patient says he is trying to work on healthy weight loss.     Patient Active Problem List   Diagnosis Date Noted  . Bilateral hand pain 05/02/2019  .  Depression, major, single episode, severe ([facility] 08/23/2018  . Acute respiratory failure ([facility] 03/24/2017  . COPD exacerbation ([facility] 03/15/2017  . Obesity hypoventilation syndrome ([place] 03/14/2017  . Right shoulder pain 11/29/2016  . Arthritis 03/14/2016  . BPH (benign prostatic hyperplasia) 03/14/2016  . Depression 03/14/2016  . HTN (hypertension) 03/14/2016  . Fibromyalgia 03/14/2016  . HLD (hyperlipidemia) 03/14/2016  . Insomnia 03/14/2016  . COPD (chronic obstructive pulmonary disease) ([facility] 11/26/2015  . Severe obstructive sleep apnea 06/15/2015  . Tobacco use 06/15/2015  . BMI 50.0-59.9, adult ([facility] 05/26/2015  . Type 2 diabetes, controlled, with peripheral neuropathy ([facility] 05/04/2015   Current Outpatient Medications on File Prior to Visit  Medication Sig Dispense Refill  . acetaminophen (TYLENOL) 500 MG tablet Take 1 tablet (500 mg total) by mouth every 8 (eight) hours as needed. 30 tablet 0  . albuterol (PROAIR HFA) 108 (90 Base) MCG/ACT inhaler Inhale 1-2 puffs into the lungs every 4 (four) hours as needed for wheezing or shortness of breath. 1 Inhaler 5  . albuterol (PROVENTIL) (2.5 MG/3ML) 0.083% nebulizer solution Take 3 mLs (2.5 mg total) by nebulization every 6 (six) hours as needed for wheezing or shortness of breath. 360 mL 6  . aspirin 81 MG tablet Take 243 mg by mouth daily. Takes 4 836maspirin daily.    . Marland Kitchentorvastatin (LIPITOR) 20 MG tablet Take 1 tablet (20 mg total) by mouth daily. 90 tablet 3  . betamethasone valerate ointment (VALISONE) 0.1 % Apply 1 application topically 2 (two) times daily. For up to 10 days. 30 g 0  .  blood glucose meter kit and supplies KIT Dispense based on patient and insurance preference. Use up to four times daily as directed. (FOR ICD-9 250.00, 250.01). 1 each 0  . clotrimazole (LOTRIMIN) 1 % cream Apply 1 application 2 (two) times daily topically. For 7 days 28 g 0  . diclofenac (VOLTAREN) 75 MG EC tablet Take 1 tablet (75 mg total) by  mouth 2 (two) times daily as needed. 60 tablet 1  . DULoxetine (CYMBALTA) 60 MG capsule Take 1 capsule (60 mg total) by mouth daily. 90 capsule 1  . Elastic Bandages & Supports (MEDICAL COMPRESSION SOCKS) MISC 1 each by Does not apply route daily. 1 each 0  . Fluticasone-Salmeterol (ADVAIR DISKUS) 250-50 MCG/DOSE AEPB Inhale 1 puff into the lungs 2 (two) times daily. 180 each 3  . furosemide (LASIX) 40 MG tablet Take 1 tablet by mouth once daily 90 tablet 0  . gabapentin (NEURONTIN) 300 MG capsule Take 1 capsule (300 mg total) by mouth 3 (three) times daily. 270 capsule 1  . hydrochlorothiazide (MICROZIDE) 12.5 MG capsule Take 1 capsule by mouth once daily 90 capsule 1  . ibuprofen (ADVIL) 800 MG tablet TAKE 1 TABLET BY MOUTH EVERY 8 HOURS AS NEEDED 60 tablet 0  . metFORMIN (GLUCOPHAGE) 1000 MG tablet TAKE 1 TABLET BY MOUTH TWICE DAILY WITH A MEAL 180 tablet 0  . Misc. Devices (WRIST BRACE) MISC 2 Units by Does not apply route. Wear on wrists as needed for support    . oxyCODONE-acetaminophen (PERCOCET/ROXICET) 5-325 MG tablet Take 1 tablet by mouth 2 (two) times daily as needed for severe pain. 10 tablet 0  . tamsulosin (FLOMAX) 0.4 MG CAPS capsule Take 1 capsule by mouth once daily 90 capsule 0   Current Facility-Administered Medications on File Prior to Visit  Medication Dose Route Frequency Provider Last Rate Last Admin  . ipratropium-albuterol (DUONEB) 0.5-2.5 (3) MG/3ML nebulizer solution 3 mL  3 mL Nebulization Q6H Krebs, Amy Lauren, NP          Observations/Objective: ABG 02/01/17 >> pH 7.39, PCO2 53, PO2 52 PFT 12/20/17 >> FEV1 2.22 (68%), FEV1% 79, TLC 4.81 (78%), DLCO 54%  Chest imaging:  CT angio chest 03/24/17 >> CM, ascending aorta 4.5 cm, mild dilation PA, b/l upper lobe GGO likely infectious  Sleep tests:  PSG 04/25/13 >> AHI 101.4, SpO2 low 57.4% PSG 07/24/17 >> AHI 139.7, SpO2 low 55.4%  Cardiac tests:  Echo 11/20/16 >> EF 55 to 60%, mod RV dilation    Assessment  and Plan: OSA/OHS-uncontrolled with noncompliance.  Patient education on potential complications of untreated sleep apnea and OHS.  Also discussed dangers combination of narcotic pain medications and sedating medications with untreated sleep apnea. Patient education on helpful hints to utilize BiPAP more effectively.  Healthy sleep hygiene regimen discussed.  Oxygen dependent respiratory failure-tinea on oxygen 3 L to keep O2 saturations greater than 88 to 90%.  Continue on oxygen 3 L with BiPAP.  Asthmatic bronchitis-under good control on present regimen. Chest x-ray on return  Plan  Patient Instructions  Continue on BiPAP at bedtime with oxygen Very important to wear every night all night and with naps.  You have very severe sleep apnea and is dangerous for you not to wear your BIPAP and it is dangerous to take pain medications or other sedating medications when you are not wearing your BIPAP to sleep.  BIPAP download in 6 weeks .  Continue on oxygen 3 L Continue on Advair 1 puff twice  daily, rinse after use Albuterol as needed Activity as tolerated Work on healthy weight Do not drive if sleepy Follow-up in 4 months with Dr. Halford Chessman and As needed  - Chest xray on return .  Please contact office for sooner follow up if symptoms do not improve or worsen or seek emergency care        Follow Up Instructions: BiPAP download in 6 weeks Follow-up in 4 months with Dr. Halford Chessman and as needed   I discussed the assessment and treatment plan with the patient. The patient was provided an opportunity to ask questions and all were answered. The patient agreed with the plan and demonstrated an understanding of the instructions.   The patient was advised to call back or seek an in-person evaluation if the symptoms worsen or if the condition fails to improve as anticipated.  I provided  24 minutes of non-face-to-face time during this encounter.   Rexene Edison, NP

## 2019-09-17 ENCOUNTER — Other Ambulatory Visit: Payer: Self-pay

## 2019-09-17 ENCOUNTER — Ambulatory Visit (INDEPENDENT_AMBULATORY_CARE_PROVIDER_SITE_OTHER): Payer: Medicaid Other | Admitting: Family Medicine

## 2019-09-17 ENCOUNTER — Encounter: Payer: Self-pay | Admitting: Family Medicine

## 2019-09-17 VITALS — BP 118/68 | HR 88 | Temp 97.8°F | Ht 68.0 in | Wt 309.0 lb

## 2019-09-17 DIAGNOSIS — E1142 Type 2 diabetes mellitus with diabetic polyneuropathy: Secondary | ICD-10-CM | POA: Diagnosis not present

## 2019-09-17 DIAGNOSIS — K529 Noninfective gastroenteritis and colitis, unspecified: Secondary | ICD-10-CM

## 2019-09-17 DIAGNOSIS — Z6841 Body Mass Index (BMI) 40.0 and over, adult: Secondary | ICD-10-CM

## 2019-09-17 LAB — COMPREHENSIVE METABOLIC PANEL
ALT: 18 U/L (ref 0–53)
AST: 15 U/L (ref 0–37)
Albumin: 4 g/dL (ref 3.5–5.2)
Alkaline Phosphatase: 47 U/L (ref 39–117)
BUN: 22 mg/dL (ref 6–23)
CO2: 31 mEq/L (ref 19–32)
Calcium: 9.1 mg/dL (ref 8.4–10.5)
Chloride: 101 mEq/L (ref 96–112)
Creatinine, Ser: 0.87 mg/dL (ref 0.40–1.50)
GFR: 107.82 mL/min (ref >60.00)
Glucose, Bld: 200 mg/dL — ABNORMAL HIGH (ref 70–99)
Potassium: 3.4 mEq/L — ABNORMAL LOW (ref 3.5–5.1)
Sodium: 139 mEq/L (ref 135–145)
Total Bilirubin: 0.5 mg/dL (ref 0.2–1.2)
Total Protein: 7 g/dL (ref 6.0–8.3)

## 2019-09-17 LAB — LIPID PANEL
Cholesterol: 97 mg/dL (ref 0–200)
HDL: 31.9 mg/dL — ABNORMAL LOW (ref >39.00)
LDL Cholesterol: 49 mg/dL (ref 0–99)
NonHDL: 64.62
Total CHOL/HDL Ratio: 3
Triglycerides: 79 mg/dL (ref 0.0–149.0)
VLDL: 15.8 mg/dL (ref 0.0–40.0)

## 2019-09-17 LAB — HEMOGLOBIN A1C: Hgb A1c MFr Bld: 6.8 % — ABNORMAL HIGH (ref 4.6–6.5)

## 2019-09-17 LAB — VITAMIN D 25 HYDROXY (VIT D DEFICIENCY, FRACTURES): VITD: 11.99 ng/mL — ABNORMAL LOW (ref 30.00–100.00)

## 2019-09-17 MED ORDER — ONDANSETRON HCL 8 MG PO TABS: 8.0000 mg | ORAL_TABLET | Freq: Three times a day (TID) | ORAL | 0 refills | Status: DC | PRN

## 2019-09-17 NOTE — Progress Notes (Addendum)
   Subjective:    Patient ID: Jonathon Graham, male    DOB: 1955-08-07, 64 y.o.   MRN: [id number]  HPI Nausea x 7 days, had 2 episodes of vomiting, none recently. Had some diarrhea, none for several days. Stomach feels queasy, wife with similar symptoms. Has been eating bland foods. No heartburn. No fever.   Increased stress. His younger sister with recent stroke and uterine cancer. Has not been making good food choices.   Continues follow up with pulmonary for COPD, OSA, using CPAP every night.    Review of Systems Per HPI    Objective:   Physical Exam Vitals reviewed.  Constitutional:      General: He is not in acute distress.    Appearance: Normal appearance. He is obese. He is not ill-appearing, toxic-appearing or diaphoretic.  Eyes:     Conjunctiva/sclera: Conjunctivae normal.  Cardiovascular:     Rate and Rhythm: Normal rate and regular rhythm.     Heart sounds: Normal heart sounds.  Pulmonary:     Effort: Pulmonary effort is normal.     Breath sounds: Normal breath sounds.  Abdominal:     General: There is no distension.     Palpations: Abdomen is soft.     Tenderness: There is abdominal tenderness (mild, generalized). There is no guarding or rebound.  Neurological:     Mental Status: He is alert.       BP 118/68 (BP Location: Left Arm, Patient Position: Sitting, Cuff Size: Normal)   Pulse 88   Temp 97.8 F (36.6 C) (Temporal)   Ht 5\' 8"  (1.727 m)   Wt (!) 309 lb (140.2 kg)   SpO2 96%   BMI 46.98 kg/m  Wt Readings from Last 3 Encounters:  09/17/19 (!) 309 lb (140.2 kg)  05/09/19 297 lb (134.7 kg)  05/02/19 298 lb 4 oz (135.3 kg)       Assessment & Plan:  1. Gastroenteritis -  Patient Instructions  Good to see you today  I think you have a stomach bug, I have sent in anti nausea medicine to your pharmacy  Continue bland diet    - RTC precautions reviewed - ondansetron (ZOFRAN) 8 MG tablet; Take 1 tablet (8 mg total) by mouth every 8 (eight)  hours as needed for nausea or vomiting.  Dispense: 12 tablet; Refill: 0 - Comprehensive metabolic panel - Hemoglobin A1c - Lipid Panel - Vitamin D, 25-hydroxy  2. Type 2 diabetes, controlled, with peripheral neuropathy (HCC) - Comprehensive metabolic panel - Hemoglobin A1c - Lipid Panel - Vitamin D, 25-hydroxy  3. Morbid obesity with BMI 45.0- 49.0 -has gained back some weight, encouraged him to watch portions, make good choices  This visit occurred during the SARS-CoV-2 public health emergency.  Safety protocols were in place, including screening questions prior to the visit, additional usage of staff PPE, and extensive cleaning of exam room while observing appropriate contact time as indicated for disinfecting solutions.      14/04/20, FNP-BC  [facility], [facility]  09/17/2019 11:32 AM

## 2019-09-17 NOTE — Patient Instructions (Addendum)
Good to see you today  I think you have a stomach bug, I have sent in anti nausea medicine to your pharmacy  Continue bland diet

## 2019-09-18 MED ORDER — VITAMIN D3 1.25 MG (50000 UT) PO TABS: 1.0000 | ORAL_TABLET | ORAL | 3 refills | Status: DC

## 2019-09-18 NOTE — Addendum Note (Signed)
Addended by: [name] B on: 09/18/2019 08:48 AM   Modules accepted: Orders

## 2019-09-19 ENCOUNTER — Telehealth: Payer: Self-pay | Admitting: Adult Health

## 2019-09-19 NOTE — Telephone Encounter (Signed)
Spoke with Marchelle Folks at [place], states that pt needs to be requalified for his oxygen ASAP.  States that his insurance is no longer paying for his O2, needs this retested in the next week if possible.    Pt needs to come in to office for a qualifying walk- had televisit with TP on 09/05/2019 so he is within the 30 day window of an appt and qualifying walk.    lmtcb for pt.    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

## 2019-09-21 ENCOUNTER — Other Ambulatory Visit: Payer: Self-pay | Admitting: Family Medicine

## 2019-09-21 DIAGNOSIS — N401 Enlarged prostate with lower urinary tract symptoms: Secondary | ICD-10-CM

## 2019-09-21 DIAGNOSIS — R351 Nocturia: Secondary | ICD-10-CM

## 2019-09-22 NOTE — Telephone Encounter (Signed)
LMTCB x2  

## 2019-09-22 NOTE — Telephone Encounter (Signed)
Rx was last refilled 06/16/19 for #90 with 0 refills. Patient was last seen on 09/17/19 and has no upcoming appts. Eunice Blase, is this ok?

## 2019-09-23 NOTE — Telephone Encounter (Signed)
Patient returned call. I have scheduled patient to come in the office to get a qualifying walk for 10/02/19 here in [place].

## 2019-09-23 NOTE — Telephone Encounter (Signed)
LMTCB x2  

## 2019-09-25 ENCOUNTER — Other Ambulatory Visit: Payer: Self-pay | Admitting: Family Medicine

## 2019-09-25 DIAGNOSIS — G8929 Other chronic pain: Secondary | ICD-10-CM

## 2019-09-25 DIAGNOSIS — M5442 Lumbago with sciatica, left side: Secondary | ICD-10-CM

## 2019-09-26 NOTE — Telephone Encounter (Signed)
Rx was last refilled on 07/30/19 for #60 with 0 refills. Patient was last seen on 09/17/19 - and has no upcoming appts. Eunice Blase, is this ok?

## 2019-10-02 ENCOUNTER — Ambulatory Visit (INDEPENDENT_AMBULATORY_CARE_PROVIDER_SITE_OTHER): Payer: Medicaid Other | Admitting: Pulmonary Disease

## 2019-10-02 ENCOUNTER — Ambulatory Visit: Payer: Medicaid Other

## 2019-10-02 DIAGNOSIS — J9601 Acute respiratory failure with hypoxia: Secondary | ICD-10-CM

## 2019-10-02 NOTE — Progress Notes (Signed)
Patient performed qualifying walk today.

## 2019-10-31 ENCOUNTER — Ambulatory Visit: Payer: Medicaid Other | Admitting: Family Medicine

## 2019-11-03 ENCOUNTER — Encounter: Payer: Self-pay | Admitting: Family Medicine

## 2019-11-03 ENCOUNTER — Other Ambulatory Visit: Payer: Self-pay

## 2019-11-03 ENCOUNTER — Ambulatory Visit (INDEPENDENT_AMBULATORY_CARE_PROVIDER_SITE_OTHER): Payer: Medicare Other | Admitting: Family Medicine

## 2019-11-03 VITALS — BP 110/70 | HR 67 | Temp 98.8°F | Ht 68.0 in | Wt 312.8 lb

## 2019-11-03 DIAGNOSIS — S8991XA Unspecified injury of right lower leg, initial encounter: Secondary | ICD-10-CM | POA: Diagnosis not present

## 2019-11-03 DIAGNOSIS — Z6841 Body Mass Index (BMI) 40.0 and over, adult: Secondary | ICD-10-CM

## 2019-11-03 DIAGNOSIS — Z7185 Encounter for immunization safety counseling: Secondary | ICD-10-CM

## 2019-11-03 DIAGNOSIS — Z7189 Other specified counseling: Secondary | ICD-10-CM

## 2019-11-03 NOTE — Progress Notes (Signed)
Subjective:    Patient ID: Jonathon Graham, male    DOB: 08/23/1955, 64 y.o.   MRN: [id number]  HPI Chief Complaint  Patient presents with  . shin pain    right shin pain, ran into truck hitch x 1 week ago. Swelling, bruising and redness. Severe pain 8/10   This is a 64 yo male who presents today for above cc. He bumped his right shin x 2 within a week.  Taking ibuprofen 800 mg twice a day. Some improvement with ice.   His sister passed away recently in [place]. She had a stroke. He is hoping to travel to [place].  He had visited earlier this spring with his pulmonologist, Dr. Craige Cotta.  He was advised to have pulmonary function testing prior to traveling out of the country.  He has not had his COVID-19 vaccine but plans to.   Review of Systems Per HPI    Objective:   Physical Exam Vitals reviewed.  Constitutional:      General: He is not in acute distress.    Appearance: Normal appearance. He is obese. He is not ill-appearing, toxic-appearing or diaphoretic.  HENT:     Head: Normocephalic and atraumatic.  Eyes:     Conjunctiva/sclera: Conjunctivae normal.  Cardiovascular:     Rate and Rhythm: Normal rate.     Pulses: Normal pulses.  Pulmonary:     Effort: Pulmonary effort is normal.  Skin:    General: Skin is warm and dry.     Comments: Right anterior shin with several areas of resolving ecchymosis.  Healing, small abrasions and scabbing.  No erythema or drainage.  Tender to palpation.  Normal knee and ankle range of motion.  No calf swelling or tenderness.  Neurological:     Mental Status: He is alert and oriented to person, place, and time.  Psychiatric:        Mood and Affect: Mood normal.        Behavior: Behavior normal.        Thought Content: Thought content normal.        Judgment: Judgment normal.       BP 110/70 (BP Location: Left Arm, Patient Position: Sitting, Cuff Size: Normal)   Pulse 67   Temp 98.8 F (37.1 C) (Temporal)   Ht 5\' 8"  (1.727 m)   Wt (!)  312 lb 12.8 oz (141.9 kg)   SpO2 93%   BMI 47.56 kg/m  Wt Readings from Last 3 Encounters:  11/03/19 (!) 312 lb 12.8 oz (141.9 kg)  09/17/19 (!) 309 lb (140.2 kg)  05/09/19 297 lb (134.7 kg)       Assessment & Plan:  1. Injury of right shin, initial encounter -No signs or symptoms of infection.  Suspect contusion which is I explained to the patient, will take a long time to heal.  He was instructed to notify me if he develops any signs symptoms of infection such as erythema, drainage, swelling -Discussed comfort measures including ice, compression  2. Class 3 severe obesity due to excess calories with serious comorbidity and body mass index (BMI) of 45.0 to 49.9 in adult [facility]) -His weight is creeping back up and he said that he is aware of this and has recently made changes in his diet to increase vegetables and lean proteins -Follow-up in October per previous schedule  3. Vaccine counseling -Discussed importance of him getting COVID-19 vaccine especially if he intends to travel to [place].  He understands that he is to  have follow-up with pulmonary prior to any overseas travel  This visit occurred during the SARS-CoV-2 public health emergency.  Safety protocols were in place, including screening questions prior to the visit, additional usage of staff PPE, and extensive cleaning of exam room while observing appropriate contact time as indicated for disinfecting solutions.    Clarene Reamer, FNP-BC  [facility] at [facility], [facility]  11/03/2019 12:03 PM

## 2019-11-03 NOTE — Patient Instructions (Signed)
Good to see you today  You have a contusion of your shin bone   Contusion A contusion is a deep bruise. This is a result of an injury that causes bleeding under the skin. Symptoms of bruising include pain, swelling, and discolored skin. The skin may turn blue, purple, or yellow. Follow these instructions at home: Managing pain, stiffness, and swelling You may use RICE. This stands for:  Resting.  Icing.  Compression, or putting pressure.  Elevating, or raising the injured area. To follow this method, do these actions:  Rest the injured area.  If told, put ice on the injured area. ? Put ice in a plastic bag. ? Place a towel between your skin and the bag. ? Leave the ice on for 20 minutes, 2-3 times per day.  If told, put light pressure (compression) on the injured area using an elastic bandage. Make sure the bandage is not too tight. If the area tingles or becomes numb, remove it and put it back on as told by your doctor.  If possible, raise (elevate) the injured area above the level of your heart while you are sitting or lying down.  General instructions  Take over-the-counter and prescription medicines only as told by your doctor.  Keep all follow-up visits as told by your doctor. This is important. Contact a doctor if:  Your symptoms do not get better after several days of treatment.  Your symptoms get worse.  You have trouble moving the injured area. Get help right away if:  You have very bad pain.  You have a loss of feeling (numbness) in a hand or foot.  Your hand or foot turns pale or cold. Summary  A contusion is a deep bruise. This is a result of an injury that causes bleeding under the skin.  Symptoms of bruising include pain, swelling, and discolored skin. The skin may turn blue, purple, or yellow.  This condition is treated with rest, ice, compression, and elevation. This is also called RICE. You may be given over-the-counter medicines for  pain.  Contact a doctor if you do not feel better, or you feel worse. Get help right away if you have very bad pain, have lost feeling in a hand or foot, or the area turns pale or cold. This information is not intended to replace advice given to you by your health care provider. Make sure you discuss any questions you have with your health care provider. Document Revised: 01/04/2018 Document Reviewed: 01/04/2018 Elsevier Patient Education  2020 [employer].

## 2019-11-04 ENCOUNTER — Ambulatory Visit (INDEPENDENT_AMBULATORY_CARE_PROVIDER_SITE_OTHER): Payer: Medicaid Other | Admitting: Psychology

## 2019-11-04 DIAGNOSIS — F3289 Other specified depressive episodes: Secondary | ICD-10-CM | POA: Diagnosis not present

## 2019-11-18 ENCOUNTER — Ambulatory Visit (INDEPENDENT_AMBULATORY_CARE_PROVIDER_SITE_OTHER): Payer: Medicaid Other | Admitting: Psychology

## 2019-11-18 DIAGNOSIS — F3289 Other specified depressive episodes: Secondary | ICD-10-CM

## 2019-11-24 ENCOUNTER — Ambulatory Visit: Payer: Medicaid Other | Admitting: Psychology

## 2019-11-25 ENCOUNTER — Ambulatory Visit: Payer: Medicaid Other | Admitting: Psychology

## 2019-11-26 ENCOUNTER — Other Ambulatory Visit: Payer: Self-pay | Admitting: Family Medicine

## 2019-11-26 DIAGNOSIS — M5442 Lumbago with sciatica, left side: Secondary | ICD-10-CM

## 2019-11-26 DIAGNOSIS — G8929 Other chronic pain: Secondary | ICD-10-CM

## 2019-11-26 DIAGNOSIS — E119 Type 2 diabetes mellitus without complications: Secondary | ICD-10-CM

## 2019-11-26 DIAGNOSIS — I10 Essential (primary) hypertension: Secondary | ICD-10-CM

## 2019-11-27 NOTE — Telephone Encounter (Signed)
Furosemide last refilled 08/27/19 #90 x 0 refills Ibuprofen 800mg  last refilled 09/26/2019 #60 x 0  Please advise, thanks.

## 2019-12-05 ENCOUNTER — Ambulatory Visit (INDEPENDENT_AMBULATORY_CARE_PROVIDER_SITE_OTHER): Payer: Medicaid Other | Admitting: Psychology

## 2019-12-05 DIAGNOSIS — F3289 Other specified depressive episodes: Secondary | ICD-10-CM

## 2019-12-08 ENCOUNTER — Other Ambulatory Visit: Payer: Self-pay | Admitting: Family Medicine

## 2019-12-08 DIAGNOSIS — E1142 Type 2 diabetes mellitus with diabetic polyneuropathy: Secondary | ICD-10-CM

## 2019-12-08 NOTE — Telephone Encounter (Signed)
Last refill 03/07/2019 #270 x 1 Last OV (acute) 11/03/19  Please advise, thanks.

## 2019-12-09 ENCOUNTER — Ambulatory Visit: Payer: Medicaid Other | Admitting: Psychology

## 2020-01-15 ENCOUNTER — Other Ambulatory Visit: Payer: Self-pay | Admitting: Family Medicine

## 2020-01-16 NOTE — Telephone Encounter (Signed)
Last office visit 11/03/2019 for injury to right shin. Last labs were 08/2019. Last visit that addressed HTN greater than a year. Ok to refill?

## 2020-01-28 ENCOUNTER — Other Ambulatory Visit: Payer: Self-pay | Admitting: Family Medicine

## 2020-01-28 DIAGNOSIS — M5442 Lumbago with sciatica, left side: Secondary | ICD-10-CM

## 2020-01-28 DIAGNOSIS — G8929 Other chronic pain: Secondary | ICD-10-CM

## 2020-02-23 ENCOUNTER — Other Ambulatory Visit: Payer: Self-pay | Admitting: Family Medicine

## 2020-02-23 DIAGNOSIS — I10 Essential (primary) hypertension: Secondary | ICD-10-CM

## 2020-03-05 DIAGNOSIS — G4733 Obstructive sleep apnea (adult) (pediatric): Secondary | ICD-10-CM | POA: Diagnosis not present

## 2020-03-08 ENCOUNTER — Other Ambulatory Visit: Payer: Self-pay | Admitting: Family Medicine

## 2020-03-08 DIAGNOSIS — E119 Type 2 diabetes mellitus without complications: Secondary | ICD-10-CM

## 2020-03-10 ENCOUNTER — Ambulatory Visit: Payer: Medicaid Other | Admitting: Family Medicine

## 2020-03-17 ENCOUNTER — Other Ambulatory Visit: Payer: Self-pay | Admitting: Family Medicine

## 2020-03-17 DIAGNOSIS — R351 Nocturia: Secondary | ICD-10-CM

## 2020-03-17 DIAGNOSIS — M5441 Lumbago with sciatica, right side: Secondary | ICD-10-CM

## 2020-03-17 DIAGNOSIS — N401 Enlarged prostate with lower urinary tract symptoms: Secondary | ICD-10-CM

## 2020-03-17 DIAGNOSIS — G8929 Other chronic pain: Secondary | ICD-10-CM

## 2020-03-19 NOTE — Telephone Encounter (Signed)
Pt was no show October, chart shows history of canceling appts. Last ov 03/07/19 low back pain. Last ov 11/21/2017 benign prostatic. How do you advise?

## 2020-03-27 DIAGNOSIS — J449 Chronic obstructive pulmonary disease, unspecified: Secondary | ICD-10-CM | POA: Diagnosis not present

## 2020-03-30 ENCOUNTER — Other Ambulatory Visit: Payer: Self-pay | Admitting: Family Medicine

## 2020-03-30 DIAGNOSIS — E1142 Type 2 diabetes mellitus with diabetic polyneuropathy: Secondary | ICD-10-CM

## 2020-04-09 ENCOUNTER — Other Ambulatory Visit: Payer: Self-pay | Admitting: Family Medicine

## 2020-04-21 ENCOUNTER — Ambulatory Visit (INDEPENDENT_AMBULATORY_CARE_PROVIDER_SITE_OTHER): Payer: Medicare HMO | Admitting: Family Medicine

## 2020-04-21 ENCOUNTER — Encounter: Payer: Self-pay | Admitting: Family Medicine

## 2020-04-21 ENCOUNTER — Other Ambulatory Visit: Payer: Self-pay

## 2020-04-21 VITALS — BP 118/70 | HR 88 | Temp 97.2°F | Ht 68.0 in | Wt 313.0 lb

## 2020-04-21 DIAGNOSIS — Z6841 Body Mass Index (BMI) 40.0 and over, adult: Secondary | ICD-10-CM | POA: Diagnosis not present

## 2020-04-21 DIAGNOSIS — M48061 Spinal stenosis, lumbar region without neurogenic claudication: Secondary | ICD-10-CM

## 2020-04-21 DIAGNOSIS — E559 Vitamin D deficiency, unspecified: Secondary | ICD-10-CM

## 2020-04-21 DIAGNOSIS — E1142 Type 2 diabetes mellitus with diabetic polyneuropathy: Secondary | ICD-10-CM | POA: Diagnosis not present

## 2020-04-21 DIAGNOSIS — M25552 Pain in left hip: Secondary | ICD-10-CM

## 2020-04-21 DIAGNOSIS — L853 Xerosis cutis: Secondary | ICD-10-CM

## 2020-04-21 LAB — HEMOGLOBIN A1C: Hgb A1c MFr Bld: 7 % — ABNORMAL HIGH (ref 4.6–6.5)

## 2020-04-21 LAB — VITAMIN D 25 HYDROXY (VIT D DEFICIENCY, FRACTURES): VITD: 80.39 ng/mL (ref 30.00–100.00)

## 2020-04-21 LAB — TSH: TSH: 0.77 u[IU]/mL (ref 0.35–4.50)

## 2020-04-21 MED ORDER — OXYCODONE-ACETAMINOPHEN 5-325 MG PO TABS: 1.0000 | ORAL_TABLET | Freq: Two times a day (BID) | ORAL | 0 refills | Status: DC | PRN

## 2020-04-21 NOTE — Progress Notes (Signed)
   Subjective:    Patient ID: Jonathon Graham, male    DOB: 07-Mar-1956, 64 y.o.   MRN: [id number]  HPI Chief Complaint  Patient presents with  . 6 Month Follow-up    Checks blood sugar sporadically.   This is a 64 yo male who presents today for follow up of chronic medical conditions. Continues to grieve the death of his mother.   DM type 2- appetite is normal, not watching his diet so carefully. Walking causes hip and back pain. Enjoys doing machines at gym. Felt like he had improvement with PT and would like to resume. Is willing to go to dietician.   Vitamin D def- taking high dose replacement  OSA- using CPAP, has been following with pulmonary   Review of Systems No chest pain, occasional SOB with exertion, no abdominal pain, no leg swelling    Objective:   Physical Exam Physical Exam  Constitutional: Oriented to person, place, and time. Appears well-developed and well-nourished.  HENT:  Head: Normocephalic and atraumatic.  Eyes: Conjunctivae are normal.  Neck: Normal range of motion. Neck supple.  Cardiovascular: Normal rate, regular rhythm and normal heart sounds.   Pulmonary/Chest: Effort normal and breath sounds normal.  Musculoskeletal: No lower extremity edema.   Neurological: Alert and oriented to person, place, and time.  Skin: Skin is warm and dry. Very flaky skin on lower extremities.  Psychiatric: Normal mood and affect. Behavior is normal. Judgment and thought content normal.  Vitals reviewed.     BP 118/70 (BP Location: Left Arm, Patient Position: Sitting, Cuff Size: Large)   Pulse 88   Temp (!) 97.2 F (36.2 C)   Ht 5\' 8"  (1.727 m)   Wt (!) 313 lb (142 kg)   SpO2 99%   BMI 47.59 kg/m  Wt Readings from Last 3 Encounters:  04/21/20 (!) 313 lb (142 kg)  11/03/19 (!) 312 lb 12.8 oz (141.9 kg)  09/17/19 (!) 309 lb (140.2 kg)       Assessment & Plan:  1. Spinal stenosis of lumbar region, unspecified whether neurogenic claudication present - agreed  to small amount pain medication for severe pain - oxyCODONE-acetaminophen (PERCOCET/ROXICET) 5-325 MG tablet; Take 1 tablet by mouth 2 (two) times daily as needed for severe pain.  Dispense: 10 tablet; Refill: 0 - Ambulatory referral to Physical Therapy  2. Left hip pain - oxyCODONE-acetaminophen (PERCOCET/ROXICET) 5-325 MG tablet; Take 1 tablet by mouth 2 (two) times daily as needed for severe pain.  Dispense: 10 tablet; Refill: 0 - Ambulatory referral to Physical Therapy  3. Type 2 diabetes, controlled, with peripheral neuropathy (HCC) - Hemoglobin A1c - Amb ref to Medical Nutrition Therapy-MNT  4. Vitamin D deficiency - Vitamin D, 25-hydroxy  5. Dry skin - TSH  6. BMI 50.0-59.9, adult (HCC) - Amb ref to Medical Nutrition Therapy-MNT  This visit occurred during the SARS-CoV-2 public health emergency.  Safety protocols were in place, including screening questions prior to the visit, additional usage of staff PPE, and extensive cleaning of exam room while observing appropriate contact time as indicated for disinfecting solutions.    09-24-1994, FNP-BC  [facility] at [facility], [facility]  04/21/2020 9:39 AM

## 2020-04-21 NOTE — Patient Instructions (Signed)
Good to see you today   There is not one right eating plan for everyone.  It may take trial and error to find what will work for you.  It is important to get adequate protein and fiber with your meals.  It is okay to not eat breakfast or to skip meals if you are not hungry.  Avoid snacking between meals.  Unless you are on a fluid restriction, drink 80 to 90 ounces of water a day.  Suggested resources- www.dietdoctor.com/diabetes/diet www.adaptyourlifeacademy.com-there is a quiz to help you determine how many carbohydrates you should eat a day  www.thefastingmethod.com  If you have diabetes or access to a blood sugar machine, I recommend you check your blood sugar daily and keep a log.  Vary the time you check your blood sugar such as fasting, before meal, 2 hours after a meal and at bedtime.  Look for trends with the foods you are eating and be a scientist of your body.  Here are some guidelines to help you with meal planning -  Avoid all processed and packaged foods (bread, pasta, crackers, chips, etc) and beverages containing calories.  Avoid added sugars and excessive natural sugars.  Pay attention to how you feel if you consume artificial sweeteners.  Do they make you more hungry or raise your blood sugar?  With every meal and snack, aim to get 20 g of protein (3 ounces of meat, 4 ounces of fish, 3 eggs, protein powder, 1 cup Greek yogurt, 1 cup cottage cheese, etc.)  Increase fiber in the form of non-starchy vegetables.  These help you feel full with very little carbohydrates and are good for gut health.  Nonstarchy vegetables include summer squash, onions, peppers, tomatoes, eggplant, broccoli, cauliflower, cabbage, lettuce, spinach.  Have small amounts of good fats such as avocado, nuts, olive oil, nut butters, olives.  Add a little cheese to your meals to make them tasty.   Try to plan your meals for the week and do some meal preparation when able.  If possible, make lunches for the  week ahead of time.  Plan a couple of dinners and make enough so you can have leftovers.  Build in a treat once a week.   

## 2020-04-23 ENCOUNTER — Other Ambulatory Visit: Payer: Self-pay | Admitting: Family Medicine

## 2020-04-23 DIAGNOSIS — E119 Type 2 diabetes mellitus without complications: Secondary | ICD-10-CM

## 2020-04-26 NOTE — Telephone Encounter (Signed)
Pharmacy requests refill on: Metformin HCL 1000 mg   LAST REFILL: 03/10/2020 (Q-180, R-0) LAST OV: 04/21/2020 NEXT OV: Not Scheduled  PHARMACY: [facility] [phone] [place], [place]  HgbA1C(04/21/2020): 7.0

## 2020-04-27 DIAGNOSIS — J449 Chronic obstructive pulmonary disease, unspecified: Secondary | ICD-10-CM | POA: Diagnosis not present

## 2020-05-18 ENCOUNTER — Other Ambulatory Visit: Payer: Self-pay | Admitting: Family Medicine

## 2020-05-18 DIAGNOSIS — I10 Essential (primary) hypertension: Secondary | ICD-10-CM

## 2020-05-18 DIAGNOSIS — M5442 Lumbago with sciatica, left side: Secondary | ICD-10-CM

## 2020-05-18 DIAGNOSIS — G8929 Other chronic pain: Secondary | ICD-10-CM

## 2020-05-18 NOTE — Telephone Encounter (Signed)
Pharmacy requests refill on: Ibuprofen 800 mg   LAST REFILL: 03/22/2020 (Q-60, R-0) LAST OV: 04/21/2020 NEXT OV: Not Scheduled  PHARMACY: [id number] [place], [place]   Pharmacy requests refill on: Furosemide 40 mg   LAST REFILL: 03/22/2020 (Q-90, R-0) LAST OV: 04/21/2020 NEXT OV: Not Scheduled  PHARMACY: [id number] [place], [place]

## 2020-05-18 NOTE — Telephone Encounter (Signed)
Pt called in wanted to know if he can get a 60-90 day supply of Ibuprofen

## 2020-05-26 ENCOUNTER — Encounter: Payer: Self-pay | Admitting: Dietician

## 2020-05-26 ENCOUNTER — Encounter: Payer: Medicare HMO | Attending: Family | Admitting: Dietician

## 2020-05-26 ENCOUNTER — Other Ambulatory Visit: Payer: Self-pay

## 2020-05-26 VITALS — Ht 67.5 in | Wt 311.3 lb

## 2020-05-26 DIAGNOSIS — E669 Obesity, unspecified: Secondary | ICD-10-CM | POA: Insufficient documentation

## 2020-05-26 DIAGNOSIS — E66813 Obesity, class 3: Secondary | ICD-10-CM

## 2020-05-26 DIAGNOSIS — Z6841 Body Mass Index (BMI) 40.0 and over, adult: Secondary | ICD-10-CM | POA: Insufficient documentation

## 2020-05-26 DIAGNOSIS — E1142 Type 2 diabetes mellitus with diabetic polyneuropathy: Secondary | ICD-10-CM

## 2020-05-26 NOTE — Progress Notes (Signed)
Medical Nutrition Therapy: Visit start time: 1100  end time: 1200  Assessment:  Diagnosis: Type 2 diabetes with peripheral neuropathy Past medical history: HTN, hyperlipidemia, sleep apnea Psychosocial issues/ stress concerns: depression  Preferred learning method:   Auditory  Visual  Hands-on   Current weight: 311.3lbs with shoes  Height: 5'7.5" Medications, supplements: reconciled list in medical record  Progress and evaluation:   Patient reports diabetes diagnosis in 2007; no previous DM education.   He is checking BGs daily, usually elevated after breakfast reports recent reading of >400. HbA1C on 04/21/20 was 7.0%.  Takes vitamin D supplement via bi-weekly injection due to history of deficiency.   He experienced deaths of several family members, including mother and 2 sisters over a period of several months, which led to depression and overeating/ weight gain.   He had lost over 70lbs, down to about 290lbs, but then became depressed again and regained some of the weight.   He now feels ready to resume healthier habits; he has stopped eating sweets such as donuts. Wife is supportive.    Physical therapy helped with back pain in the past, and patient is planning to resume therapy to help with pain management and increase activity for overall health.   Physical activity: none; planning to resume physical therapy  Dietary Intake:  Usual eating pattern includes 3 meals and 1 snacks per day. Dining out frequency: 5-6 meals per week.  Breakfast: pancakes with syrup; eggs and bacon Snack: none Lunch: [employer] roast beef and fries, soda Snack: none Supper: 6pm -- 2-3 slices pizza; rice chicken, gravy; varies Snack: cake; donuts krispy kreme Beverages: 64oz or more water daily; coffee with sugar; pineapple soda  Nutrition Care Education: Topics covered:  Basic nutrition: basic food groups, appropriate nutrient balance, appropriate meal and snack schedule, general nutrition  guidelines    Weight control: importance of low sugar and low fat choices, portion control, plate method for planning balanced meals with appropriate food portions; discussed role of physical activity Diabetes: appropriate meal and snack schedule, appropriate carb intake and balance, healthy carb choices, role of fiber, protein, fat Other:  Making gradual changes, a few at a time, to promote long term habits; importance of balance and moderation vs all-or-nothing approach to nutrition  Nutritional Diagnosis:  [place]-2.2 Altered nutrition-related laboratory As related to Type 2 diabetes.  As evidenced by recent HbA1C of 7.0%. [place]-3.3 Overweight/obesity As related to excess calories and inadequate physical activity, in part related to depressed mood and boredom.  As evidenced by patient with current BMI of 48, and patient report of dietary intake and emotional status.  Intervention:   Instruction and discussion as noted above.  Patient voices readiness to work on healthy diet changes, and has concrete plans to add physical activity.  Established goals for change with direction from patient.   Education Materials given:   General diet guidelines for Diabetes  Designer, industrial/product with food lists  Sample menus  Diabetes and You booklet ([employer])  Carb Counting and Meal Planning booklet ([employer])  Visit summary goals/ instructions   Learner/ who was taught:   Patient   Level of understanding:  Verbalizes/ demonstrates competency   Demonstrated degree of understanding via:   Teach back Learning barriers:  None  Willingness to learn/ readiness for change:  Eager, change in progress   Monitoring and Evaluation:  Dietary intake, exercise, BG control, and body weight      follow up: 06/25/20 at 11:00am

## 2020-05-26 NOTE — Patient Instructions (Signed)
   Plan meals ahead to ensure a healthy balance. Increase low carb vegetables.  Reduce portions of starchy foods, breads, sweets, pancakes. Eat until satisfied and not full.  Eat more meals and home and less restaurant foods.   Resume physical therapy to gradually increase activity.   Continue to check blood sugars regularly.

## 2020-05-27 DIAGNOSIS — J449 Chronic obstructive pulmonary disease, unspecified: Secondary | ICD-10-CM | POA: Diagnosis not present

## 2020-06-07 ENCOUNTER — Telehealth: Payer: Self-pay | Admitting: Family Medicine

## 2020-06-07 DIAGNOSIS — N401 Enlarged prostate with lower urinary tract symptoms: Secondary | ICD-10-CM

## 2020-06-07 DIAGNOSIS — E1142 Type 2 diabetes mellitus with diabetic polyneuropathy: Secondary | ICD-10-CM

## 2020-06-07 DIAGNOSIS — G4733 Obstructive sleep apnea (adult) (pediatric): Secondary | ICD-10-CM | POA: Diagnosis not present

## 2020-06-07 DIAGNOSIS — E119 Type 2 diabetes mellitus without complications: Secondary | ICD-10-CM

## 2020-06-07 MED ORDER — METFORMIN HCL 1000 MG PO TABS: ORAL_TABLET | ORAL | 0 refills | Status: DC

## 2020-06-07 MED ORDER — GABAPENTIN 300 MG PO CAPS: 300.0000 mg | ORAL_CAPSULE | Freq: Three times a day (TID) | ORAL | 0 refills | Status: DC

## 2020-06-07 MED ORDER — TAMSULOSIN HCL 0.4 MG PO CAPS: 0.4000 mg | ORAL_CAPSULE | Freq: Every day | ORAL | 0 refills | Status: DC

## 2020-06-07 NOTE — Telephone Encounter (Signed)
Okay to refill as requested to appt in 07/2020

## 2020-06-07 NOTE — Telephone Encounter (Signed)
Left message for Jonathon Graham that refills have been sent to his pharmacy.

## 2020-06-07 NOTE — Telephone Encounter (Signed)
Patient called in stating he is needing to have: tamsulosin (FLOMAX) 0.4 MG CAPS capsule  metFORMIN (GLUCOPHAGE) 1000 MG tablet gabapentin (NEURONTIN) 300 MG capsule  Refilled. Stated he spoke with [facility] a week ago to fill but has not heard back since. Please advise.

## 2020-06-07 NOTE — Addendum Note (Signed)
Addended by: Damita Lack on: 06/07/2020 03:25 PM   Modules accepted: Orders

## 2020-06-07 NOTE — Telephone Encounter (Signed)
Routing to [employer] as pt has upcoming appt with her.

## 2020-06-07 NOTE — Telephone Encounter (Signed)
Patient advised.

## 2020-06-25 ENCOUNTER — Ambulatory Visit: Payer: Medicare HMO | Admitting: Dietician

## 2020-06-27 DIAGNOSIS — J449 Chronic obstructive pulmonary disease, unspecified: Secondary | ICD-10-CM | POA: Diagnosis not present

## 2020-07-15 ENCOUNTER — Other Ambulatory Visit: Payer: Self-pay

## 2020-07-15 MED ORDER — HYDROCHLOROTHIAZIDE 12.5 MG PO CAPS: 12.5000 mg | ORAL_CAPSULE | Freq: Every day | ORAL | 0 refills | Status: DC

## 2020-07-15 NOTE — Telephone Encounter (Signed)
Pharmacy requests refill on: Hydrochlorothiazide 12.5 mg   LAST REFILL: 04/12/2020 (Q-90, R-0) LAST OV: 04/21/2020 NEXT OV: 07/27/2020 PHARMACY: [id number] [place], [place]

## 2020-07-19 ENCOUNTER — Encounter: Payer: Self-pay | Admitting: Dietician

## 2020-07-19 MED ORDER — HYDROCHLOROTHIAZIDE 12.5 MG PO CAPS: 12.5000 mg | ORAL_CAPSULE | Freq: Every day | ORAL | 0 refills | Status: DC

## 2020-07-19 NOTE — Addendum Note (Signed)
Addended by: Damita Lack on: 07/19/2020 10:05 AM   Modules accepted: Orders

## 2020-07-21 ENCOUNTER — Other Ambulatory Visit: Payer: Self-pay | Admitting: Family Medicine

## 2020-07-21 DIAGNOSIS — G8929 Other chronic pain: Secondary | ICD-10-CM

## 2020-07-21 DIAGNOSIS — M5442 Lumbago with sciatica, left side: Secondary | ICD-10-CM

## 2020-07-21 MED ORDER — IBUPROFEN 800 MG PO TABS: 800.0000 mg | ORAL_TABLET | Freq: Three times a day (TID) | ORAL | 0 refills | Status: DC | PRN

## 2020-07-21 NOTE — Telephone Encounter (Signed)
Patient states that his pharmacy has sent over several request for a medication refill.  Its for Ibuprofen 800 mg.  Pharmacy [facility] [address] [place]. Please advise. EM

## 2020-07-21 NOTE — Telephone Encounter (Signed)
Name of Medication: ibuprofen Name of Pharmacy: [facility] Last Fill or Written Date and Quantity: 05/18/20, #60 Last Office Visit and Type: 04/21/20, f/u back pain Next Office Visit and Type: TOC, w/Dr.Bedsole

## 2020-07-21 NOTE — Addendum Note (Signed)
Addended by: Sherrie George on: 07/21/2020 12:52 PM   Modules accepted: Orders

## 2020-07-26 DIAGNOSIS — J449 Chronic obstructive pulmonary disease, unspecified: Secondary | ICD-10-CM | POA: Diagnosis not present

## 2020-07-27 ENCOUNTER — Encounter: Payer: Self-pay | Admitting: Family Medicine

## 2020-07-27 ENCOUNTER — Other Ambulatory Visit: Payer: Self-pay

## 2020-07-27 ENCOUNTER — Ambulatory Visit (INDEPENDENT_AMBULATORY_CARE_PROVIDER_SITE_OTHER): Payer: Medicare HMO | Admitting: Family Medicine

## 2020-07-27 VITALS — BP 106/68 | HR 87 | Temp 97.5°F | Ht 67.5 in | Wt 310.5 lb

## 2020-07-27 DIAGNOSIS — M797 Fibromyalgia: Secondary | ICD-10-CM | POA: Diagnosis not present

## 2020-07-27 DIAGNOSIS — G4733 Obstructive sleep apnea (adult) (pediatric): Secondary | ICD-10-CM | POA: Diagnosis not present

## 2020-07-27 DIAGNOSIS — G8929 Other chronic pain: Secondary | ICD-10-CM

## 2020-07-27 DIAGNOSIS — E1142 Type 2 diabetes mellitus with diabetic polyneuropathy: Secondary | ICD-10-CM | POA: Diagnosis not present

## 2020-07-27 DIAGNOSIS — I152 Hypertension secondary to endocrine disorders: Secondary | ICD-10-CM

## 2020-07-27 DIAGNOSIS — M545 Low back pain, unspecified: Secondary | ICD-10-CM

## 2020-07-27 DIAGNOSIS — L409 Psoriasis, unspecified: Secondary | ICD-10-CM | POA: Diagnosis not present

## 2020-07-27 DIAGNOSIS — E1149 Type 2 diabetes mellitus with other diabetic neurological complication: Secondary | ICD-10-CM

## 2020-07-27 DIAGNOSIS — J439 Emphysema, unspecified: Secondary | ICD-10-CM | POA: Diagnosis not present

## 2020-07-27 DIAGNOSIS — E1159 Type 2 diabetes mellitus with other circulatory complications: Secondary | ICD-10-CM | POA: Diagnosis not present

## 2020-07-27 DIAGNOSIS — E1169 Type 2 diabetes mellitus with other specified complication: Secondary | ICD-10-CM

## 2020-07-27 DIAGNOSIS — F322 Major depressive disorder, single episode, severe without psychotic features: Secondary | ICD-10-CM

## 2020-07-27 DIAGNOSIS — Z6841 Body Mass Index (BMI) 40.0 and over, adult: Secondary | ICD-10-CM

## 2020-07-27 DIAGNOSIS — Z125 Encounter for screening for malignant neoplasm of prostate: Secondary | ICD-10-CM

## 2020-07-27 DIAGNOSIS — E1140 Type 2 diabetes mellitus with diabetic neuropathy, unspecified: Secondary | ICD-10-CM | POA: Insufficient documentation

## 2020-07-27 DIAGNOSIS — E785 Hyperlipidemia, unspecified: Secondary | ICD-10-CM

## 2020-07-27 LAB — HM DIABETES FOOT EXAM

## 2020-07-27 NOTE — Assessment & Plan Note (Signed)
  Stable, chronic.  Continue current medication.    On Advair 1 puff  daily  Albuterol prn.

## 2020-07-27 NOTE — Progress Notes (Signed)
Patient ID: Jonathon Graham, male    DOB: November 03, 1955, 65 y.o.   MRN: [id number]  This visit was conducted in person.  BP 106/68   Pulse 87   Temp (!) 97.5 F (36.4 C) (Temporal)   Ht 5' 7.5" (1.715 m)   Wt (!) 310 lb 8 oz (140.8 kg)   SpO2 93%   BMI 47.91 kg/m    CC:  Chief Complaint  Patient presents with  . Establish Care    TOC from D. Gessner    Subjective:   HPI: Jonathon Graham is a 65 y.o. male presenting on 07/27/2020 for Establish Care (TOC from D. Carlean Purl)   CPX 1.5 years ago, but last DM visit in 03/2020  Diabetes:   Stable control at last check .. due for re-eval.  on metformin  1000 mg BID. Lab Results  Component Value Date   HGBA1C 7.0 (H) 04/21/2020  Using medications without difficulties: Hypoglycemic episodes: Hyperglycemic episodes: Feet problems: no ulcers Blood Sugars averaging: eye exam within last year: due   Morbid obesity  Body mass index is 47.91 kg/m. Wt Readings from Last 3 Encounters:  07/27/20 (!) 310 lb 8 oz (140.8 kg)  05/26/20 (!) 311 lb 4.8 oz (141.2 kg)  04/21/20 (!) 313 lb (142 kg)    Chronic back pain since work injury 2017 Now going to  PT.  Has back specialist at [facility] in past. On ibuprofen 800 mg once daily. Using oxycodone  1/2 tabonce daily. Using 10 in 3-4 months  Fibromyalgia: On 300 mg twice daily gabapentin    Sleep apnea: on CPAP, COPD: followed by Pulmonary. Quit smoking 1 year ago.  Relevant past medical, surgical, family and social history reviewed and updated as indicated. Interim medical history since our last visit reviewed. Allergies and medications reviewed and updated. Outpatient Medications Prior to Visit  Medication Sig Dispense Refill  . acetaminophen (TYLENOL) 500 MG tablet Take 1 tablet (500 mg total) by mouth every 8 (eight) hours as needed. 30 tablet 0  . albuterol (PROAIR HFA) 108 (90 Base) MCG/ACT inhaler Inhale 1-2 puffs into the lungs every 4 (four) hours as needed for wheezing or shortness  of breath. 1 Inhaler 5  . albuterol (PROVENTIL) (2.5 MG/3ML) 0.083% nebulizer solution Take 3 mLs (2.5 mg total) by nebulization every 6 (six) hours as needed for wheezing or shortness of breath. 360 mL 6  . aspirin 81 MG tablet Take 243 mg by mouth daily. Takes 4 27m aspirin daily.    .Marland Kitchenatorvastatin (LIPITOR) 20 MG tablet Take 1 tablet by mouth once daily 90 tablet 1  . betamethasone valerate ointment (VALISONE) 0.1 % Apply 1 application topically 2 (two) times daily. For up to 10 days. 30 g 0  . blood glucose meter kit and supplies KIT Dispense based on patient and insurance preference. Use up to four times daily as directed. (FOR ICD-9 250.00, 250.01). 1 each 0  . Cholecalciferol (VITAMIN D3) 1.25 MG (50000 UT) CAPS TAKE 1 CAPSULE BY MOUTH TWICE A WEEK    . clotrimazole (LOTRIMIN) 1 % cream Apply 1 application 2 (two) times daily topically. For 7 days 28 g 0  . DULoxetine (CYMBALTA) 60 MG capsule Take 1 capsule (60 mg total) by mouth daily. 90 capsule 1  . Elastic Bandages & Supports (MEDICAL COMPRESSION SOCKS) MISC 1 each by Does not apply route daily. 1 each 0  . Fluticasone-Salmeterol (ADVAIR DISKUS) 250-50 MCG/DOSE AEPB Inhale 1 puff into the lungs 2 (two) times  daily. 180 each 3  . furosemide (LASIX) 40 MG tablet Take 1 tablet by mouth once daily 90 tablet 1  . gabapentin (NEURONTIN) 300 MG capsule Take 1 capsule (300 mg total) by mouth 3 (three) times daily. 270 capsule 0  . hydrochlorothiazide (MICROZIDE) 12.5 MG capsule Take 1 capsule (12.5 mg total) by mouth daily. 90 capsule 0  . ibuprofen (ADVIL) 800 MG tablet Take 1 tablet (800 mg total) by mouth every 8 (eight) hours as needed. 60 tablet 0  . metFORMIN (GLUCOPHAGE) 1000 MG tablet TAKE 1 TABLET BY MOUTH TWICE DAILY WITH A MEAL 180 tablet 0  . Misc. Devices (WRIST BRACE) MISC 2 Units by Does not apply route. Wear on wrists as needed for support    . ondansetron (ZOFRAN) 8 MG tablet Take 1 tablet (8 mg total) by mouth every 8 (eight)  hours as needed for nausea or vomiting. 12 tablet 0  . oxyCODONE-acetaminophen (PERCOCET/ROXICET) 5-325 MG tablet Take 1 tablet by mouth 2 (two) times daily as needed for severe pain. 10 tablet 0  . tamsulosin (FLOMAX) 0.4 MG CAPS capsule Take 1 capsule (0.4 mg total) by mouth daily. 90 capsule 0   No facility-administered medications prior to visit.     Per HPI unless specifically indicated in ROS section below Review of Systems  Constitutional: Negative for fatigue and fever.  HENT: Negative for ear pain.   Eyes: Negative for pain.  Respiratory: Negative for cough and shortness of breath.   Cardiovascular: Negative for chest pain, palpitations and leg swelling.  Gastrointestinal: Negative for abdominal pain.  Genitourinary: Negative for dysuria.  Musculoskeletal: Negative for arthralgias.  Neurological: Negative for syncope, light-headedness and headaches.  Psychiatric/Behavioral: Negative for dysphoric mood.   Objective:  BP 106/68   Pulse 87   Temp (!) 97.5 F (36.4 C) (Temporal)   Ht 5' 7.5" (1.715 m)   Wt (!) 310 lb 8 oz (140.8 kg)   SpO2 93%   BMI 47.91 kg/m   Wt Readings from Last 3 Encounters:  07/27/20 (!) 310 lb 8 oz (140.8 kg)  05/26/20 (!) 311 lb 4.8 oz (141.2 kg)  04/21/20 (!) 313 lb (142 kg)      Physical Exam Constitutional:      General: Vital signs are normal.     Appearance: He is well-developed and well-nourished. He is obese.  HENT:     Head: Normocephalic.     Right Ear: Hearing normal.     Left Ear: Hearing normal.     Nose: Nose normal.     Mouth/Throat:     Mouth: Oropharynx is clear and moist and mucous membranes are normal.  Neck:     Thyroid: No thyroid mass or thyromegaly.     Vascular: No carotid bruit.     Trachea: Trachea normal.  Cardiovascular:     Rate and Rhythm: Normal rate and regular rhythm.     Pulses: Normal pulses.     Heart sounds: Heart sounds not distant. No murmur heard. No friction rub. No gallop.      Comments: No  peripheral edema Pulmonary:     Effort: Pulmonary effort is normal. No respiratory distress.     Breath sounds: Normal breath sounds.  Skin:    General: Skin is warm, dry and intact.     Findings: No rash.  Psychiatric:        Mood and Affect: Mood and affect normal.        Speech: Speech normal.  Behavior: Behavior normal.        Thought Content: Thought content normal.      Diabetic foot exam: Normal inspection No skin breakdown No calluses  Normal DP pulses Normal sensation to light touch and monofilament Nails normal     Results for orders placed or performed in visit on 04/21/20  Vitamin D, 25-hydroxy  Result Value Ref Range   VITD 80.39 30.00 - 100.00 ng/mL  Hemoglobin A1c  Result Value Ref Range   Hgb A1c MFr Bld 7.0 (H) 4.6 - 6.5 %  TSH  Result Value Ref Range   TSH 0.77 0.35 - 4.50 uIU/mL    This visit occurred during the SARS-CoV-2 public health emergency.  Safety protocols were in place, including screening questions prior to the visit, additional usage of staff PPE, and extensive cleaning of exam room while observing appropriate contact time as indicated for disinfecting solutions.   COVID 19 screen:  No recent travel or known exposure to COVID19 The patient denies respiratory symptoms of COVID 19 at this time. The importance of social distancing was discussed today.   Assessment and Plan    Problem List Items Addressed This Visit    Chronic midline low back pain    Using   Oxycodone  Prn. 10 tablets last 3-4 months. PDMP reviewed during this encounter.       COPD (chronic obstructive pulmonary disease) (HCC) (Chronic)     Stable, chronic.  Continue current medication.    On Advair 1 puff  daily  Albuterol prn.      Depression, major, single episode, severe (HCC) (Chronic)    Stable, chronic.  Continue current medication.    Cymbalta 60 mg daily. Seeing Dr. Ceasar Lund psychologist.      Fibromyalgia (Chronic)    Stable, chronic.   Continue current medication.   300 mg twice daily gabapentin      HLD (hyperlipidemia) (Chronic)     On lipitor  20 mg daily   Lab Results  Component Value Date   CHOL 97 09/17/2019   HDL 31.90 (L) 09/17/2019   LDLCALC 49 09/17/2019   TRIG 79.0 09/17/2019   CHOLHDL 3 09/17/2019         HTN (hypertension) (Chronic)    BP Readings from Last 3 Encounters:  07/27/20 106/68  04/21/20 118/70  11/03/19 110/70   HCTZ  Low  12.5 mg daily. Occ using lasix prn. Not sure why he is on both.       Psoriasis    Using topical steroid prn.      Severe obstructive sleep apnea (Chronic)     On CPAP      Type 2 diabetes, controlled, with peripheral neuropathy (HCC) - Primary (Chronic)    Stable control at last check .. due for re-eval.  On metformin  1000 mg BID. Encouraged exercise, weight loss, healthy eating habits.       Relevant Orders   Hemoglobin A1c   Lipid panel   Comprehensive metabolic panel   Microalbumin / creatinine urine ratio    Other Visit Diagnoses    Prostate cancer screening       Relevant Orders   PSA, Medicare     Orders Placed This Encounter  Procedures  . Hemoglobin A1c    Standing Status:   Future    Standing Expiration Date:   10/25/2020  . Lipid panel    Standing Status:   Future    Standing Expiration Date:   10/25/2020  . Comprehensive  metabolic panel    Standing Status:   Future    Standing Expiration Date:   10/25/2020  . Microalbumin / creatinine urine ratio    Standing Status:   Future    Standing Expiration Date:   07/27/2021  . PSA, Medicare    Standing Status:   Future    Standing Expiration Date:   11/04/2020  . [facility] DIABETES FOOT EXAM    This external order was created through the Results Console.     Eliezer Lofts, MD

## 2020-07-27 NOTE — Assessment & Plan Note (Signed)
Tolerable control of pain. On gabapentin.  Normal foot exam.

## 2020-07-27 NOTE — Assessment & Plan Note (Signed)
Stable, chronic.  Continue current medication.    Cymbalta 60 mg daily. Seeing Dr. Hulan Amato psychologist.

## 2020-07-27 NOTE — Patient Instructions (Addendum)
Set up yearly eye exam for diabetes and have the opthalmologist send [place] a copy of the evaluation for the chart.  Work on [employer] and regular exercise.  Low carb  and heart healthy diet.

## 2020-07-27 NOTE — Assessment & Plan Note (Signed)
On CPAP. ?

## 2020-07-27 NOTE — Assessment & Plan Note (Signed)
Using   Oxycodone  Prn. 10 tablets last 3-4 months. PDMP reviewed during this encounter.

## 2020-07-27 NOTE — Assessment & Plan Note (Signed)
High risk BMI. Encouraged exercise, weight loss, healthy eating habits.

## 2020-07-27 NOTE — Assessment & Plan Note (Signed)
On lipitor  20 mg daily   Lab Results  Component Value Date   CHOL 97 09/17/2019   HDL 31.90 (L) 09/17/2019   LDLCALC 49 09/17/2019   TRIG 79.0 09/17/2019   CHOLHDL 3 09/17/2019

## 2020-07-27 NOTE — Assessment & Plan Note (Signed)
Using topical steroid prn.

## 2020-07-27 NOTE — Assessment & Plan Note (Addendum)
Stable control at last check .. due for re-eval.  On metformin  1000 mg BID. Encouraged exercise, weight loss, healthy eating habits.

## 2020-07-27 NOTE — Assessment & Plan Note (Addendum)
BP Readings from Last 3 Encounters:  07/27/20 106/68  04/21/20 118/70  11/03/19 110/70   HCTZ  Low  12.5 mg daily. Occ using lasix prn. Not sure why he is on both.

## 2020-07-27 NOTE — Assessment & Plan Note (Signed)
Stable, chronic.  Continue current medication.   300 mg twice daily gabapentin

## 2020-08-04 ENCOUNTER — Other Ambulatory Visit: Payer: Self-pay | Admitting: Family Medicine

## 2020-08-04 DIAGNOSIS — E1142 Type 2 diabetes mellitus with diabetic polyneuropathy: Secondary | ICD-10-CM

## 2020-08-04 NOTE — Telephone Encounter (Signed)
Last office visit 07/27/2020 for Establish Care.  Last refilled 06/07/2020 for #[id number] with no refills. CPE scheduled for 09/28/2020.

## 2020-08-12 MED ORDER — GABAPENTIN 300 MG PO CAPS: 300.0000 mg | ORAL_CAPSULE | Freq: Three times a day (TID) | ORAL | 0 refills | Status: DC

## 2020-08-12 NOTE — Telephone Encounter (Signed)
  LAST APPOINTMENT DATE: 07/27/2020   NEXT APPOINTMENT DATE:@4 /26/2022  MEDICATION:gabapentin 300mg   PHARMACY: [facility] - [address]  Let patient know to contact pharmacy at the end of the day to make sure medication is ready.  Please notify patient to allow 48-72 hours to process  Encourage patient to contact the pharmacy for refills or they can request refills through [facility]  CLINICAL FILLS OUT ALL BELOW:   LAST REFILL:  QTY:  REFILL DATE:    OTHER COMMENTS:    Okay for refill?  Please advise

## 2020-08-12 NOTE — Addendum Note (Signed)
Addended by: Damita Lack on: 08/12/2020 04:34 PM   Modules accepted: Orders

## 2020-08-12 NOTE — Telephone Encounter (Signed)
Last office visit 07/27/2020 for establish care.  Last refilled 08/04/2020 for #[id number] with no refills.  CPE scheduled for 09/28/2020.

## 2020-08-25 DIAGNOSIS — J449 Chronic obstructive pulmonary disease, unspecified: Secondary | ICD-10-CM | POA: Diagnosis not present

## 2020-09-02 ENCOUNTER — Ambulatory Visit: Payer: Medicare HMO | Admitting: Pulmonary Disease

## 2020-09-08 DIAGNOSIS — G4733 Obstructive sleep apnea (adult) (pediatric): Secondary | ICD-10-CM | POA: Diagnosis not present

## 2020-09-11 ENCOUNTER — Other Ambulatory Visit: Payer: Self-pay | Admitting: Family Medicine

## 2020-09-11 DIAGNOSIS — M5442 Lumbago with sciatica, left side: Secondary | ICD-10-CM

## 2020-09-11 DIAGNOSIS — N401 Enlarged prostate with lower urinary tract symptoms: Secondary | ICD-10-CM

## 2020-09-11 DIAGNOSIS — R351 Nocturia: Secondary | ICD-10-CM

## 2020-09-11 DIAGNOSIS — G8929 Other chronic pain: Secondary | ICD-10-CM

## 2020-09-13 ENCOUNTER — Telehealth: Payer: Self-pay

## 2020-09-13 DIAGNOSIS — E119 Type 2 diabetes mellitus without complications: Secondary | ICD-10-CM

## 2020-09-13 MED ORDER — METFORMIN HCL 1000 MG PO TABS
ORAL_TABLET | ORAL | 1 refills | Status: DC
Start: 2020-09-13 — End: 2021-01-04

## 2020-09-13 NOTE — Telephone Encounter (Signed)
Pharmacy requests refill on: Metformin 1000 mg   LAST REFILL: 06/07/2020 (Q-180, R-0) LAST OV: 07/27/2020 NEXT OV: 09/28/2020 PHARMACY: [facility] [phone] [place], [place]  Hgb A1C (04/21/2020): 7.0

## 2020-09-13 NOTE — Telephone Encounter (Signed)
Last office visit 07/27/2020 for Establish Care.  Last refilled 07/21/2020 for #[id number] with no refills.  CPE scheduled for 09/28/2020.

## 2020-09-21 ENCOUNTER — Ambulatory Visit (INDEPENDENT_AMBULATORY_CARE_PROVIDER_SITE_OTHER): Payer: Medicare HMO

## 2020-09-21 ENCOUNTER — Other Ambulatory Visit: Payer: Medicare HMO

## 2020-09-21 DIAGNOSIS — Z0000 Encounter for general adult medical examination without abnormal findings: Secondary | ICD-10-CM

## 2020-09-21 NOTE — Progress Notes (Signed)
Subjective:   Jonathon Graham is a 65 y.o. male who presents for Medicare Annual/Subsequent preventive examination.  Review of Systems: N/A      I connected with the patient today by telephone and verified that I am speaking with the correct person using two identifiers. Location patient: home Location nurse: work Persons participating in the telephone visit: patient, nurse.   I discussed the limitations, risks, security and privacy concerns of performing an evaluation and management service by telephone and the availability of in person appointments. I also discussed with the patient that there may be a patient responsible charge related to this service. The patient expressed understanding and verbally consented to this telephonic visit.        Cardiac Risk Factors include: advanced age (>74mn, >>14women);male gender;diabetes mellitus;Other (see comment), Risk factor comments: hyperlipidemia     Objective:    Today's Vitals   09/21/20 1157  PainSc: 7    There is no height or weight on file to calculate BMI.  Advanced Directives 09/21/2020 05/26/2020 03/24/2017 03/24/2017 03/15/2017 03/15/2017 11/20/2016  Does Patient Have a Medical Advance Directive? Yes _0  No  Type of [place] - - - - - -  Copy of HV[place]ain Chart? No - copy requested - - - - - -  Would patient like information on creating a medical advance directive? No - Patient declined Yes (MAU/Ambulatory/Procedural Areas - Information given) No - Patient declined - Yes (ED - Information included in AVS) - No - Patient declined    Current Medications (verified) Outpatient Encounter Medications as of 09/21/2020  Medication Sig  . acetaminophen (TYLENOL) 500 MG tablet Take 1 tablet (500 mg total) by mouth every 8 (eight) hours as needed.  .Marland Kitchenalbuterol (PROAIR HFA) 108 (90 Base) MCG/ACT inhaler Inhale 1-2 puffs into the lungs every 4 (four) hours  as needed for wheezing or shortness of breath.  .Marland Kitchenalbuterol (PROVENTIL) (2.5 MG/3ML) 0.083% nebulizer solution Take 3 mLs (2.5 mg total) by nebulization every 6 (six) hours as needed for wheezing or shortness of breath.  .Marland Kitchenaspirin 81 MG tablet Take 243 mg by mouth daily. Takes 4 845maspirin daily.  . Marland Kitchentorvastatin (LIPITOR) 20 MG tablet Take 1 tablet by mouth once daily  . betamethasone valerate ointment (VALISONE) 0.1 % Apply 1 application topically 2 (two) times daily. For up to 10 days.  . blood glucose meter kit and supplies KIT Dispense based on patient and insurance preference. Use up to four times daily as directed. (FOR ICD-9 250.00, 250.01).  . Cholecalciferol (VITAMIN D3) 1.25 MG (50000 UT) CAPS TAKE 1 CAPSULE BY MOUTH TWICE A WEEK  . clotrimazole (LOTRIMIN) 1 % cream Apply 1 application 2 (two) times daily topically. For 7 days  . DULoxetine (CYMBALTA) 60 MG capsule Take 1 capsule (60 mg total) by mouth daily.  . Water engineerandages & Supports (MEDICAL COMPRESSION SOCKS) MISC 1 each by Does not apply route daily.  . Fluticasone-Salmeterol (ADVAIR DISKUS) 250-50 MCG/DOSE AEPB Inhale 1 puff into the lungs 2 (two) times daily.  . furosemide (LASIX) 40 MG tablet Take 1 tablet by mouth once daily  . gabapentin (NEURONTIN) 300 MG capsule Take 1 capsule (300 mg total) by mouth 3 (three) times daily.  . hydrochlorothiazide (MICROZIDE) 12.5 MG capsule Take 1 capsule (12.5 mg total) by mouth daily.  . Marland Kitchenbuprofen (ADVIL) 800 MG tablet TAKE 1 TABLET BY MOUTH EVERY 8 HOURS AS NEEDED  .  metFORMIN (GLUCOPHAGE) 1000 MG tablet TAKE 1 TABLET BY MOUTH TWICE DAILY WITH A MEAL  . Misc. Devices (WRIST BRACE) MISC 2 Units by Does not apply route. Wear on wrists as needed for support  . ondansetron (ZOFRAN) 8 MG tablet Take 1 tablet (8 mg total) by mouth every 8 (eight) hours as needed for nausea or vomiting.  Marland Kitchen oxyCODONE-acetaminophen (PERCOCET/ROXICET) 5-325 MG tablet Take 1 tablet by mouth 2 (two) times daily as  needed for severe pain.  . tamsulosin (FLOMAX) 0.4 MG CAPS capsule Take 1 capsule by mouth once daily   No facility-administered encounter medications on file as of 09/21/2020.    Allergies (verified) Penicillins   History: Past Medical History:  Diagnosis Date  . Arthritis   . Benign prostatic hyperplasia (BPH) with urinary urgency    nodular  . COPD (chronic obstructive pulmonary disease) ([place])   . Depression   . Diabetes mellitus without complication ([place])   . Essential hypertension   . Fibromyalgia, primary   . Hyperlipidemia   . Hypertension   . Obesity    History reviewed. No pertinent surgical history. Family History  Problem Relation Age of Onset  . Heart disease Father   . Diabetes Mellitus II Sister   . Myasthenia gravis Sister   . Fibrocystic breast disease Sister   . Hypertension Sister   . Cancer Sister 55        sinus cancer  . Diabetes Mellitus II Brother   . Heart disease Mother    Social History   Socioeconomic History  . Marital status: Married    Spouse name: Not on file  . Number of children: Not on file  . Years of education: Not on file  . Highest education level: Not on file  Occupational History  . Occupation: retired  Tobacco Use  . Smoking status: Former Smoker    Types: Cigarettes    Quit date: 03/28/2017    Years since quitting: 3.4  . Smokeless tobacco: Never Used  Substance and Sexual Activity  . Alcohol use: No    Alcohol/week: 0.0 standard drinks  . Drug use: No  . Sexual activity: Yes  Other Topics Concern  . Not on file  Social History Narrative  . Not on file   Social Determinants of Health   Financial Resource Strain: Low Risk   . Difficulty of Paying Living Expenses: Not hard at all  Food Insecurity: No Food Insecurity  . Worried About Charity fundraiser in the Last Year: Never true  . Ran Out of Food in the Last Year: Never true  Transportation Needs: No Transportation Needs  . Lack of Transportation  (Medical): No  . Lack of Transportation (Non-Medical): No  Physical Activity: Inactive  . Days of Exercise per Week: 0 days  . Minutes of Exercise per Session: 0 min  Stress: Stress Concern Present  . Feeling of Stress : To some extent  Social Connections: Not on file    Tobacco Counseling Counseling given: Not Answered   Clinical Intake:  Pre-visit preparation completed: Yes  Pain : 0-10 Pain Score: 7  Pain Type: Chronic pain Pain Location:  (all over body) Pain Descriptors / Indicators: Aching Pain Onset: More than a month ago Pain Frequency: Intermittent     Nutritional Risks: None Diabetes: Yes CBG done?: No Did pt. bring in CBG monitor from home?: No  How often do you need to have someone help you when you read instructions, pamphlets, or other written materials  from your doctor or pharmacy?: 1 - Never What is the last grade level you completed in school?: 12th, some college  Diabetic: Yes Nutrition Risk Assessment:  Has the patient had any N/V/D within the last 2 months?  No  Does the patient have any non-healing wounds?  No  Has the patient had any unintentional weight loss or weight gain?  No   Diabetes:  Is the patient diabetic?  Yes  If diabetic, was a CBG obtained today?  No  telephone visit  Did the patient bring in their glucometer from home?  No  telephone visit  How often do you monitor your CBG's? Sometimes, not everyday.   Financial Strains and Diabetes Management:  Are you having any financial strains with the device, your supplies or your medication? No .  Does the patient want to be seen by Chronic Care Management for management of their diabetes?  No  Would the patient like to be referred to a Nutritionist or for Diabetic Management?  No   Diabetic Exams:  Diabetic Eye Exam: Overdue for diabetic eye exam. Pt has been advised about the importance in completing this exam. Patient advised to call and schedule an eye exam. Diabetic Foot  Exam: Completed 07/27/2020   Interpreter Needed?: No  Information entered by :: CJohnson, LPN   Activities of Daily Living In your present state of health, do you have any difficulty performing the following activities: 09/21/2020  Hearing? N  Vision? N  Difficulty concentrating or making decisions? N  Walking or climbing stairs? N  Dressing or bathing? N  Doing errands, shopping? N  Preparing Food and eating ? N  Using the Toilet? N  In the past six months, have you accidently leaked urine? Y  Comment takes medication  Do you have problems with loss of bowel control? N  Managing your Medications? N  Managing your Finances? N  Housekeeping or managing your Housekeeping? N  Some recent data might be hidden    Patient Care Team: Jinny Sanders, MD as PCP - General (Family Medicine)  Indicate any recent Medical Services you may have received from other than [facility] providers in the past year (date may be approximate).     Assessment:   This is a routine wellness examination for Daiki.  Hearing/Vision screen  Hearing Screening   _0  _1  _2  _3  _4  _5  _6  _7  _8   Right ear:           Left ear:           Vision Screening Comments: Advised patient to get annual eye exams.  Dietary issues and exercise activities discussed: Current Exercise Habits: The patient does not participate in regular exercise at present, Exercise limited by: None identified  Goals    . Patient Stated     09/21/2020, I will maintain and continue medications as prescribed.      Depression Screen PHQ 2/9 Scores 09/21/2020 05/26/2020 07/12/2018 03/14/2016 11/26/2015 06/15/2015 05/26/2015  PHQ - 2 Score _9 0 0 0  PHQ- 9 Score _10 - - - -    Fall Risk Fall Risk  09/21/2020 05/26/2020 03/14/2016 11/26/2015 06/15/2015  Falls in the past year? 0 0 Yes No No  Number falls in past yr: 0 - 1 - -  Injury with Fall? 0 - No - -  Risk for fall due to : Medication side effect - - - -   Follow up Falls evaluation completed;Falls prevention discussed - Falls  evaluation completed - -    FALL RISK PREVENTION PERTAINING TO THE HOME:  Any stairs in or around the home? Yes  If so, are there any without handrails? No  Home free of loose throw rugs in walkways, pet beds, electrical cords, etc? Yes  Adequate lighting in your home to reduce risk of falls? Yes   ASSISTIVE DEVICES UTILIZED TO PREVENT FALLS:  Life alert? No  Use of a cane, walker or w/c? No  Grab bars in the bathroom? No  Shower chair or bench in shower? No  Elevated toilet seat or a handicapped toilet? No   TIMED UP AND GO:  Was the test performed? N/A telephone visit .   Cognitive Function: MMSE - Mini Mental State Exam 09/21/2020  Not completed: Refused       Mini Cog  Mini-Cog screen was not completed. Patient refused. Maximum score is 22. A value of 0 denotes this part of the MMSE was not completed or the patient failed this part of the Mini-Cog screening.  Immunizations Immunization History  Administered Date(s) Administered  . Influenza,inj,Quad PF,6+ Mos 03/26/2017, 03/27/2018, 03/07/2019  . Pneumococcal Polysaccharide-23 03/17/2017    TDAP status: Up to date  Flu Vaccine status: due Fall 2022  Pneumococcal vaccine status: Up to date  Covid-19 vaccine status: Declined, Education has been provided regarding the importance of this vaccine but patient still declined. Advised may receive this vaccine at local pharmacy or Health Dept.or vaccine clinic. Aware to provide a copy of the vaccination record if obtained from local pharmacy or Health Dept. Verbalized acceptance and understanding.  Qualifies for Shingles Vaccine? Yes   Zostavax completed No   Shingrix Completed?: No.    Education has been provided regarding the importance of this vaccine. Patient has been advised to call insurance company to determine out of pocket expense if they have not yet received this vaccine. Advised may also  receive vaccine at local pharmacy or Health Dept. Verbalized acceptance and understanding.  Screening Tests Health Maintenance  Topic Date Due  . COLONOSCOPY (Pts 45-42yr Insurance coverage will need to be confirmed)  Never done  . OPHTHALMOLOGY EXAM  06/13/2015  . URINE MICROALBUMIN  01/04/2017  . COVID-19 Vaccine (1) 10/07/2021 (Originally 05/15/1961)  . HEMOGLOBIN A1C  10/19/2020  . INFLUENZA VACCINE  12/27/2020  . FOOT EXAM  07/27/2021  . TETANUS/TDAP  05/29/2022  . PNEUMOCOCCAL POLYSACCHARIDE VACCINE AGE 62-64 HIGH RISK  Completed  . Hepatitis C Screening  Completed  . HIV Screening  Completed  . HPV VACCINES  Aged Out    Health Maintenance  Health Maintenance Due  Topic Date Due  . COLONOSCOPY (Pts 45-459yrInsurance coverage will need to be confirmed)  Never done  . OPHTHALMOLOGY EXAM  06/13/2015  . URINE MICROALBUMIN  01/04/2017    Colorectal cancer screening: due, will discuss with provider at physical   Lung Cancer Screening: (Low Dose CT Chest recommended if Age 63-80ears, 30 pack-year currently smoking OR have quit w/in 15years.) does not qualify.   Additional Screening:  Hepatitis C Screening: does qualify; Completed 05/26/2015  Vision Screening: Recommended annual ophthalmology exams for early detection of glaucoma and other disorders of the eye. Is the patient up to date with their annual eye exam?  No  Who is the provider or what is the name of the office in which the patient attends annual eye exams? Does not see eye doctor on regular basis If pt is not established with a provider, would they like  to be referred to a provider to establish care? No .   Dental Screening: Recommended annual dental exams for proper oral hygiene  Community Resource Referral / Chronic Care Management: CRR required this visit?  No   CCM required this visit?  No      Plan:     I have personally reviewed and noted the following in the patient's chart:   . Medical and  social history . Use of alcohol, tobacco or illicit drugs  . Current medications and supplements . Functional ability and status . Nutritional status . Physical activity . Advanced directives . List of other physicians . Hospitalizations, surgeries, and ER visits in previous 12 months . Vitals . Screenings to include cognitive, depression, and falls . Referrals and appointments  In addition, I have reviewed and discussed with patient certain preventive protocols, quality metrics, and best practice recommendations. A written personalized care plan for preventive services as well as general preventive health recommendations were provided to patient.   Due to this being a telephonic visit, the after visit summary with patients personalized plan was offered to patient via office or my-chart. Patient preferred to pick up at office at next visit or via mychart.   Andrez Grime, LPN   [id number]

## 2020-09-21 NOTE — Progress Notes (Signed)
PCP notes:  Health Maintenance: Covid- declined Colonoscopy- due Eye exam- due   Abnormal Screenings: none   Patient concerns: none   Nurse concerns: none   Next PCP appt.: 10/12/2020 @ 2:20 pm

## 2020-09-21 NOTE — Patient Instructions (Addendum)
Jonathon Graham , Thank you for taking time to come for your Medicare Wellness Visit. I appreciate your ongoing commitment to your health goals. Please review the following plan we discussed and let me know if I can assist you in the future.   Screening recommendations/referrals: Colonoscopy: due, will discuss with provider at physical  Recommended yearly ophthalmology/optometry visit for glaucoma screening and checkup Recommended yearly dental visit for hygiene and checkup  Vaccinations: Influenza vaccine: due Fall 2022 Pneumococcal vaccine: Up to date, completed 03/17/2017, due at age 65  Tdap vaccine: Up to date, completed 04/28/2013, due 04/2023 Shingles vaccine: due, check with your insurance regarding coverage if interested   Covid-19: declined  Advanced directives:Please bring a copy of your POA (Power of [place]) and/or Living Will to your next appointment.   Conditions/risks identified: diabetes, hyperlipidemia   Next appointment: Follow up in one year for your annual wellness visit.   Preventive Care 63-11 Year Old, Male Preventive care refers to lifestyle choices and visits with your health care provider that can promote health and wellness. What does preventive care include?  A yearly physical exam. This is also called an annual well check.  Dental exams once or twice a year.  Routine eye exams. Ask your health care provider how often you should have your eyes checked.  Personal lifestyle choices, including:  Daily care of your teeth and gums.  Regular physical activity.  Eating a healthy diet.  Avoiding tobacco and drug use.  Limiting alcohol use.  Practicing safe sex.  Taking low doses of aspirin every day.  Taking vitamin and mineral supplements as recommended by your health care provider. What happens during an annual well check? The services and screenings done by your health care provider during your annual well check will depend on your age, overall  health, lifestyle risk factors, and family history of disease. Counseling  Your health care provider may ask you questions about your:  Alcohol use.  Tobacco use.  Drug use.  Emotional well-being.  Home and relationship well-being.  Sexual activity.  Eating habits.  History of falls.  Memory and ability to understand (cognition).  Work and work Astronomer. Screening  You may have the following tests or measurements:  Height, weight, and BMI.  Blood pressure.  Lipid and cholesterol levels. These may be checked every 5 years, or more frequently if you are over 19 years old.  Skin check.  Lung cancer screening. You may have this screening every year starting at age 18 if you have a 30-pack-year history of smoking and currently smoke or have quit within the past 15 years.  Fecal occult blood test (FOBT) of the stool. You may have this test every year starting at age 23.  Flexible sigmoidoscopy or colonoscopy. You may have a sigmoidoscopy every 5 years or a colonoscopy every 10 years starting at age 76.  Prostate cancer screening. Recommendations will vary depending on your family history and other risks.  Hepatitis C blood test.  Hepatitis B blood test.  Sexually transmitted disease (STD) testing.  Diabetes screening. This is done by checking your blood sugar (glucose) after you have not eaten for a while (fasting). You may have this done every 1-3 years.  Abdominal aortic aneurysm (AAA) screening. You may need this if you are a current or former smoker.  Osteoporosis. You may be screened starting at age 27 if you are at high risk. Talk with your health care provider about your test results, treatment options, and if necessary,  the need for more tests. Vaccines  Your health care provider may recommend certain vaccines, such as:  Influenza vaccine. This is recommended every year.  Tetanus, diphtheria, and acellular pertussis (Tdap, Td) vaccine. You may need a Td  booster every 10 years.  Zoster vaccine. You may need this after age 53.  Pneumococcal 13-valent conjugate (PCV13) vaccine. One dose is recommended after age 70.  Pneumococcal polysaccharide (PPSV23) vaccine. One dose is recommended after age 14. Talk to your health care provider about which screenings and vaccines you need and how often you need them. This information is not intended to replace advice given to you by your health care provider. Make sure you discuss any questions you have with your health care provider. Document Released: 06/11/2015 Document Revised: 02/02/2016 Document Reviewed: 03/16/2015 Elsevier Interactive Patient Education  2017 [place] Prevention in the Home Falls can cause injuries. They can happen to people of all ages. There are many things you can do to make your home safe and to help prevent falls. What can I do on the outside of my home?  Regularly fix the edges of walkways and driveways and fix any cracks.  Remove anything that might make you trip as you walk through a door, such as a raised step or threshold.  Trim any bushes or trees on the path to your home.  Use bright outdoor lighting.  Clear any walking paths of anything that might make someone trip, such as rocks or tools.  Regularly check to see if handrails are loose or broken. Make sure that both sides of any steps have handrails.  Any raised decks and porches should have guardrails on the edges.  Have any leaves, snow, or ice cleared regularly.  Use sand or salt on walking paths during winter.  Clean up any spills in your garage right away. This includes oil or grease spills. What can I do in the bathroom?  Use night lights.  Install grab bars by the toilet and in the tub and shower. Do not use towel bars as grab bars.  Use non-skid mats or decals in the tub or shower.  If you need to sit down in the shower, use a plastic, non-slip stool.  Keep the floor dry. Clean up  any water that spills on the floor as soon as it happens.  Remove soap buildup in the tub or shower regularly.  Attach bath mats securely with double-sided non-slip rug tape.  Do not have throw rugs and other things on the floor that can make you trip. What can I do in the bedroom?  Use night lights.  Make sure that you have a light by your bed that is easy to reach.  Do not use any sheets or blankets that are too big for your bed. They should not hang down onto the floor.  Have a firm chair that has side arms. You can use this for support while you get dressed.  Do not have throw rugs and other things on the floor that can make you trip. What can I do in the kitchen?  Clean up any spills right away.  Avoid walking on wet floors.  Keep items that you use a lot in easy-to-reach places.  If you need to reach something above you, use a strong step stool that has a grab bar.  Keep electrical cords out of the way.  Do not use floor polish or wax that makes floors slippery. If you must use  wax, use non-skid floor wax.  Do not have throw rugs and other things on the floor that can make you trip. What can I do with my stairs?  Do not leave any items on the stairs.  Make sure that there are handrails on both sides of the stairs and use them. Fix handrails that are broken or loose. Make sure that handrails are as long as the stairways.  Check any carpeting to make sure that it is firmly attached to the stairs. Fix any carpet that is loose or worn.  Avoid having throw rugs at the top or bottom of the stairs. If you do have throw rugs, attach them to the floor with carpet tape.  Make sure that you have a light switch at the top of the stairs and the bottom of the stairs. If you do not have them, ask someone to add them for you. What else can I do to help prevent falls?  Wear shoes that:  Do not have high heels.  Have rubber bottoms.  Are comfortable and fit you well.  Are  closed at the toe. Do not wear sandals.  If you use a stepladder:  Make sure that it is fully opened. Do not climb a closed stepladder.  Make sure that both sides of the stepladder are locked into place.  Ask someone to hold it for you, if possible.  Clearly mark and make sure that you can see:  Any grab bars or handrails.  First and last steps.  Where the edge of each step is.  Use tools that help you move around (mobility aids) if they are needed. These include:  Canes.  Walkers.  Scooters.  Crutches.  Turn on the lights when you go into a dark area. Replace any light bulbs as soon as they burn out.  Set up your furniture so you have a clear path. Avoid moving your furniture around.  If any of your floors are uneven, fix them.  If there are any pets around you, be aware of where they are.  Review your medicines with your doctor. Some medicines can make you feel dizzy. This can increase your chance of falling. Ask your doctor what other things that you can do to help prevent falls. This information is not intended to replace advice given to you by your health care provider. Make sure you discuss any questions you have with your health care provider. Document Released: 03/11/2009 Document Revised: 10/21/2015 Document Reviewed: 06/19/2014 Elsevier Interactive Patient Education  2017 [employer].

## 2020-09-24 DIAGNOSIS — U071 COVID-19: Secondary | ICD-10-CM | POA: Diagnosis not present

## 2020-09-24 DIAGNOSIS — Z20822 Contact with and (suspected) exposure to covid-19: Secondary | ICD-10-CM | POA: Diagnosis not present

## 2020-09-25 DIAGNOSIS — J449 Chronic obstructive pulmonary disease, unspecified: Secondary | ICD-10-CM | POA: Diagnosis not present

## 2020-09-27 ENCOUNTER — Other Ambulatory Visit: Payer: Self-pay

## 2020-09-27 DIAGNOSIS — E1142 Type 2 diabetes mellitus with diabetic polyneuropathy: Secondary | ICD-10-CM

## 2020-09-27 DIAGNOSIS — Z9189 Other specified personal risk factors, not elsewhere classified: Secondary | ICD-10-CM | POA: Diagnosis not present

## 2020-09-27 DIAGNOSIS — U071 COVID-19: Secondary | ICD-10-CM | POA: Diagnosis not present

## 2020-09-27 MED ORDER — ATORVASTATIN CALCIUM 20 MG PO TABS: 1.0000 | ORAL_TABLET | Freq: Every day | ORAL | 0 refills | Status: DC

## 2020-09-28 ENCOUNTER — Encounter: Payer: Medicare HMO | Admitting: Family Medicine

## 2020-09-29 DIAGNOSIS — Z9189 Other specified personal risk factors, not elsewhere classified: Secondary | ICD-10-CM | POA: Diagnosis not present

## 2020-09-29 DIAGNOSIS — U071 COVID-19: Secondary | ICD-10-CM | POA: Diagnosis not present

## 2020-10-01 ENCOUNTER — Telehealth: Payer: Self-pay

## 2020-10-01 ENCOUNTER — Other Ambulatory Visit: Payer: Self-pay

## 2020-10-01 DIAGNOSIS — E1142 Type 2 diabetes mellitus with diabetic polyneuropathy: Secondary | ICD-10-CM

## 2020-10-01 MED ORDER — VITAMIN D3 1.25 MG (50000 UT) PO CAPS: ORAL_CAPSULE | ORAL | 0 refills | Status: AC

## 2020-10-01 MED ORDER — ATORVASTATIN CALCIUM 20 MG PO TABS: 1.0000 | ORAL_TABLET | Freq: Every day | ORAL | 0 refills | Status: DC

## 2020-10-01 NOTE — Telephone Encounter (Signed)
Has upcoming CPE with you... Please advise

## 2020-10-01 NOTE — Telephone Encounter (Signed)
Pharmacy requests refill on: Vitamin D3 50,000 IU   LAST REFILL: 12/06/2019 LAST OV: 07/27/2020 NEXT OV: 10/12/2020 PHARMACY: [id number] [place], [place]

## 2020-10-04 DIAGNOSIS — Z20822 Contact with and (suspected) exposure to covid-19: Secondary | ICD-10-CM | POA: Diagnosis not present

## 2020-10-04 DIAGNOSIS — U099 Post covid-19 condition, unspecified: Secondary | ICD-10-CM | POA: Diagnosis not present

## 2020-10-04 DIAGNOSIS — Z1159 Encounter for screening for other viral diseases: Secondary | ICD-10-CM | POA: Diagnosis not present

## 2020-10-04 DIAGNOSIS — I77810 Thoracic aortic ectasia: Secondary | ICD-10-CM | POA: Diagnosis not present

## 2020-10-07 ENCOUNTER — Telehealth: Payer: Self-pay

## 2020-10-07 DIAGNOSIS — Z87891 Personal history of nicotine dependence: Secondary | ICD-10-CM | POA: Diagnosis not present

## 2020-10-07 DIAGNOSIS — I77810 Thoracic aortic ectasia: Secondary | ICD-10-CM | POA: Diagnosis not present

## 2020-10-07 NOTE — Telephone Encounter (Signed)
Will review when arrives.

## 2020-10-07 NOTE — Telephone Encounter (Signed)
Patient called and stated that he had labs done in [place] last Monday and wanted to know if Dr. Ermalene Searing would accept those instead of getting labs drawn again. Instructed patient to have that office fax the lab results to [place]. Patient verbalized understanding. Will cancel lab appointment for tomorrow per patient request.

## 2020-10-08 ENCOUNTER — Telehealth: Payer: Self-pay

## 2020-10-08 ENCOUNTER — Other Ambulatory Visit: Payer: Medicare HMO

## 2020-10-08 NOTE — Telephone Encounter (Signed)
Pt came to clinic and called from outside asking if he should be here to have labs drawn. Advised pt it is ok to have labs from [facility] faxed here and if any further labs are needed they can be drawn at his apt with Dr. Ermalene Searing on 10/12/20. Pt reports he was positive for COVID on 4/28 but has tested negative since and is doing well with no symptoms currently. Pt will contact [facility] today and have them fax the results. Advised if anything changes before his apt to contact this office. Pt verbalized understanding and was appreciative.

## 2020-10-08 NOTE — Telephone Encounter (Signed)
[facility] Night - Client Nonclinical Telephone Record AccessNurse Client [facility] Night - Client Client Site  [facility] [place] - Night Physician Kerby Nora - MD Contact Type Call Who Is Calling Patient / Member / Family / Caregiver Caller Name Jonathon Graham Caller Phone Number [phone] Patient Name Jonathon Graham Patient DOB 1955-11-19 Call Type Message Only Information Provided Reason for Call Request to Reschedule Office Appointment Initial Comment Caller states her husband has an appt. today and is running late . Wants to know if he can schedule for later. Patient request to speak to RN No Additional Comment hrs prov Disp. Time Disposition Final User 10/08/2020 7:17:47 AM General Information Provided Yes Pearletha Furl Call Closed By: Pearletha Furl Transaction Date/Time: 10/08/2020 7:15:08 AM (ET)

## 2020-10-11 DIAGNOSIS — I77810 Thoracic aortic ectasia: Secondary | ICD-10-CM | POA: Diagnosis not present

## 2020-10-12 ENCOUNTER — Other Ambulatory Visit: Payer: Self-pay | Admitting: Family Medicine

## 2020-10-12 ENCOUNTER — Encounter: Payer: Medicare HMO | Admitting: Family Medicine

## 2020-10-13 NOTE — Telephone Encounter (Signed)
Pharmacy requests refill on: Hydrochlorothiazide 12.5 mg   LAST REFILL: 07/19/2020 (Q-90, R-0) LAST OV: 07/27/2020 NEXT OV: 10/19/2020 PHARMACY: [id number] [place], [place]

## 2020-10-19 ENCOUNTER — Ambulatory Visit (INDEPENDENT_AMBULATORY_CARE_PROVIDER_SITE_OTHER): Payer: Medicare HMO | Admitting: Family Medicine

## 2020-10-19 ENCOUNTER — Other Ambulatory Visit: Payer: Self-pay

## 2020-10-19 ENCOUNTER — Encounter: Payer: Self-pay | Admitting: Family Medicine

## 2020-10-19 VITALS — BP 100/72 | HR 55 | Temp 97.7°F | Ht 67.5 in | Wt 297.0 lb

## 2020-10-19 DIAGNOSIS — J439 Emphysema, unspecified: Secondary | ICD-10-CM | POA: Diagnosis not present

## 2020-10-19 DIAGNOSIS — F322 Major depressive disorder, single episode, severe without psychotic features: Secondary | ICD-10-CM | POA: Diagnosis not present

## 2020-10-19 DIAGNOSIS — Z0000 Encounter for general adult medical examination without abnormal findings: Secondary | ICD-10-CM

## 2020-10-19 DIAGNOSIS — M48061 Spinal stenosis, lumbar region without neurogenic claudication: Secondary | ICD-10-CM | POA: Diagnosis not present

## 2020-10-19 DIAGNOSIS — I152 Hypertension secondary to endocrine disorders: Secondary | ICD-10-CM

## 2020-10-19 DIAGNOSIS — M25552 Pain in left hip: Secondary | ICD-10-CM | POA: Diagnosis not present

## 2020-10-19 DIAGNOSIS — R351 Nocturia: Secondary | ICD-10-CM

## 2020-10-19 DIAGNOSIS — E662 Morbid (severe) obesity with alveolar hypoventilation: Secondary | ICD-10-CM

## 2020-10-19 DIAGNOSIS — E1159 Type 2 diabetes mellitus with other circulatory complications: Secondary | ICD-10-CM

## 2020-10-19 DIAGNOSIS — Z125 Encounter for screening for malignant neoplasm of prostate: Secondary | ICD-10-CM | POA: Diagnosis not present

## 2020-10-19 DIAGNOSIS — I10 Essential (primary) hypertension: Secondary | ICD-10-CM

## 2020-10-19 DIAGNOSIS — E1149 Type 2 diabetes mellitus with other diabetic neurological complication: Secondary | ICD-10-CM

## 2020-10-19 DIAGNOSIS — N401 Enlarged prostate with lower urinary tract symptoms: Secondary | ICD-10-CM

## 2020-10-19 DIAGNOSIS — E1142 Type 2 diabetes mellitus with diabetic polyneuropathy: Secondary | ICD-10-CM

## 2020-10-19 DIAGNOSIS — E1169 Type 2 diabetes mellitus with other specified complication: Secondary | ICD-10-CM

## 2020-10-19 DIAGNOSIS — E785 Hyperlipidemia, unspecified: Secondary | ICD-10-CM

## 2020-10-19 DIAGNOSIS — I77810 Thoracic aortic ectasia: Secondary | ICD-10-CM

## 2020-10-19 MED ORDER — FUROSEMIDE 40 MG PO TABS
40.0000 mg | ORAL_TABLET | Freq: Every day | ORAL | 3 refills | Status: DC
Start: 2020-10-19 — End: 2021-07-04

## 2020-10-19 MED ORDER — GABAPENTIN 300 MG PO CAPS
300.0000 mg | ORAL_CAPSULE | Freq: Three times a day (TID) | ORAL | 0 refills | Status: DC
Start: 2020-10-19 — End: 2021-03-01

## 2020-10-19 MED ORDER — OXYCODONE-ACETAMINOPHEN 5-325 MG PO TABS: 1.0000 | ORAL_TABLET | Freq: Two times a day (BID) | ORAL | 0 refills | Status: DC | PRN

## 2020-10-19 MED ORDER — TAMSULOSIN HCL 0.4 MG PO CAPS: 0.4000 mg | ORAL_CAPSULE | Freq: Every day | ORAL | 3 refills | Status: DC

## 2020-10-19 NOTE — Patient Instructions (Addendum)
Bring in CT scan report from [facility]. Keep working on healthy eating, weight loss and regular exercise. Set up yearly eye exam for diabetes and have the opthalmologist send [place] a copy of the evaluation for the chart.   Please stop at the lab to have labs drawn.  Complete and return Cologuard.

## 2020-10-19 NOTE — Progress Notes (Signed)
Patient ID: Jonathon Graham, male    DOB: 02/24/1956, 65 y.o.   MRN: [id number]  This visit was conducted in person.  BP 100/72   Pulse (!) 55   Temp 97.7 F (36.5 C) (Temporal)   Ht 5' 7.5" (1.715 m)   Wt 297 lb (134.7 kg)   SpO2 94%   BMI 45.83 kg/m    CC:  Chief Complaint  Patient presents with   Annual Exam    Part 2    Subjective:   HPI: Jonathon Graham is a 65 y.o. male presenting on 10/19/2020 for Annual Exam (Part 2)  The patient presents for  complete physical and review of chronic health problems. He/She also has the following acute concerns today: none  The patient saw a LPN or RN for medicare wellness visit.  Prevention and wellness was reviewed in detail. Note reviewed and important notes copied below. Health Maintenance: Covid- declined Colonoscopy- due Eye exam- due     Abnormal Screenings: none    10/19/20  COPD: improved control after recent  COVID infection.  MDD: stable  Depression screen [facility] 2/9 09/21/2020 05/26/2020 07/12/2018  Decreased Interest '2 1 3  ' Down, Depressed, Hopeless '2 1 3  ' PHQ - 2 Score '4 2 6  ' Altered sleeping 0 1 3  Tired, decreased energy 0 1 3  Change in appetite 0 1 3  Feeling bad or failure about yourself  0 1 3  Trouble concentrating 0 1 2  Moving slowly or fidgety/restless 0 1 2  Suicidal thoughts 0 0 0  PHQ-9 Score '4 8 22  ' Difficult doing work/chores Somewhat difficult - Somewhat difficult     Obesity and obesity hyperventilation syndrome, OSA:  Body mass index is 45.83 kg/m. Wt Readings from Last 3 Encounters:  10/19/20 297 lb (134.7 kg)  07/27/20 (!) 310 lb 8 oz (140.8 kg)  05/26/20 (!) 311 lb 4.8 oz (141.2 kg)    Diabetes:  Borderline control on metformin 1000 mg BID in past.. Due for re-eval. Hypoglycemic episodes: Hyperglycemic episodes: Feet problems: associated with neuropathy, no culcers Blood Sugars averaging: FBS 100-143 eye exam within last year: due  Elevated Cholesterol: Due for  re-eval. Using medications without problems: Muscle aches:  Diet compliance: working on low carb diet Exercise: now walking 3 times a week. Other complaints:  Hypertension:  At goal  HCTZ 12.5 mg BP Readings from Last 3 Encounters:  10/19/20 100/72  07/27/20 106/68  04/21/20 118/70  Using medication without problems or lightheadedness:  Chest pain with exertion: Edema: Short of breath: Average home BPs: Other issues:       Relevant past medical, surgical, family and social history reviewed and updated as indicated. Interim medical history since our last visit reviewed. Allergies and medications reviewed and updated. Outpatient Medications Prior to Visit  Medication Sig Dispense Refill   acetaminophen (TYLENOL) 500 MG tablet Take 1 tablet (500 mg total) by mouth every 8 (eight) hours as needed. 30 tablet 0   albuterol (PROAIR HFA) 108 (90 Base) MCG/ACT inhaler Inhale 1-2 puffs into the lungs every 4 (four) hours as needed for wheezing or shortness of breath. 1 Inhaler 5   albuterol (PROVENTIL) (2.5 MG/3ML) 0.083% nebulizer solution Take 3 mLs (2.5 mg total) by nebulization every 6 (six) hours as needed for wheezing or shortness of breath. 360 mL 6   aspirin 81 MG tablet Take 243 mg by mouth daily. Takes 4 68m aspirin daily.     atorvastatin (LIPITOR) 20 MG tablet  Take 1 tablet (20 mg total) by mouth daily. 90 tablet 0   betamethasone valerate ointment (VALISONE) 0.1 % Apply 1 application topically 2 (two) times daily. For up to 10 days. 30 g 0   blood glucose meter kit and supplies KIT Dispense based on patient and insurance preference. Use up to four times daily as directed. (FOR ICD-9 250.00, 250.01). 1 each 0   Cholecalciferol (VITAMIN D3) 1.25 MG (50000 UT) CAPS TAKE 1 CAPSULE BY MOUTH  ONCE A WEEK 12 capsule 0   clotrimazole (LOTRIMIN) 1 % cream Apply 1 application 2 (two) times daily topically. For 7 days 28 g 0   DULoxetine (CYMBALTA) 60 MG capsule Take 1 capsule (60 mg  total) by mouth daily. 90 capsule 1   Elastic Bandages & Supports (MEDICAL COMPRESSION SOCKS) MISC 1 each by Does not apply route daily. 1 each 0   Fluticasone-Salmeterol (ADVAIR DISKUS) 250-50 MCG/DOSE AEPB Inhale 1 puff into the lungs 2 (two) times daily. 180 each 3   furosemide (LASIX) 40 MG tablet Take 1 tablet by mouth once daily 90 tablet 1   gabapentin (NEURONTIN) 300 MG capsule Take 1 capsule (300 mg total) by mouth 3 (three) times daily. 270 capsule 0   hydrochlorothiazide (MICROZIDE) 12.5 MG capsule Take 1 capsule by mouth once daily 90 capsule 0   ibuprofen (ADVIL) 800 MG tablet TAKE 1 TABLET BY MOUTH EVERY 8 HOURS AS NEEDED 60 tablet 0   metFORMIN (GLUCOPHAGE) 1000 MG tablet TAKE 1 TABLET BY MOUTH TWICE DAILY WITH A MEAL 180 tablet 1   Misc. Devices (WRIST BRACE) MISC 2 Units by Does not apply route. Wear on wrists as needed for support     ondansetron (ZOFRAN) 8 MG tablet Take 1 tablet (8 mg total) by mouth every 8 (eight) hours as needed for nausea or vomiting. 12 tablet 0   oxyCODONE-acetaminophen (PERCOCET/ROXICET) 5-325 MG tablet Take 1 tablet by mouth 2 (two) times daily as needed for severe pain. 10 tablet 0   tamsulosin (FLOMAX) 0.4 MG CAPS capsule Take 1 capsule by mouth once daily 90 capsule 0   No facility-administered medications prior to visit.     Per HPI unless specifically indicated in ROS section below Review of Systems  Constitutional:  Negative for fatigue and fever.  HENT:  Negative for ear pain.   Eyes:  Negative for pain.  Respiratory:  Negative for cough and shortness of breath.   Cardiovascular:  Negative for chest pain, palpitations and leg swelling.  Gastrointestinal:  Negative for abdominal pain.  Genitourinary:  Negative for dysuria.  Musculoskeletal:  Negative for arthralgias.  Neurological:  Negative for syncope, light-headedness and headaches.  Psychiatric/Behavioral:  Negative for dysphoric mood.   Objective:  BP 100/72   Pulse (!) 55   Temp  97.7 F (36.5 C) (Temporal)   Ht 5' 7.5" (1.715 m)   Wt 297 lb (134.7 kg)   SpO2 94%   BMI 45.83 kg/m   Wt Readings from Last 3 Encounters:  10/19/20 297 lb (134.7 kg)  07/27/20 (!) 310 lb 8 oz (140.8 kg)  05/26/20 (!) 311 lb 4.8 oz (141.2 kg)      Physical Exam Constitutional:      Appearance: He is well-developed. He is obese.  HENT:     Head: Normocephalic.     Right Ear: Hearing normal.     Left Ear: Hearing normal.     Nose: Nose normal.  Neck:     Thyroid: No thyroid mass  or thyromegaly.     Vascular: No carotid bruit.     Trachea: Trachea normal.  Cardiovascular:     Rate and Rhythm: Normal rate and regular rhythm.     Pulses: Normal pulses.     Heart sounds: Heart sounds not distant. No murmur heard.   No friction rub. No gallop.     Comments: No peripheral edema Pulmonary:     Effort: Pulmonary effort is normal. No respiratory distress.     Breath sounds: Normal breath sounds.  Skin:    General: Skin is warm and dry.     Findings: No rash.  Psychiatric:        Speech: Speech normal.        Behavior: Behavior normal.        Thought Content: Thought content normal.      Results for orders placed or performed in visit on 07/27/20  HM DIABETES FOOT EXAM  Result Value Ref Range   HM Diabetic Foot Exam done     This visit occurred during the SARS-CoV-2 public health emergency.  Safety protocols were in place, including screening questions prior to the visit, additional usage of staff PPE, and extensive cleaning of exam room while observing appropriate contact time as indicated for disinfecting solutions.   COVID 19 screen:  No recent travel or known exposure to COVID19 The patient denies respiratory symptoms of COVID 19 at this time. The importance of social distancing was discussed today.   Assessment and Plan   The patient's preventative maintenance and recommended screening tests for an annual wellness exam were reviewed in full today. Brought up to  date unless services declined.  Counselled on the importance of diet, exercise, and its role in overall health and mortality. The patient's FH and SH was reviewed, including their home life, tobacco status, and drug and alcohol status.    Vaccines: Uptodate with PNA,  Discussed COVID19 vaccine side effects and benefits. Strongly encouraged the patient to get the vaccine. Questions answered. Due for tdap and shingrix... he thinks he may have had td in 2015. Prostate Cancer Screen: due for eval. Colon Cancer Screen:  Cologuard      Smoking Status: former ETOH/ drug use: none/none  Hep C:  negative    Problem List Items Addressed This Visit     BPH (benign prostatic hyperplasia) (Chronic)    Stable control currently on tamsulosin. Refilled.       Relevant Medications   tamsulosin (FLOMAX) 0.4 MG CAPS capsule   COPD (chronic obstructive pulmonary disease) (HCC) (Chronic)    Stable, chronic.  Continue current medication.   Advair diskus 250/50 1 puffs  Twice daily       Depression, major, single episode, severe (HCC) (Chronic)    Stable, chronic.  Continue current medication.   Cymbalta 60 mg daily       Diabetic neuropathy (HCC) (Chronic)    Stable, chronic.  Continue current medication.   Gabapentin  300 mg TID       Hyperlipidemia associated with type 2 diabetes mellitus (HCC) (Chronic)    Due for re-eval.       Hypertension associated with diabetes (HCC) (Chronic)    Stable, chronic.  Continue current medication.   HCTZ 12.5 mg daily, lasix 40 mg daily prn       Relevant Medications   furosemide (LASIX) 40 MG tablet   Obesity hypoventilation syndrome (HCC) (Chronic)    Encouraged exercise, weight loss, healthy eating habits.  Type 2 diabetes, controlled, with peripheral neuropathy (HCC) (Chronic)    Due for re-eval.       Relevant Medications   gabapentin (NEURONTIN) 300 MG capsule   Other Visit Diagnoses     Routine general  medical examination at a health care facility    -  Primary   Essential hypertension       Relevant Medications   furosemide (LASIX) 40 MG tablet   Spinal stenosis of lumbar region, unspecified whether neurogenic claudication present       Left hip pain       Aortic root dilation (HCC)       Relevant Medications   furosemide (LASIX) 40 MG tablet   Prostate cancer screening          Meds ordered this encounter  Medications   gabapentin (NEURONTIN) 300 MG capsule    Sig: Take 1 capsule (300 mg total) by mouth 3 (three) times daily.    Dispense:  270 capsule    Refill:  0   furosemide (LASIX) 40 MG tablet    Sig: Take 1 tablet (40 mg total) by mouth daily.    Dispense:  90 tablet    Refill:  3   tamsulosin (FLOMAX) 0.4 MG CAPS capsule    Sig: Take 1 capsule (0.4 mg total) by mouth daily.    Dispense:  90 capsule    Refill:  3   DISCONTD: oxyCODONE-acetaminophen (PERCOCET/ROXICET) 5-325 MG tablet    Sig: Take 1 tablet by mouth 2 (two) times daily as needed for severe pain.    Dispense:  10 tablet    Refill:  0   Eliezer Lofts, MD

## 2020-10-19 NOTE — Addendum Note (Signed)
Addended by: Damita Lack on: 10/19/2020 04:30 PM   Modules accepted: Orders

## 2020-10-20 LAB — PSA, MEDICARE: PSA: 0.24 ng/ml (ref 0.10–4.00)

## 2020-10-20 LAB — HEMOGLOBIN A1C: Hgb A1c MFr Bld: 7.1 % — ABNORMAL HIGH (ref 4.6–6.5)

## 2020-10-20 LAB — LIPID PANEL
Cholesterol: 104 mg/dL (ref 0–200)
HDL: 39.8 mg/dL (ref >39.00)
LDL Cholesterol: 49 mg/dL (ref 0–99)
NonHDL: 64.5
Total CHOL/HDL Ratio: 3
Triglycerides: 79 mg/dL (ref 0.0–149.0)
VLDL: 15.8 mg/dL (ref 0.0–40.0)

## 2020-10-20 LAB — MICROALBUMIN / CREATININE URINE RATIO
Creatinine,U: 300.1 mg/dL
Microalb Creat Ratio: 0.5 mg/g (ref 0.0–30.0)
Microalb, Ur: 1.4 mg/dL (ref 0.0–1.9)

## 2020-10-25 DIAGNOSIS — J449 Chronic obstructive pulmonary disease, unspecified: Secondary | ICD-10-CM | POA: Diagnosis not present

## 2020-11-01 ENCOUNTER — Telehealth: Payer: Self-pay | Admitting: Family Medicine

## 2020-11-01 NOTE — Telephone Encounter (Signed)
Handicap Placard application placed in Dr. Daphine Deutscher office in box to complete.

## 2020-11-01 NOTE — Telephone Encounter (Signed)
Handicap placecard form in box to be completed. EM

## 2020-11-02 NOTE — Telephone Encounter (Signed)
Completed form in outbox.

## 2020-11-03 NOTE — Telephone Encounter (Signed)
Jonathon Graham notified by telephone that his handicap placard application is ready to be picked up at the front desk.

## 2020-11-12 DIAGNOSIS — Z1212 Encounter for screening for malignant neoplasm of rectum: Secondary | ICD-10-CM | POA: Diagnosis not present

## 2020-11-12 DIAGNOSIS — Z1211 Encounter for screening for malignant neoplasm of colon: Secondary | ICD-10-CM | POA: Diagnosis not present

## 2020-11-12 LAB — COLOGUARD: Cologuard: NEGATIVE

## 2020-11-15 ENCOUNTER — Other Ambulatory Visit: Payer: Self-pay | Admitting: Family Medicine

## 2020-11-15 DIAGNOSIS — G8929 Other chronic pain: Secondary | ICD-10-CM

## 2020-11-15 NOTE — Telephone Encounter (Signed)
Last office visit 10/19/2020 for CPE.  Last refilled 09/13/2020 for #60 with no refills. No future appointments with PCP.

## 2020-11-16 NOTE — Telephone Encounter (Signed)
I am covering for Dr. Kerby Nora who is away from the office today.

## 2020-11-18 LAB — COLOGUARD: COLOGUARD: NEGATIVE

## 2020-11-19 ENCOUNTER — Encounter: Payer: Self-pay | Admitting: Family Medicine

## 2020-11-25 DIAGNOSIS — J449 Chronic obstructive pulmonary disease, unspecified: Secondary | ICD-10-CM | POA: Diagnosis not present

## 2020-11-26 ENCOUNTER — Encounter: Payer: Self-pay | Admitting: Family Medicine

## 2020-11-26 ENCOUNTER — Other Ambulatory Visit: Payer: Self-pay

## 2020-11-26 ENCOUNTER — Ambulatory Visit (INDEPENDENT_AMBULATORY_CARE_PROVIDER_SITE_OTHER): Payer: Medicare HMO | Admitting: Family Medicine

## 2020-11-26 VITALS — BP 120/80 | HR 73 | Temp 98.0°F | Ht 67.5 in | Wt 295.5 lb

## 2020-11-26 DIAGNOSIS — M7989 Other specified soft tissue disorders: Secondary | ICD-10-CM | POA: Insufficient documentation

## 2020-11-26 DIAGNOSIS — M25552 Pain in left hip: Secondary | ICD-10-CM | POA: Diagnosis not present

## 2020-11-26 DIAGNOSIS — M48061 Spinal stenosis, lumbar region without neurogenic claudication: Secondary | ICD-10-CM | POA: Diagnosis not present

## 2020-11-26 DIAGNOSIS — M549 Dorsalgia, unspecified: Secondary | ICD-10-CM | POA: Diagnosis not present

## 2020-11-26 DIAGNOSIS — M79662 Pain in left lower leg: Secondary | ICD-10-CM | POA: Diagnosis not present

## 2020-11-26 MED ORDER — OXYCODONE-ACETAMINOPHEN 5-325 MG PO TABS: 0.5000 | ORAL_TABLET | Freq: Every day | ORAL | 0 refills | Status: DC | PRN

## 2020-11-26 MED ORDER — CYCLOBENZAPRINE HCL 10 MG PO TABS: 10.0000 mg | ORAL_TABLET | Freq: Every evening | ORAL | 0 refills | Status: DC | PRN

## 2020-11-26 NOTE — Patient Instructions (Addendum)
We will get you set up with  [place] of leg to evaluate for clot.  Start heat on right back.  Start back exercises to stretch.   Start flexeril as need for muscle spasm.

## 2020-11-26 NOTE — Progress Notes (Signed)
Patient ID: Jonathon Graham, male    DOB: 1955/11/24, 65 y.o.   MRN: [id number]  This visit was conducted in person.  BP 120/80   Pulse 73   Temp 98 F (36.7 C) (Temporal)   Ht 5' 7.5" (1.715 m)   Wt 295 lb 8 oz (134 kg)   SpO2 97%   BMI 45.60 kg/m    CC: Chief Complaint  Patient presents with   Ankle Pain    Left   Joint Swelling   Back Pain    Subjective:   HPI: Jonathon Graham is a 65 y.o. male presenting on 11/26/2020 for Ankle Pain (Left), Joint Swelling, and Back Pain   He was doing well until last night.  Last night had sudden pain in right posterior shoulder radiating to right  hip.  Aching pain in this area now... worse with moving.   No change in activity, fell 4 weeks ago.. fell on  back.. felt okay after this.  He has also noted swelling in left ankle and top of left foot... feels tight. Better when wakes up in AM.. worse at night. He is using fluid pills... lasix 40 mg daily. BP Readings from Last 3 Encounters:  11/26/20 120/80  10/19/20 100/72  07/27/20 106/68   Wt Readings from Last 3 Encounters:  11/26/20 295 lb 8 oz (134 kg)  10/19/20 297 lb (134.7 kg)  07/27/20 (!) 310 lb 8 oz (140.8 kg)       Had similar in 2018  Chronic back pain since work injury 2017 Now going to  PT.  Has back specialist at [facility] in past. On ibuprofen 800 mg once daily. Using oxycodone  1/2 tab once daily. Using 10 in 3-4 months  Fibromyalgia: On 300 mg twice daily gabapentin       Relevant past medical, surgical, family and social history reviewed and updated as indicated. Interim medical history since our last visit reviewed. Allergies and medications reviewed and updated. Outpatient Medications Prior to Visit  Medication Sig Dispense Refill   acetaminophen (TYLENOL) 500 MG tablet Take 1 tablet (500 mg total) by mouth every 8 (eight) hours as needed. 30 tablet 0   albuterol (PROAIR HFA) 108 (90 Base) MCG/ACT inhaler Inhale 1-2 puffs into the lungs every 4  (four) hours as needed for wheezing or shortness of breath. 1 Inhaler 5   albuterol (PROVENTIL) (2.5 MG/3ML) 0.083% nebulizer solution Take 3 mLs (2.5 mg total) by nebulization every 6 (six) hours as needed for wheezing or shortness of breath. 360 mL 6   aspirin 81 MG tablet Take 243 mg by mouth daily. Takes 4 2m aspirin daily.     atorvastatin (LIPITOR) 20 MG tablet Take 1 tablet (20 mg total) by mouth daily. 90 tablet 0   betamethasone valerate ointment (VALISONE) 0.1 % Apply 1 application topically 2 (two) times daily. For up to 10 days. 30 g 0   blood glucose meter kit and supplies KIT Dispense based on patient and insurance preference. Use up to four times daily as directed. (FOR ICD-9 250.00, 250.01). 1 each 0   Cholecalciferol (VITAMIN D3) 1.25 MG (50000 UT) CAPS TAKE 1 CAPSULE BY MOUTH  ONCE A WEEK 12 capsule 0   clotrimazole (LOTRIMIN) 1 % cream Apply 1 application 2 (two) times daily topically. For 7 days 28 g 0   DULoxetine (CYMBALTA) 60 MG capsule Take 1 capsule (60 mg total) by mouth daily. 90 capsule 1   Elastic Bandages & Supports (MEDICAL COMPRESSION  SOCKS) MISC 1 each by Does not apply route daily. 1 each 0   Fluticasone-Salmeterol (ADVAIR DISKUS) 250-50 MCG/DOSE AEPB Inhale 1 puff into the lungs 2 (two) times daily. 180 each 3   furosemide (LASIX) 40 MG tablet Take 1 tablet (40 mg total) by mouth daily. 90 tablet 3   gabapentin (NEURONTIN) 300 MG capsule Take 1 capsule (300 mg total) by mouth 3 (three) times daily. 270 capsule 0   hydrochlorothiazide (MICROZIDE) 12.5 MG capsule Take 1 capsule by mouth once daily 90 capsule 0   ibuprofen (ADVIL) 800 MG tablet TAKE 1 TABLET BY MOUTH EVERY 8 HOURS AS NEEDED 60 tablet 0   metFORMIN (GLUCOPHAGE) 1000 MG tablet TAKE 1 TABLET BY MOUTH TWICE DAILY WITH A MEAL 180 tablet 1   Misc. Devices (WRIST BRACE) MISC 2 Units by Does not apply route. Wear on wrists as needed for support     ondansetron (ZOFRAN) 8 MG tablet Take 1 tablet (8 mg total)  by mouth every 8 (eight) hours as needed for nausea or vomiting. 12 tablet 0   oxyCODONE-acetaminophen (PERCOCET/ROXICET) 5-325 MG tablet Take 1 tablet by mouth 2 (two) times daily as needed for severe pain. 10 tablet 0   tamsulosin (FLOMAX) 0.4 MG CAPS capsule Take 1 capsule (0.4 mg total) by mouth daily. 90 capsule 3   No facility-administered medications prior to visit.     Per HPI unless specifically indicated in ROS section below Review of Systems  All other systems reviewed and are negative. Objective:  BP 120/80   Pulse 73   Temp 98 F (36.7 C) (Temporal)   Ht 5' 7.5" (1.715 m)   Wt 295 lb 8 oz (134 kg)   SpO2 97%   BMI 45.60 kg/m   Wt Readings from Last 3 Encounters:  11/26/20 295 lb 8 oz (134 kg)  10/19/20 297 lb (134.7 kg)  07/27/20 (!) 310 lb 8 oz (140.8 kg)      Physical Exam Constitutional:      Appearance: He is well-developed. He is obese.  HENT:     Head: Normocephalic.     Right Ear: Hearing normal.     Left Ear: Hearing normal.     Nose: Nose normal.  Neck:     Thyroid: No thyroid mass or thyromegaly.     Vascular: No carotid bruit.     Trachea: Trachea normal.  Cardiovascular:     Rate and Rhythm: Normal rate and regular rhythm.     Pulses: Normal pulses.     Heart sounds: Heart sounds not distant. No murmur heard.   No friction rub. No gallop.     Comments: Right leg with 2 plus nonpitting  peripheral edema Pulmonary:     Effort: Pulmonary effort is normal. No respiratory distress.     Breath sounds: Normal breath sounds.  Musculoskeletal:     Right lower leg: Edema present.  Skin:    General: Skin is warm and dry.     Findings: No rash.  Psychiatric:        Speech: Speech normal.        Behavior: Behavior normal.        Thought Content: Thought content normal.      Results for orders placed or performed in visit on 11/19/20  Cologuard  Result Value Ref Range   Cologuard Negative Negative    This visit occurred during the SARS-CoV-2  public health emergency.  Safety protocols were in place, including screening questions prior  to the visit, additional usage of staff PPE, and extensive cleaning of exam room while observing appropriate contact time as indicated for disinfecting solutions.   COVID 19 screen:  No recent travel or known exposure to COVID19 The patient denies respiratory symptoms of COVID 19 at this time. The importance of social distancing was discussed today.   Assessment and Plan    Problem List Items Addressed This Visit     Acute right-sided back pain   Relevant Medications   cyclobenzaprine (FLEXERIL) 10 MG tablet   oxyCODONE-acetaminophen (PERCOCET/ROXICET) 5-325 MG tablet   Left hip pain   Relevant Medications   oxyCODONE-acetaminophen (PERCOCET/ROXICET) 5-325 MG tablet   Pain and swelling of lower leg, left - Primary   Relevant Orders   US Venous Img Lower Unilateral Left   Other Visit Diagnoses     Spinal stenosis of lumbar region, unspecified whether neurogenic claudication present       Relevant Medications   oxyCODONE-acetaminophen (PERCOCET/ROXICET) 5-325 MG tablet      Eval for DVT with [place] right leg. Likely back pain  and hip pain due to spinal stenosis vs sciatica.Marland Kitchen treat with  heat, gentle stretching ( info given) and muscle relaxant. Refilled narcotic. PDMP reviewed and no red flags. Consider further eval if not improving as expected.  Meds ordered this encounter  Medications   cyclobenzaprine (FLEXERIL) 10 MG tablet    Sig: Take 1 tablet (10 mg total) by mouth at bedtime as needed for muscle spasms.    Dispense:  15 tablet    Refill:  0   oxyCODONE-acetaminophen (PERCOCET/ROXICET) 5-325 MG tablet    Sig: Take 0.5 tablets by mouth daily as needed for severe pain.    Dispense:  15 tablet    Refill:  0   Orders Placed This Encounter  Procedures   US Venous Img Lower Unilateral Left    Standing Status:   Future    Standing Expiration Date:   11/26/2021    Order Specific  Question:   Reason for Exam (SYMPTOM  OR DIAGNOSIS REQUIRED)    Answer:   left lower leg pain and swelling    Order Specific Question:   Preferred imaging location?    Answer:   [facility]    Order Specific Question:   Call Results- Best Contact Number?    Answer:   [phone]     Eliezer Lofts, MD

## 2020-11-28 DIAGNOSIS — Z9981 Dependence on supplemental oxygen: Secondary | ICD-10-CM | POA: Diagnosis not present

## 2020-11-28 DIAGNOSIS — I440 Atrioventricular block, first degree: Secondary | ICD-10-CM | POA: Diagnosis not present

## 2020-11-28 DIAGNOSIS — E785 Hyperlipidemia, unspecified: Secondary | ICD-10-CM | POA: Diagnosis not present

## 2020-11-28 DIAGNOSIS — I712 Thoracic aortic aneurysm, without rupture: Secondary | ICD-10-CM | POA: Diagnosis not present

## 2020-11-28 DIAGNOSIS — R9431 Abnormal electrocardiogram [ECG] [EKG]: Secondary | ICD-10-CM | POA: Diagnosis not present

## 2020-11-28 DIAGNOSIS — I517 Cardiomegaly: Secondary | ICD-10-CM | POA: Diagnosis not present

## 2020-11-28 DIAGNOSIS — I4510 Unspecified right bundle-branch block: Secondary | ICD-10-CM | POA: Diagnosis not present

## 2020-11-28 DIAGNOSIS — R059 Cough, unspecified: Secondary | ICD-10-CM | POA: Diagnosis not present

## 2020-11-28 DIAGNOSIS — J189 Pneumonia, unspecified organism: Secondary | ICD-10-CM | POA: Diagnosis not present

## 2020-11-28 DIAGNOSIS — I4430 Unspecified atrioventricular block: Secondary | ICD-10-CM | POA: Diagnosis not present

## 2020-11-28 DIAGNOSIS — R531 Weakness: Secondary | ICD-10-CM | POA: Diagnosis not present

## 2020-11-28 DIAGNOSIS — R0603 Acute respiratory distress: Secondary | ICD-10-CM | POA: Diagnosis not present

## 2020-11-28 DIAGNOSIS — R0902 Hypoxemia: Secondary | ICD-10-CM | POA: Diagnosis not present

## 2020-11-28 DIAGNOSIS — J9621 Acute and chronic respiratory failure with hypoxia: Secondary | ICD-10-CM | POA: Diagnosis not present

## 2020-11-28 DIAGNOSIS — E119 Type 2 diabetes mellitus without complications: Secondary | ICD-10-CM | POA: Diagnosis not present

## 2020-11-28 DIAGNOSIS — J440 Chronic obstructive pulmonary disease with acute lower respiratory infection: Secondary | ICD-10-CM | POA: Diagnosis not present

## 2020-11-28 DIAGNOSIS — R0602 Shortness of breath: Secondary | ICD-10-CM | POA: Diagnosis not present

## 2020-11-28 DIAGNOSIS — I491 Atrial premature depolarization: Secondary | ICD-10-CM | POA: Diagnosis not present

## 2020-12-01 ENCOUNTER — Ambulatory Visit: Payer: Medicare HMO

## 2020-12-01 ENCOUNTER — Telehealth: Payer: Self-pay | Admitting: Family Medicine

## 2020-12-01 NOTE — Telephone Encounter (Signed)
Called to schedule patient for STAT Venous Doppler, spoke with wife and she stated the patient is way too weak to travel to the have this done today. I asked what was going on and she stated that he is very weak, coughing up blood. I asked if she has spoken with anyone today at our office and she advised that they have not called yet. I advised that I would call and speak with Dr Ermalene Searing and would set up for further triage of symptoms.   Spoke with Dr Ermalene Searing,  Pt seen in [place] at [facility] on 7/2 (SAT) and was transported via EMS to the ED [facility].  Pt was d/c home on Monday 7/4.  Pt diagnosed with PNA, Hemoptysis and placed on antibiotics. Pt is also on O2, very weak today not feeling well.   Per Dr Ermalene Searing we are going to hold off on the STAT US Venous Doppler LE -Need to get records from [facility] patient was admitted to -Need to further triage patient - requested that I send this message to Regina/Rena to call and triage the patient.  Wife [name] is aware that someone will be calling her regarding her husband.  **[place] appt cancelled**

## 2020-12-01 NOTE — Telephone Encounter (Signed)
Need to get record from hospitalization. ? If had CT scan on lungs there. As long as blood in sputum streaking and not heavy... given more time to improve on antibiotics.  From triage note.. sound like he is improving overall. No chest pain.   Make him a hospital follow up appt on Friday with me.  Will reassess need for [place] of leg at that point.

## 2020-12-01 NOTE — Telephone Encounter (Signed)
Mr & Mrs Tassinari notified as instructed by telephone.  Patient denies any chest pain.  Hospital follow up scheduled with Dr. Ermalene Searing 12/03/2020 at 3:00 pm.  Mrs. Castilleja states if the Mr. Honse is still weak she will have to call and reschedule.  She states he is barely able to walk around the house.  Records requested from [facility].  FYI to Dr. Ermalene Searing.

## 2020-12-01 NOTE — Telephone Encounter (Addendum)
I spoke with pt; pt is having a new weakness after pt got pneumonia while pt was in the hospital. No fever now.Pt was discharged home on 07-04/22 from hospital pt said he thinks he is a little bit better with weakness but is still to weak to move around much.Pt has 7 more days to be on abx  levosloxacin 500 mg. Taking once daily.Pt said prod cough that appears dark red or green. Pt said his wife has seen bright red blood when pt coughs up phlegm; last time wife thinks saw bright red blood in phlegm on 12/01/20. Pt said the hospital advised pt that the blood in the phlegm would go away after a few days of being on abx. Pt said he was coughing a lot more than pt is coughing now. Pt is presently on 2 L of oxygen when needed; last time pt used the oxygen was last night.pt said he does not feel in any distress with his breathing right now. Pt and wife are aware someone will call them back after reviewed by Dr Ermalene Searing. Lupita Leash CMA is texting Dr Ermalene Searing now, ED precautions given and pt voiced understanding.sending note to Dr Ermalene Searing and Lupita Leash CMA.

## 2020-12-03 ENCOUNTER — Ambulatory Visit: Payer: Medicare HMO | Admitting: Family Medicine

## 2020-12-06 NOTE — Assessment & Plan Note (Signed)
Stable, chronic.  Continue current medication.   Advair diskus 250/50 1 puffs  Twice daily

## 2020-12-06 NOTE — Assessment & Plan Note (Signed)
Encouraged exercise, weight loss, healthy eating habits. ? ?

## 2020-12-06 NOTE — Assessment & Plan Note (Signed)
Due for re-eval. 

## 2020-12-06 NOTE — Assessment & Plan Note (Signed)
Stable, chronic.  Continue current medication.   Gabapentin  300 mg TID

## 2020-12-06 NOTE — Assessment & Plan Note (Signed)
Stable, chronic.  Continue current medication.  Cymbalta 60 mg daily 

## 2020-12-06 NOTE — Assessment & Plan Note (Signed)
Stable, chronic.  Continue current medication.   HCTZ 12.5 mg daily, lasix 40 mg daily prn

## 2020-12-06 NOTE — Assessment & Plan Note (Signed)
Stable control currently on tamsulosin. Refilled.

## 2020-12-07 ENCOUNTER — Other Ambulatory Visit: Payer: Self-pay | Admitting: Family Medicine

## 2020-12-07 ENCOUNTER — Other Ambulatory Visit: Payer: Self-pay

## 2020-12-07 ENCOUNTER — Encounter: Payer: Self-pay | Admitting: Family Medicine

## 2020-12-07 ENCOUNTER — Ambulatory Visit (INDEPENDENT_AMBULATORY_CARE_PROVIDER_SITE_OTHER): Payer: Medicare HMO | Admitting: Family Medicine

## 2020-12-07 VITALS — BP 115/58 | HR 52 | Temp 97.6°F | Ht 67.5 in | Wt 286.2 lb

## 2020-12-07 DIAGNOSIS — I517 Cardiomegaly: Secondary | ICD-10-CM

## 2020-12-07 DIAGNOSIS — R001 Bradycardia, unspecified: Secondary | ICD-10-CM | POA: Diagnosis not present

## 2020-12-07 DIAGNOSIS — J439 Emphysema, unspecified: Secondary | ICD-10-CM

## 2020-12-07 DIAGNOSIS — E876 Hypokalemia: Secondary | ICD-10-CM | POA: Insufficient documentation

## 2020-12-07 DIAGNOSIS — J189 Pneumonia, unspecified organism: Secondary | ICD-10-CM | POA: Diagnosis not present

## 2020-12-07 DIAGNOSIS — E662 Morbid (severe) obesity with alveolar hypoventilation: Secondary | ICD-10-CM | POA: Diagnosis not present

## 2020-12-07 DIAGNOSIS — N401 Enlarged prostate with lower urinary tract symptoms: Secondary | ICD-10-CM

## 2020-12-07 DIAGNOSIS — J9601 Acute respiratory failure with hypoxia: Secondary | ICD-10-CM | POA: Diagnosis not present

## 2020-12-07 DIAGNOSIS — R351 Nocturia: Secondary | ICD-10-CM

## 2020-12-07 LAB — BASIC METABOLIC PANEL
BUN: 20 mg/dL (ref 6–23)
CO2: 37 mEq/L — ABNORMAL HIGH (ref 19–32)
Calcium: 9.6 mg/dL (ref 8.4–10.5)
Chloride: 94 mEq/L — ABNORMAL LOW (ref 96–112)
Creatinine, Ser: 0.92 mg/dL (ref 0.40–1.50)
GFR: 89.12 mL/min (ref >60.00)
Glucose, Bld: 127 mg/dL — ABNORMAL HIGH (ref 70–99)
Potassium: 3.4 mEq/L — ABNORMAL LOW (ref 3.5–5.1)
Sodium: 139 mEq/L (ref 135–145)

## 2020-12-07 LAB — BRAIN NATRIURETIC PEPTIDE: Pro B Natriuretic peptide (BNP): 213 pg/mL — ABNORMAL HIGH (ref 0.0–100.0)

## 2020-12-07 LAB — MAGNESIUM: Magnesium: 1.5 mg/dL (ref 1.5–2.5)

## 2020-12-07 MED ORDER — POTASSIUM CHLORIDE CRYS ER 20 MEQ PO TBCR: 20.0000 meq | EXTENDED_RELEASE_TABLET | Freq: Every day | ORAL | 0 refills | Status: AC

## 2020-12-07 NOTE — Assessment & Plan Note (Signed)
Stable control, but due for pulmonary follow up.

## 2020-12-07 NOTE — Assessment & Plan Note (Signed)
Now back to baseline on 2 L oxygen. Encouraged routine follow up with pulmonary.

## 2020-12-07 NOTE — Assessment & Plan Note (Addendum)
Due for re-eval. Check Mg as well.  On lasix and does not currently take potassium.

## 2020-12-07 NOTE — Assessment & Plan Note (Signed)
Given BNP elevated in hospital, now resolved peripheral edema... will eval repeat BNP and send for ECHO. Elevated feet and continue lasix 40 mg daily.  he does not have cardiologist.

## 2020-12-07 NOTE — Assessment & Plan Note (Signed)
Improving on levaquin.. complete 3 more days of course. Re-eval with chest CT for resolution in 3-4 weeks ( not seen on CXR)

## 2020-12-07 NOTE — Progress Notes (Signed)
Patient ID: Jonathon Graham, male    DOB: [birth year], 65 y.o.   MRN: [id number]  This visit was conducted in person.  BP (!) 115/58   Pulse (!) 52   Temp 97.6 F (36.4 C) (Temporal)   Ht 5' 7.5" (1.715 m)   Wt 286 lb 4 oz (129.8 kg)   SpO2 93%   BMI 44.17 kg/m    CC:  Chief Complaint  Patient presents with   Hospitalization Follow-up    Subjective:   HPI: Jonathon Graham is a 65 y.o. male presenting on 12/07/2020 for Hospitalization Follow-up   Admission: 7/3 Discharge 7/4  He was seen with new dx acute on chronic respiratory failure, community acquired pneumonia and COPD exacerbation. Inititally given Azactam and vancomycin... transitioned to levaquin   K 3.2, na 134, cr 0.70, wbc 9.4, hg 11.9, Mg 1.8  BNP 1800... CXR: NAD, cardiomegaly CT chest angio:No PE, RLL RUL PNA.Marland Kitchen not seen on CXR.. consdier re-eval in 3-4 weeks for resolution Cardiomegaly, unchanged aneurysm  He has been on 2 L of oxygen  prn chronically for  COPD and obesity hypoventilation. Pulmonary followed in past but he has not seen them in quite some time. Last OV note reviewed from 08/2019  Hr has run 55 in past at [facility].. today when checked seemed to decrease to 30s then back up to 50s per CMA.  [place] past EF 55-60% 2018  12/07/20  He  has noted improvement in feet swelling off feet. He  is in office today 93% on RA. Cough improving,   He is only occasionally cough, productive clear phelgm. No further blood sputum.  He continue to feel weak.  NO fever   He has 3 more days of antibiotics.  Relevant past medical, surgical, family and social history reviewed and updated as indicated. Interim medical history since our last visit reviewed. Allergies and medications reviewed and updated. Outpatient Medications Prior to Visit  Medication Sig Dispense Refill   acetaminophen (TYLENOL) 500 MG tablet Take 1 tablet (500 mg total) by mouth every 8 (eight) hours as needed. 30 tablet 0   albuterol (PROAIR HFA)  108 (90 Base) MCG/ACT inhaler Inhale 1-2 puffs into the lungs every 4 (four) hours as needed for wheezing or shortness of breath. 1 Inhaler 5   albuterol (PROVENTIL) (2.5 MG/3ML) 0.083% nebulizer solution Take 3 mLs (2.5 mg total) by nebulization every 6 (six) hours as needed for wheezing or shortness of breath. 360 mL 6   aspirin 81 MG tablet Take 243 mg by mouth daily. Takes 4 13m aspirin daily.     atorvastatin (LIPITOR) 20 MG tablet Take 1 tablet (20 mg total) by mouth daily. 90 tablet 0   betamethasone valerate ointment (VALISONE) 0.1 % Apply 1 application topically 2 (two) times daily. For up to 10 days. 30 g 0   blood glucose meter kit and supplies KIT Dispense based on patient and insurance preference. Use up to four times daily as directed. (FOR ICD-9 250.00, 250.01). 1 each 0   Cholecalciferol (VITAMIN D3) 1.25 MG (50000 UT) CAPS TAKE 1 CAPSULE BY MOUTH  ONCE A WEEK 12 capsule 0   clotrimazole (LOTRIMIN) 1 % cream Apply 1 application 2 (two) times daily topically. For 7 days 28 g 0   cyclobenzaprine (FLEXERIL) 10 MG tablet Take 1 tablet (10 mg total) by mouth at bedtime as needed for muscle spasms. 15 tablet 0   DULoxetine (CYMBALTA) 60 MG capsule Take 1 capsule (60 mg total) by  mouth daily. 90 capsule 1   Elastic Bandages & Supports (MEDICAL COMPRESSION SOCKS) MISC 1 each by Does not apply route daily. 1 each 0   Fluticasone-Salmeterol (ADVAIR DISKUS) 250-50 MCG/DOSE AEPB Inhale 1 puff into the lungs 2 (two) times daily. 180 each 3   furosemide (LASIX) 40 MG tablet Take 1 tablet (40 mg total) by mouth daily. 90 tablet 3   gabapentin (NEURONTIN) 300 MG capsule Take 1 capsule (300 mg total) by mouth 3 (three) times daily. 270 capsule 0   hydrochlorothiazide (MICROZIDE) 12.5 MG capsule Take 1 capsule by mouth once daily 90 capsule 0   ibuprofen (ADVIL) 800 MG tablet TAKE 1 TABLET BY MOUTH EVERY 8 HOURS AS NEEDED 60 tablet 0   levofloxacin (LEVAQUIN) 500 MG tablet Take 500 mg by mouth daily.      metFORMIN (GLUCOPHAGE) 1000 MG tablet TAKE 1 TABLET BY MOUTH TWICE DAILY WITH A MEAL 180 tablet 1   Misc. Devices (WRIST BRACE) MISC 2 Units by Does not apply route. Wear on wrists as needed for support     ondansetron (ZOFRAN) 8 MG tablet Take 1 tablet (8 mg total) by mouth every 8 (eight) hours as needed for nausea or vomiting. 12 tablet 0   oxyCODONE-acetaminophen (PERCOCET/ROXICET) 5-325 MG tablet Take 0.5 tablets by mouth daily as needed for severe pain. 15 tablet 0   tamsulosin (FLOMAX) 0.4 MG CAPS capsule Take 1 capsule (0.4 mg total) by mouth daily. 90 capsule 3   No facility-administered medications prior to visit.     Per HPI unless specifically indicated in ROS section below Review of Systems  Constitutional:  Positive for fatigue. Negative for fever.  HENT:  Negative for ear pain.   Eyes:  Negative for pain.  Respiratory:  Positive for shortness of breath. Negative for cough.   Cardiovascular:  Negative for chest pain, palpitations and leg swelling.  Gastrointestinal:  Negative for abdominal pain.  Genitourinary:  Negative for dysuria.  Musculoskeletal:  Negative for arthralgias.  Neurological:  Negative for syncope, light-headedness and headaches.  Psychiatric/Behavioral:  Negative for dysphoric mood.   Objective:  BP (!) 115/58   Pulse (!) 52   Temp 97.6 F (36.4 C) (Temporal)   Ht 5' 7.5" (1.715 m)   Wt 286 lb 4 oz (129.8 kg)   SpO2 93%   BMI 44.17 kg/m   Wt Readings from Last 3 Encounters:  12/07/20 286 lb 4 oz (129.8 kg)  11/26/20 295 lb 8 oz (134 kg)  10/19/20 297 lb (134.7 kg)      Physical Exam    Results for orders placed or performed in visit on 11/19/20  Cologuard  Result Value Ref Range   Cologuard Negative Negative    This visit occurred during the SARS-CoV-2 public health emergency.  Safety protocols were in place, including screening questions prior to the visit, additional usage of staff PPE, and extensive cleaning of exam room while  observing appropriate contact time as indicated for disinfecting solutions.   COVID 19 screen:  No recent travel or known exposure to COVID19 The patient denies respiratory symptoms of COVID 19 at this time. The importance of social distancing was discussed today.   Assessment and Plan    Problem List Items Addressed This Visit     COPD (chronic obstructive pulmonary disease) ([place]) (Chronic)    Stable control, but due for pulmonary follow up.       Obesity hypoventilation syndrome (HCC) (Chronic)    Now back to baseline on  2 L oxygen. Encouraged routine follow up with pulmonary.       Acute respiratory failure (HCC)    Acute on chronic respiratory failure.. improved and back at baseline.Marland Kitchen only occ using 2 L oxygen prn.       Bradycardia - Primary    EKG: normal EKG, normal sinus rhythm, unchanged from previous tracings, nonspecific ST and T waves changes, RBBB and frequent PVC.         Relevant Orders   EKG 12-Lead (Completed)   Cardiomegaly     Given BNP elevated in hospital, now resolved peripheral edema... will eval repeat BNP and send for ECHO. Elevated feet and continue lasix 40 mg daily.  he does not have cardiologist.       Relevant Orders   Brain natriuretic peptide (Completed)   ECHOCARDIOGRAM COMPLETE   Community acquired pneumonia of right lung    Improving on levaquin.. complete 3 more days of course. Re-eval with chest CT for resolution in 3-4 weeks ( not seen on CXR)       Relevant Medications   levofloxacin (LEVAQUIN) 500 MG tablet   Hypokalemia    Due for re-eval. Check Mg as well.  On lasix and does not currently take potassium.       Relevant Orders   Basic Metabolic Panel (Completed)   Magnesium (Completed)     Eliezer Lofts, MD

## 2020-12-07 NOTE — Assessment & Plan Note (Signed)
Acute on chronic respiratory failure.. improved and back at baseline.Marland Kitchen only occ using 2 L oxygen prn.

## 2020-12-07 NOTE — Assessment & Plan Note (Signed)
EKG: normal EKG, normal sinus rhythm, unchanged from previous tracings, nonspecific ST and T waves changes, RBBB and frequent PVC.

## 2020-12-07 NOTE — Patient Instructions (Addendum)
Call to make follow up with pulmonary doctor.  Please stop at the lab to have labs drawn.   We will set up heart test.

## 2020-12-09 DIAGNOSIS — G4733 Obstructive sleep apnea (adult) (pediatric): Secondary | ICD-10-CM | POA: Diagnosis not present

## 2020-12-10 ENCOUNTER — Other Ambulatory Visit: Payer: Self-pay

## 2020-12-10 ENCOUNTER — Encounter: Payer: Self-pay | Admitting: Pulmonary Disease

## 2020-12-10 ENCOUNTER — Ambulatory Visit (INDEPENDENT_AMBULATORY_CARE_PROVIDER_SITE_OTHER): Payer: Medicare HMO | Admitting: Pulmonary Disease

## 2020-12-10 VITALS — BP 124/68 | HR 51 | Temp 98.1°F | Ht 67.0 in | Wt 286.8 lb

## 2020-12-10 DIAGNOSIS — G4733 Obstructive sleep apnea (adult) (pediatric): Secondary | ICD-10-CM | POA: Diagnosis not present

## 2020-12-10 DIAGNOSIS — J9611 Chronic respiratory failure with hypoxia: Secondary | ICD-10-CM | POA: Diagnosis not present

## 2020-12-10 DIAGNOSIS — J9612 Chronic respiratory failure with hypercapnia: Secondary | ICD-10-CM

## 2020-12-10 DIAGNOSIS — J4540 Moderate persistent asthma, uncomplicated: Secondary | ICD-10-CM | POA: Diagnosis not present

## 2020-12-10 DIAGNOSIS — E662 Morbid (severe) obesity with alveolar hypoventilation: Secondary | ICD-10-CM | POA: Diagnosis not present

## 2020-12-10 MED ORDER — ALBUTEROL SULFATE HFA 108 (90 BASE) MCG/ACT IN AERS: 1.0000 | INHALATION_SPRAY | RESPIRATORY_TRACT | 5 refills | Status: DC | PRN

## 2020-12-10 MED ORDER — FLUTICASONE-SALMETEROL 250-50 MCG/ACT IN AEPB: 1.0000 | INHALATION_SPRAY | Freq: Two times a day (BID) | RESPIRATORY_TRACT | 11 refills | Status: DC

## 2020-12-10 NOTE — Progress Notes (Signed)
[facility]  Chief Complaint  Patient presents with   Follow-up    Patient reports he was in hospital for 4th of July with pneumonia. He reports some coughing some clear sputum.     Constitutional:  BP 124/68 (BP Location: Right Arm, Patient Position: Sitting, Cuff Size: Large)   Pulse (!) 51   Temp 98.1 F (36.7 C) (Oral)   Ht _0  (1.702 m)   Wt 286 lb 12.8 oz (130.1 kg)   SpO2 95%   BMI 44.92 kg/m   Past Medical History:  BPH, Depression, DM, HTN, Fibromyalgia, HLD  Past Surgical History:  He  has no past surgical history on file.  Brief Summary:  Jonathon Graham is a 65 y.o. male former smoker with obstructive sleep apnea, obesity hypoventilation syndrome, and asthmatic bronchitis.        Subjective:   He had COVID in April.  Was in hospital earlier this month with respiratory infection.  Feeling better now.  Not having cough, wheeze, or sputum.  Using CPAP nightly.  On 2 liters O2 24/7.  Gets dry mouth from CPAP.  Physical Exam:   Appearance - well kempt, wearing oxygen  ENMT - no sinus tenderness, no oral exudate, no LAN, Mallampati 3 airway, no stridor  Respiratory - equal breath sounds bilaterally, no wheezing or rales  CV - s1s2 regular rate and rhythm, no murmurs  Ext - no clubbing, no edema  Skin - no rashes  Psych - normal mood and affect   Pulmonary testing:  ABG 02/01/17 >> pH 7.39, PCO2 53, PO2 52 PFT 12/20/17 >> FEV1 2.22 (68%), FEV1% 79, TLC 4.81 (78%), DLCO 54%  Chest Imaging:  CT angio chest 03/24/17 >> CM, ascending aorta 4.5 cm, mild dilation PA, b/l upper lobe GGO likely infectious  Sleep Tests:  PSG 04/25/13 >> AHI 101.4, SpO2 low 57.4% PSG 07/24/17 >> AHI 139.7, SpO2 low 55.4%  Cardiac Tests:  Echo 11/20/16 >> EF 55 to 60%, mod RV dilation  Social History:  He  reports that he quit smoking about 3 years ago. His smoking use included cigarettes. He has never used smokeless tobacco. He  reports that he does not drink alcohol and does not use drugs.  Family History:  His family history includes Cancer (age of onset: 46) in his sister; Diabetes Mellitus II in his brother and sister; Fibrocystic breast disease in his sister; Heart disease in his father and mother; Hypertension in his sister; Myasthenia gravis in his sister.     Assessment/Plan:   Obstructive sleep apnea. - he reports compliance with therapy and benefit from CPAP - uses [employer] for his DME - will get copy of his last 30 days download - advised him to increase humidity to see if this helps with mouth dryness   Chronic respiratory failure with hypoxia and hypercapnia from obesity hypoventilation syndrome. - 2 liters O2 24/7 - goal SpO2 > 90%   Moderate, persistent asthma. - advair with prn albuterol  Time Spent Involved in Patient Care on Day of Examination:  25 minutes  Follow up:   Patient Instructions  Follow up in 6 months  Medication List:   Allergies as of 12/10/2020       Reactions   Penicillins Anaphylaxis, Swelling        Medication List        Accurate as of December 10, 2020 12:29 PM. If you have any questions, ask your nurse or doctor.  acetaminophen 500 MG tablet Commonly known as: TYLENOL Take 1 tablet (500 mg total) by mouth every 8 (eight) hours as needed.   albuterol (2.5 MG/3ML) 0.083% nebulizer solution Commonly known as: PROVENTIL Take 3 mLs (2.5 mg total) by nebulization every 6 (six) hours as needed for wheezing or shortness of breath.   albuterol 108 (90 Base) MCG/ACT inhaler Commonly known as: ProAir HFA Inhale 1-2 puffs into the lungs every 4 (four) hours as needed for wheezing or shortness of breath.   aspirin 81 MG tablet Take 243 mg by mouth daily. Takes 4 31m aspirin daily.   atorvastatin 20 MG tablet Commonly known as: LIPITOR Take 1 tablet (20 mg total) by mouth daily.   betamethasone valerate ointment 0.1 % Commonly known as:  VALISONE Apply 1 application topically 2 (two) times daily. For up to 10 days.   blood glucose meter kit and supplies Kit Dispense based on patient and insurance preference. Use up to four times daily as directed. (FOR ICD-9 250.00, 250.01).   clotrimazole 1 % cream Commonly known as: LOTRIMIN Apply 1 application 2 (two) times daily topically. For 7 days   cyclobenzaprine 10 MG tablet Commonly known as: FLEXERIL Take 1 tablet (10 mg total) by mouth at bedtime as needed for muscle spasms.   DULoxetine 60 MG capsule Commonly known as: Cymbalta Take 1 capsule (60 mg total) by mouth daily.   fluticasone-salmeterol 250-50 MCG/ACT Aepb Commonly known as: ADVAIR Inhale 1 puff into the lungs every 12 (twelve) hours. What changed:  medication strength when to take this Changed by: VChesley Mires MD   furosemide 40 MG tablet Commonly known as: LASIX Take 1 tablet (40 mg total) by mouth daily.   gabapentin 300 MG capsule Commonly known as: NEURONTIN Take 1 capsule (300 mg total) by mouth 3 (three) times daily.   hydrochlorothiazide 12.5 MG capsule Commonly known as: MICROZIDE Take 1 capsule by mouth once daily   ibuprofen 800 MG tablet Commonly known as: ADVIL TAKE 1 TABLET BY MOUTH EVERY 8 HOURS AS NEEDED   levofloxacin 500 MG tablet Commonly known as: LEVAQUIN Take 500 mg by mouth daily.   Medical Compression Socks Misc 1 each by Does not apply route daily.   metFORMIN 1000 MG tablet Commonly known as: GLUCOPHAGE TAKE 1 TABLET BY MOUTH TWICE DAILY WITH A MEAL   ondansetron 8 MG tablet Commonly known as: ZOFRAN Take 1 tablet (8 mg total) by mouth every 8 (eight) hours as needed for nausea or vomiting.   oxyCODONE-acetaminophen 5-325 MG tablet Commonly known as: PERCOCET/ROXICET Take 0.5 tablets by mouth daily as needed for severe pain.   potassium chloride SA 20 MEQ tablet Commonly known as: KLOR-CON Take 1 tablet (20 mEq total) by mouth daily.   tamsulosin 0.4 MG  Caps capsule Commonly known as: FLOMAX Take 1 capsule (0.4 mg total) by mouth daily.   Vitamin D3 1.25 MG (50000 UT) Caps TAKE 1 CAPSULE BY MOUTH  ONCE A WEEK   Wrist Brace Misc 2 Units by Does not apply route. Wear on wrists as needed for support        Signature:  VChesley Mires MD LStrathmerePager - [phone]/15/2022, 12:29 PM

## 2020-12-10 NOTE — Patient Instructions (Signed)
Follow up in 6 months 

## 2020-12-11 ENCOUNTER — Other Ambulatory Visit: Payer: Self-pay | Admitting: Family Medicine

## 2020-12-11 DIAGNOSIS — R351 Nocturia: Secondary | ICD-10-CM

## 2020-12-11 DIAGNOSIS — N401 Enlarged prostate with lower urinary tract symptoms: Secondary | ICD-10-CM

## 2020-12-13 ENCOUNTER — Encounter: Payer: Self-pay | Admitting: *Deleted

## 2020-12-13 NOTE — Telephone Encounter (Signed)
This encounter was created in error - please disregard.

## 2020-12-24 ENCOUNTER — Telehealth: Payer: Self-pay

## 2020-12-24 ENCOUNTER — Other Ambulatory Visit: Payer: Self-pay | Admitting: Family Medicine

## 2020-12-24 NOTE — Telephone Encounter (Signed)
Pt said for a while pt has been taking metformin 1000 mg bid but FBS running around 300. Pt ran out of metformin and pts niece gave him ozempic # 2 pills; pt took one ozempic in morning x 2 days and FBS was 130. Pt wants to get switched to ozempic. Dr Ermalene Searing is out of office next wk and first available appt with DR Ermalene Searing is 01/07/21 and pt does not want to wait that long. Pt scheduled appt with Dr Patsy Lager for 12/27/20 at 10:20. UC & ED precautions given and pt voiced understanding and will take med as directed over weekend. Sending note to DR Ermalene Searing and Dr Patsy Lager and Lupita Leash CMA.

## 2020-12-24 NOTE — Progress Notes (Signed)
I do not think ozempic is available locally in oral form.  Lupita Leash  can you call his pharmacy and see if they have the  oral med? I do not see it in epic.   I would be happy to prescribe it if available and he does not need the appt with Dr. Patsy Lager.

## 2020-12-24 NOTE — Progress Notes (Addendum)
Spoke with Pharmacist at [facility] and she states Ozempic is only available in injectable.    Mr. Gersten notified of this.  He does not want to do injections at the time.  He will double check with his family member to verify name of medication she gave him.

## 2020-12-25 DIAGNOSIS — J449 Chronic obstructive pulmonary disease, unspecified: Secondary | ICD-10-CM | POA: Diagnosis not present

## 2020-12-27 ENCOUNTER — Ambulatory Visit: Payer: Medicare HMO | Admitting: Family Medicine

## 2020-12-27 NOTE — Telephone Encounter (Signed)
Touched base with Jonathon Graham today about the Ozempic.  He states he spoke with the family member and it is the Ozempic injectable and he is willing to try that. Will forward to Dr. Ermalene Searing to send in new prescription to [facility] in [place].

## 2020-12-28 ENCOUNTER — Other Ambulatory Visit: Payer: Self-pay | Admitting: Family Medicine

## 2020-12-28 MED ORDER — OZEMPIC (0.25 OR 0.5 MG/DOSE) 2 MG/1.5ML ~~LOC~~ SOPN: 0.2500 mg | PEN_INJECTOR | SUBCUTANEOUS | 0 refills | Status: DC

## 2020-12-28 NOTE — Telephone Encounter (Signed)
Jonathon Graham notified as instructed by telephone.  He will schedule his 4 week follow up next week when he is here on Tuesday following up on PNA.

## 2020-12-28 NOTE — Telephone Encounter (Signed)
Ordered.. Call pt and have him make 4 week follow up appt , bring in fasting blood sugar measurments.

## 2020-12-28 NOTE — Progress Notes (Signed)
Prescription sent in for starting dose of ozempic. Have pt make 4 week follow up appt.  Have him check blood sugars fasting,record and bring to appt.

## 2021-01-03 ENCOUNTER — Other Ambulatory Visit: Payer: Self-pay

## 2021-01-03 DIAGNOSIS — E1142 Type 2 diabetes mellitus with diabetic polyneuropathy: Secondary | ICD-10-CM

## 2021-01-03 NOTE — Telephone Encounter (Signed)
Pt left v/m requesting refill atorvastatin 20 mg to [facility] [address].  Sending note to [facility] CMA.

## 2021-01-04 ENCOUNTER — Ambulatory Visit (INDEPENDENT_AMBULATORY_CARE_PROVIDER_SITE_OTHER): Payer: Medicare HMO | Admitting: Family Medicine

## 2021-01-04 ENCOUNTER — Encounter: Payer: Self-pay | Admitting: Family Medicine

## 2021-01-04 ENCOUNTER — Other Ambulatory Visit: Payer: Self-pay

## 2021-01-04 VITALS — BP 118/76 | HR 47 | Temp 97.4°F | Ht 67.0 in | Wt 286.0 lb

## 2021-01-04 DIAGNOSIS — E1142 Type 2 diabetes mellitus with diabetic polyneuropathy: Secondary | ICD-10-CM | POA: Diagnosis not present

## 2021-01-04 DIAGNOSIS — I517 Cardiomegaly: Secondary | ICD-10-CM

## 2021-01-04 DIAGNOSIS — E876 Hypokalemia: Secondary | ICD-10-CM

## 2021-01-04 DIAGNOSIS — J189 Pneumonia, unspecified organism: Secondary | ICD-10-CM

## 2021-01-04 MED ORDER — ATORVASTATIN CALCIUM 20 MG PO TABS: 20.0000 mg | ORAL_TABLET | Freq: Every day | ORAL | 3 refills | Status: DC

## 2021-01-04 NOTE — Patient Instructions (Addendum)
Go to [facility] for chest X-ray follow up.  We will call you to set up the heart test.  Still keep up with potassium intake... blueberries. Continue current dose of ozempic.

## 2021-01-04 NOTE — Progress Notes (Signed)
Patient ID: Jonathon Graham, male    DOB: 11/21/1955, 65 y.o.   MRN: [id number]  This visit was conducted in person.  BP 118/76   Pulse (!) 47   Temp (!) 97.4 F (36.3 C) (Temporal)   Ht '5\' 7"'  (1.702 m)   Wt 286 lb (129.7 kg)   SpO2 90%   BMI 44.79 kg/m    CC: Chief Complaint  Patient presents with   Follow-up    3 WEEKS    Subjective:   HPI: Jonathon Graham is a 65 y.o. male presenting on 01/04/2021 for Follow-up (3 WEEKS)  Reviewed last OV from 12/07/2020 He is 4 weeks out from PNA.Marland Kitchen recommended CXR for to eval for PNA resolution.  COPD, back to baseline 2 L oxygen prn.  Referred for  ECHO given cardiomegaly noted, elevated BNP in hospital and peripheral edema, DOE ON lasix 40 mg daily.   He cannot tolerate metformin and was interested in trial of ozempic for his DM.  He has taken it once. No side effect.. he has noted decreased FBS 115-120, no lows < 60.     Low potassium.Marland Kitchen given Kdur 20 mEQ daily can take every few days.   Relevant past medical, surgical, family and social history reviewed and updated as indicated. Interim medical history since our last visit reviewed. Allergies and medications reviewed and updated. Outpatient Medications Prior to Visit  Medication Sig Dispense Refill   acetaminophen (TYLENOL) 500 MG tablet Take 1 tablet (500 mg total) by mouth every 8 (eight) hours as needed. 30 tablet 0   albuterol (PROAIR HFA) 108 (90 Base) MCG/ACT inhaler Inhale 1-2 puffs into the lungs every 4 (four) hours as needed for wheezing or shortness of breath. 1 each 5   albuterol (PROVENTIL) (2.5 MG/3ML) 0.083% nebulizer solution Take 3 mLs (2.5 mg total) by nebulization every 6 (six) hours as needed for wheezing or shortness of breath. 360 mL 6   aspirin 81 MG tablet Take 243 mg by mouth daily. Takes 4 33m aspirin daily.     atorvastatin (LIPITOR) 20 MG tablet Take 1 tablet (20 mg total) by mouth daily. 90 tablet 3   betamethasone valerate ointment (VALISONE) 0.1 %  Apply 1 application topically 2 (two) times daily. For up to 10 days. 30 g 0   blood glucose meter kit and supplies KIT Dispense based on patient and insurance preference. Use up to four times daily as directed. (FOR ICD-9 250.00, 250.01). 1 each 0   Cholecalciferol (VITAMIN D3) 1.25 MG (50000 UT) CAPS TAKE 1 CAPSULE BY MOUTH  ONCE A WEEK 12 capsule 0   clotrimazole (LOTRIMIN) 1 % cream Apply 1 application 2 (two) times daily topically. For 7 days 28 g 0   cyclobenzaprine (FLEXERIL) 10 MG tablet Take 1 tablet (10 mg total) by mouth at bedtime as needed for muscle spasms. 15 tablet 0   DULoxetine (CYMBALTA) 60 MG capsule Take 1 capsule (60 mg total) by mouth daily. 90 capsule 1   Elastic Bandages & Supports (MEDICAL COMPRESSION SOCKS) MISC 1 each by Does not apply route daily. 1 each 0   fluticasone-salmeterol (ADVAIR) 250-50 MCG/ACT AEPB Inhale 1 puff into the lungs every 12 (twelve) hours. 60 each 11   furosemide (LASIX) 40 MG tablet Take 1 tablet (40 mg total) by mouth daily. 90 tablet 3   gabapentin (NEURONTIN) 300 MG capsule Take 1 capsule (300 mg total) by mouth 3 (three) times daily. 270 capsule 0   hydrochlorothiazide (MICROZIDE) 12.5  MG capsule Take 1 capsule by mouth once daily 90 capsule 0   ibuprofen (ADVIL) 800 MG tablet TAKE 1 TABLET BY MOUTH EVERY 8 HOURS AS NEEDED 60 tablet 0   levofloxacin (LEVAQUIN) 500 MG tablet Take 500 mg by mouth daily.     metFORMIN (GLUCOPHAGE) 1000 MG tablet TAKE 1 TABLET BY MOUTH TWICE DAILY WITH A MEAL 180 tablet 1   Misc. Devices (WRIST BRACE) MISC 2 Units by Does not apply route. Wear on wrists as needed for support     ondansetron (ZOFRAN) 8 MG tablet Take 1 tablet (8 mg total) by mouth every 8 (eight) hours as needed for nausea or vomiting. 12 tablet 0   oxyCODONE-acetaminophen (PERCOCET/ROXICET) 5-325 MG tablet Take 0.5 tablets by mouth daily as needed for severe pain. 15 tablet 0   potassium chloride SA (KLOR-CON) 20 MEQ tablet Take 1 tablet (20 mEq  total) by mouth daily. 90 tablet 0   Semaglutide,0.25 or 0.5MG/DOS, (OZEMPIC, 0.25 OR 0.5 MG/DOSE,) 2 MG/1.5ML SOPN Inject 0.25 mg into the skin once a week. 6 mL 0   tamsulosin (FLOMAX) 0.4 MG CAPS capsule Take 1 capsule (0.4 mg total) by mouth daily. 90 capsule 3   No facility-administered medications prior to visit.     Per HPI unless specifically indicated in ROS section below Review of Systems  Constitutional:  Positive for fatigue. Negative for fever.  HENT:  Negative for ear pain.   Eyes:  Negative for pain.  Respiratory:  Negative for cough and shortness of breath.   Cardiovascular:  Negative for chest pain, palpitations and leg swelling.  Gastrointestinal:  Negative for abdominal pain.  Genitourinary:  Negative for dysuria.  Musculoskeletal:  Negative for arthralgias.  Neurological:  Negative for syncope, light-headedness and headaches.  Psychiatric/Behavioral:  Negative for dysphoric mood.   Objective:  BP 118/76   Pulse (!) 47   Temp (!) 97.4 F (36.3 C) (Temporal)   Ht '5\' 7"'  (1.702 m)   Wt 286 lb (129.7 kg)   SpO2 90%   BMI 44.79 kg/m   Wt Readings from Last 3 Encounters:  01/04/21 286 lb (129.7 kg)  12/10/20 286 lb 12.8 oz (130.1 kg)  12/07/20 286 lb 4 oz (129.8 kg)      Physical Exam Constitutional:      Appearance: He is well-developed. He is obese.  HENT:     Head: Normocephalic.     Right Ear: Hearing normal.     Left Ear: Hearing normal.     Nose: Nose normal.  Neck:     Thyroid: No thyroid mass or thyromegaly.     Vascular: No carotid bruit.     Trachea: Trachea normal.  Cardiovascular:     Rate and Rhythm: Normal rate and regular rhythm.     Pulses: Normal pulses.     Heart sounds: Heart sounds not distant. No murmur heard.   No friction rub. No gallop.     Comments: Peripheral edema resolved. Pulmonary:     Effort: Pulmonary effort is normal. No respiratory distress.     Breath sounds: Normal breath sounds.  Skin:    General: Skin is warm  and dry.     Findings: No rash.  Psychiatric:        Speech: Speech normal.        Behavior: Behavior normal.        Thought Content: Thought content normal.      Results for orders placed or performed in visit on 12/07/20  Basic Metabolic Panel  Result Value Ref Range   Sodium 139 135 - 145 mEq/L   Potassium 3.4 (L) 3.5 - 5.1 mEq/L   Chloride 94 (L) 96 - 112 mEq/L   CO2 37 (H) 19 - 32 mEq/L   Glucose, Bld 127 (H) 70 - 99 mg/dL   BUN 20 6 - 23 mg/dL   Creatinine, Ser 0.92 0.40 - 1.50 mg/dL   GFR 89.12 >60.00 mL/min   Calcium 9.6 8.4 - 10.5 mg/dL  Magnesium  Result Value Ref Range   Magnesium 1.5 1.5 - 2.5 mg/dL  Brain natriuretic peptide  Result Value Ref Range   Pro B Natriuretic peptide (BNP) 213.0 (H) 0.0 - 100.0 pg/mL    This visit occurred during the SARS-CoV-2 public health emergency.  Safety protocols were in place, including screening questions prior to the visit, additional usage of staff PPE, and extensive cleaning of exam room while observing appropriate contact time as indicated for disinfecting solutions.   COVID 19 screen:  No recent travel or known exposure to COVID19 The patient denies respiratory symptoms of COVID 19 at this time. The importance of social distancing was discussed today.   Assessment and Plan    Problem List Items Addressed This Visit     Type 2 diabetes, controlled, with peripheral neuropathy (HCC) (Chronic)    Chronic, improving control on Ozempic.Marland Kitchen tolerating well.      Cardiomegaly    New, Referred for  ECHO given cardiomegaly noted, elevated BNP in hospital and peripheral edema, DOE ON lasix 40 mg daily.      Community acquired pneumonia of right lung - Primary     Due for re-eval with imaging to assure resolution. Pt back at baseline per report.      Relevant Orders   DG Chest 2 View   Hypokalemia    Given Kdur 20 mEQ daily can take every few days.        Eliezer Lofts, MD

## 2021-01-05 ENCOUNTER — Other Ambulatory Visit: Payer: Self-pay | Admitting: Family Medicine

## 2021-01-05 ENCOUNTER — Telehealth: Payer: Self-pay | Admitting: Family Medicine

## 2021-01-05 NOTE — Telephone Encounter (Signed)
Chart message sent  with [facility]ht[facility] message about ECHO  [Yesterday 11:56 AM] Nadara Eaton its approved so he will get a call from them to schedule - we have notified them that this was finally approved. OR he can call them to schedule [phone]

## 2021-01-18 ENCOUNTER — Telehealth: Payer: Self-pay | Admitting: *Deleted

## 2021-01-18 ENCOUNTER — Other Ambulatory Visit: Payer: Self-pay

## 2021-01-18 ENCOUNTER — Ambulatory Visit (HOSPITAL_BASED_OUTPATIENT_CLINIC_OR_DEPARTMENT_OTHER)
Admission: RE | Admit: 2021-01-18 | Discharge: 2021-01-18 | Disposition: A | Discharge status: Home / Self Care | Payer: Medicare HMO | Source: Ambulatory Visit | Attending: Family Medicine | Admitting: Family Medicine

## 2021-01-18 DIAGNOSIS — I517 Cardiomegaly: Secondary | ICD-10-CM | POA: Insufficient documentation

## 2021-01-18 LAB — ECHOCARDIOGRAM COMPLETE
AR max vel: 4.82 cm2
AV Area VTI: 5.77 cm2
AV Area mean vel: 4.99 cm2
AV Mean grad: 3 mmHg
AV Peak grad: 5.1 mmHg
Ao pk vel: 1.13 m/s
S' Lateral: 4.17 cm
Single Plane A4C EF: 57.6 %

## 2021-01-18 NOTE — Telephone Encounter (Signed)
PLEASE NOTE: All timestamps contained within this report are represented as [place] Standard Time. CONFIDENTIALTY NOTICE: This fax transmission is intended only for the addressee. It contains information that is legally privileged, confidential or otherwise protected from use or disclosure. If you are not the intended recipient, you are strictly prohibited from reviewing, disclosing, copying using or disseminating any of this information or taking any action in reliance on or regarding this information. If you have received this fax in error, please notify us immediately by telephone so that we can arrange for its return to [place]. Phone: [phone], Toll-Free: [phone], Fax: [phone] Page: 1 of 2 Call Id: [id number] [facility] Day - Client TELEPHONE ADVICE RECORD AccessNurse Patient Name: Jonathon Graham RTS Gender: Male DOB: 1956-04-15 Age: 65 Y 8 M 6 D Return Phone Number: [phone] (Primary), [phone] (Secondary) Address: [place] [address] Client [facility] Day - Client Client Site [facility] - Day Physician Kerby Nora - MD Contact Type Call Who Is Calling Patient / Member / Family / Caregiver Call Type Triage / Clinical Relationship To Patient Self Return Phone Number [phone] (Primary) Chief Complaint BREATHING - shortness of breath or sounds breathless Reason for Call Symptomatic / Request for Health Information Initial Comment Caller states having RT hand pain radiating to shoulder; COPD and having SOB. trans from office; Translation No Nurse Assessment Nurse: Elesa Hacker, RN, Nash Dimmer Date/Time Lamount Cohen Time): 01/18/2021 9:10:03 AM Confirm and document reason for call. If symptomatic, describe symptoms. ---Caller states having RT hand pain radiating to shoulder; COPD and having SOB. trans from office; No known injury, has been painting. Does the patient have any new or  worsening symptoms? ---Yes Will a triage be completed? ---Yes Related visit to physician within the last 2 weeks? ---No Does the PT have any chronic conditions? (i.e. diabetes, asthma, this includes High risk factors for pregnancy, etc.) ---Yes List chronic conditions. ---COPD Diabetes neuropathy HTN Is this a behavioral health or substance abuse call? ---No Guidelines Guideline Title Affirmed Question Affirmed Notes Nurse Date/Time (Eastern Time) Hand and Wrist Pain Weakness (i.e., loss of strength) of newonset in hand or fingers (Exceptions: not truly weak, hand feels weak because of pain; weakness present > 2 weeks) Deaton, RN, Nash Dimmer 01/18/2021 9:14:26 AM PLEASE NOTE: All timestamps contained within this report are represented as [place] Standard Time. CONFIDENTIALTY NOTICE: This fax transmission is intended only for the addressee. It contains information that is legally privileged, confidential or otherwise protected from use or disclosure. If you are not the intended recipient, you are strictly prohibited from reviewing, disclosing, copying using or disseminating any of this information or taking any action in reliance on or regarding this information. If you have received this fax in error, please notify us immediately by telephone so that we can arrange for its return to [place]. Phone: [phone], Toll-Free: [phone], Fax: [phone] Page: 2 of 2 Call Id: [id number] Disp. Time Lamount Cohen Time) Disposition Final User 01/18/2021 9:08:17 AM Send to Urgent Queue Albin Fischer 01/18/2021 9:16:37 AM See PCP within 24 Hours Yes Deaton, RN, Cory Roughen Disagree/Comply Comply Caller Understands Yes PreDisposition Did not know what to do Care Advice Given Per Guideline SEE PCP WITHIN 24 HOURS: * IF OFFICE WILL BE OPEN: You need to be examined within the next 24 hours. Call your doctor (or NP/PA) when the office opens and make an appointment. * ACETAMINOPHEN - EXTRA STRENGTH TYLENOL:  Take 1,000 mg (two 500 mg pills)  every 8 hours as needed. Each Extra Strength Tylenol pill has 500 mg of acetaminophen. The most you should take each day is 3,000 mg (6 pills a day). * IBUPROFEN (E.G., MOTRIN, ADVIL): Take 400 mg (two 200 mg pills) by mouth every 6 hours. The most you should take each day is 1,200 mg (six 200 mg pills), unless your doctor has told you to take more. CALL BACK IF: * You become worse CARE ADVICE given per Hand and Wrist Pain (Adult) guideline. Comments User: Wandra Scot, RN Date/Time Lamount Cohen Time): 01/18/2021 9:15:37 AM Caller denies any SOB or difficulty breathing. Caller is speaking in full sentences. User: Wandra Scot, RN Date/Time Lamount Cohen Time): 01/18/2021 9:18:27 AM Caller advised that he has been painting in his home. Has not taking anything for pain. Transferred to the office to schedule an appt. Referrals REFERRED TO PCP OFFICE

## 2021-01-18 NOTE — Telephone Encounter (Signed)
Noted. Thanks you for seeing him Dr. Salena Saner.

## 2021-01-18 NOTE — Progress Notes (Signed)
*  PRELIMINARY RESULTS* Echocardiogram 2D Echocardiogram has been performed.  Neomia Dear RDCS 01/18/2021, 1:57 PM

## 2021-01-18 NOTE — Telephone Encounter (Signed)
Spoke to patient by telephone and was advised that he is not having any chest pain. Patient stated that he has SOB because he has COPD and that is not any worse than usual. Patient stated that his oxygen level is running 94%. Patient stated that he thinks that the pain in is right hand and elbow may be because of the painting that he has been doing. Patient scheduled for an appointment with Dr. Patsy Lager tomorrow  01/19/21 at 9:40 am. Patient was given ER precautions and he is verbalized understanding.

## 2021-01-19 ENCOUNTER — Ambulatory Visit: Payer: Medicare HMO | Admitting: Family Medicine

## 2021-01-19 DIAGNOSIS — M79641 Pain in right hand: Secondary | ICD-10-CM

## 2021-01-20 ENCOUNTER — Other Ambulatory Visit: Payer: Self-pay | Admitting: Family Medicine

## 2021-01-20 DIAGNOSIS — I5020 Unspecified systolic (congestive) heart failure: Secondary | ICD-10-CM

## 2021-01-20 NOTE — Progress Notes (Signed)
Referral ordered

## 2021-01-21 ENCOUNTER — Other Ambulatory Visit: Payer: Self-pay

## 2021-01-21 ENCOUNTER — Ambulatory Visit (INDEPENDENT_AMBULATORY_CARE_PROVIDER_SITE_OTHER): Payer: Medicare HMO | Admitting: Family Medicine

## 2021-01-21 ENCOUNTER — Encounter: Payer: Self-pay | Admitting: Family Medicine

## 2021-01-21 VITALS — BP 90/60 | HR 58 | Temp 97.3°F | Ht 67.0 in | Wt 287.2 lb

## 2021-01-21 DIAGNOSIS — E785 Hyperlipidemia, unspecified: Secondary | ICD-10-CM | POA: Diagnosis not present

## 2021-01-21 DIAGNOSIS — E1142 Type 2 diabetes mellitus with diabetic polyneuropathy: Secondary | ICD-10-CM | POA: Diagnosis not present

## 2021-01-21 DIAGNOSIS — M5442 Lumbago with sciatica, left side: Secondary | ICD-10-CM | POA: Diagnosis not present

## 2021-01-21 DIAGNOSIS — E1169 Type 2 diabetes mellitus with other specified complication: Secondary | ICD-10-CM | POA: Diagnosis not present

## 2021-01-21 DIAGNOSIS — G8929 Other chronic pain: Secondary | ICD-10-CM

## 2021-01-21 DIAGNOSIS — E876 Hypokalemia: Secondary | ICD-10-CM | POA: Diagnosis not present

## 2021-01-21 DIAGNOSIS — M5441 Lumbago with sciatica, right side: Secondary | ICD-10-CM | POA: Diagnosis not present

## 2021-01-21 DIAGNOSIS — I5020 Unspecified systolic (congestive) heart failure: Secondary | ICD-10-CM | POA: Diagnosis not present

## 2021-01-21 MED ORDER — IBUPROFEN 800 MG PO TABS: 800.0000 mg | ORAL_TABLET | Freq: Three times a day (TID) | ORAL | 0 refills | Status: DC | PRN

## 2021-01-21 MED ORDER — OZEMPIC (0.25 OR 0.5 MG/DOSE) 2 MG/1.5ML ~~LOC~~ SOPN: 0.2500 mg | PEN_INJECTOR | SUBCUTANEOUS | 0 refills | Status: DC

## 2021-01-21 NOTE — Assessment & Plan Note (Signed)
Secondary  To lasix.Marland Kitchen improved now on daily potassium.

## 2021-01-21 NOTE — Patient Instructions (Addendum)
Stop HCTZ. Use lasix 40 mg daily. Make sure to take potassium with this.  Keep Cardiology referral when made. If  any chest pain or change in shortness of breath.. call or  if severe go to ER.

## 2021-01-21 NOTE — Assessment & Plan Note (Signed)
LDL at goal on atorvastatin 20 mg daily

## 2021-01-21 NOTE — Progress Notes (Signed)
Patient ID: Jonathon Graham, male    DOB: 23-Feb-1956, 65 y.o.   MRN: [id number]  This visit was conducted in person.  BP 90/60   Pulse (!) 58   Temp (!) 97.3 F (36.3 C) (Temporal)   Ht _0  (1.702 m)   Wt 287 lb 4 oz (130.3 kg)   SpO2 95%   BMI 44.99 kg/m    CC: Chief Complaint  Patient presents with   Arm Pain    Right    Subjective:   HPI: Jonathon Graham is a 65 y.o. male presenting on 01/21/2021 for Arm Pain (Right)    DM control.. recent start on weekly ozempic 0.25 mg once weekly  FBS 110-120 Wt Readings from Last 3 Encounters:  01/21/21 287 lb 4 oz (130.3 kg)  01/04/21 286 lb (129.7 kg)  12/10/20 286 lb 12.8 oz (130.1 kg)    He reports new onset right arm pain.Marland Kitchen improving with stretching. Pain centered around muscles.. biceps and elbow.  Using muscle relaxant and ibuprofen prn.  Now minimal pain. No swelling or redness.   He has cardiology referral pending for  new Dx of HFrEF on ECHO.. EF 40-45%  He is on lasix 40 mg daily and Kdur 20 MEQ  Cardiomegaly noted  HR remains low at 58 EKG from 12/07/2020 reviewed : Frequent pvcs -ventricular trigeminy  -Right bundle branch block with left axis -bifascicular block.   -  Nonspecific T-abnormality.  No CP , stbale SOB.   No recent stress test... normal per pt in [place] in 2009    ? Why he is also on HCTZ.  BP Readings from Last 3 Encounters:  01/21/21 90/60  01/04/21 118/76  12/10/20 124/68   He never had follow up CXR to re-eval for resolution of CA PNA  No cough, no sputum production, stable SOB.   Followed by Dr. Halford Chessman for OSA, moderate asthma and  obesity related hypoxia and hypoventilation on chronic oxygen.  Relevant past medical, surgical, family and social history reviewed and updated as indicated. Interim medical history since our last visit reviewed. Allergies and medications reviewed and updated. Outpatient Medications Prior to Visit  Medication Sig Dispense Refill   acetaminophen  (TYLENOL) 500 MG tablet Take 1 tablet (500 mg total) by mouth every 8 (eight) hours as needed. 30 tablet 0   albuterol (PROAIR HFA) 108 (90 Base) MCG/ACT inhaler Inhale 1-2 puffs into the lungs every 4 (four) hours as needed for wheezing or shortness of breath. 1 each 5   albuterol (PROVENTIL) (2.5 MG/3ML) 0.083% nebulizer solution Take 3 mLs (2.5 mg total) by nebulization every 6 (six) hours as needed for wheezing or shortness of breath. 360 mL 6   aspirin 81 MG tablet Take 243 mg by mouth daily. Takes 4 69m aspirin daily.     atorvastatin (LIPITOR) 20 MG tablet Take 1 tablet (20 mg total) by mouth daily. 90 tablet 3   betamethasone valerate ointment (VALISONE) 0.1 % Apply 1 application topically 2 (two) times daily. For up to 10 days. 30 g 0   blood glucose meter kit and supplies KIT Dispense based on patient and insurance preference. Use up to four times daily as directed. (FOR ICD-9 250.00, 250.01). 1 each 0   Cholecalciferol (VITAMIN D3) 1.25 MG (50000 UT) CAPS TAKE 1 CAPSULE BY MOUTH  ONCE A WEEK 12 capsule 0   clotrimazole (LOTRIMIN) 1 % cream Apply 1 application 2 (two) times daily topically. For 7 days 28 g 0  cyclobenzaprine (FLEXERIL) 10 MG tablet Take 1 tablet (10 mg total) by mouth at bedtime as needed for muscle spasms. 15 tablet 0   DULoxetine (CYMBALTA) 60 MG capsule Take 1 capsule (60 mg total) by mouth daily. 90 capsule 1   Elastic Bandages & Supports (MEDICAL COMPRESSION SOCKS) MISC 1 each by Does not apply route daily. 1 each 0   fluticasone-salmeterol (ADVAIR) 250-50 MCG/ACT AEPB Inhale 1 puff into the lungs every 12 (twelve) hours. 60 each 11   furosemide (LASIX) 40 MG tablet Take 1 tablet (40 mg total) by mouth daily. 90 tablet 3   gabapentin (NEURONTIN) 300 MG capsule Take 1 capsule (300 mg total) by mouth 3 (three) times daily. 270 capsule 0   hydrochlorothiazide (MICROZIDE) 12.5 MG capsule Take 1 capsule by mouth once daily 90 capsule 1   ibuprofen (ADVIL) 800 MG tablet  TAKE 1 TABLET BY MOUTH EVERY 8 HOURS AS NEEDED 60 tablet 0   Misc. Devices (WRIST BRACE) MISC 2 Units by Does not apply route. Wear on wrists as needed for support     ondansetron (ZOFRAN) 8 MG tablet Take 1 tablet (8 mg total) by mouth every 8 (eight) hours as needed for nausea or vomiting. 12 tablet 0   oxyCODONE-acetaminophen (PERCOCET/ROXICET) 5-325 MG tablet Take 0.5 tablets by mouth daily as needed for severe pain. 15 tablet 0   potassium chloride SA (KLOR-CON) 20 MEQ tablet Take 1 tablet (20 mEq total) by mouth daily. 90 tablet 0   Semaglutide,0.25 or 0.5MG/DOS, (OZEMPIC, 0.25 OR 0.5 MG/DOSE,) 2 MG/1.5ML SOPN Inject 0.25 mg into the skin once a week. 6 mL 0   tamsulosin (FLOMAX) 0.4 MG CAPS capsule Take 1 capsule (0.4 mg total) by mouth daily. 90 capsule 3   levofloxacin (LEVAQUIN) 500 MG tablet Take 500 mg by mouth daily.     No facility-administered medications prior to visit.     Per HPI unless specifically indicated in ROS section below Review of Systems Objective:  BP 90/60   Pulse (!) 58   Temp (!) 97.3 F (36.3 C) (Temporal)   Ht _0  (1.702 m)   Wt 287 lb 4 oz (130.3 kg)   SpO2 95%   BMI 44.99 kg/m   Wt Readings from Last 3 Encounters:  01/21/21 287 lb 4 oz (130.3 kg)  01/04/21 286 lb (129.7 kg)  12/10/20 286 lb 12.8 oz (130.1 kg)      Physical Exam Constitutional:      Appearance: He is well-developed. He is obese.  HENT:     Head: Normocephalic.     Right Ear: Hearing normal.     Left Ear: Hearing normal.     Nose: Nose normal.  Neck:     Thyroid: No thyroid mass or thyromegaly.     Vascular: No carotid bruit.     Trachea: Trachea normal.  Cardiovascular:     Rate and Rhythm: Normal rate and regular rhythm.     Pulses: Normal pulses.     Heart sounds: Heart sounds not distant. No murmur heard.   No friction rub. No gallop.     Comments: No peripheral edema Pulmonary:     Effort: Pulmonary effort is normal. No respiratory distress.     Breath sounds:  Normal breath sounds.  Skin:    General: Skin is warm and dry.     Findings: No rash.  Psychiatric:        Speech: Speech normal.        Behavior:  Behavior normal.        Thought Content: Thought content normal.      Results for orders placed or performed during the hospital encounter of 01/18/21  ECHOCARDIOGRAM COMPLETE  Result Value Ref Range   S' Lateral 4.17 cm   Ao pk vel 1.13 m/s   AR max vel 4.82 cm2   AV Peak grad 5.1 mmHg   AV Area VTI 5.77 cm2   AV Area mean vel 4.99 cm2   AV Mean grad 3.0 mmHg   Single Plane A4C EF 57.6 %    This visit occurred during the SARS-CoV-2 public health emergency.  Safety protocols were in place, including screening questions prior to the visit, additional usage of staff PPE, and extensive cleaning of exam room while observing appropriate contact time as indicated for disinfecting solutions.   COVID 19 screen:  No recent travel or known exposure to COVID19 The patient denies respiratory symptoms of COVID 19 at this time. The importance of social distancing was discussed today.   Assessment and Plan Problem List Items Addressed This Visit     Hyperlipidemia associated with type 2 diabetes mellitus (HCC) (Chronic)    LDL at goal on atorvastatin 20 mg daily       Relevant Medications   Semaglutide,0.25 or 0.5MG/DOS, (OZEMPIC, 0.25 OR 0.5 MG/DOSE,) 2 MG/1.5ML SOPN   Type 2 diabetes, controlled, with peripheral neuropathy (HCC) (Chronic)    Improved on ozempic. Re-eval A1C in 3 months.       Relevant Medications   Semaglutide,0.25 or 0.5MG/DOS, (OZEMPIC, 0.25 OR 0.5 MG/DOSE,) 2 MG/1.5ML SOPN   HFrEF (heart failure with reduced ejection fraction) (HCC) - Primary    New Dx No recent stress test.No current CP.  Referral pending to cardiology for further eval and recommendations.   BP low in office today.. would not tolerate addition of ACEI  Or BBlocker at this time.  D/C HCTZ as on lasix now.  Follow Bps.. if increased may be able to  optimize therapy.   Now on GLP1 for DM control with additional cardiac benefit.       Hypokalemia    Secondary  To lasix.Marland Kitchen improved now on daily potassium.      Other Visit Diagnoses     Chronic bilateral low back pain with bilateral sciatica       Relevant Medications   ibuprofen (ADVIL) 800 MG tablet      Meds ordered this encounter  Medications   Semaglutide,0.25 or 0.5MG/DOS, (OZEMPIC, 0.25 OR 0.5 MG/DOSE,) 2 MG/1.5ML SOPN    Sig: Inject 0.25 mg into the skin once a week.    Dispense:  6 mL    Refill:  0   ibuprofen (ADVIL) 800 MG tablet    Sig: Take 1 tablet (800 mg total) by mouth every 8 (eight) hours as needed.    Dispense:  60 tablet    Refill:  0        Eliezer Lofts, MD

## 2021-01-21 NOTE — Assessment & Plan Note (Signed)
Improved on ozempic. Re-eval A1C in 3 months.

## 2021-01-21 NOTE — Assessment & Plan Note (Signed)
New Dx No recent stress test.No current CP.  Referral pending to cardiology for further eval and recommendations.   BP low in office today.. would not tolerate addition of ACEI  Or BBlocker at this time.  D/C HCTZ as on lasix now.  Follow Bps.. if increased may be able to optimize therapy.   Now on GLP1 for DM control with additional cardiac benefit.

## 2021-01-25 DIAGNOSIS — J449 Chronic obstructive pulmonary disease, unspecified: Secondary | ICD-10-CM | POA: Diagnosis not present

## 2021-02-02 DIAGNOSIS — M549 Dorsalgia, unspecified: Secondary | ICD-10-CM | POA: Insufficient documentation

## 2021-02-10 ENCOUNTER — Other Ambulatory Visit: Payer: Self-pay | Admitting: Family Medicine

## 2021-02-10 DIAGNOSIS — M48061 Spinal stenosis, lumbar region without neurogenic claudication: Secondary | ICD-10-CM

## 2021-02-10 DIAGNOSIS — M25552 Pain in left hip: Secondary | ICD-10-CM

## 2021-02-10 NOTE — Telephone Encounter (Signed)
Last office visit 01/21/2021 for Arm Pain.  Last refilled 11/26/2020 for #15 with no refills.  No UDS/Contract.   Ozempic was refilled on 01/21/2021 for 6 ml with no refills.  Should not need refills at this time given that is a 3 month supply.

## 2021-02-10 NOTE — Telephone Encounter (Signed)
  Encourage patient to contact the pharmacy for refills or they can request refills through [facility]  LAST APPOINTMENT DATE:  Please schedule appointment if longer than 1 year  NEXT APPOINTMENT DATE:  MEDICATION:Semaglutide,0.25 or 0.5MG /DOS, (OZEMPIC, 0.25 OR 0.5 MG/DOSE,) 2 MG/1.5ML  oxyCODONE-acetaminophen (PERCOCET/ROXICET) 5-325 MG tablet   Is the patient out of medication?   PHARMACY:[facility] [address] [place] [address], [place] - [address]  Let patient know to contact pharmacy at the end of the day to make sure medication is ready.  Please notify patient to allow 48-72 hours to process  CLINICAL FILLS OUT ALL BELOW:   LAST REFILL:  QTY:  REFILL DATE:    OTHER COMMENTS:    Okay for refill?  Please advise

## 2021-02-11 NOTE — Telephone Encounter (Signed)
Tried to call Mr. Jonathon Graham and his voicemail is full so I was unable to leave a message.

## 2021-02-14 ENCOUNTER — Telehealth: Payer: Self-pay | Admitting: Family Medicine

## 2021-02-14 NOTE — Telephone Encounter (Signed)
Pt called stating that he needs a refill on oxyCODONE-acetaminophen (PERCOCET/ROXICET) 5-325 MG tablet, says he is in a lot of pain

## 2021-02-14 NOTE — Telephone Encounter (Signed)
Appointment scheduled for 02/18/2021 10:40 am with Dr. Ermalene Searing for pain management.

## 2021-02-14 NOTE — Telephone Encounter (Signed)
I left patient a voicemail earlier today.  He needs an appointment with Dr. Ermalene Searing before pain medications can be refilled.  Please schedule.

## 2021-02-14 NOTE — Telephone Encounter (Signed)
Left message for Mr. Jonathon Graham that he should have available refills on the Ozempic at his pharmacy.  I also advised that if he is needing a refill on the pain meds prior to his visit in November he will need to call the office and schedule an appointment with Dr. Ermalene Searing to re-evaluate his back and discuss pain management.

## 2021-02-14 NOTE — Telephone Encounter (Signed)
Spoke with patient scheduled appointment

## 2021-02-17 NOTE — Assessment & Plan Note (Signed)
New, Referred for  ECHO given cardiomegaly noted, elevated BNP in hospital and peripheral edema, DOE ON lasix 40 mg daily.

## 2021-02-17 NOTE — Assessment & Plan Note (Signed)
Chronic, improving control on Ozempic.Marland Kitchen tolerating well.

## 2021-02-17 NOTE — Assessment & Plan Note (Signed)
Given Kdur 20 mEQ daily can take every few days.

## 2021-02-17 NOTE — Assessment & Plan Note (Signed)
Due for re-eval with imaging to assure resolution. Pt back at baseline per report.

## 2021-02-18 ENCOUNTER — Encounter: Payer: Self-pay | Admitting: Family Medicine

## 2021-02-18 ENCOUNTER — Ambulatory Visit (INDEPENDENT_AMBULATORY_CARE_PROVIDER_SITE_OTHER): Payer: Medicare HMO | Admitting: Family Medicine

## 2021-02-18 ENCOUNTER — Other Ambulatory Visit: Payer: Self-pay

## 2021-02-18 VITALS — BP 100/74 | HR 66 | Temp 97.5°F | Ht 67.0 in | Wt 290.5 lb

## 2021-02-18 DIAGNOSIS — M25552 Pain in left hip: Secondary | ICD-10-CM

## 2021-02-18 DIAGNOSIS — G8929 Other chronic pain: Secondary | ICD-10-CM

## 2021-02-18 DIAGNOSIS — E1142 Type 2 diabetes mellitus with diabetic polyneuropathy: Secondary | ICD-10-CM | POA: Diagnosis not present

## 2021-02-18 DIAGNOSIS — M48061 Spinal stenosis, lumbar region without neurogenic claudication: Secondary | ICD-10-CM | POA: Diagnosis not present

## 2021-02-18 DIAGNOSIS — M159 Polyosteoarthritis, unspecified: Secondary | ICD-10-CM | POA: Diagnosis not present

## 2021-02-18 DIAGNOSIS — F322 Major depressive disorder, single episode, severe without psychotic features: Secondary | ICD-10-CM

## 2021-02-18 DIAGNOSIS — M545 Low back pain, unspecified: Secondary | ICD-10-CM | POA: Diagnosis not present

## 2021-02-18 DIAGNOSIS — G894 Chronic pain syndrome: Secondary | ICD-10-CM | POA: Diagnosis not present

## 2021-02-18 LAB — POCT GLYCOSYLATED HEMOGLOBIN (HGB A1C): Hemoglobin A1C: 6.4 % — AB (ref 4.0–5.6)

## 2021-02-18 MED ORDER — OXYCODONE-ACETAMINOPHEN 5-325 MG PO TABS: 0.5000 | ORAL_TABLET | Freq: Every day | ORAL | 0 refills | Status: DC | PRN

## 2021-02-18 MED ORDER — OZEMPIC (0.25 OR 0.5 MG/DOSE) 2 MG/1.5ML ~~LOC~~ SOPN: 0.2500 mg | PEN_INJECTOR | SUBCUTANEOUS | 5 refills | Status: DC

## 2021-02-18 NOTE — Assessment & Plan Note (Signed)
Continue Cymbalta but given SE at 60 mg daily ( only taking 3 days a week) try 30 mg daily instead. He will call to re-establish with Dr. Hulan Amato, psychology.

## 2021-02-18 NOTE — Progress Notes (Signed)
Patient ID: Jonathon Graham, male    DOB: 16-Nov-1955, 65 y.o.   MRN: [id number]  This visit was conducted in person.  BP 100/74   Pulse 66   Temp (!) 97.5 F (36.4 C) (Temporal)   Ht 5' 7" (1.702 m)   Wt 290 lb 8 oz (131.8 kg)   BMI 45.50 kg/m    CC: Chief Complaint  Patient presents with   Back Pain   Pain Management    Subjective:   HPI: Jonathon Graham is a 65 y.o. male presenting on 02/18/2021 for Back Pain and Pain Management  Indication for chronic opioid: chronic low back pain, fibromyalgia, OA in multiple joints. Medication and dose:  oxycodone acetaminophen 5/325 mg 1/2 tablet daily prn.. using  about 5 days a week  On gabapentin 300 mg TID  Cymbalta 60 mg daily.. uses three times a week  Flexeril 10 mg q HS # pills per month: 15 Last UDS date:  due Opioid Treatment Agreement signed (Y/N) reviewed and signed Opioid Treatment Agreement last reviewed with patient:   NCCSRS reviewed this encounter (include red flags):     He is interested in restarting PT.  Diabetes:  he has been on ozempic for 1.5 months but for some reason  pharmacy did not get the  prescription. Has been off for 2 weeks. Using medications without difficulties: Hypoglycemic episodes: Hyperglycemic episodes: Feet problems: Blood Sugars averaging: FBS  eye exam within last year:   Wt Readings from Last 3 Encounters:  02/18/21 290 lb 8 oz (131.8 kg)  01/21/21 287 lb 4 oz (130.3 kg)  01/04/21 286 lb (129.7 kg)        Relevant past medical, surgical, family and social history reviewed and updated as indicated. Interim medical history since our last visit reviewed. Allergies and medications reviewed and updated. Outpatient Medications Prior to Visit  Medication Sig Dispense Refill   acetaminophen (TYLENOL) 500 MG tablet Take 1 tablet (500 mg total) by mouth every 8 (eight) hours as needed. 30 tablet 0   albuterol (PROAIR HFA) 108 (90 Base) MCG/ACT inhaler Inhale 1-2 puffs into the lungs  every 4 (four) hours as needed for wheezing or shortness of breath. 1 each 5   albuterol (PROVENTIL) (2.5 MG/3ML) 0.083% nebulizer solution Take 3 mLs (2.5 mg total) by nebulization every 6 (six) hours as needed for wheezing or shortness of breath. 360 mL 6   aspirin 81 MG tablet Take 243 mg by mouth daily. Takes 4 25m aspirin daily.     atorvastatin (LIPITOR) 20 MG tablet Take 1 tablet (20 mg total) by mouth daily. 90 tablet 3   betamethasone valerate ointment (VALISONE) 0.1 % Apply 1 application topically 2 (two) times daily. For up to 10 days. 30 g 0   blood glucose meter kit and supplies KIT Dispense based on patient and insurance preference. Use up to four times daily as directed. (FOR ICD-9 250.00, 250.01). 1 each 0   Cholecalciferol (VITAMIN D3) 1.25 MG (50000 UT) CAPS TAKE 1 CAPSULE BY MOUTH  ONCE A WEEK 12 capsule 0   clotrimazole (LOTRIMIN) 1 % cream Apply 1 application 2 (two) times daily topically. For 7 days 28 g 0   cyclobenzaprine (FLEXERIL) 10 MG tablet Take 1 tablet (10 mg total) by mouth at bedtime as needed for muscle spasms. 15 tablet 0   DULoxetine (CYMBALTA) 60 MG capsule Take 1 capsule (60 mg total) by mouth daily. 90 capsule 1   Elastic Bandages & Supports (MEDICAL  COMPRESSION SOCKS) MISC 1 each by Does not apply route daily. 1 each 0   fluticasone-salmeterol (ADVAIR) 250-50 MCG/ACT AEPB Inhale 1 puff into the lungs every 12 (twelve) hours. 60 each 11   furosemide (LASIX) 40 MG tablet Take 1 tablet (40 mg total) by mouth daily. 90 tablet 3   gabapentin (NEURONTIN) 300 MG capsule Take 1 capsule (300 mg total) by mouth 3 (three) times daily. 270 capsule 0   ibuprofen (ADVIL) 800 MG tablet Take 1 tablet (800 mg total) by mouth every 8 (eight) hours as needed. 60 tablet 0   Misc. Devices (WRIST BRACE) MISC 2 Units by Does not apply route. Wear on wrists as needed for support     ondansetron (ZOFRAN) 8 MG tablet Take 1 tablet (8 mg total) by mouth every 8 (eight) hours as needed  for nausea or vomiting. 12 tablet 0   oxyCODONE-acetaminophen (PERCOCET/ROXICET) 5-325 MG tablet Take 0.5 tablets by mouth daily as needed for severe pain. 15 tablet 0   potassium chloride SA (KLOR-CON) 20 MEQ tablet Take 1 tablet (20 mEq total) by mouth daily. 90 tablet 0   Semaglutide,0.25 or 0.5MG/DOS, (OZEMPIC, 0.25 OR 0.5 MG/DOSE,) 2 MG/1.5ML SOPN Inject 0.25 mg into the skin once a week. 6 mL 0   tamsulosin (FLOMAX) 0.4 MG CAPS capsule Take 1 capsule (0.4 mg total) by mouth daily. 90 capsule 3   No facility-administered medications prior to visit.     Per HPI unless specifically indicated in ROS section below Review of Systems  Constitutional:  Negative for fatigue and fever.  HENT:  Negative for ear pain.   Eyes:  Negative for pain.  Respiratory:  Negative for cough and shortness of breath.   Cardiovascular:  Negative for chest pain, palpitations and leg swelling.  Gastrointestinal:  Negative for abdominal pain.  Genitourinary:  Negative for dysuria.  Musculoskeletal:  Negative for arthralgias.  Neurological:  Negative for syncope, light-headedness and headaches.  Psychiatric/Behavioral:  Negative for dysphoric mood.   Objective:  BP 100/74   Pulse 66   Temp (!) 97.5 F (36.4 C) (Temporal)   Ht 5' 7" (1.702 m)   Wt 290 lb 8 oz (131.8 kg)   BMI 45.50 kg/m   Wt Readings from Last 3 Encounters:  02/18/21 290 lb 8 oz (131.8 kg)  01/21/21 287 lb 4 oz (130.3 kg)  01/04/21 286 lb (129.7 kg)      Physical Exam Constitutional:      Appearance: He is well-developed. He is obese.  HENT:     Head: Normocephalic.     Right Ear: Hearing normal.     Left Ear: Hearing normal.     Nose: Nose normal.  Neck:     Thyroid: No thyroid mass or thyromegaly.     Vascular: No carotid bruit.     Trachea: Trachea normal.  Cardiovascular:     Rate and Rhythm: Normal rate and regular rhythm.     Pulses: Normal pulses.     Heart sounds: Heart sounds not distant. No murmur heard.   No  friction rub. No gallop.     Comments: No peripheral edema Pulmonary:     Effort: Pulmonary effort is normal. No respiratory distress.     Breath sounds: Normal breath sounds.  Skin:    General: Skin is warm and dry.     Findings: No rash.  Psychiatric:        Speech: Speech normal.        Behavior: Behavior  normal.        Thought Content: Thought content normal.      Results for orders placed or performed during the hospital encounter of 01/18/21  ECHOCARDIOGRAM COMPLETE  Result Value Ref Range   S' Lateral 4.17 cm   Ao pk vel 1.13 m/s   AR max vel 4.82 cm2   AV Peak grad 5.1 mmHg   AV Area VTI 5.77 cm2   AV Area mean vel 4.99 cm2   AV Mean grad 3.0 mmHg   Single Plane A4C EF 57.6 %    This visit occurred during the SARS-CoV-2 public health emergency.  Safety protocols were in place, including screening questions prior to the visit, additional usage of staff PPE, and extensive cleaning of exam room while observing appropriate contact time as indicated for disinfecting solutions.   COVID 19 screen:  No recent travel or known exposure to COVID19 The patient denies respiratory symptoms of COVID 19 at this time. The importance of social distancing was discussed today.   Assessment and Plan Problem List Items Addressed This Visit     Depression, major, single episode, severe ([place]) (Chronic)    Continue Cymbalta but given SE at 60 mg daily ( only taking 3 days a week) try 30 mg daily instead. He will call to re-establish with Dr. Ceasar Lund, psychology.      Type 2 diabetes, controlled, with peripheral neuropathy ([place]) - Primary (Chronic)     Has been on ozempic for 1.5 months but has bee n off again as without a prescription for 2 weeks.  Will eval A1C today.      Relevant Medications   Semaglutide,0.25 or 0.5MG/DOS, (OZEMPIC, 0.25 OR 0.5 MG/DOSE,) 2 MG/1.5ML SOPN   Chronic midline low back pain   Relevant Orders   Ambulatory referral to Physical Therapy   Chronic pain  syndrome    ndication for chronic opioid: chronic low back pain, fibromyalgia, OA in multiple joints. Medication and dose:  oxycodone acetaminophen 5/325 mg 1/2 tablet daily prn.. using  about 5 days a week  On gabapentin 300 mg TID  Cymbalta 60 mg daily.. uses three times a week  Flexeril 10 mg q HS # pills per month: 15 Last UDS date:  due Opioid Treatment Agreement signed (Y/N) reviewed and signed Opioid Treatment Agreement last reviewed with patient:   NCCSRS reviewed this encounter (include red flags):     He is interested in restarting PT... referral sent.      OA (osteoarthritis)   Meds ordered this encounter  Medications   Semaglutide,0.25 or 0.5MG/DOS, (OZEMPIC, 0.25 OR 0.5 MG/DOSE,) 2 MG/1.5ML SOPN    Sig: Inject 0.25 mg into the skin once a week.    Dispense:  6 mL    Refill:  5   DISCONTD: oxyCODONE-acetaminophen (PERCOCET/ROXICET) 5-325 MG tablet    Sig: Take 0.5 tablets by mouth daily as needed for severe pain.    Dispense:  15 tablet    Refill:  0   DISCONTD: oxyCODONE-acetaminophen (PERCOCET/ROXICET) 5-325 MG tablet    Sig: Take 0.5 tablets by mouth daily as needed for severe pain. Do not fill until 03/20/2021    Dispense:  15 tablet    Refill:  0   oxyCODONE-acetaminophen (PERCOCET/ROXICET) 5-325 MG tablet    Sig: Take 0.5 tablets by mouth daily as needed for severe pain. Do not fill until 04/20/2021    Dispense:  15 tablet    Refill:  0       Sajad Glander,  MD

## 2021-02-18 NOTE — Assessment & Plan Note (Signed)
Has been on ozempic for 1.5 months but has bee n off again as without a prescription for 2 weeks.  Will eval A1C today.

## 2021-02-18 NOTE — Assessment & Plan Note (Signed)
ndication for chronic opioid: chronic low back pain, fibromyalgia, OA in multiple joints. Medication and dose:  oxycodone acetaminophen 5/325 mg 1/2 tablet daily prn.. using  about 5 days a week  On gabapentin 300 mg TID  Cymbalta 60 mg daily.. uses three times a week  Flexeril 10 mg q HS # pills per month: 15 Last UDS date:  due Opioid Treatment Agreement signed (Y/N) reviewed and signed Opioid Treatment Agreement last reviewed with patient:   NCCSRS reviewed this encounter (include red flags):     He is interested in restarting PT... referral sent.

## 2021-02-18 NOTE — Patient Instructions (Addendum)
Can try cymbalta low dose 30 mg  every day to see if less side effects.  Call to re-set up counseling with [facility].  Referral sent for PT.

## 2021-02-18 NOTE — Addendum Note (Signed)
Addended by: Damita Lack on: 02/18/2021 12:01 PM   Modules accepted: Orders

## 2021-02-19 LAB — DM TEMPLATE

## 2021-02-19 LAB — DRUG MONITORING, PANEL 8 WITH CONFIRMATION, URINE
6 Acetylmorphine: NEGATIVE ng/mL (ref <10)
Alcohol Metabolites: NEGATIVE ng/mL (ref <500)
Amphetamines: NEGATIVE ng/mL (ref <500)
Benzodiazepines: NEGATIVE ng/mL (ref <100)
Buprenorphine, Urine: NEGATIVE ng/mL (ref <5)
Cocaine Metabolite: NEGATIVE ng/mL (ref <150)
Creatinine: 96.8 mg/dL (ref >=20.0)
MDMA: NEGATIVE ng/mL (ref <500)
Marijuana Metabolite: NEGATIVE ng/mL (ref <20)
Opiates: NEGATIVE ng/mL (ref <100)
Oxidant: NEGATIVE ug/mL (ref <200)
Oxycodone: NEGATIVE ng/mL (ref <100)
pH: 5.2 (ref 4.5–9.0)

## 2021-02-23 ENCOUNTER — Ambulatory Visit: Payer: Medicare HMO | Attending: Family Medicine | Admitting: Rehabilitative and Restorative Service Providers"

## 2021-02-23 ENCOUNTER — Other Ambulatory Visit: Payer: Self-pay

## 2021-02-23 ENCOUNTER — Encounter: Payer: Self-pay | Admitting: Rehabilitative and Restorative Service Providers"

## 2021-02-23 DIAGNOSIS — M6281 Muscle weakness (generalized): Secondary | ICD-10-CM | POA: Insufficient documentation

## 2021-02-23 DIAGNOSIS — M545 Low back pain, unspecified: Secondary | ICD-10-CM | POA: Insufficient documentation

## 2021-02-23 DIAGNOSIS — M629 Disorder of muscle, unspecified: Secondary | ICD-10-CM | POA: Diagnosis not present

## 2021-02-23 DIAGNOSIS — G8929 Other chronic pain: Secondary | ICD-10-CM | POA: Insufficient documentation

## 2021-02-23 DIAGNOSIS — R252 Cramp and spasm: Secondary | ICD-10-CM | POA: Diagnosis not present

## 2021-02-23 DIAGNOSIS — R262 Difficulty in walking, not elsewhere classified: Secondary | ICD-10-CM | POA: Diagnosis not present

## 2021-02-23 NOTE — Patient Instructions (Signed)
Access Code: [email or web address] URL: [email or web address] Date: 02/23/2021 Prepared by: Clydie Braun Aarohi Redditt  Exercises Supine Hamstring Stretch with Strap - 1-2 x daily - 7 x weekly - 1 sets - 3 reps Supine Piriformis Stretch with Foot on Ground - 1-2 x daily - 7 x weekly - 1 sets - 3 reps Supine Posterior Pelvic Tilt - 1-2 x daily - 7 x weekly - 2 sets - 10 reps Supine Bridge - 1-2 x daily - 7 x weekly - 2 sets - 10 reps

## 2021-02-23 NOTE — Therapy (Signed)
[facility] [address] [place], [place], [address] Phone: [phone]   Fax:  [phone]  Physical Therapy Evaluation  Patient Details  Name: Jonathon Graham MRN: [id number] Date of Birth: Nov 14, 1955 Referring Provider (PT): Dr Kerby Nora   Encounter Date: 02/23/2021   PT End of Session - 02/23/21 1322     Visit Number 1    Number of Visits 16    Date for PT Re-Evaluation 04/15/21    Authorization Type [employer] and [employer]    PT Start Time 1145    PT Stop Time 1225    PT Time Calculation (min) 40 min    Activity Tolerance Patient tolerated treatment well    Behavior During Therapy [facility] for tasks assessed/performed             Past Medical History:  Diagnosis Date   Arthritis    Benign prostatic hyperplasia (BPH) with urinary urgency    nodular   COPD (chronic obstructive pulmonary disease) (HCC)    Depression    Diabetes mellitus without complication (HCC)    Essential hypertension    Fibromyalgia, primary    Hyperlipidemia    Hypertension    Obesity     History reviewed. No pertinent surgical history.  There were no vitals filed for this visit.    Subjective Assessment - 02/23/21 1151     Subjective Pt states that he has chronic LBP.  He states that he was told that if he had back surgery, there was a 60% chance of a successful surgery.  He has opted not to have surgery and wants to continue conservative treatment of PT.  He reports that he had successful PT about a year ago. Pt reports that he has been on disability for approx 3 years after getting hurt while working.    Pertinent History COPD, Fibromyalgia, OA    Limitations Walking;Standing    How long can you stand comfortably? about 10-15 min    How long can you walk comfortably? 100 ft    Patient Stated Goals To improve my strength of my  muscles and decrease my pain.    Currently in Pain? Yes    Pain Score 8     Pain Location Back    Pain  Orientation Lower    Pain Descriptors / Indicators Aching    Pain Type Chronic pain                [facility] PT Assessment - 02/23/21 0001       Assessment   Medical Diagnosis M54.50,G89.29 (ICD-10-CM) - Chronic midline low back pain, unspecified whether sciatica present    Referring Provider (PT) Dr Kerby Nora    Hand Dominance Right    Next MD Visit in appr 4 weeks    Prior Therapy yes, last year for back      Precautions   Precautions Fall      Restrictions   Weight Bearing Restrictions No      Balance Screen   Has the patient fallen in the past 6 months Yes    How many times? 1   fell off a ladder   Has the patient had a decrease in activity level because of a fear of falling?  No    Is the patient reluctant to leave their home because of a fear of falling?  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     ROM / Strength   AROM / PROM / Strength Strength      Strength   Overall Strength Comments L hip/knee strength of 3+/5.  RLE strenth of 4 to 4+/5      Flexibility   Soft Tissue Assessment /Muscle Length yes    Hamstrings tight    Piriformis tight      Transfers   Five time sit to stand comments  22.7 sec   with UE use                       Objective measurements completed on examination: See above findings.                PT Education -  02/23/21 1229     Education Details Pt provided HEP    Person(s) Educated Patient    Methods Explanation;Demonstration;Handout    Comprehension Verbalized understanding;Returned demonstration              PT Short Term Goals - 02/23/21 1331       PT SHORT TERM GOAL #1   Title Pt will be independent with initial HEP.    Time 2    Period Weeks    Status New               PT Long Term Goals - 02/23/21 1332       PT LONG TERM GOAL #1   Title Pt will be independent with advanced HEP.    Time 8    Period Weeks    Status New      PT LONG TERM GOAL #2   Title Pt will increase LLE strength to at least 4+/5 to allow him to more easily perform transfers and ambulation.    Time 8    Period Weeks    Status New      PT LONG TERM GOAL #3   Title Pt will increase 5 times sit to/from stant to 14 seconds or less to allow him improved functional mobility.    Time 8    Period Weeks    Status New      PT LONG TERM GOAL #4   Title Pt will have BERG of 50/56 to place him at a low risk of falling.    Time 8    Period Weeks    Status New                    Plan - 02/23/21 1324     Clinical Impression Statement Pt is a 65 y.o. male who presents to outpatient PT with a referral from Dr Kerby Nora secondary to low back pain.  He reports that approx a year ago, he went to PT for his back, and it was successful at that time.  He reports that  earlier this year, he had COVID and a month later Pneumonia and pt with dyspnea with exertion.  His PLOF was able to negotiate his home and ambulate without assistive device.  He states that with his current pain, he has been unable to bathe himself independently and his wife has been assisting.  Pt presents with muscle weakness, HS and piriformis tightness, increased pain, postural abnormalities, difficulty walking, and decreased independence with functional mobility.  He would benefit from skilled PT to address his functional  impairments to allow him to return to a decreased pain lifestyle.    Personal Factors and Comorbidities Age;Fitness    Examination-Activity Limitations Locomotion Level;Sleep;Stairs;Stand    Nurse, children's;Shop    Stability/Clinical Decision Making Evolving/Moderate complexity    Clinical Decision Making Moderate    Rehab Potential Good    PT Frequency 2x / week    PT Duration 8 weeks    PT Treatment/Interventions ADLs/Self Care Home Management;Cryotherapy;Electrical Stimulation;Iontophoresis 4mg /ml Dexamethasone;Moist Heat;Traction;Gait training;Stair training;Functional mobility training;Therapeutic activities;Therapeutic exercise;Balance training;Neuromuscular re-education;Patient/family education;Manual techniques;Passive range of motion;Dry needling;Taping;Spinal Manipulations;Joint Manipulations    PT Next Visit Plan assess and progress HEP, core stability, strengthening    PT Home Exercise Plan Access Code:    Consulted and Agree with Plan of Care Patient             Patient will benefit from skilled therapeutic intervention in order to improve the following deficits and impairments:  Difficulty walking, Increased muscle spasms, Obesity, Decreased activity tolerance, Pain, Decreased balance, Impaired flexibility, Decreased strength, Postural dysfunction  Visit Diagnosis: Chronic midline low back pain, unspecified whether sciatica present - Plan: PT plan of care cert/re-cert  Muscle weakness (generalized) - Plan: PT plan of care cert/re-cert  Difficulty in walking, not elsewhere classified - Plan: PT plan of care cert/re-cert  Cramp and spasm - Plan: PT plan of care cert/re-cert  Hamstring tightness of both lower extremities - Plan: PT plan of care cert/re-cert     Problem List Patient Active Problem List   Diagnosis Date Noted   Chronic pain syndrome 02/18/2021   HFrEF (heart failure with reduced ejection fraction)  (HCC) 01/21/2021   Hypokalemia 12/07/2020   Bradycardia 12/07/2020   Cardiomegaly 12/07/2020   Community acquired pneumonia of right lung 12/07/2020   Chronic midline low back pain 07/27/2020   Psoriasis 07/27/2020   Diabetic neuropathy (HCC) 07/27/2020   OA (osteoarthritis) 05/02/2019   Depression, major, single episode, severe (HCC) 08/23/2018   Acute respiratory failure (HCC) 03/24/2017   Obesity hypoventilation syndrome (HCC) 03/14/2017   Right shoulder pain 11/29/2016   BPH (benign prostatic hyperplasia) 03/14/2016   Hypertension associated with diabetes (HCC) 03/14/2016   Fibromyalgia 03/14/2016   Hyperlipidemia associated with type 2 diabetes mellitus (HCC) 03/14/2016   Insomnia 03/14/2016   COPD (chronic obstructive pulmonary disease) (HCC) 11/26/2015   Severe obstructive sleep apnea 06/15/2015   BMI 45.0-49.9, adult (HCC) 05/26/2015   Type 2 diabetes, controlled, with peripheral neuropathy (HCC) 05/04/2015   Left hip pain 03/16/2015    03/18/2015, PT, DPT 02/23/2021, 1:38 PM  [facility] [address] [place], [place], [place] Phone: [phone]   Fax:  [phone]  Name: Jonathon Graham MRN: Lora [id number] Date of Birth: Dec 29, 1955

## 2021-02-25 DIAGNOSIS — J449 Chronic obstructive pulmonary disease, unspecified: Secondary | ICD-10-CM | POA: Diagnosis not present

## 2021-03-01 ENCOUNTER — Other Ambulatory Visit: Payer: Self-pay | Admitting: Family Medicine

## 2021-03-01 DIAGNOSIS — E1142 Type 2 diabetes mellitus with diabetic polyneuropathy: Secondary | ICD-10-CM

## 2021-03-01 NOTE — Telephone Encounter (Signed)
Refill request Gabapentin Last refill 10/19/20 #270 Last office visit 02/18/21

## 2021-03-02 ENCOUNTER — Ambulatory Visit: Payer: Medicare HMO | Admitting: Physical Therapy

## 2021-03-04 ENCOUNTER — Ambulatory Visit: Payer: Medicare HMO | Admitting: Physical Therapy

## 2021-03-08 ENCOUNTER — Encounter: Payer: Self-pay | Admitting: Cardiovascular Disease

## 2021-03-08 ENCOUNTER — Ambulatory Visit (INDEPENDENT_AMBULATORY_CARE_PROVIDER_SITE_OTHER): Payer: Medicare HMO | Admitting: Cardiovascular Disease

## 2021-03-08 ENCOUNTER — Other Ambulatory Visit: Payer: Self-pay

## 2021-03-08 DIAGNOSIS — I519 Heart disease, unspecified: Secondary | ICD-10-CM

## 2021-03-08 DIAGNOSIS — E1169 Type 2 diabetes mellitus with other specified complication: Secondary | ICD-10-CM

## 2021-03-08 DIAGNOSIS — G4733 Obstructive sleep apnea (adult) (pediatric): Secondary | ICD-10-CM

## 2021-03-08 DIAGNOSIS — E785 Hyperlipidemia, unspecified: Secondary | ICD-10-CM

## 2021-03-08 MED ORDER — METOPROLOL TARTRATE 25 MG PO TABS: 12.5000 mg | ORAL_TABLET | Freq: Two times a day (BID) | ORAL | 0 refills | Status: DC

## 2021-03-08 NOTE — Assessment & Plan Note (Signed)
History of hyperlipidemia on statin therapy with lipid profile performed 10/19/2020 revealing total cholesterol 104, LDL 49 HDL of 39.

## 2021-03-08 NOTE — Assessment & Plan Note (Signed)
History of obstructive sleep apnea on CPAP. 

## 2021-03-08 NOTE — Progress Notes (Signed)
   03/08/2021 Jonathon Graham   01/29/1956  [id number]  Primary Physician Bedsole, Amy E, MD Primary Cardiologist: Jonathan J Berry MD FACP, FACC, FAHA, FSCAI  HPI:  Jonathon Graham is a 65 y.o. severely overweight married African-American male father of 3, grandfather of 10 grandchildren and great grandfather to 12 great grandchildren was referred by [name], MD, for evaluation of LV dysfunction.  He is disabled because of back issues.  His risk factors include 15 pack years of tobacco abuse having quit last year, treated hyperlipidemia and diabetes.  There is no family history of heart disease.  He is never had a heart tach or stroke.  He denies chest pain or shortness of breath.  He just renovated his house by himself without limitation.  He has lost 100 pounds over the last year to by diet and exercise.  He also contracted COVID back in April of this year and only had minimal symptoms.  A 2D echocardiogram performed 01/18/2021 revealed an EF of 40 to 45% without valvular abnormalities.  Etiology of his LV dysfunction is still unclear.   Current Meds  Medication Sig   albuterol (PROAIR HFA) 108 (90 Base) MCG/ACT inhaler Inhale 1-2 puffs into the lungs every 4 (four) hours as needed for wheezing or shortness of breath.   albuterol (PROVENTIL) (2.5 MG/3ML) 0.083% nebulizer solution Take 3 mLs (2.5 mg total) by nebulization every 6 (six) hours as needed for wheezing or shortness of breath.   aspirin 81 MG tablet Take 243 mg by mouth daily. Takes 4 81mg aspirin daily.   atorvastatin (LIPITOR) 20 MG tablet Take 1 tablet (20 mg total) by mouth daily.   betamethasone valerate ointment (VALISONE) 0.1 % Apply 1 application topically 2 (two) times daily. For up to 10 days.   blood glucose meter kit and supplies KIT Dispense based on patient and insurance preference. Use up to four times daily as directed. (FOR ICD-9 250.00, 250.01).   Cholecalciferol (VITAMIN D3) 1.25 MG (50000 UT) CAPS TAKE 1 CAPSULE  BY MOUTH  ONCE A WEEK   clotrimazole (LOTRIMIN) 1 % cream Apply 1 application 2 (two) times daily topically. For 7 days   cyclobenzaprine (FLEXERIL) 10 MG tablet Take 1 tablet (10 mg total) by mouth at bedtime as needed for muscle spasms.   DULoxetine (CYMBALTA) 60 MG capsule Take 1 capsule (60 mg total) by mouth daily.   Elastic Bandages & Supports (MEDICAL COMPRESSION SOCKS) MISC 1 each by Does not apply route daily.   fluticasone-salmeterol (ADVAIR) 250-50 MCG/ACT AEPB Inhale 1 puff into the lungs every 12 (twelve) hours.   furosemide (LASIX) 40 MG tablet Take 1 tablet (40 mg total) by mouth daily.   gabapentin (NEURONTIN) 300 MG capsule TAKE 1 CAPSULE BY MOUTH THREE TIMES DAILY   ibuprofen (ADVIL) 800 MG tablet Take 1 tablet (800 mg total) by mouth every 8 (eight) hours as needed.   Misc. Devices (WRIST BRACE) MISC 2 Units by Does not apply route. Wear on wrists as needed for support   ondansetron (ZOFRAN) 8 MG tablet Take 1 tablet (8 mg total) by mouth every 8 (eight) hours as needed for nausea or vomiting.   oxyCODONE-acetaminophen (PERCOCET/ROXICET) 5-325 MG tablet Take 0.5 tablets by mouth daily as needed for severe pain. Do not fill until 04/20/2021   potassium chloride SA (KLOR-CON) 20 MEQ tablet Take 1 tablet (20 mEq total) by mouth daily.   Semaglutide,0.25 or 0.5MG/DOS, (OZEMPIC, 0.25 OR 0.5 MG/DOSE,) 2 MG/1.5ML SOPN Inject 0.25 mg   into the skin once a week.   tamsulosin (FLOMAX) 0.4 MG CAPS capsule Take 1 capsule (0.4 mg total) by mouth daily.     Allergies  Allergen Reactions   Penicillins Anaphylaxis and Swelling    Social History   Socioeconomic History   Marital status: Married    Spouse name: Not on file   Number of children: Not on file   Years of education: Not on file   Highest education level: Not on file  Occupational History   Occupation: retired  Tobacco Use   Smoking status: Former    Types: Cigarettes    Quit date: 03/28/2017    Years since quitting:  3.9   Smokeless tobacco: Never  Substance and Sexual Activity   Alcohol use: No    Alcohol/week: 0.0 standard drinks   Drug use: No   Sexual activity: Yes  Other Topics Concern   Not on file  Social History Narrative   Not on file   Social Determinants of Health   Financial Resource Strain: Low Risk    Difficulty of Paying Living Expenses: Not hard at all  Food Insecurity: No Food Insecurity   Worried About Charity fundraiser in the Last Year: Never true   [place] in the Last Year: Never true  Transportation Needs: No Transportation Needs   Lack of Transportation (Medical): No   Lack of Transportation (Non-Medical): No  Physical Activity: Inactive   Days of Exercise per Week: 0 days   Minutes of Exercise per Session: 0 min  Stress: Stress Concern Present   Feeling of Stress : To some extent  Social Connections: Not on file  Intimate Partner Violence: Not At Risk   Fear of Current or Ex-Partner: No   Emotionally Abused: No   Physically Abused: No   Sexually Abused: No     Review of Systems: General: negative for chills, fever, night sweats or weight changes.  Cardiovascular: negative for chest pain, dyspnea on exertion, edema, orthopnea, palpitations, paroxysmal nocturnal dyspnea or shortness of breath Dermatological: negative for rash Respiratory: negative for cough or wheezing Urologic: negative for hematuria Abdominal: negative for nausea, vomiting, diarrhea, bright red blood per rectum, melena, or hematemesis Neurologic: negative for visual changes, syncope, or dizziness All other systems reviewed and are otherwise negative except as noted above.    Blood pressure 110/78, pulse 94, height 5' 7" (1.702 m), weight 287 lb 9.6 oz (130.5 kg), SpO2 97 %.  General appearance: alert and no distress Neck: no adenopathy, no carotid bruit, no JVD, supple, symmetrical, trachea midline, and thyroid not enlarged, symmetric, no tenderness/mass/nodules Lungs: clear to  auscultation bilaterally Heart: regular rate and rhythm, S1, S2 normal, no murmur, click, rub or gallop Extremities: extremities normal, atraumatic, no cyanosis or edema Pulses: 2+ and symmetric Skin: Skin color, texture, turgor normal. No rashes or lesions Neurologic: Grossly normal  EKG sinus rhythm at 94 with right bundle branch block.  I personally reviewed this EKG.  ASSESSMENT AND PLAN:   Severe obstructive sleep apnea History of obstructive sleep apnea on CPAP  Hyperlipidemia associated with type 2 diabetes mellitus ([place]) History of hyperlipidemia on statin therapy with lipid profile performed 10/19/2020 revealing total cholesterol 104, LDL 49 HDL of 39.  Left ventricular dysfunction Recent 2D echo performed 01/18/2021 revealed an EF of 40 to 45% without valvular abnormalities.  There was mildly elevated pulmonary artery pressures.  He does have COPD but really denies chest pain or shortness of breath.  The  etiology of his LV dysfunction is still undetermined.  I am going to get a coronary CTA to rule out an ischemic etiology but I suspect this is nonischemic.  His blood pressures are fairly soft, and he has a history of bradycardia.  I make it going to begin him on carvedilol 3.125 mg p.o. twice daily and will have him check his blood pressure on a daily basis.  We will follow-up with a Pharm.D. in 2 weeks for medication titration and to see whether he is tolerating the medication given his COPD.  I am not sure we will ever be able to initiate Entresto as well given his soft blood pressure.     Jonathan J. Berry MD FACP,FACC,FAHA, FSCAI 03/08/2021 9:23 AM  

## 2021-03-08 NOTE — Assessment & Plan Note (Addendum)
Recent 2D echo performed 01/18/2021 revealed an EF of 40 to 45% without valvular abnormalities.  There was mildly elevated pulmonary artery pressures.  He does have COPD but really denies chest pain or shortness of breath.  The etiology of his LV dysfunction is still undetermined.  I am going to get a coronary CTA to rule out an ischemic etiology but I suspect this is nonischemic.  His blood pressures are fairly soft, and he has a history of bradycardia.  I make it going to begin him on metoprolol 12.5 mg mg p.o. twice daily and will have him check his blood pressure on a daily basis.  We will follow-up with a Pharm.D. in 2 weeks for medication titration and to see whether he is tolerating the medication given his COPD.  I am not sure we will ever be able to initiate Entresto as well given his soft blood pressure.

## 2021-03-08 NOTE — Patient Instructions (Signed)
Medication Instructions:   -Start taking metoprolol tartrate (lopressor) 12.5mg  twice daily.   *If you need a refill on your cardiac medications before your next appointment, please call your pharmacy*   Lab Work: Your physician recommends that you return for lab work in: within 7 days of coronary CTA for BMET  If you have labs (blood work) drawn today and your tests are completely normal, you will receive your results only by: MyChart Message (if you have MyChart) OR A paper copy in the mail If you have any lab test that is abnormal or we need to change your treatment, we will call you to review the results.   Testing/Procedures:   Your cardiac CT will be scheduled at one of the below locations:   [facility] [address] [place], [place] [address] [phone]  If scheduled at [facility], please arrive at the [facility] main entrance (entrance A) of [facility] 30 minutes prior to test start time. Proceed to the [facility] Radiology Department (first floor) to check-in and test prep.  Please follow these instructions carefully (unless otherwise directed):  Hold all erectile dysfunction medications at least 3 days (72 hrs) prior to test.  On the Night Before the Test: Be sure to Drink plenty of water. Do not consume any caffeinated/decaffeinated beverages or chocolate 12 hours prior to your test. Do not take any antihistamines 12 hours prior to your test.  On the Day of the Test: Drink plenty of water until 1 hour prior to the test. Do not eat any food 4 hours prior to the test. You may take your regular medications prior to the test.  Take metoprolol (Lopressor) 25mg  two hours prior to test. HOLD Furosemide/Hydrochlorothiazide morning of the test.       After the Test: Drink plenty of water. After receiving IV contrast, you may experience a mild flushed feeling. This is normal. On occasion, you may experience a mild rash up to 24  hours after the test. This is not dangerous. If this occurs, you can take Benadryl 25 mg and increase your fluid intake. If you experience trouble breathing, this can be serious. If it is severe call 911 IMMEDIATELY. If it is mild, please call our office. If you take any of these medications: Glipizide/Metformin, Avandament, Glucavance, please do not take 48 hours after completing test unless otherwise instructed.  Please allow 2-4 weeks for scheduling of routine cardiac CTs. Some insurance companies require a pre-authorization which may delay scheduling of this test.   For non-scheduling related questions, please contact the cardiac imaging nurse navigator should you have any questions/concerns: , Cardiac Imaging Nurse Navigator [place], Cardiac Imaging Nurse Navigator [facility] Direct Office Dial: [phone]   For scheduling needs, including cancellations and rescheduling, please call [phone], [phone].  Your physician has requested that you have an echocardiogram. Echocardiography is a painless test that uses sound waves to create images of your heart. It provides your doctor with information about the size and shape of your heart and how well your heart's chambers and valves are working. This procedure takes approximately one hour. There are no restrictions for this procedure. To be done in 3 months. This procedure is done at [address] [phone].    Follow-Up: At [facility], you and your health needs are our priority.  As part of our continuing mission to provide you with exceptional heart care, we have created designated Provider Care Teams.  These Care Teams include your primary Cardiologist (physician) and Advanced Practice Providers (APPs -  Physician Assistants and Nurse Practitioners) who all work together to provide you with the care you need, when you need it.  We recommend signing up for the patient portal called "MyChart".   Sign up information is provided on this After Visit Summary.  MyChart is used to connect with patients for Virtual Visits (Telemedicine).  Patients are able to view lab/test results, encounter notes, upcoming appointments, etc.  Non-urgent messages can be sent to your provider as well.   To learn more about what you can do with MyChart, go to [email or web address].    Your next appointment:   3 month(s)  The format for your next appointment:   In Person  Provider:   Nanetta Batty, MD   Other Instructions Dr. Allyson Sabal has requested that you schedule an appointment with one of our clinical pharmacists for a blood pressure check appointment within the next 2 weeks.  If you monitor your blood pressure (BP) at home, please bring your BP cuff and your BP readings with you to this appointment  HOW TO TAKE YOUR BLOOD PRESSURE: Rest 5 minutes before taking your blood pressure. Don't smoke or drink caffeinated beverages for at least 30 minutes before. Take your blood pressure before (not after) you eat. Sit comfortably with your back supported and both feet on the floor (don't cross your legs). Elevate your arm to heart level on a table or a desk. Use the proper sized cuff. It should fit smoothly and snugly around your bare upper arm. There should be enough room to slip a fingertip under the cuff. The bottom edge of the cuff should be 1 inch above the crease of the elbow. Ideally, take 3 measurements at one sitting and record the average.

## 2021-03-10 ENCOUNTER — Telehealth: Payer: Self-pay | Admitting: Cardiovascular Disease

## 2021-03-10 ENCOUNTER — Ambulatory Visit: Payer: Medicare HMO | Attending: Family Medicine | Admitting: Physical Therapy

## 2021-03-10 ENCOUNTER — Other Ambulatory Visit: Payer: Self-pay

## 2021-03-10 DIAGNOSIS — R252 Cramp and spasm: Secondary | ICD-10-CM | POA: Diagnosis not present

## 2021-03-10 DIAGNOSIS — G8929 Other chronic pain: Secondary | ICD-10-CM | POA: Insufficient documentation

## 2021-03-10 DIAGNOSIS — M545 Low back pain, unspecified: Secondary | ICD-10-CM | POA: Insufficient documentation

## 2021-03-10 DIAGNOSIS — M6281 Muscle weakness (generalized): Secondary | ICD-10-CM | POA: Diagnosis not present

## 2021-03-10 DIAGNOSIS — R262 Difficulty in walking, not elsewhere classified: Secondary | ICD-10-CM | POA: Insufficient documentation

## 2021-03-10 DIAGNOSIS — M629 Disorder of muscle, unspecified: Secondary | ICD-10-CM | POA: Insufficient documentation

## 2021-03-10 MED ORDER — METOPROLOL TARTRATE 25 MG PO TABS: 12.5000 mg | ORAL_TABLET | Freq: Two times a day (BID) | ORAL | 0 refills | Status: DC

## 2021-03-10 NOTE — Therapy (Signed)
[facility] [address] , [place], [address] Phone: [phone]   Fax:  [phone]  Physical Therapy Treatment  Patient Details  Name: Jonathon Graham MRN: [id number] Date of Birth: 02/23/1956 Referring Provider (PT): Dr Amy Bedsole   Encounter Date: 03/10/2021   PT End of Session - 03/10/21 0952     Visit Number 2    Number of Visits 16    Date for PT Re-Evaluation 04/15/21    Authorization Type [employer] and [employer]    PT Start Time 0917    PT Stop Time 0958    PT Time Calculation (min) 41 min             Past Medical History:  Diagnosis Date   Arthritis    Benign prostatic hyperplasia (BPH) with urinary urgency    nodular   COPD (chronic obstructive pulmonary disease) (HCC)    Depression    Diabetes mellitus without complication (HCC)    Essential hypertension    Fibromyalgia, primary    Hyperlipidemia    Hypertension    Obesity     No past surgical history on file.  There were no vitals filed for this visit.   Subjective Assessment - 03/10/21 0918     Subjective pt verb doing home stretches. spoke with caseworker and they said they will auth more visits as needed. strengthening in th epast in PT has really helped    Currently in Pain? Yes    Pain Score 9     Pain Location Back    Pain Orientation Lower                               [facility] Adult PT Treatment/Exercise - 03/10/21 0001       Exercises   Exercises Lumbar;Knee/Hip      Lumbar Exercises: Aerobic   UBE (Upper Arm Bike) L 2 2 min fwd/2 min backwards    Nustep L 5 6 min      Lumbar Exercises: Machines for Strengthening   Cybex Lumbar Extension black band 2 sets 10    Leg Press 30# 15x, 10 x    Other Lumbar Machine Exercise seated rows and lats 20# 2 sets 10      Lumbar Exercises: Standing   Heel Raises 20 reps;3 seconds   black bar     Lumbar Exercises: Supine   Ab Set 15 reps;3 seconds   with ball   Clam  15 reps   red tband   Bent Knee Raise 20 reps   red tband   Bridge Non-compliant;10 reps;3 seconds   difficult fot pt. plus KTC and obl   Bridge with Ball Squeeze 15 reps;Compliant   NO bridge just squeeze     Knee/Hip Exercises: Machines for Strengthening   Cybex Knee Extension 10# 2 sets 10    Cybex Knee Flexion 20# 2 sets 10      Manual Therapy   Manual Therapy Passive ROM    Passive ROM BIL LE and trunk                       PT Short Term Goals - 03/10/21 0952       PT SHORT TERM GOAL #1   Title Pt will be independent with initial HEP.    Status Achieved                 PT Long Term Goals - 02/23/21 1332       PT LONG TERM GOAL #1   Title Pt will be independent with advanced HEP.    Time 8    Period Weeks    Status New      PT LONG TERM GOAL #2   Title Pt will increase LLE strength to at least 4+/5 to allow him to more easily perform transfers and ambulation.    Time 8    Period Weeks    Status New      PT LONG TERM GOAL #3   Title Pt will increase 5 times sit to/from stant to 14 seconds or less to allow him improved functional mobility.    Time 8    Period Weeks    Status New      PT LONG TERM GOAL #4   Title Pt will have BERG of 50/56 to place him at a low risk of falling.    Time 8    Period Weeks    Status New                   Plan - 03/10/21 0953     Clinical Impression Statement STG met. Pt tolerated initial progression of ther ex well. Cued for speed and control of mvmt with core activation. Pt tight in HS and stressed importance of stretchig at home.    PT Treatment/Interventions ADLs/Self Care Home Management;Cryotherapy;Electrical Stimulation;Iontophoresis 4mg/ml Dexamethasone;Moist Heat;Traction;Gait training;Stair training;Functional mobility training;Therapeutic activities;Therapeutic exercise;Balance training;Neuromuscular re-education;Patient/family education;Manual techniques;Passive range of motion;Dry  needling;Taping;Spinal Manipulations;Joint Manipulations    PT Next Visit Plan assess and progress HEP, core stability, strengthening             Patient will benefit from skilled therapeutic intervention in order to improve the following deficits and impairments:  Difficulty walking, Increased muscle spasms, Obesity, Decreased activity tolerance, Pain, Decreased balance, Impaired flexibility, Decreased strength, Postural dysfunction  Visit Diagnosis: Muscle weakness (generalized)  Chronic midline low back pain, unspecified whether sciatica present  Difficulty in walking, not elsewhere classified  Hamstring tightness of both lower extremities     Problem List Patient Active Problem List   Diagnosis Date Noted   Left ventricular dysfunction 03/08/2021   Chronic pain syndrome 02/18/2021   HFrEF (heart failure with reduced ejection fraction) (HCC) 01/21/2021   Hypokalemia 12/07/2020   Bradycardia 12/07/2020   Cardiomegaly 12/07/2020   Community acquired pneumonia of right lung 12/07/2020   Chronic midline low back pain 07/27/2020   Psoriasis 07/27/2020   Diabetic neuropathy (HCC) 07/27/2020   OA (osteoarthritis) 05/02/2019   Depression, major, single episode, severe (HCC) 08/23/2018   Acute respiratory failure (HCC) 03/24/2017   Obesity hypoventilation syndrome (HCC) 03/14/2017   Right shoulder pain 11/29/2016   BPH (benign prostatic hyperplasia) 03/14/2016   Hypertension associated with diabetes (HCC) 03/14/2016   Fibromyalgia 03/14/2016   Hyperlipidemia associated with type 2 diabetes mellitus (HCC) 03/14/2016   Insomnia 03/14/2016   COPD (chronic obstructive pulmonary disease) (HCC) 11/26/2015   Severe obstructive sleep apnea 06/15/2015   BMI 45.0-49.9, adult (HCC) 05/26/2015   Type 2 diabetes, controlled, with peripheral neuropathy (HCC) 05/04/2015   Left hip pain 03/16/2015    PAYSEUR,ANGIE, PTA 03/10/2021, 9:57 AM  [facility] [address] [place], [place], [address] Phone: [phone]   Fax:  [phone]  Name: Jonathon Graham MRN: [id number] Date of Birth: 11/29/1955    

## 2021-03-10 NOTE — Telephone Encounter (Signed)
*  STAT* If patient is at the pharmacy, call can be transferred to refill team.   1. Which medications need to be refilled? (please list name of each medication and dose if known) metoprolol tartrate (LOPRESSOR) 25 MG tablet  2. Which pharmacy/location (including street and city if local pharmacy) is medication to be sent to? [facility] - [place], [place] - [address]  3. Do they need a 30 day or 90 day supply? [id number]   Pharmacy is saying there didn't get the prescription for the patient

## 2021-03-11 DIAGNOSIS — G4733 Obstructive sleep apnea (adult) (pediatric): Secondary | ICD-10-CM | POA: Diagnosis not present

## 2021-03-14 ENCOUNTER — Encounter (HOSPITAL_COMMUNITY): Payer: Self-pay

## 2021-03-15 ENCOUNTER — Encounter: Payer: Self-pay | Admitting: Physical Therapy

## 2021-03-15 ENCOUNTER — Ambulatory Visit: Payer: Medicare HMO | Admitting: Physical Therapy

## 2021-03-15 ENCOUNTER — Other Ambulatory Visit: Payer: Self-pay

## 2021-03-15 DIAGNOSIS — R262 Difficulty in walking, not elsewhere classified: Secondary | ICD-10-CM | POA: Diagnosis not present

## 2021-03-15 DIAGNOSIS — M545 Low back pain, unspecified: Secondary | ICD-10-CM | POA: Diagnosis not present

## 2021-03-15 DIAGNOSIS — M629 Disorder of muscle, unspecified: Secondary | ICD-10-CM

## 2021-03-15 DIAGNOSIS — M6281 Muscle weakness (generalized): Secondary | ICD-10-CM

## 2021-03-15 DIAGNOSIS — G8929 Other chronic pain: Secondary | ICD-10-CM | POA: Diagnosis not present

## 2021-03-15 DIAGNOSIS — R252 Cramp and spasm: Secondary | ICD-10-CM | POA: Diagnosis not present

## 2021-03-15 NOTE — Therapy (Signed)
[facility] [address] [place], [place], [address] Phone: [phone]   Fax:  [phone]  Physical Therapy Treatment  Patient Details  Name: Jonathon Graham MRN: [id number] Date of Birth: Mar 24, 1956 Referring Provider (PT): Dr Kerby Nora   Encounter Date: 03/15/2021   PT End of Session - 03/15/21 1006     Visit Number 3    Date for PT Re-Evaluation 04/15/21    Authorization Type [employer] and MCD    PT Start Time 0930    PT Stop Time 1010    PT Time Calculation (min) 40 min    Activity Tolerance Patient tolerated treatment well    Behavior During Therapy [facility] for tasks assessed/performed             Past Medical History:  Diagnosis Date   Arthritis    Benign prostatic hyperplasia (BPH) with urinary urgency    nodular   COPD (chronic obstructive pulmonary disease) (HCC)    Depression    Diabetes mellitus without complication (HCC)    Essential hypertension    Fibromyalgia, primary    Hyperlipidemia    Hypertension    Obesity     History reviewed. No pertinent surgical history.  There were no vitals filed for this visit.   Subjective Assessment - 03/15/21 0931     Subjective "Its been good" Pt reports that he has been doing his HEP and got a peloton    Pertinent History COPD, Fibromyalgia, OA    Currently in Pain? Yes    Pain Score 7     Pain Location Leg    Pain Orientation Right;Left                               [facility] Adult PT Treatment/Exercise - 03/15/21 0001       Lumbar Exercises: Aerobic   Recumbent Bike L3 x4 min    Nustep L 5 4 min      Lumbar Exercises: Machines for Strengthening   Other Lumbar Machine Exercise seated rows and lats 25# 2 sets 10      Knee/Hip Exercises: Machines for Strengthening   Cybex Knee Extension 10 2x10    Cybex Knee Flexion 35lb 2x10      Knee/Hip Exercises: Standing   Forward Step Up Both;1 set;5 reps;Hand Hold: 0;Step Height: 6"     Walking with Sports Cord 30lb side step x5 each      Knee/Hip Exercises: Seated   Sit to Sand 2 sets;10 reps;without UE support                       PT Short Term Goals - 03/10/21 0952       PT SHORT TERM GOAL #1   Title Pt will be independent with initial HEP.    Status Achieved               PT Long Term Goals - 03/15/21 1008       PT LONG TERM GOAL #3   Title Pt will increase 5 times sit to/from stant to 14 seconds or less to allow him improved functional mobility.    Status On-going                   Plan - 03/15/21 1007     Clinical Impression Statement Pt did well with a progression to functional strengthening interventions. Cue needed not to  drag LE with resisted side steps. Some Le weakness noted with step ups. increase resistance tolerated with machine level interventions. Pt does report fatigue post treatment.    Personal Factors and Comorbidities Age;Fitness    Examination-Activity Limitations Locomotion Level;Sleep;Stairs;Stand    Nurse, children's;Shop    Stability/Clinical Decision Making Evolving/Moderate complexity    Rehab Potential Good    PT Frequency 2x / week    PT Duration 8 weeks    PT Next Visit Plan core stability, strengthening             Patient will benefit from skilled therapeutic intervention in order to improve the following deficits and impairments:  Difficulty walking, Increased muscle spasms, Obesity, Decreased activity tolerance, Pain, Decreased balance, Impaired flexibility, Decreased strength, Postural dysfunction  Visit Diagnosis: Chronic midline low back pain, unspecified whether sciatica present  Hamstring tightness of both lower extremities  Muscle weakness (generalized)  Difficulty in walking, not elsewhere classified     Problem List Patient Active Problem List   Diagnosis Date Noted   Left ventricular dysfunction 03/08/2021   Chronic pain  syndrome 02/18/2021   HFrEF (heart failure with reduced ejection fraction) (HCC) 01/21/2021   Hypokalemia 12/07/2020   Bradycardia 12/07/2020   Cardiomegaly 12/07/2020   Community acquired pneumonia of right lung 12/07/2020   Chronic midline low back pain 07/27/2020   Psoriasis 07/27/2020   Diabetic neuropathy (HCC) 07/27/2020   OA (osteoarthritis) 05/02/2019   Depression, major, single episode, severe (HCC) 08/23/2018   Acute respiratory failure (HCC) 03/24/2017   Obesity hypoventilation syndrome (HCC) 03/14/2017   Right shoulder pain 11/29/2016   BPH (benign prostatic hyperplasia) 03/14/2016   Hypertension associated with diabetes (HCC) 03/14/2016   Fibromyalgia 03/14/2016   Hyperlipidemia associated with type 2 diabetes mellitus (HCC) 03/14/2016   Insomnia 03/14/2016   COPD (chronic obstructive pulmonary disease) (HCC) 11/26/2015   Severe obstructive sleep apnea 06/15/2015   BMI 45.0-49.9, adult (HCC) 05/26/2015   Type 2 diabetes, controlled, with peripheral neuropathy (HCC) 05/04/2015   Left hip pain 03/16/2015    Grayce Sessions, PTA 03/15/2021, 10:09 AM  [facility] [address] [place], [place], [address] Phone: [phone]   Fax:  [phone]  Name: Norah Fick MRN: [id number] Date of Birth: 01-26-1956

## 2021-03-17 ENCOUNTER — Ambulatory Visit: Payer: Medicare HMO | Admitting: Physical Therapy

## 2021-03-22 ENCOUNTER — Ambulatory Visit: Payer: Medicare HMO | Admitting: Physical Therapy

## 2021-03-22 DIAGNOSIS — I519 Heart disease, unspecified: Secondary | ICD-10-CM | POA: Diagnosis not present

## 2021-03-22 LAB — BASIC METABOLIC PANEL
BUN/Creatinine Ratio: 22 (ref 10–24)
BUN: 22 mg/dL (ref 8–27)
CO2: 27 mmol/L (ref 20–29)
Calcium: 9.8 mg/dL (ref 8.6–10.2)
Chloride: 97 mmol/L (ref 96–106)
Creatinine, Ser: 0.98 mg/dL (ref 0.76–1.27)
Glucose: 111 mg/dL — ABNORMAL HIGH (ref 70–99)
Potassium: 3.9 mmol/L (ref 3.5–5.2)
Sodium: 141 mmol/L (ref 134–144)
eGFR: 87 mL/min/{1.73_m2} (ref >59)

## 2021-03-24 ENCOUNTER — Encounter: Payer: Self-pay | Admitting: Physical Therapy

## 2021-03-24 ENCOUNTER — Other Ambulatory Visit: Payer: Self-pay

## 2021-03-24 ENCOUNTER — Ambulatory Visit: Payer: Medicare HMO | Admitting: Physical Therapy

## 2021-03-24 ENCOUNTER — Telehealth (HOSPITAL_COMMUNITY): Payer: Self-pay | Admitting: *Deleted

## 2021-03-24 DIAGNOSIS — R252 Cramp and spasm: Secondary | ICD-10-CM | POA: Diagnosis not present

## 2021-03-24 DIAGNOSIS — M629 Disorder of muscle, unspecified: Secondary | ICD-10-CM

## 2021-03-24 DIAGNOSIS — M545 Low back pain, unspecified: Secondary | ICD-10-CM

## 2021-03-24 DIAGNOSIS — R262 Difficulty in walking, not elsewhere classified: Secondary | ICD-10-CM

## 2021-03-24 DIAGNOSIS — M6281 Muscle weakness (generalized): Secondary | ICD-10-CM | POA: Diagnosis not present

## 2021-03-24 DIAGNOSIS — G8929 Other chronic pain: Secondary | ICD-10-CM

## 2021-03-24 NOTE — Telephone Encounter (Signed)
Reaching out to patient to offer assistance regarding upcoming cardiac imaging study; pt verbalizes understanding of appt date/time, parking situation and where to check in, pre-test NPO status and medications ordered, and verified current allergies; name and call back number provided for further questions should they arise  Larey Brick RN Navigator Cardiac Imaging [facility] [phone] office [phone] cell  Patient to take 25mg  metoprolol tartrate two hours prior cardiac CT scan.
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
## 2021-03-25 ENCOUNTER — Ambulatory Visit (HOSPITAL_COMMUNITY)
Admission: RE | Admit: 2021-03-25 | Discharge: 2021-03-25 | Disposition: A | Discharge status: Home / Self Care | Payer: Medicare HMO | Source: Ambulatory Visit | Attending: Cardiovascular Disease | Admitting: Cardiovascular Disease

## 2021-03-25 ENCOUNTER — Other Ambulatory Visit: Payer: Self-pay | Admitting: Cardiology

## 2021-03-25 DIAGNOSIS — I2510 Atherosclerotic heart disease of native coronary artery without angina pectoris: Secondary | ICD-10-CM | POA: Diagnosis not present

## 2021-03-25 DIAGNOSIS — I519 Heart disease, unspecified: Secondary | ICD-10-CM | POA: Insufficient documentation

## 2021-03-25 DIAGNOSIS — R079 Chest pain, unspecified: Secondary | ICD-10-CM | POA: Insufficient documentation

## 2021-03-25 MED ORDER — NITROGLYCERIN 0.4 MG SL SUBL
SUBLINGUAL_TABLET | SUBLINGUAL | Status: AC
  Filled 2021-03-25: qty 2

## 2021-03-25 MED ORDER — IOHEXOL 350 MG/ML SOLN
100.0000 mL | Freq: Once | INTRAVENOUS | Status: AC | PRN
  Administered 2021-03-25: 100 mL via INTRAVENOUS

## 2021-03-25 MED ORDER — METOPROLOL TARTRATE 5 MG/5ML IV SOLN: 5.0000 mg | Freq: Once | INTRAVENOUS | Status: AC

## 2021-03-25 MED ORDER — NITROGLYCERIN 0.4 MG SL SUBL
0.8000 mg | SUBLINGUAL_TABLET | Freq: Once | SUBLINGUAL | Status: AC
  Administered 2021-03-25: 0.8 mg via SUBLINGUAL

## 2021-03-25 MED ORDER — METOPROLOL TARTRATE 5 MG/5ML IV SOLN
INTRAVENOUS | Status: AC
  Administered 2021-03-25: 5 mg via INTRAVENOUS
  Filled 2021-03-25: qty 5

## 2021-03-27 DIAGNOSIS — J449 Chronic obstructive pulmonary disease, unspecified: Secondary | ICD-10-CM | POA: Diagnosis not present

## 2021-03-28 ENCOUNTER — Other Ambulatory Visit: Payer: Self-pay

## 2021-03-28 ENCOUNTER — Telehealth: Payer: Self-pay | Admitting: Family Medicine

## 2021-03-28 ENCOUNTER — Ambulatory Visit: Payer: Medicare HMO | Admitting: Physical Therapy

## 2021-03-28 ENCOUNTER — Encounter: Payer: Self-pay | Admitting: Physical Therapy

## 2021-03-28 ENCOUNTER — Other Ambulatory Visit: Payer: Self-pay | Admitting: Family Medicine

## 2021-03-28 DIAGNOSIS — R252 Cramp and spasm: Secondary | ICD-10-CM

## 2021-03-28 DIAGNOSIS — E119 Type 2 diabetes mellitus without complications: Secondary | ICD-10-CM

## 2021-03-28 DIAGNOSIS — R262 Difficulty in walking, not elsewhere classified: Secondary | ICD-10-CM | POA: Diagnosis not present

## 2021-03-28 DIAGNOSIS — G8929 Other chronic pain: Secondary | ICD-10-CM | POA: Diagnosis not present

## 2021-03-28 DIAGNOSIS — M6281 Muscle weakness (generalized): Secondary | ICD-10-CM | POA: Diagnosis not present

## 2021-03-28 DIAGNOSIS — M545 Low back pain, unspecified: Secondary | ICD-10-CM

## 2021-03-28 DIAGNOSIS — M629 Disorder of muscle, unspecified: Secondary | ICD-10-CM | POA: Diagnosis not present

## 2021-03-28 NOTE — Telephone Encounter (Signed)
Received refill request on Metformin today which I denied because Metformin was stopped by Dr. Ermalene Searing back in 12/2020.  Spoke with Jonathon Graham who states he is doing the Ozempic injection every Monday.  He finds that on just the Ozempic his blood sugars run between 120-130.  So he started retaking his Metformin 1000 mg one tablet daily and finds with that his blood sugars run between 95-100 mg.  He states he has been doing this now for about 4 weeks and wanted to let Dr. Ermalene Searing know.  Please advise if okay to continue so I can refill his Metformin.

## 2021-03-28 NOTE — Telephone Encounter (Signed)
Pt called requesting a call back has questions and concern about his medication . [phone]

## 2021-03-28 NOTE — Therapy (Signed)
[facility] [address] [place], [place], [address] Phone: [phone]   Fax:  [phone]  Physical Therapy Treatment  Patient Details  Name: Jonathon Graham MRN: [id number] Date of Birth: Apr 02, 1956 Referring Provider (PT): Dr Kerby Nora   Encounter Date: 03/28/2021   PT End of Session - 03/28/21 0915     Visit Number 5    Date for PT Re-Evaluation 04/15/21    Authorization Type [employer] and M[employer]    PT Start Time 0845    PT Stop Time 0915    PT Time Calculation (min) 30 min    Activity Tolerance Patient limited by fatigue    Behavior During Therapy [facility] for tasks assessed/performed             Past Medical History:  Diagnosis Date   Arthritis    Benign prostatic hyperplasia (BPH) with urinary urgency    nodular   COPD (chronic obstructive pulmonary disease) (HCC)    Depression    Diabetes mellitus without complication (HCC)    Essential hypertension    Fibromyalgia, primary    Hyperlipidemia    Hypertension    Obesity     History reviewed. No pertinent surgical history.  There were no vitals filed for this visit.   Subjective Assessment - 03/28/21 0846     Subjective Pt reports that he had a procedure performed Friday, medicine injected to remove plaque in his heart. Felt "lousy" over weekend    Pertinent History COPD, Fibromyalgia, OA    Currently in Pain? Yes    Pain Score 7     Pain Location Leg    Pain Orientation Left;Right                [facility] PT Assessment - 03/28/21 0001       Transfers   Five time sit to stand comments  13.8 Sec    Comments L UE on L knee assist.                           [facility] Adult PT Treatment/Exercise - 03/28/21 0001       Lumbar Exercises: Aerobic   UBE (Upper Arm Bike) L 2 2 min fwd/2 min backwards    Nustep L 5 4 min      Lumbar Exercises: Machines for Strengthening   Leg Press 40lb 2x10      Lumbar Exercises: Standing   Row  Strengthening;Both;20 reps    Row Limitations 10    Shoulder Extension Strengthening;Power Tower;Both;20 reps    Shoulder Extension Limitations 10                       PT Short Term Goals - 03/24/21 5361       PT SHORT TERM GOAL #1   Title Pt will be independent with initial HEP.    Status Achieved               PT Long Term Goals - 03/28/21 0904       PT LONG TERM GOAL #3   Title Pt will increase 5 times sit to/from stant to 14 seconds or less to allow him improved functional mobility.    Status Achieved                   Plan - 03/28/21 0916     Clinical Impression Statement Despite limited session due to increase fatigue,  pt did progress towards goal. BP taken in LUE 120/2mmhg.  Pt did decrease his 5x sit to stand time meeting goal. Core weakness present with standing shoulder extension, requiring tactile cue to maintain correct posture. Cue for full ROM needed on leg press. Shorten treatment time due to subjective reports of fatigue/    Personal Factors and Comorbidities Age;Fitness    Examination-Activity Limitations Locomotion Level;Sleep;Stairs;Stand    Nurse, children's;Shop    Stability/Clinical Decision Making Evolving/Moderate complexity    Rehab Potential Good    PT Frequency 2x / week    PT Treatment/Interventions ADLs/Self Care Home Management;Cryotherapy;Electrical Stimulation;Iontophoresis 4mg /ml Dexamethasone;Moist Heat;Traction;Gait training;Stair training;Functional mobility training;Therapeutic activities;Therapeutic exercise;Balance training;Neuromuscular re-education;Patient/family education;Manual techniques;Passive range of motion;Dry needling;Taping;Spinal Manipulations;Joint Manipulations    PT Next Visit Plan core stability, strengthening             Patient will benefit from skilled therapeutic intervention in order to improve the following deficits and impairments:  Difficulty  walking, Increased muscle spasms, Obesity, Decreased activity tolerance, Pain, Decreased balance, Impaired flexibility, Decreased strength, Postural dysfunction  Visit Diagnosis: Chronic midline low back pain, unspecified whether sciatica present  Muscle weakness (generalized)  Difficulty in walking, not elsewhere classified  Cramp and spasm     Problem List Patient Active Problem List   Diagnosis Date Noted   Left ventricular dysfunction 03/08/2021   Chronic pain syndrome 02/18/2021   HFrEF (heart failure with reduced ejection fraction) (HCC) 01/21/2021   Hypokalemia 12/07/2020   Bradycardia 12/07/2020   Cardiomegaly 12/07/2020   Community acquired pneumonia of right lung 12/07/2020   Chronic midline low back pain 07/27/2020   Psoriasis 07/27/2020   Diabetic neuropathy (HCC) 07/27/2020   OA (osteoarthritis) 05/02/2019   Depression, major, single episode, severe (HCC) 08/23/2018   Acute respiratory failure (HCC) 03/24/2017   Obesity hypoventilation syndrome (HCC) 03/14/2017   Right shoulder pain 11/29/2016   BPH (benign prostatic hyperplasia) 03/14/2016   Hypertension associated with diabetes (HCC) 03/14/2016   Fibromyalgia 03/14/2016   Hyperlipidemia associated with type 2 diabetes mellitus (HCC) 03/14/2016   Insomnia 03/14/2016   COPD (chronic obstructive pulmonary disease) (HCC) 11/26/2015   Severe obstructive sleep apnea 06/15/2015   BMI 45.0-49.9, adult (HCC) 05/26/2015   Type 2 diabetes, controlled, with peripheral neuropathy (HCC) 05/04/2015   Left hip pain 03/16/2015    03/18/2015, PTA 03/28/2021, 9:20 AM  [facility] [address] [place], [place], [place] Phone: [phone]   Fax:  [phone]  Name: Jonathon Graham MRN: Lora Paula Date of Birth: 1955/06/10

## 2021-03-29 MED ORDER — METFORMIN HCL 1000 MG PO TABS: 1000.0000 mg | ORAL_TABLET | Freq: Every day | ORAL | 1 refills | Status: DC

## 2021-03-29 NOTE — Telephone Encounter (Signed)
Refill sent to [facility] in [place] as instructed by Dr. Ermalene Searing.  Left message for Mr. Jonathon Graham that it is okay to continue taking the Metformin 1000 mg once daily and refill has been sent to his pharmacy.

## 2021-03-29 NOTE — Telephone Encounter (Signed)
Okay to refill Metfomrin.

## 2021-03-30 ENCOUNTER — Ambulatory Visit: Payer: Medicare HMO | Admitting: Physical Therapy

## 2021-03-31 ENCOUNTER — Ambulatory Visit (INDEPENDENT_AMBULATORY_CARE_PROVIDER_SITE_OTHER): Payer: Medicare HMO | Admitting: Pharmacist

## 2021-03-31 ENCOUNTER — Other Ambulatory Visit: Payer: Self-pay

## 2021-03-31 VITALS — BP 103/69 | HR 66 | Wt 288.8 lb

## 2021-03-31 DIAGNOSIS — E785 Hyperlipidemia, unspecified: Secondary | ICD-10-CM | POA: Diagnosis not present

## 2021-03-31 DIAGNOSIS — I5020 Unspecified systolic (congestive) heart failure: Secondary | ICD-10-CM | POA: Diagnosis not present

## 2021-03-31 DIAGNOSIS — E1169 Type 2 diabetes mellitus with other specified complication: Secondary | ICD-10-CM | POA: Diagnosis not present

## 2021-03-31 MED ORDER — ATORVASTATIN CALCIUM 40 MG PO TABS: 40.0000 mg | ORAL_TABLET | Freq: Every day | ORAL | 1 refills | Status: DC

## 2021-03-31 NOTE — Patient Instructions (Addendum)
It was nice meeting you today  We would like your blood pressure to stay less than 130/80  We would your LDL (bad cholesterol) to be less than 55  We will increase your atorvastatin to 40mg  once a day  Continue your healthy eating.  Continue to reduce carbohydrates and concentrate on vegetables and lean proteins  Please call with any questions!  , PharmD, BCACP, CDCES, CPP [address] [place], [place], [place] Phone: [phone], Fax: [phone]

## 2021-03-31 NOTE — Progress Notes (Signed)
Patient ID: Jonathon Graham                 DOB: 01-09-1956                    MRN: [id number]     HPI: Jonathon Graham is a 65 y.o. male patient referred to lipid clinic by Dr Gwenlyn Found. PMH is significant for T2DM, COPD, HLD, and HF. At last visit with Dr. Verdie Shire was added to regimen.  Patient presents today in good spirits.  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  Had work related accident in his past which injured his spine and legs making it difficult for him to exercise.  Blood pressure has been well controlled at recent visits.  Has been working on diet changes as well.  Reports fasting blood sugar readings in low 100s.  Has been steadily losing weight over last year.  Has cut out rice, potatoes, and sugary drinks.  Has had no swelling, chest pain, or SOB.    Home BP readings:  11/2: 131/70, 129/74 11/1: 111/78, 121/76 10/31: 120/73, 120/74 10/30: 126/70, 112/65  Had coronary CT on 10/28 which showed: Coronary calcium score of 128. This was 55 percentile for age and sex matched control.  Current HLD Medications: atorvastatin 83m (medication list states 232m  Current HTN Medications: HCTZ 12.13m69mwas not on patient's list), metoprolol tartrate 12.13mg72mD  LDL goal: <55 BP goal: <130/80  Labs: TC 104, Trigs 79, HDL 39.8, LDL 49 (on atorvastatin 10mg61m/24/22)  Past Medical History:  Diagnosis Date   Arthritis    Benign prostatic hyperplasia (BPH) with urinary urgency    nodular   COPD (chronic obstructive pulmonary disease) (HCC)    Depression    Diabetes mellitus without complication (HCC)    Essential hypertension    Fibromyalgia, primary    Hyperlipidemia    Hypertension    Obesity     Current Outpatient Medications on File Prior to Visit  Medication Sig Dispense Refill   albuterol (PROAIR HFA) 108 (90 Base) MCG/ACT inhaler Inhale 1-2 puffs into the lungs every 4 (four) hours as needed for wheezing or shortness of breath. 1 each 5    albuterol (PROVENTIL) (2.5 MG/3ML) 0.083% nebulizer solution Take 3 mLs (2.5 mg total) by nebulization every 6 (six) hours as needed for wheezing or shortness of breath. 360 mL 6   aspirin 81 MG tablet Take 243 mg by mouth daily. Takes 4 81mg 61mrin daily.     atorvastatin (LIPITOR) 20 MG tablet Take 1 tablet (20 mg total) by mouth daily. 90 tablet 3   betamethasone valerate ointment (VALISONE) 0.1 % Apply 1 application topically 2 (two) times daily. For up to 10 days. 30 g 0   blood glucose meter kit and supplies KIT Dispense based on patient and insurance preference. Use up to four times daily as directed. (FOR ICD-9 250.00, 250.01). 1 each 0   Cholecalciferol (VITAMIN D3) 1.25 MG (50000 UT) CAPS TAKE 1 CAPSULE BY MOUTH  ONCE A WEEK 12 capsule 0   clotrimazole (LOTRIMIN) 1 % cream Apply 1 application 2 (two) times daily topically. For 7 days 28 g 0   cyclobenzaprine (FLEXERIL) 10 MG tablet Take 1 tablet (10 mg total) by mouth at bedtime as needed for muscle spasms. 15 tablet 0   DULoxetine (CYMBALTA) 60 MG capsule Take 1 capsule (60 mg total) by mouth daily. 90 capsule 1   Elastic Bandages & Supports (MEDICAL COMPRESSION SOCKS)  MISC 1 each by Does not apply route daily. 1 each 0   fluticasone-salmeterol (ADVAIR) 250-50 MCG/ACT AEPB Inhale 1 puff into the lungs every 12 (twelve) hours. 60 each 11   furosemide (LASIX) 40 MG tablet Take 1 tablet (40 mg total) by mouth daily. 90 tablet 3   gabapentin (NEURONTIN) 300 MG capsule TAKE 1 CAPSULE BY MOUTH THREE TIMES DAILY 270 capsule 0   ibuprofen (ADVIL) 800 MG tablet Take 1 tablet (800 mg total) by mouth every 8 (eight) hours as needed. 60 tablet 0   metFORMIN (GLUCOPHAGE) 1000 MG tablet Take 1 tablet (1,000 mg total) by mouth daily. 90 tablet 1   metoprolol tartrate (LOPRESSOR) 25 MG tablet Take 0.5 tablets (12.5 mg total) by mouth 2 (two) times daily. 190 tablet 0   Misc. Devices (WRIST BRACE) MISC 2 Units by Does not apply route. Wear on wrists as  needed for support     ondansetron (ZOFRAN) 8 MG tablet Take 1 tablet (8 mg total) by mouth every 8 (eight) hours as needed for nausea or vomiting. 12 tablet 0   oxyCODONE-acetaminophen (PERCOCET/ROXICET) 5-325 MG tablet Take 0.5 tablets by mouth daily as needed for severe pain. Do not fill until 04/20/2021 15 tablet 0   potassium chloride SA (KLOR-CON) 20 MEQ tablet Take 1 tablet (20 mEq total) by mouth daily. 90 tablet 0   Semaglutide,0.25 or 0.5MG/DOS, (OZEMPIC, 0.25 OR 0.5 MG/DOSE,) 2 MG/1.5ML SOPN Inject 0.25 mg into the skin once a week. 6 mL 5   tamsulosin (FLOMAX) 0.4 MG CAPS capsule Take 1 capsule (0.4 mg total) by mouth daily. 90 capsule 3   No current facility-administered medications on file prior to visit.    Allergies  Allergen Reactions   Penicillins Anaphylaxis and Swelling    Assessment/Plan:  1. Hyperlipidemia - Patient last LDL 49 which is at goal of <55 on atorvastatin 13m.  However, recent coronary CT showed calcium score of 128 so patient needs more aggressive statin therapy.  Will increase to atorvastatin 463monce daily.  2. HF - Patient's BP in room today 103/69 which is at goal of <130/80 and similar to his home readings.  Pulse rate 66 so not much room to increase beta blocker.  Since patient feels well and is practicing lifestyle changes will not make any adjustments at this time.    ChKarren CobblePharmD, BCACP, CDStapletonCPLebanonSuHorse PasturerWest Sand [id number]: [phone]: [phone]

## 2021-04-07 ENCOUNTER — Encounter: Payer: Self-pay | Admitting: Family Medicine

## 2021-04-07 ENCOUNTER — Telehealth: Payer: Self-pay | Admitting: *Deleted

## 2021-04-07 ENCOUNTER — Other Ambulatory Visit: Payer: Self-pay

## 2021-04-07 ENCOUNTER — Ambulatory Visit (INDEPENDENT_AMBULATORY_CARE_PROVIDER_SITE_OTHER): Payer: Medicare HMO | Admitting: Family Medicine

## 2021-04-07 DIAGNOSIS — I152 Hypertension secondary to endocrine disorders: Secondary | ICD-10-CM | POA: Diagnosis not present

## 2021-04-07 DIAGNOSIS — E1149 Type 2 diabetes mellitus with other diabetic neurological complication: Secondary | ICD-10-CM | POA: Diagnosis not present

## 2021-04-07 DIAGNOSIS — E1169 Type 2 diabetes mellitus with other specified complication: Secondary | ICD-10-CM | POA: Diagnosis not present

## 2021-04-07 DIAGNOSIS — E1159 Type 2 diabetes mellitus with other circulatory complications: Secondary | ICD-10-CM | POA: Diagnosis not present

## 2021-04-07 DIAGNOSIS — E1142 Type 2 diabetes mellitus with diabetic polyneuropathy: Secondary | ICD-10-CM

## 2021-04-07 DIAGNOSIS — F322 Major depressive disorder, single episode, severe without psychotic features: Secondary | ICD-10-CM

## 2021-04-07 DIAGNOSIS — E785 Hyperlipidemia, unspecified: Secondary | ICD-10-CM | POA: Diagnosis not present

## 2021-04-07 MED ORDER — BLOOD PRESSURE MONITOR/L CUFF MISC: 0 refills | Status: DC

## 2021-04-07 MED ORDER — FREESTYLE LIBRE 3 SENSOR MISC: 1.0000 | 5 refills | Status: AC

## 2021-04-07 NOTE — Patient Instructions (Addendum)
Set up yearly eye exam for diabetes and have the opthalmologist send [place] a copy of the evaluation for the chart. Keep up great work on healthy low carb diet and restart exercise.

## 2021-04-07 NOTE — Progress Notes (Signed)
Patient ID: Jonathon Graham, male    DOB: February 11, 1956, 65 y.o.   MRN: [id number]  This visit was conducted in person.  BP 110/62   Pulse 96   Temp 97.8 F (36.6 C) (Temporal)   Ht _0  (1.702 m)   Wt 288 lb 8 oz (130.9 kg)   SpO2 96%   BMI 45.19 kg/m    CC: Chief Complaint  Patient presents with   Diabetes    Subjective:   HPI: Jonathon Graham is a 65 y.o. male presenting on 04/07/2021 for Diabetes  MDD,  SE to Cymbalta 60 mg daily,   now on 30 mg daily x 2 months. Has re-established care with Counselor.  Diabetes:  He has been on ozempic now for last  2 months... he has not been exercising.Marland Kitchen weight has stabilized. Wt Readings from Last 3 Encounters:  04/07/21 288 lb 8 oz (130.9 kg)  03/31/21 288 lb 12.8 oz (131 kg)  03/08/21 287 lb 9.6 oz (130.5 kg)  Using medications without difficulties: Hypoglycemic episodes:none Hyperglycemic episodes: none Feet problems: no ulcers Blood Sugars averaging: 98-110 eye exam within last year: due   Reviewed last cardiology OV notes.  Had coronary CT on 10/28 which showed: Coronary calcium score of 128. This was 58 percentile for age and sex matched control. 05/2021 has ECHO and follow up with Dr. Gwenlyn Found.  Atorvastatin has now been increased to 40 mg... not having any SE.     Relevant past medical, surgical, family and social history reviewed and updated as indicated. Interim medical history since our last visit reviewed. Allergies and medications reviewed and updated. Outpatient Medications Prior to Visit  Medication Sig Dispense Refill   albuterol (PROAIR HFA) 108 (90 Base) MCG/ACT inhaler Inhale 1-2 puffs into the lungs every 4 (four) hours as needed for wheezing or shortness of breath. 1 each 5   albuterol (PROVENTIL) (2.5 MG/3ML) 0.083% nebulizer solution Take 3 mLs (2.5 mg total) by nebulization every 6 (six) hours as needed for wheezing or shortness of breath. 360 mL 6   aspirin 81 MG tablet Take 243 mg by mouth daily. Takes 4  63m aspirin daily.     atorvastatin (LIPITOR) 40 MG tablet Take 1 tablet (40 mg total) by mouth daily. 90 tablet 1   betamethasone valerate ointment (VALISONE) 0.1 % Apply 1 application topically 2 (two) times daily. For up to 10 days. 30 g 0   blood glucose meter kit and supplies KIT Dispense based on patient and insurance preference. Use up to four times daily as directed. (FOR ICD-9 250.00, 250.01). 1 each 0   Cholecalciferol (VITAMIN D3) 1.25 MG (50000 UT) CAPS TAKE 1 CAPSULE BY MOUTH  ONCE A WEEK 12 capsule 0   clotrimazole (LOTRIMIN) 1 % cream Apply 1 application 2 (two) times daily topically. For 7 days 28 g 0   cyclobenzaprine (FLEXERIL) 10 MG tablet Take 1 tablet (10 mg total) by mouth at bedtime as needed for muscle spasms. 15 tablet 0   DULoxetine (CYMBALTA) 60 MG capsule Take 1 capsule (60 mg total) by mouth daily. 90 capsule 1   Elastic Bandages & Supports (MEDICAL COMPRESSION SOCKS) MISC 1 each by Does not apply route daily. 1 each 0   fluticasone-salmeterol (ADVAIR) 250-50 MCG/ACT AEPB Inhale 1 puff into the lungs every 12 (twelve) hours. 60 each 11   furosemide (LASIX) 40 MG tablet Take 1 tablet (40 mg total) by mouth daily. 90 tablet 3   gabapentin (NEURONTIN) 300 MG  capsule TAKE 1 CAPSULE BY MOUTH THREE TIMES DAILY 270 capsule 0   hydrochlorothiazide (MICROZIDE) 12.5 MG capsule Take 12.5 mg by mouth daily. Patient reported     ibuprofen (ADVIL) 800 MG tablet Take 1 tablet (800 mg total) by mouth every 8 (eight) hours as needed. 60 tablet 0   metFORMIN (GLUCOPHAGE) 1000 MG tablet Take 1 tablet (1,000 mg total) by mouth daily. 90 tablet 1   metoprolol tartrate (LOPRESSOR) 25 MG tablet Take 0.5 tablets (12.5 mg total) by mouth 2 (two) times daily. 190 tablet 0   Misc. Devices (WRIST BRACE) MISC 2 Units by Does not apply route. Wear on wrists as needed for support     ondansetron (ZOFRAN) 8 MG tablet Take 1 tablet (8 mg total) by mouth every 8 (eight) hours as needed for nausea or  vomiting. 12 tablet 0   oxyCODONE-acetaminophen (PERCOCET/ROXICET) 5-325 MG tablet Take 0.5 tablets by mouth daily as needed for severe pain. Do not fill until 04/20/2021 15 tablet 0   potassium chloride SA (KLOR-CON) 20 MEQ tablet Take 1 tablet (20 mEq total) by mouth daily. 90 tablet 0   Semaglutide,0.25 or 0.5MG/DOS, (OZEMPIC, 0.25 OR 0.5 MG/DOSE,) 2 MG/1.5ML SOPN Inject 0.25 mg into the skin once a week. 6 mL 5   tamsulosin (FLOMAX) 0.4 MG CAPS capsule Take 1 capsule (0.4 mg total) by mouth daily. 90 capsule 3   No facility-administered medications prior to visit.     Per HPI unless specifically indicated in ROS section below Review of Systems  Constitutional:  Negative for fatigue and fever.  HENT:  Negative for ear pain.   Eyes:  Negative for pain.  Respiratory:  Negative for cough and shortness of breath.   Cardiovascular:  Negative for chest pain, palpitations and leg swelling.  Gastrointestinal:  Negative for abdominal pain.  Genitourinary:  Negative for dysuria.  Musculoskeletal:  Negative for arthralgias.  Neurological:  Negative for syncope, light-headedness and headaches.  Psychiatric/Behavioral:  Negative for dysphoric mood.   Objective:  BP 110/62   Pulse 96   Temp 97.8 F (36.6 C) (Temporal)   Ht _0  (1.702 m)   Wt 288 lb 8 oz (130.9 kg)   SpO2 96%   BMI 45.19 kg/m   Wt Readings from Last 3 Encounters:  04/07/21 288 lb 8 oz (130.9 kg)  03/31/21 288 lb 12.8 oz (131 kg)  03/08/21 287 lb 9.6 oz (130.5 kg)      Physical Exam Constitutional:      Appearance: He is well-developed. He is obese.  HENT:     Head: Normocephalic.     Right Ear: Hearing normal.     Left Ear: Hearing normal.     Nose: Nose normal.  Neck:     Thyroid: No thyroid mass or thyromegaly.     Vascular: No carotid bruit.     Trachea: Trachea normal.  Cardiovascular:     Rate and Rhythm: Normal rate and regular rhythm.     Pulses: Normal pulses.     Heart sounds: Heart sounds not  distant. No murmur heard.   No friction rub. No gallop.     Comments: No peripheral edema Pulmonary:     Effort: Pulmonary effort is normal. No respiratory distress.     Breath sounds: Normal breath sounds.  Skin:    General: Skin is warm and dry.     Findings: No rash.  Psychiatric:        Speech: Speech normal.  Behavior: Behavior normal.        Thought Content: Thought content normal.      Results for orders placed or performed in visit on 49/17/91  Basic metabolic panel  Result Value Ref Range   Glucose 111 (H) 70 - 99 mg/dL   BUN 22 8 - 27 mg/dL   Creatinine, Ser 0.98 0.76 - 1.27 mg/dL   eGFR 87 >59 mL/min/1.73   BUN/Creatinine Ratio 22 10 - 24   Sodium 141 134 - 144 mmol/L   Potassium 3.9 3.5 - 5.2 mmol/L   Chloride 97 96 - 106 mmol/L   CO2 27 20 - 29 mmol/L   Calcium 9.8 8.6 - 10.2 mg/dL    This visit occurred during the SARS-CoV-2 public health emergency.  Safety protocols were in place, including screening questions prior to the visit, additional usage of staff PPE, and extensive cleaning of exam room while observing appropriate contact time as indicated for disinfecting solutions.   COVID 19 screen:  No recent travel or known exposure to COVID19 The patient denies respiratory symptoms of COVID 19 at this time. The importance of social distancing was discussed today.   Assessment and Plan Problem List Items Addressed This Visit     Depression, major, single episode, severe ([employer]) (Chronic)    Stable, chronic.  Continue current medication.  Cymbalta 30 mg daily.. SE to 60 mg daily      Diabetic neuropathy (HCC) (Chronic)    Stable control.  Due to DM.      Hyperlipidemia associated with type 2 diabetes mellitus ([place]) (Chronic)    Per cardiology now on atorvastatin 40 mg daily given Calcium CT score... re-eval in 3 months.      Hypertension associated with diabetes ([place]) (Chronic)    Stable, chronic.  Continue current medication.   HCTZ 12.5 mg  daily, lasix 40 mg daily prn      Type 2 diabetes, controlled, with peripheral neuropathy (HCC) (Chronic)    Improving control on Ozempic.  Weight loss has plateaued... he will start regular exercise.  continue current dose ozempic 0.25 mg weekly.          Eliezer Lofts, MD

## 2021-04-07 NOTE — Assessment & Plan Note (Signed)
Per cardiology now on atorvastatin 40 mg daily given Calcium CT score... re-eval in 3 months.

## 2021-04-07 NOTE — Telephone Encounter (Signed)
Received email requesting PA for [facility].  PA completed on CoverMyMeds and sent to [facility] for review.  Can take up to 72 hours for a decision.

## 2021-04-07 NOTE — Addendum Note (Signed)
Addended by: Kerby Nora E on: 04/07/2021 10:07 AM   Modules accepted: Orders

## 2021-04-07 NOTE — Assessment & Plan Note (Signed)
Stable, chronic.  Continue current medication.  Cymbalta 30 mg daily.. SE to 60 mg daily

## 2021-04-07 NOTE — Assessment & Plan Note (Signed)
Improving control on Ozempic.  Weight loss has plateaued... he will start regular exercise.  continue current dose ozempic 0.25 mg weekly.

## 2021-04-07 NOTE — Assessment & Plan Note (Signed)
Stable control. Due to DM. 

## 2021-04-07 NOTE — Assessment & Plan Note (Addendum)
Stable, chronic.  Continue current medication.   HCTZ 12.5 mg daily, lasix 40 mg daily prn

## 2021-04-07 NOTE — Addendum Note (Signed)
Addended by: Damita Lack on: 04/07/2021 12:09 PM   Modules accepted: Orders

## 2021-04-11 ENCOUNTER — Telehealth: Payer: Self-pay | Admitting: Family Medicine

## 2021-04-11 MED ORDER — OZEMPIC (0.25 OR 0.5 MG/DOSE) 2 MG/1.5ML ~~LOC~~ SOPN: 0.2500 mg | PEN_INJECTOR | SUBCUTANEOUS | 5 refills | Status: DC

## 2021-04-11 NOTE — Telephone Encounter (Signed)
Refills sent as [facility] to [facility] [address] [address] in [place].

## 2021-04-11 NOTE — Telephone Encounter (Signed)
  Encourage patient to contact the pharmacy for refills or they can request refills through [facility]  LAST APPOINTMENT DATE:  Please schedule appointment if longer than 1 year  NEXT APPOINTMENT DATE:07/08/21  MEDICATION:Semaglutide,0.25 or 0.5MG /DOS, (OZEMPIC, 0.25 OR 0.5 MG/DOSE,) 2 MG/1.5ML SOPN  Is the patient out of medication? yes  PHARMACY:[facility]  [address] [place]  Let patient know to contact pharmacy at the end of the day to make sure medication is ready.  Please notify patient to allow 48-72 hours to process  CLINICAL FILLS OUT ALL BELOW:   LAST REFILL:  QTY:  REFILL DATE:    OTHER COMMENTS:    Okay for refill?  Please advise

## 2021-04-12 NOTE — Telephone Encounter (Signed)
Pt states the pharmacy does not have medication

## 2021-04-14 ENCOUNTER — Ambulatory Visit: Payer: Medicare HMO | Attending: Family Medicine | Admitting: Physical Therapy

## 2021-04-14 ENCOUNTER — Other Ambulatory Visit: Payer: Self-pay

## 2021-04-14 ENCOUNTER — Encounter: Payer: Self-pay | Admitting: Physical Therapy

## 2021-04-14 DIAGNOSIS — R252 Cramp and spasm: Secondary | ICD-10-CM | POA: Diagnosis not present

## 2021-04-14 DIAGNOSIS — R262 Difficulty in walking, not elsewhere classified: Secondary | ICD-10-CM | POA: Diagnosis not present

## 2021-04-14 DIAGNOSIS — M545 Low back pain, unspecified: Secondary | ICD-10-CM | POA: Insufficient documentation

## 2021-04-14 DIAGNOSIS — G8929 Other chronic pain: Secondary | ICD-10-CM | POA: Insufficient documentation

## 2021-04-14 DIAGNOSIS — M6281 Muscle weakness (generalized): Secondary | ICD-10-CM | POA: Diagnosis not present

## 2021-04-14 DIAGNOSIS — M629 Disorder of muscle, unspecified: Secondary | ICD-10-CM | POA: Diagnosis not present

## 2021-04-14 NOTE — Therapy (Signed)
[facility] [address] [place], [place], [address] Phone: [phone]   Fax:  [phone]  Physical Therapy Treatment  Patient Details  Name: Jonathon Graham MRN: [id number] Date of Birth: July 30, 1955 Referring Provider (PT): Dr Kerby Nora   Encounter Date: 04/14/2021   PT End of Session - 04/14/21 1210     Visit Number 6    Number of Visits 16    Date for PT Re-Evaluation 04/15/21    PT Start Time 1143    PT Stop Time 1215    PT Time Calculation (min) 32 min    Activity Tolerance Patient limited by fatigue    Behavior During Therapy [facility] for tasks assessed/performed             Past Medical History:  Diagnosis Date   Arthritis    Benign prostatic hyperplasia (BPH) with urinary urgency    nodular   COPD (chronic obstructive pulmonary disease) (HCC)    Depression    Diabetes mellitus without complication (HCC)    Essential hypertension    Fibromyalgia, primary    Hyperlipidemia    Hypertension    Obesity     History reviewed. No pertinent surgical history.  There were no vitals filed for this visit.   Subjective Assessment - 04/14/21 1144     Subjective will receive stent in january. Been going to the cardiovascular MD.    Currently in Pain? Yes    Pain Score 6     Pain Location Back                               [facility] Adult PT Treatment/Exercise - 04/14/21 0001       Lumbar Exercises: Aerobic   UBE (Upper Arm Bike) L 2 2 min fwd/2 min backwards      Lumbar Exercises: Machines for Strengthening   Other Lumbar Machine Exercise seated rows and lats 35# 2 sets 10      Lumbar Exercises: Standing   Row Strengthening;Both;20 reps    Row Limitations 10    Shoulder Extension Strengthening;Power Tower;Both;20 reps    Shoulder Extension Limitations 10      Knee/Hip Exercises: Seated   Sit to Sand 2 sets;10 reps;without UE support                       PT Short Term  Goals - 03/24/21 0076       PT SHORT TERM GOAL #1   Title Pt will be independent with initial HEP.    Status Achieved               PT Long Term Goals - 04/14/21 1211       PT LONG TERM GOAL #1   Title Pt will be independent with advanced HEP.    Status Achieved      PT LONG TERM GOAL #2   Title Pt will increase LLE strength to at least 4+/5 to allow him to more easily perform transfers and ambulation.    Status On-going      PT LONG TERM GOAL #3   Title Pt will increase 5 times sit to/from stant to 14 seconds or less to allow him improved functional mobility.    Status Achieved      PT LONG TERM GOAL #4   Title Pt will have BERG of 50/56 to place him at a low risk  of falling.    Status On-going                   Plan - 04/14/21 1215     Clinical Impression Statement Pt has decrease activity tolerance with activity, reports having COPD. O2 Sat stayed above 90% throughout session. Shorten session due at patients request of being too tired. Postural weakness noted with shoulder extensions requiring cues. One rest break needed with UBE aerobic warm up due to fatigue. Cue to prevent trunk leaning needed with seated rows.    Personal Factors and Comorbidities Age;Fitness    Examination-Activity Limitations Locomotion Level;Sleep;Stairs;Stand    Nurse, children's;Shop    Stability/Clinical Decision Making Evolving/Moderate complexity    Rehab Potential Good    PT Frequency 2x / week    PT Duration 8 weeks    PT Treatment/Interventions ADLs/Self Care Home Management;Cryotherapy;Electrical Stimulation;Iontophoresis 4mg /ml Dexamethasone;Moist Heat;Traction;Gait training;Stair training;Functional mobility training;Therapeutic activities;Therapeutic exercise;Balance training;Neuromuscular re-education;Patient/family education;Manual techniques;Passive range of motion;Dry needling;Taping;Spinal Manipulations;Joint Manipulations    PT  Next Visit Plan core stability, strengthening             Patient will benefit from skilled therapeutic intervention in order to improve the following deficits and impairments:  Difficulty walking, Increased muscle spasms, Obesity, Decreased activity tolerance, Pain, Decreased balance, Impaired flexibility, Decreased strength, Postural dysfunction  Visit Diagnosis: Chronic midline low back pain, unspecified whether sciatica present  Cramp and spasm  Difficulty in walking, not elsewhere classified  Muscle weakness (generalized)     Problem List Patient Active Problem List   Diagnosis Date Noted   Left ventricular dysfunction 03/08/2021   Chronic pain syndrome 02/18/2021   HFrEF (heart failure with reduced ejection fraction) (HCC) 01/21/2021   Hypokalemia 12/07/2020   Bradycardia 12/07/2020   Cardiomegaly 12/07/2020   Community acquired pneumonia of right lung 12/07/2020   Chronic midline low back pain 07/27/2020   Psoriasis 07/27/2020   Diabetic neuropathy (HCC) 07/27/2020   OA (osteoarthritis) 05/02/2019   Depression, major, single episode, severe (HCC) 08/23/2018   Acute respiratory failure (HCC) 03/24/2017   Obesity hypoventilation syndrome (HCC) 03/14/2017   Right shoulder pain 11/29/2016   BPH (benign prostatic hyperplasia) 03/14/2016   Hypertension associated with diabetes (HCC) 03/14/2016   Fibromyalgia 03/14/2016   Hyperlipidemia associated with type 2 diabetes mellitus (HCC) 03/14/2016   Insomnia 03/14/2016   COPD (chronic obstructive pulmonary disease) (HCC) 11/26/2015   Severe obstructive sleep apnea 06/15/2015   BMI 45.0-49.9, adult (HCC) 05/26/2015   Type 2 diabetes, controlled, with peripheral neuropathy (HCC) 05/04/2015   Left hip pain 03/16/2015    03/18/2015, PTA 04/14/2021, 12:19 PM  [facility] [address] [place], [place], [place] Phone: [phone]   Fax:  [phone]  Name:  Tarek Cravens MRN: Lora [id number] Date of Birth: 1955-06-09

## 2021-04-19 ENCOUNTER — Telehealth: Payer: Self-pay | Admitting: Family Medicine

## 2021-04-19 ENCOUNTER — Other Ambulatory Visit: Payer: Self-pay | Admitting: Family Medicine

## 2021-04-19 DIAGNOSIS — M48061 Spinal stenosis, lumbar region without neurogenic claudication: Secondary | ICD-10-CM

## 2021-04-19 DIAGNOSIS — M25552 Pain in left hip: Secondary | ICD-10-CM

## 2021-04-19 MED ORDER — TRULICITY 0.75 MG/0.5ML ~~LOC~~ SOAJ: 0.7500 mg | SUBCUTANEOUS | 11 refills | Status: DC

## 2021-04-19 NOTE — Progress Notes (Signed)
Rx for Trulicity sent in.Marland Kitchen dosing and administration is different, so he should request pharmacist to instruct on use of pen.

## 2021-04-19 NOTE — Telephone Encounter (Signed)
PT wants to make Dr. Ermalene Searing  aware that he has not been able to fill his RX Ozempic has been unavailable

## 2021-04-19 NOTE — Telephone Encounter (Signed)
  Encourage patient to contact the pharmacy for refills or they can request refills through [facility]  LAST APPOINTMENT DATE:  Please schedule appointment if longer than 1 year  NEXT APPOINTMENT DATE:  MEDICATION:oxyCODONE-acetaminophen (PERCOCET/ROXICET) 5-325 MG tablet   Is the patient out of medication?   [id number] [place] [address]    Let patient know to contact pharmacy at the end of the day to make sure medication is ready.  Please notify patient to allow 48-72 hours to process  CLINICAL FILLS OUT ALL BELOW:   LAST REFILL:  QTY:  REFILL DATE:    OTHER COMMENTS:    Okay for refill?  Please advise     

## 2021-04-19 NOTE — Telephone Encounter (Signed)
Spoke with Jonathon Graham.  He should have an available refill at [facility] on [address] for his Percocet that can be filled on 04/20/2021.  Patient states understanding.

## 2021-04-19 NOTE — Telephone Encounter (Signed)
Spoke with Jonathon Graham.  He would like to try to Trulicity in place of the Ozempic.  [facility] [place]

## 2021-04-19 NOTE — Telephone Encounter (Signed)
Call We can change to trulicity if this is available in place of ozempic... let me know

## 2021-04-19 NOTE — Telephone Encounter (Signed)
Left message for Mr. Sciarra to return my call.

## 2021-04-19 NOTE — Progress Notes (Signed)
Left message for Mr. Byard with below message from Dr. Ermalene Searing.  I ask that he call me back if he has any questions.

## 2021-04-25 ENCOUNTER — Telehealth: Payer: Self-pay | Admitting: Family Medicine

## 2021-04-25 NOTE — Telephone Encounter (Signed)
  Encourage patient to contact the pharmacy for refills or they can request refills through [facility]  LAST APPOINTMENT DATE:  Please schedule appointment if longer than 1 year  NEXT APPOINTMENT DATE:  MEDICATION:oxyCODONE-acetaminophen (PERCOCET/ROXICET) 5-325 MG tablet   Is the patient out of medication?   PHARMACY:[id number] - [place] [address] [place], [place] - [address]    Let patient know to contact pharmacy at the end of the day to make sure medication is ready.  Please notify patient to allow 48-72 hours to process  CLINICAL FILLS OUT ALL BELOW:   LAST REFILL:  QTY:  REFILL DATE:    OTHER COMMENTS:    Okay for refill?  Please advise

## 2021-04-25 NOTE — Telephone Encounter (Signed)
See previous phone note.  This has been taken care of.

## 2021-04-25 NOTE — Telephone Encounter (Signed)
Spoke with Mr. Parkerson and advised that he should have a refill on file at [facility] that could have been filled on 04/20/2021.  I called [facility] to verify and they do have refill on file and will get it ready for him.  Mr. Hoobler notified of this via telephone.

## 2021-04-25 NOTE — Telephone Encounter (Signed)
PLEASE NOTE: All timestamps contained within this report are represented as [place] Standard Time. CONFIDENTIALTY NOTICE: This fax transmission is intended only for the addressee. It contains information that is legally privileged, confidential or otherwise protected from use or disclosure. If you are not the intended recipient, you are strictly prohibited from reviewing, disclosing, copying using or disseminating any of this information or taking any action in reliance on or regarding this information. If you have received this fax in error, please notify us immediately by telephone so that we can arrange for its return to [place]. Phone: [phone], Toll-Free: [phone], Fax: [phone] Page: 1 of 2 Call Id: [id number] [facility] Night - Client TELEPHONE ADVICE RECORD AccessNurse Patient Name: Jonathon Graham RTS Gender: Male DOB: 29-Jun-1955 Age: 65 Y 11 M 8 D Return Phone Number: [phone] (Primary), [phone] (Secondary) Address: City/ State/ Zip: [place]  [address] Client [facility] Night - Client Client Site  [facility] - Night Provider Kerby Nora - MD Contact Type Call Who Is Calling Patient / Member / Family / Caregiver Call Type Triage / Clinical Relationship To Patient Self Return Phone Number [phone] (Primary) Chief Complaint Pain - Generalized Reason for Call Symptomatic / Request for Health Information Initial Comment Caller states a RX was sent to the pharmacy on Wednesday, pharmacy informed the patient that the RX isn't there. Medication was prescribe for pain Translation No Nurse Assessment Nurse: Vito Backers, RN, Ebone Date/Time (Eastern Time): 04/22/2021 11:30:13 AM Confirm and document reason for call. If symptomatic, describe symptoms. ---Caller states a RX was sent to the pharmacy on Wednesday, pharmacy informed the patient that the RX isn't there. Medication was  prescribe for pain. He received Trulicity, but not Ozempic. Back and leg pain Does the patient have any new or worsening symptoms? ---Yes Will a triage be completed? ---Yes Related visit to physician within the last 2 weeks? ---Yes Does the PT have any chronic conditions? (i.e. diabetes, asthma, this includes High risk factors for pregnancy, etc.) ---Yes List chronic conditions. ---Diabetes Is this a behavioral health or substance abuse call? ---No Guidelines Guideline Title Affirmed Question Affirmed Notes Nurse Date/Time (Eastern Time) Back Pain [1] SEVERE abdominal pain AND [2] present > 1 hour Walker-Foster, RN, Ebone 04/22/2021 11:31:29 AM Disp. Time Lamount Cohen Time) Disposition Final User 04/22/2021 11:34:06 AM Go to ED Now (or PCP triage) Yes Walker-Foster, RN, Ebone PLEASE NOTE: All timestamps contained within this report are represented as [place] Standard Time. CONFIDENTIALTY NOTICE: This fax transmission is intended only for the addressee. It contains information that is legally privileged, confidential or otherwise protected from use or disclosure. If you are not the intended recipient, you are strictly prohibited from reviewing, disclosing, copying using or disseminating any of this information or taking any action in reliance on or regarding this information. If you have received this fax in error, please notify us immediately by telephone so that we can arrange for its return to [place]. Phone: [phone], Toll-Free: [phone], Fax: [phone] Page: 2 of 2 Call Id: [id number] Caller Disagree/Comply Comply Caller Understands Yes PreDisposition Call Doctor Care Advice Given Per Guideline GO TO ED NOW (OR PCP TRIAGE): BRING MEDICINES: * Bring a list of your current medicines when you go to the Emergency Department (ER). CARE ADVICE given per Back Pain (Adult) guideline. Comments User: Ruta Hinds, RN Date/Time Lamount Cohen Time): 04/22/2021 11:32:47 AM Was  supposed to have back surgery and has not Referrals GO TO FACILITY UNDECIDED

## 2021-04-26 ENCOUNTER — Other Ambulatory Visit: Payer: Self-pay

## 2021-04-26 ENCOUNTER — Ambulatory Visit: Payer: Medicare HMO | Admitting: Physical Therapy

## 2021-04-26 ENCOUNTER — Encounter: Payer: Self-pay | Admitting: Physical Therapy

## 2021-04-26 DIAGNOSIS — M6281 Muscle weakness (generalized): Secondary | ICD-10-CM | POA: Diagnosis not present

## 2021-04-26 DIAGNOSIS — R252 Cramp and spasm: Secondary | ICD-10-CM | POA: Diagnosis not present

## 2021-04-26 DIAGNOSIS — M629 Disorder of muscle, unspecified: Secondary | ICD-10-CM

## 2021-04-26 DIAGNOSIS — G8929 Other chronic pain: Secondary | ICD-10-CM | POA: Diagnosis not present

## 2021-04-26 DIAGNOSIS — M545 Low back pain, unspecified: Secondary | ICD-10-CM | POA: Diagnosis not present

## 2021-04-26 DIAGNOSIS — R262 Difficulty in walking, not elsewhere classified: Secondary | ICD-10-CM

## 2021-04-26 NOTE — Therapy (Signed)
[facility] [address] [place], [place], [address] Phone: [phone]   Fax:  [phone]  Physical Therapy Treatment  Patient Details  Name: Jonathon Graham MRN: [id number] Date of Birth: Jun 04, 1955 Referring Provider (PT): Dr Kerby Nora   Encounter Date: 04/26/2021   PT End of Session - 04/26/21 0912     Visit Number 7    Number of Visits 16    Date for PT Re-Evaluation 05/24/21    PT Start Time 0844    PT Stop Time 0910    PT Time Calculation (min) 26 min    Activity Tolerance Patient tolerated treatment well    Behavior During Therapy [facility] for tasks assessed/performed             Past Medical History:  Diagnosis Date   Arthritis    Benign prostatic hyperplasia (BPH) with urinary urgency    nodular   COPD (chronic obstructive pulmonary disease) (HCC)    Depression    Diabetes mellitus without complication (HCC)    Essential hypertension    Fibromyalgia, primary    Hyperlipidemia    Hypertension    Obesity     History reviewed. No pertinent surgical history.  There were no vitals filed for this visit.   Subjective Assessment - 04/26/21 0846     Subjective Patient reports that he had a procedure done which may prevent him from having to get the stent. He has an echo scheduled for Jan 3 and will know if the previous procedure was successful. He is watching what he eats and has lost 100 pounds since his heaviest weight in 2019. he uses a [place] bike and treadmill he uses. He is also performing his HEP.    Pertinent History COPD, Fibromyalgia, OA    Limitations Walking;Standing    How long can you stand comfortably? about 10-15 min    How long can you walk comfortably? 100 ft    Patient Stated Goals To improve my strength of my  muscles and decrease my pain.    Currently in Pain? Yes    Pain Score 6     Pain Location Back    Pain Orientation Lower    Pain Descriptors / Indicators Aching    Pain Type  Chronic pain    Pain Onset More than a month ago    Pain Frequency Constant    Aggravating Factors  lifting heavy items, long standing or walking.    Pain Relieving Factors medication                               [facility] Adult PT Treatment/Exercise - 04/26/21 0001       Lumbar Exercises: Aerobic   UBE (Upper Arm Bike) L 1.5 3 min for/3 min back      Lumbar Exercises: Supine   Pelvic Tilt 10 reps;5 seconds    Bent Knee Raise 10 reps    Bent Knee Raise Limitations Supine marching with PPT    Bridge with Ball Squeeze 10 reps;1 second    Bridge with clamshell Non-compliant;10 reps;1 second    Bridge with [employer] G Tband                     PT Education - 04/26/21 0911     Education Details Updated HEP. Quality over quantity and the importance of holding good form while perfoming exercises.  Person(s) Educated Patient    Methods Explanation;Demonstration;Handout    Comprehension Returned demonstration;Verbalized understanding              PT Short Term Goals - 03/24/21 0922       PT SHORT TERM GOAL #1   Title Pt will be independent with initial HEP.    Status Achieved               PT Long Term Goals - 04/26/21 0919       PT LONG TERM GOAL #1   Period Weeks      PT LONG TERM GOAL #2   Title Pt will increase LLE strength to at least 4+/5 to allow him to more easily perform transfers and ambulation.    Baseline 4/5    Time 4    Period Weeks    Status On-going      PT LONG TERM GOAL #3   Title Pt will increase 5 times sit to/from stant to 14 seconds or less to allow him improved functional mobility.    Time 4    Period Weeks    Status Achieved      PT LONG TERM GOAL #4   Title Pt will have BERG of 50/56 to place him at a low risk of falling.    Time 4    Period Weeks    Status On-going    Target Date 05/24/21      PT LONG TERM GOAL #5   Title Patient will tolerate 15 minutes of standing  activities/exercises/gait with C/O back pain <6/10 (his baseline)    Baseline Reports pain with standing or gait.    Time 4    Period Weeks    Status New    Target Date 05/24/21                   Plan - 04/26/21 0914     Clinical Impression Statement Patient had to leave early due to pulmonologist appointment.    Personal Factors and Comorbidities Age;Fitness    Examination-Activity Limitations Locomotion Level;Sleep;Stairs;Stand    Nurse, children's;Shop    Stability/Clinical Decision Making Evolving/Moderate complexity    Clinical Decision Making Moderate    Rehab Potential Good    PT Frequency 2x / week    PT Duration 8 weeks    PT Treatment/Interventions ADLs/Self Care Home Management;Cryotherapy;Electrical Stimulation;Iontophoresis 4mg /ml Dexamethasone;Moist Heat;Traction;Gait training;Stair training;Functional mobility training;Therapeutic activities;Therapeutic exercise;Balance training;Neuromuscular re-education;Patient/family education;Manual techniques;Passive range of motion;Dry needling;Taping;Spinal Manipulations;Joint Manipulations    PT Next Visit Plan Continue to update HEp with additional core stability and strengthening exercises. Look at quadruped or standing.    PT Home Exercise Plan Lifescape    Consulted and Agree with Plan of Care Patient             Patient will benefit from skilled therapeutic intervention in order to improve the following deficits and impairments:  Difficulty walking, Increased muscle spasms, Obesity, Decreased activity tolerance, Pain, Decreased balance, Impaired flexibility, Decreased strength, Postural dysfunction  Visit Diagnosis: Chronic midline low back pain, unspecified whether sciatica present  Cramp and spasm  Difficulty in walking, not elsewhere classified  Hamstring tightness of both lower extremities  Muscle weakness (generalized)     Problem List Patient Active  Problem List   Diagnosis Date Noted   Left ventricular dysfunction 03/08/2021   Chronic pain syndrome 02/18/2021   HFrEF (heart failure with reduced ejection fraction) (HCC) 01/21/2021   Hypokalemia 12/07/2020   Bradycardia 12/07/2020  Cardiomegaly 12/07/2020   Community acquired pneumonia of right lung 12/07/2020   Chronic midline low back pain 07/27/2020   Psoriasis 07/27/2020   Diabetic neuropathy (HCC) 07/27/2020   OA (osteoarthritis) 05/02/2019   Depression, major, single episode, severe (HCC) 08/23/2018   Acute respiratory failure (HCC) 03/24/2017   Obesity hypoventilation syndrome (HCC) 03/14/2017   Right shoulder pain 11/29/2016   BPH (benign prostatic hyperplasia) 03/14/2016   Hypertension associated with diabetes (HCC) 03/14/2016   Fibromyalgia 03/14/2016   Hyperlipidemia associated with type 2 diabetes mellitus (HCC) 03/14/2016   Insomnia 03/14/2016   COPD (chronic obstructive pulmonary disease) (HCC) 11/26/2015   Severe obstructive sleep apnea 06/15/2015   BMI 45.0-49.9, adult (HCC) 05/26/2015   Type 2 diabetes, controlled, with peripheral neuropathy (HCC) 05/04/2015   Left hip pain 03/16/2015    Iona Beard, DPT 04/26/2021, 9:24 AM  [facility] [address] [place], [place], [address] Phone: [phone]   Fax:  [phone]  Name: Jonathon Graham MRN: [id number] Date of Birth: 03/28/1956

## 2021-04-26 NOTE — Patient Instructions (Signed)
Access Code: [facility] URL: [email or web address] Date: 04/26/2021 Prepared by: Oley Balm  Exercises Supine Bridge with [employer] Between Knees - 1 x daily - 7 x weekly - 2 sets - 10 reps Supine Bridge with Resistance Band - 1 x daily - 7 x weekly - 2 sets - 10 reps Supine Pelvic Tilt - 1 x daily - 7 x weekly - 1 sets - 10 reps - 5 hold Supine March with Posterior Pelvic Tilt - 1 x daily - 7 x weekly - 2 sets - 10 reps

## 2021-04-27 DIAGNOSIS — J449 Chronic obstructive pulmonary disease, unspecified: Secondary | ICD-10-CM | POA: Diagnosis not present

## 2021-04-29 ENCOUNTER — Ambulatory Visit: Payer: Medicare HMO

## 2021-05-05 ENCOUNTER — Ambulatory Visit: Payer: Medicare HMO | Admitting: Physical Therapy

## 2021-05-10 ENCOUNTER — Other Ambulatory Visit: Payer: Self-pay

## 2021-05-10 ENCOUNTER — Encounter: Payer: Self-pay | Admitting: Physical Therapy

## 2021-05-10 ENCOUNTER — Ambulatory Visit: Payer: Medicare HMO | Attending: Family Medicine | Admitting: Physical Therapy

## 2021-05-10 DIAGNOSIS — G8929 Other chronic pain: Secondary | ICD-10-CM | POA: Diagnosis not present

## 2021-05-10 DIAGNOSIS — R252 Cramp and spasm: Secondary | ICD-10-CM | POA: Diagnosis not present

## 2021-05-10 DIAGNOSIS — R262 Difficulty in walking, not elsewhere classified: Secondary | ICD-10-CM | POA: Insufficient documentation

## 2021-05-10 DIAGNOSIS — M629 Disorder of muscle, unspecified: Secondary | ICD-10-CM | POA: Insufficient documentation

## 2021-05-10 DIAGNOSIS — M545 Low back pain, unspecified: Secondary | ICD-10-CM | POA: Diagnosis not present

## 2021-05-10 DIAGNOSIS — M6281 Muscle weakness (generalized): Secondary | ICD-10-CM | POA: Diagnosis not present

## 2021-05-10 NOTE — Therapy (Signed)
[facility] [address] [place], [place], [address] Phone: [phone]   Fax:  [phone]  Physical Therapy Treatment  Patient Details  Name: Jonathon Graham MRN: [id number] Date of Birth: 1955/12/30 Referring Provider (PT): Dr Kerby Nora   Encounter Date: 05/10/2021   PT End of Session - 05/10/21 1051     Visit Number 8    Number of Visits 16    Date for PT Re-Evaluation 05/24/21    Authorization Type [employer] and [employer]    PT Start Time 1011    PT Stop Time 1051    PT Time Calculation (min) 40 min    Activity Tolerance Patient tolerated treatment well;Patient limited by fatigue             Past Medical History:  Diagnosis Date   Arthritis    Benign prostatic hyperplasia (BPH) with urinary urgency    nodular   COPD (chronic obstructive pulmonary disease) (HCC)    Depression    Diabetes mellitus without complication (HCC)    Essential hypertension    Fibromyalgia, primary    Hyperlipidemia    Hypertension    Obesity     History reviewed. No pertinent surgical history.  There were no vitals filed for this visit.   Subjective Assessment - 05/10/21 1015     Subjective Hurt his back last week, just lifting things    Pertinent History COPD, Fibromyalgia, OA    Pain Score 8     Pain Location Back                               [facility] Adult PT Treatment/Exercise - 05/10/21 0001       Lumbar Exercises: Aerobic   Nustep L 5 6 min      Lumbar Exercises: Supine   Bridge Compliant;2 seconds;20 reps    Bridge with [employer] 10 reps;1 second    Straight Leg Raise 20 reps;2 seconds      Knee/Hip Exercises: Machines for Strengthening   Cybex Knee Extension 10 2x10    Cybex Knee Flexion 35lb 2x10      Knee/Hip Exercises: Seated   Sit to Sand 2 sets;10 reps;without UE support                       PT Short Term Goals - 03/24/21 7106       PT SHORT TERM GOAL #1   Title  Pt will be independent with initial HEP.    Status Achieved               PT Long Term Goals - 04/26/21 0919       PT LONG TERM GOAL #1   Period Weeks      PT LONG TERM GOAL #2   Title Pt will increase LLE strength to at least 4+/5 to allow him to more easily perform transfers and ambulation.    Baseline 4/5    Time 4    Period Weeks    Status On-going      PT LONG TERM GOAL #3   Title Pt will increase 5 times sit to/from stant to 14 seconds or less to allow him improved functional mobility.    Time 4    Period Weeks    Status Achieved      PT LONG TERM GOAL #4   Title Pt will have BERG of 50/56  to place him at a low risk of falling.    Time 4    Period Weeks    Status On-going    Target Date 05/24/21      PT LONG TERM GOAL #5   Title Patient will tolerate 15 minutes of standing activities/exercises/gait with C/O back pain <6/10 (his baseline)    Baseline Reports pain with standing or gait.    Time 4    Period Weeks    Status New    Target Date 05/24/21                   Plan - 05/10/21 1052     Clinical Impression Statement Pt has a decrease activity tolerance with activity requiring multiple rest breaks. Tactile cue for core engagement needed with standing shoulder extensions. Narrow base of support noted with sit to stands. Supine bridges were taxing on pt causing some shortness of breath. Cue for full TKE needed with extensions    Personal Factors and Comorbidities Age;Fitness    Examination-Activity Limitations Locomotion Level;Sleep;Stairs;Stand    Nurse, children's;Shop    Stability/Clinical Decision Making Evolving/Moderate complexity    Rehab Potential Good    PT Frequency 2x / week    PT Duration 8 weeks    PT Treatment/Interventions ADLs/Self Care Home Management;Cryotherapy;Electrical Stimulation;Iontophoresis 4mg /ml Dexamethasone;Moist Heat;Traction;Gait training;Stair training;Functional mobility  training;Therapeutic activities;Therapeutic exercise;Balance training;Neuromuscular re-education;Patient/family education;Manual techniques;Passive range of motion;Dry needling;Taping;Spinal Manipulations;Joint Manipulations    PT Next Visit Plan Continue to update HEp with additional core stability and strengthening exercises. Look at quadruped or standing.             Patient will benefit from skilled therapeutic intervention in order to improve the following deficits and impairments:  Difficulty walking, Increased muscle spasms, Obesity, Decreased activity tolerance, Pain, Decreased balance, Impaired flexibility, Decreased strength, Postural dysfunction  Visit Diagnosis: Chronic midline low back pain, unspecified whether sciatica present  Difficulty in walking, not elsewhere classified  Hamstring tightness of both lower extremities  Cramp and spasm  Muscle weakness (generalized)     Problem List Patient Active Problem List   Diagnosis Date Noted   Left ventricular dysfunction 03/08/2021   Chronic pain syndrome 02/18/2021   HFrEF (heart failure with reduced ejection fraction) (HCC) 01/21/2021   Hypokalemia 12/07/2020   Bradycardia 12/07/2020   Cardiomegaly 12/07/2020   Community acquired pneumonia of right lung 12/07/2020   Chronic midline low back pain 07/27/2020   Psoriasis 07/27/2020   Diabetic neuropathy (HCC) 07/27/2020   OA (osteoarthritis) 05/02/2019   Depression, major, single episode, severe (HCC) 08/23/2018   Acute respiratory failure (HCC) 03/24/2017   Obesity hypoventilation syndrome (HCC) 03/14/2017   Right shoulder pain 11/29/2016   BPH (benign prostatic hyperplasia) 03/14/2016   Hypertension associated with diabetes (HCC) 03/14/2016   Fibromyalgia 03/14/2016   Hyperlipidemia associated with type 2 diabetes mellitus (HCC) 03/14/2016   Insomnia 03/14/2016   COPD (chronic obstructive pulmonary disease) (HCC) 11/26/2015   Severe obstructive sleep apnea  06/15/2015   BMI 45.0-49.9, adult (HCC) 05/26/2015   Type 2 diabetes, controlled, with peripheral neuropathy (HCC) 05/04/2015   Left hip pain 03/16/2015    03/18/2015, PTA 05/10/2021, 10:55 AM  [facility] [place] [facility] [address] [place], [place], [place] Phone: [phone]   Fax:  [phone]  Name: Jonathon Graham MRN: [id number] [id number] Date of Birth: [birth year]

## 2021-05-11 ENCOUNTER — Telehealth: Payer: Self-pay | Admitting: Family Medicine

## 2021-05-11 DIAGNOSIS — E119 Type 2 diabetes mellitus without complications: Secondary | ICD-10-CM

## 2021-05-11 MED ORDER — METFORMIN HCL 1000 MG PO TABS: 1000.0000 mg | ORAL_TABLET | Freq: Every day | ORAL | 1 refills | Status: DC

## 2021-05-11 MED ORDER — DULOXETINE HCL 60 MG PO CPEP: 60.0000 mg | ORAL_CAPSULE | Freq: Every day | ORAL | 1 refills | Status: AC

## 2021-05-11 NOTE — Telephone Encounter (Signed)
°  Encourage patient to contact the pharmacy for refills or they can request refills through [facility]  LAST APPOINTMENT DATE:  Please schedule appointment if longer than 1 year  NEXT APPOINTMENT DATE: 07/08/21  MEDICATION: metformin and cymbolta  Is the patient out of medication? yes  [facility] [address], [place] [address]  Let patient know to contact pharmacy at the end of the day to make sure medication is ready.  Please notify patient to allow 48-72 hours to process  CLINICAL FILLS OUT ALL BELOW:   LAST REFILL:  QTY:  REFILL DATE:    OTHER COMMENTS:    Okay for refill?  Please advise

## 2021-05-11 NOTE — Addendum Note (Signed)
Addended by: Damita Lack on: 05/11/2021 01:09 PM   Modules accepted: Orders

## 2021-05-11 NOTE — Telephone Encounter (Signed)
Refills sent as requested

## 2021-05-17 ENCOUNTER — Ambulatory Visit: Payer: Medicare HMO | Admitting: Physical Therapy

## 2021-05-27 DIAGNOSIS — J449 Chronic obstructive pulmonary disease, unspecified: Secondary | ICD-10-CM | POA: Diagnosis not present

## 2021-06-06 ENCOUNTER — Other Ambulatory Visit: Payer: Self-pay

## 2021-06-06 ENCOUNTER — Ambulatory Visit (HOSPITAL_COMMUNITY): Payer: Medicare HMO | Attending: Cardiovascular Disease

## 2021-06-06 DIAGNOSIS — I519 Heart disease, unspecified: Secondary | ICD-10-CM | POA: Diagnosis not present

## 2021-06-06 DIAGNOSIS — E1169 Type 2 diabetes mellitus with other specified complication: Secondary | ICD-10-CM

## 2021-06-06 DIAGNOSIS — G4730 Sleep apnea, unspecified: Secondary | ICD-10-CM | POA: Insufficient documentation

## 2021-06-06 DIAGNOSIS — E119 Type 2 diabetes mellitus without complications: Secondary | ICD-10-CM | POA: Insufficient documentation

## 2021-06-06 DIAGNOSIS — G4733 Obstructive sleep apnea (adult) (pediatric): Secondary | ICD-10-CM

## 2021-06-06 DIAGNOSIS — E785 Hyperlipidemia, unspecified: Secondary | ICD-10-CM

## 2021-06-06 DIAGNOSIS — I5030 Unspecified diastolic (congestive) heart failure: Secondary | ICD-10-CM

## 2021-06-06 DIAGNOSIS — I517 Cardiomegaly: Secondary | ICD-10-CM | POA: Insufficient documentation

## 2021-06-06 LAB — ECHOCARDIOGRAM COMPLETE
Area-P 1/2: 2.01 cm2
S' Lateral: 4.5 cm

## 2021-06-13 DIAGNOSIS — G4733 Obstructive sleep apnea (adult) (pediatric): Secondary | ICD-10-CM | POA: Diagnosis not present

## 2021-06-19 ENCOUNTER — Other Ambulatory Visit: Payer: Self-pay | Admitting: Family Medicine

## 2021-06-19 DIAGNOSIS — E1142 Type 2 diabetes mellitus with diabetic polyneuropathy: Secondary | ICD-10-CM

## 2021-06-19 NOTE — Telephone Encounter (Signed)
Last office visit 04/07/21 for DM.  Last refilled 03/01/21 for #[id number] with no refills.  Next Appt: 07/08/21 for DM.

## 2021-06-22 ENCOUNTER — Encounter: Payer: Self-pay | Admitting: Family Medicine

## 2021-06-22 ENCOUNTER — Other Ambulatory Visit: Payer: Self-pay

## 2021-06-22 ENCOUNTER — Encounter: Payer: Self-pay | Admitting: Cardiovascular Disease

## 2021-06-22 ENCOUNTER — Ambulatory Visit (INDEPENDENT_AMBULATORY_CARE_PROVIDER_SITE_OTHER): Payer: Medicare HMO | Admitting: Cardiovascular Disease

## 2021-06-22 VITALS — BP 120/80 | HR 108 | Ht 67.0 in | Wt 284.4 lb

## 2021-06-22 DIAGNOSIS — E1169 Type 2 diabetes mellitus with other specified complication: Secondary | ICD-10-CM

## 2021-06-22 DIAGNOSIS — E1159 Type 2 diabetes mellitus with other circulatory complications: Secondary | ICD-10-CM | POA: Diagnosis not present

## 2021-06-22 DIAGNOSIS — I5020 Unspecified systolic (congestive) heart failure: Secondary | ICD-10-CM | POA: Diagnosis not present

## 2021-06-22 DIAGNOSIS — I519 Heart disease, unspecified: Secondary | ICD-10-CM | POA: Diagnosis not present

## 2021-06-22 DIAGNOSIS — E785 Hyperlipidemia, unspecified: Secondary | ICD-10-CM | POA: Diagnosis not present

## 2021-06-22 DIAGNOSIS — G4733 Obstructive sleep apnea (adult) (pediatric): Secondary | ICD-10-CM | POA: Diagnosis not present

## 2021-06-22 DIAGNOSIS — I152 Hypertension secondary to endocrine disorders: Secondary | ICD-10-CM

## 2021-06-22 LAB — HEPATIC FUNCTION PANEL
ALT: 17 IU/L (ref 0–44)
AST: 18 IU/L (ref 0–40)
Albumin: 4.7 g/dL (ref 3.8–4.8)
Alkaline Phosphatase: 68 IU/L (ref 44–121)
Bilirubin Total: 0.8 mg/dL (ref 0.0–1.2)
Bilirubin, Direct: 0.21 mg/dL (ref 0.00–0.40)
Total Protein: 7.5 g/dL (ref 6.0–8.5)

## 2021-06-22 LAB — LIPID PANEL
Chol/HDL Ratio: 2.4 ratio (ref 0.0–5.0)
Cholesterol, Total: 114 mg/dL (ref 100–199)
HDL: 47 mg/dL (ref >39)
LDL Chol Calc (NIH): 54 mg/dL (ref 0–99)
Triglycerides: 58 mg/dL (ref 0–149)
VLDL Cholesterol Cal: 13 mg/dL (ref 5–40)

## 2021-06-22 MED ORDER — METOPROLOL TARTRATE 25 MG PO TABS: 25.0000 mg | ORAL_TABLET | Freq: Two times a day (BID) | ORAL | 4 refills | Status: DC

## 2021-06-22 NOTE — Patient Instructions (Signed)
Medication Instructions:   -Increase metoprolol tartrate (lopressor) to 25mg  twice daily.    *If you need a refill on your cardiac medications before your next appointment, please call your pharmacy*   Lab Work: Your physician recommends that you have labs drawn today: Lipid/liver profile  If you have labs (blood work) drawn today and your tests are completely normal, you will receive your results only by: MyChart Message (if you have MyChart) OR A paper copy in the mail If you have any lab test that is abnormal or we need to change your treatment, we will call you to review the results.   Follow-Up: At [facility], you and your health needs are our priority.  As part of our continuing mission to provide you with exceptional heart care, we have created designated Provider Care Teams.  These Care Teams include your primary Cardiologist (physician) and Advanced Practice Providers (APPs -  Physician Assistants and Nurse Practitioners) who all work together to provide you with the care you need, when you need it.  We recommend signing up for the patient portal called "MyChart".  Sign up information is provided on this After Visit Summary.  MyChart is used to connect with patients for Virtual Visits (Telemedicine).  Patients are able to view lab/test results, encounter notes, upcoming appointments, etc.  Non-urgent messages can be sent to your provider as well.   To learn more about what you can do with MyChart, go to [facility].    Your next appointment:   6 month(s)  The format for your next appointment:   In Person  Provider:   [email or web address], FNP, Edd Fabian, PA-C, Micah Flesher, PA-C, Marjie Skiff, PA-C, Juanda Crumble, DNP, ANP, or Joni Reining, PA-C       Then, Azalee Course, MD will plan to see you again in 12 month(s).

## 2021-06-22 NOTE — Assessment & Plan Note (Signed)
History of left ventricular dysfunction thought to be nonischemic in nature.  His last 2D echocardiogram performed 06/06/2021 revealed an EF of 45% with mild global hypokinesia and grade 1 diastolic dysfunction.  He had no valvular abnormalities.  He did have mild leak dilated a sending thoracic aorta measuring 41 mm.  This was not significantly different than his echo performed 01/18/2021.  He did have a coronary CTA performed 03/25/2021 revealed a coronary calcium score of 128 with nonobstructive CAD.

## 2021-06-22 NOTE — Assessment & Plan Note (Signed)
History of hyperlipidemia on atorvastatin 40 mg a day.  His last lipid profile performed 10/19/2020 revealed total cholesterol 104, LDL 49 HDL 39.  We will repeat a lipid and liver profile this morning.

## 2021-06-22 NOTE — Assessment & Plan Note (Signed)
History of obstructive sleep apnea on CPAP. 

## 2021-06-22 NOTE — Assessment & Plan Note (Signed)
History of essential hypertension a blood pressure measured today at 120/80.  He is on low-dose metoprolol, hydrochlorothiazide.  He is not on an ACE/ARB or Entresto because of soft blood pressure.

## 2021-06-22 NOTE — Progress Notes (Signed)
06/22/2021 [name]   15-Aug-1955  [id number]  Primary Physician Jinny Sanders, MD Primary Cardiologist: Lorretta Harp MD Lupe Carney, [place]  HPI:  Jonathon Graham is a 66 y.o.  severely overweight married African-American male father of 33, grandfather of 62 grandchildren and great grandfather to 60 great grandchildren was referred by Algie Coffer, MD, for evaluation of LV dysfunction.  He is disabled because of back issues.  I last saw him in the office 03/08/2021.  His risk factors include 15 pack years of tobacco abuse having quit last year, treated hyperlipidemia and diabetes.  There is no family history of heart disease.  He is never had a heart tach or stroke.  He denies chest pain or shortness of breath.  He just renovated his house by himself without limitation.  He has lost 100 pounds over the last year to by diet and exercise.  He also contracted COVID back in April of this year and only had minimal symptoms.  A 2D echocardiogram performed 01/18/2021 revealed an EF of 40 to 45% without valvular abnormalities.  Etiology of his LV dysfunction is still unclear.  I began him on low-dose beta-blocker (metoprolol 12.5 mg p.o. twice daily) and had our Pharm.D.'s to titrate this as an outpatient but unfortunately because of "soft blood pressure this could not occur.  He did have a follow-up 2D echo performed 06/06/2021 revealing a similar EF to his previous echo of 45% with grade 1 diastolic dysfunction.  There are no valvular abnormalities.  A coronary CTA performed 03/17/2021 revealed a coronary calcium score of 128 with no obstructive CAD noted.   Current Meds  Medication Sig   albuterol (PROAIR HFA) 108 (90 Base) MCG/ACT inhaler Inhale 1-2 puffs into the lungs every 4 (four) hours as needed for wheezing or shortness of breath.   albuterol (PROVENTIL) (2.5 MG/3ML) 0.083% nebulizer solution Take 3 mLs (2.5 mg total) by nebulization every 6 (six) hours as needed for wheezing or  shortness of breath.   aspirin 81 MG tablet Take 243 mg by mouth daily. Takes 4 67m aspirin daily.   atorvastatin (LIPITOR) 40 MG tablet Take 1 tablet (40 mg total) by mouth daily.   betamethasone valerate ointment (VALISONE) 0.1 % Apply 1 application topically 2 (two) times daily. For up to 10 days. (Patient taking differently: Apply 1 application topically as needed.)   blood glucose meter kit and supplies KIT Dispense based on patient and insurance preference. Use up to four times daily as directed. (FOR ICD-9 250.00, 250.01).   Blood Pressure Monitoring (BLOOD PRESSURE MONITOR/L CUFF) MISC Use to check blood pressure daily-Needs Large Adult Cuff   Cholecalciferol (VITAMIN D3) 1.25 MG (50000 UT) CAPS TAKE 1 CAPSULE BY MOUTH  ONCE A WEEK   clotrimazole (LOTRIMIN) 1 % cream Apply 1 application 2 (two) times daily topically. For 7 days (Patient taking differently: Apply 1 application topically as needed.)   cyclobenzaprine (FLEXERIL) 10 MG tablet Take 1 tablet (10 mg total) by mouth at bedtime as needed for muscle spasms.   Dulaglutide (TRULICITY) 00.34MVQ/2.5ZDSOPN Inject 0.75 mg into the skin once a week.   DULoxetine (CYMBALTA) 60 MG capsule Take 1 capsule (60 mg total) by mouth daily.   Elastic Bandages & Supports (MEDICAL COMPRESSION SOCKS) MISC 1 each by Does not apply route daily.   fluticasone-salmeterol (ADVAIR) 250-50 MCG/ACT AEPB Inhale 1 puff into the lungs every 12 (twelve) hours.   furosemide (LASIX) 40 MG tablet Take 1  tablet (40 mg total) by mouth daily.   gabapentin (NEURONTIN) 300 MG capsule TAKE 1 CAPSULE BY MOUTH THREE TIMES DAILY   hydrochlorothiazide (MICROZIDE) 12.5 MG capsule Take 12.5 mg by mouth daily. Patient reported   ibuprofen (ADVIL) 800 MG tablet Take 1 tablet (800 mg total) by mouth every 8 (eight) hours as needed.   metFORMIN (GLUCOPHAGE) 1000 MG tablet Take 1 tablet (1,000 mg total) by mouth daily.   Misc. Devices (WRIST BRACE) MISC 2 Units by Does not apply  route. Wear on wrists as needed for support   ondansetron (ZOFRAN) 8 MG tablet Take 1 tablet (8 mg total) by mouth every 8 (eight) hours as needed for nausea or vomiting.   oxyCODONE-acetaminophen (PERCOCET/ROXICET) 5-325 MG tablet Take 0.5 tablets by mouth daily as needed for severe pain. Do not fill until 04/20/2021   potassium chloride SA (KLOR-CON) 20 MEQ tablet Take 1 tablet (20 mEq total) by mouth daily. (Patient taking differently: Take 20 mEq by mouth once a week.)   tamsulosin (FLOMAX) 0.4 MG CAPS capsule Take 1 capsule (0.4 mg total) by mouth daily.   [DISCONTINUED] metoprolol tartrate (LOPRESSOR) 25 MG tablet Take 0.5 tablets (12.5 mg total) by mouth 2 (two) times daily.     Allergies  Allergen Reactions   Penicillins Anaphylaxis and Swelling    Social History   Socioeconomic History   Marital status: Married    Spouse name: Not on file   Number of children: Not on file   Years of education: Not on file   Highest education level: Not on file  Occupational History   Occupation: retired  Tobacco Use   Smoking status: Former    Types: Cigarettes    Quit date: 03/28/2017    Years since quitting: 4.2   Smokeless tobacco: Never  Substance and Sexual Activity   Alcohol use: No    Alcohol/week: 0.0 standard drinks   Drug use: No   Sexual activity: Yes  Other Topics Concern   Not on file  Social History Narrative   Not on file   Social Determinants of Health   Financial Resource Strain: Low Risk    Difficulty of Paying Living Expenses: Not hard at all  Food Insecurity: No Food Insecurity   Worried About Charity fundraiser in the Last Year: Never true   [place] in the Last Year: Never true  Transportation Needs: No Transportation Needs   Lack of Transportation (Medical): No   Lack of Transportation (Non-Medical): No  Physical Activity: Inactive   Days of Exercise per Week: 0 days   Minutes of Exercise per Session: 0 min  Stress: Stress Concern Present    Feeling of Stress : To some extent  Social Connections: Not on file  Intimate Partner Violence: Not At Risk   Fear of Current or Ex-Partner: No   Emotionally Abused: No   Physically Abused: No   Sexually Abused: No     Review of Systems: General: negative for chills, fever, night sweats or weight changes.  Cardiovascular: negative for chest pain, dyspnea on exertion, edema, orthopnea, palpitations, paroxysmal nocturnal dyspnea or shortness of breath Dermatological: negative for rash Respiratory: negative for cough or wheezing Urologic: negative for hematuria Abdominal: negative for nausea, vomiting, diarrhea, bright red blood per rectum, melena, or hematemesis Neurologic: negative for visual changes, syncope, or dizziness All other systems reviewed and are otherwise negative except as noted above.    Blood pressure 120/80, pulse (!) 108, height _0  (  1.702 m), weight 284 lb 6.4 oz (129 kg), SpO2 99 %.  General appearance: alert and no distress Neck: no adenopathy, no carotid bruit, no JVD, supple, symmetrical, trachea midline, and thyroid not enlarged, symmetric, no tenderness/mass/nodules Lungs: clear to auscultation bilaterally Heart: regular rate and rhythm, S1, S2 normal, no murmur, click, rub or gallop Extremities: extremities normal, atraumatic, no cyanosis or edema Pulses: 2+ and symmetric Skin: Skin color, texture, turgor normal. No rashes or lesions Neurologic: Grossly normal  EKG not performed today  ASSESSMENT AND PLAN:   Severe obstructive sleep apnea History of obstructive sleep apnea on CPAP  Hypertension associated with diabetes ([place]) History of essential hypertension a blood pressure measured today at 120/80.  He is on low-dose metoprolol, hydrochlorothiazide.  He is not on an ACE/ARB or Entresto because of soft blood pressure.  Hyperlipidemia associated with type 2 diabetes mellitus ([place]) History of hyperlipidemia on atorvastatin 40 mg a day.  His last  lipid profile performed 10/19/2020 revealed total cholesterol 104, LDL 49 HDL 39.  We will repeat a lipid and liver profile this morning.  Left ventricular dysfunction History of left ventricular dysfunction thought to be nonischemic in nature.  His last 2D echocardiogram performed 06/06/2021 revealed an EF of 45% with mild global hypokinesia and grade 1 diastolic dysfunction.  He had no valvular abnormalities.  He did have mild leak dilated a sending thoracic aorta measuring 41 mm.  This was not significantly different than his echo performed 01/18/2021.  He did have a coronary CTA performed 03/25/2021 revealed a coronary calcium score of 128 with nonobstructive CAD.     Lorretta Harp MD FACP,FACC,FAHA, [facility] 06/22/2021 10:01 AM

## 2021-06-27 DIAGNOSIS — J449 Chronic obstructive pulmonary disease, unspecified: Secondary | ICD-10-CM | POA: Diagnosis not present

## 2021-07-04 ENCOUNTER — Other Ambulatory Visit: Payer: Self-pay | Admitting: Family Medicine

## 2021-07-04 DIAGNOSIS — M48061 Spinal stenosis, lumbar region without neurogenic claudication: Secondary | ICD-10-CM

## 2021-07-04 DIAGNOSIS — I10 Essential (primary) hypertension: Secondary | ICD-10-CM

## 2021-07-04 DIAGNOSIS — M25552 Pain in left hip: Secondary | ICD-10-CM

## 2021-07-04 DIAGNOSIS — E119 Type 2 diabetes mellitus without complications: Secondary | ICD-10-CM

## 2021-07-04 MED ORDER — FUROSEMIDE 40 MG PO TABS: 40.0000 mg | ORAL_TABLET | Freq: Every day | ORAL | 0 refills | Status: DC | PRN

## 2021-07-04 NOTE — Telephone Encounter (Signed)
Last office visit 04/07/2021 for DM.  Last refilled 02/18/2021 for #15 with no refills.  UDS/Contract 02/18/2021.  Next Appt: 07/08/2021 for DM.

## 2021-07-08 ENCOUNTER — Ambulatory Visit: Payer: Medicare HMO | Admitting: Family Medicine

## 2021-07-14 ENCOUNTER — Encounter: Payer: Self-pay | Admitting: Family Medicine

## 2021-07-14 ENCOUNTER — Ambulatory Visit (INDEPENDENT_AMBULATORY_CARE_PROVIDER_SITE_OTHER): Payer: Medicare Other | Admitting: Family Medicine

## 2021-07-14 ENCOUNTER — Other Ambulatory Visit: Payer: Self-pay

## 2021-07-14 VITALS — BP 128/70 | HR 65 | Temp 97.7°F | Ht 67.0 in | Wt 289.1 lb

## 2021-07-14 DIAGNOSIS — Z6841 Body Mass Index (BMI) 40.0 and over, adult: Secondary | ICD-10-CM | POA: Diagnosis not present

## 2021-07-14 DIAGNOSIS — M48061 Spinal stenosis, lumbar region without neurogenic claudication: Secondary | ICD-10-CM | POA: Diagnosis not present

## 2021-07-14 DIAGNOSIS — E1142 Type 2 diabetes mellitus with diabetic polyneuropathy: Secondary | ICD-10-CM

## 2021-07-14 DIAGNOSIS — M25552 Pain in left hip: Secondary | ICD-10-CM | POA: Diagnosis not present

## 2021-07-14 DIAGNOSIS — G894 Chronic pain syndrome: Secondary | ICD-10-CM

## 2021-07-14 LAB — POCT GLYCOSYLATED HEMOGLOBIN (HGB A1C): Hemoglobin A1C: 6.4 % — AB (ref 4.0–5.6)

## 2021-07-14 MED ORDER — OZEMPIC (0.25 OR 0.5 MG/DOSE) 2 MG/1.5ML ~~LOC~~ SOPN: 0.5000 mg | PEN_INJECTOR | SUBCUTANEOUS | 3 refills | Status: DC

## 2021-07-14 MED ORDER — OXYCODONE-ACETAMINOPHEN 5-325 MG PO TABS: 0.5000 | ORAL_TABLET | Freq: Two times a day (BID) | ORAL | 0 refills | Status: DC | PRN

## 2021-07-14 NOTE — Progress Notes (Signed)
Patient ID: Jonathon Graham, male    DOB: 08-Jun-1955, 66 y.o.   MRN: [id number]  This visit was conducted in person.  BP 128/70    Pulse 65    Temp 97.7 F (36.5 C) (Temporal)    Ht 5' 7" (1.702 m)    Wt 289 lb 1 oz (131.1 kg)    SpO2 94%    BMI 45.27 kg/m    CC:  Chief Complaint  Patient presents with   Diabetes    Here for 3 mo f/u.    Subjective:   HPI: Jonathon Graham is a 66 y.o. male presenting on 07/14/2021 for Diabetes (Here for 3 mo f/u.)  As well as chronic pain amnagement.  Diabetes:  Good control on Trulicity 3.71 mg weekly and metformin.  He has gained some weight back.. feels like he was doing better with weight loss with ozempic ( could not find for a while) Lab Results  Component Value Date   HGBA1C 6.4 (A) 07/14/2021    He  has cut out rice and pork. Using medications without difficulties: Hypoglycemic episodes: Hyperglycemic episodes: Feet problems: none Blood Sugars averaging: eye exam within last year: due  Wt Readings from Last 3 Encounters:  07/14/21 289 lb 1 oz (131.1 kg)  06/22/21 284 lb 6.4 oz (129 kg)  04/07/21 288 lb 8 oz (130.9 kg)   Indication for chronic opioid: low back pain.Marland Kitchen improving with PT. Medication and dose:  # pills per month:  1/2  tab oxycodone 5/325  1-2 times daily Last UDS date:  02/18/2022 Opioid Treatment Agreement signed (Y/N): Y Opioid Treatment Agreement last reviewed with patient:   yes NCCSRS reviewed this encounter (include red flags):   PDMP reviewed during this encounter.       Relevant past medical, surgical, family and social history reviewed and updated as indicated. Interim medical history since our last visit reviewed. Allergies and medications reviewed and updated. Outpatient Medications Prior to Visit  Medication Sig Dispense Refill   albuterol (PROAIR HFA) 108 (90 Base) MCG/ACT inhaler Inhale 1-2 puffs into the lungs every 4 (four) hours as needed for wheezing or shortness of breath. 1 each 5    albuterol (PROVENTIL) (2.5 MG/3ML) 0.083% nebulizer solution Take 3 mLs (2.5 mg total) by nebulization every 6 (six) hours as needed for wheezing or shortness of breath. 360 mL 6   aspirin 81 MG tablet Take 243 mg by mouth daily. Takes 4 4m aspirin daily.     atorvastatin (LIPITOR) 40 MG tablet Take 1 tablet (40 mg total) by mouth daily. 90 tablet 1   betamethasone valerate ointment (VALISONE) 0.1 % Apply 1 application topically 2 (two) times daily. For up to 10 days. (Patient taking differently: Apply 1 application topically as needed.) 30 g 0   blood glucose meter kit and supplies KIT Dispense based on patient and insurance preference. Use up to four times daily as directed. (FOR ICD-9 250.00, 250.01). 1 each 0   Blood Pressure Monitoring (BLOOD PRESSURE MONITOR/L CUFF) MISC Use to check blood pressure daily-Needs Large Adult Cuff 1 each 0   Cholecalciferol (VITAMIN D3) 1.25 MG (50000 UT) CAPS TAKE 1 CAPSULE BY MOUTH  ONCE A WEEK 12 capsule 0   clotrimazole (LOTRIMIN) 1 % cream Apply 1 application 2 (two) times daily topically. For 7 days (Patient taking differently: Apply 1 application topically as needed.) 28 g 0   Continuous Blood Gluc Sensor (FREESTYLE LIBRE 3 SENSOR) MISC 1 each by Does not  apply route every 14 (fourteen) days. Place 1 sensor on the skin every 14 days. Use to check glucose continuously 2 each 5   cyclobenzaprine (FLEXERIL) 10 MG tablet Take 1 tablet (10 mg total) by mouth at bedtime as needed for muscle spasms. 15 tablet 0   Dulaglutide (TRULICITY) 8.10 FB/5.1WC SOPN Inject 0.75 mg into the skin once a week. 3 mL 11   DULoxetine (CYMBALTA) 60 MG capsule Take 1 capsule (60 mg total) by mouth daily. 90 capsule 1   Elastic Bandages & Supports (MEDICAL COMPRESSION SOCKS) MISC 1 each by Does not apply route daily. 1 each 0   fluticasone-salmeterol (ADVAIR) 250-50 MCG/ACT AEPB Inhale 1 puff into the lungs every 12 (twelve) hours. 60 each 11   furosemide (LASIX) 40 MG tablet Take 1  tablet (40 mg total) by mouth daily as needed. 90 tablet 0   gabapentin (NEURONTIN) 300 MG capsule TAKE 1 CAPSULE BY MOUTH THREE TIMES DAILY 270 capsule 0   hydrochlorothiazide (MICROZIDE) 12.5 MG capsule Take 1 capsule by mouth once daily 90 capsule 0   ibuprofen (ADVIL) 800 MG tablet Take 1 tablet (800 mg total) by mouth every 8 (eight) hours as needed. 60 tablet 0   metFORMIN (GLUCOPHAGE) 1000 MG tablet Take 1 tablet by mouth once daily 90 tablet 0   metoprolol tartrate (LOPRESSOR) 25 MG tablet Take 1 tablet (25 mg total) by mouth 2 (two) times daily. 180 tablet 4   Misc. Devices (WRIST BRACE) MISC 2 Units by Does not apply route. Wear on wrists as needed for support     ondansetron (ZOFRAN) 8 MG tablet Take 1 tablet (8 mg total) by mouth every 8 (eight) hours as needed for nausea or vomiting. 12 tablet 0   oxyCODONE-acetaminophen (PERCOCET/ROXICET) 5-325 MG tablet Take 0.5 tablets by mouth daily as needed for severe pain. Do not fill until 04/20/2021 15 tablet 0   potassium chloride SA (KLOR-CON) 20 MEQ tablet Take 1 tablet (20 mEq total) by mouth daily. (Patient taking differently: Take 20 mEq by mouth once a week.) 90 tablet 0   tamsulosin (FLOMAX) 0.4 MG CAPS capsule Take 1 capsule (0.4 mg total) by mouth daily. 90 capsule 3   No facility-administered medications prior to visit.     Per HPI unless specifically indicated in ROS section below Review of Systems Objective:  BP 128/70    Pulse 65    Temp 97.7 F (36.5 C) (Temporal)    Ht 5' 7" (1.702 m)    Wt 289 lb 1 oz (131.1 kg)    SpO2 94%    BMI 45.27 kg/m   Wt Readings from Last 3 Encounters:  07/14/21 289 lb 1 oz (131.1 kg)  06/22/21 284 lb 6.4 oz (129 kg)  04/07/21 288 lb 8 oz (130.9 kg)      Physical Exam    Results for orders placed or performed in visit on 07/14/21  POCT glycosylated hemoglobin (Hb A1C)  Result Value Ref Range   Hemoglobin A1C 6.4 (A) 4.0 - 5.6 %   HbA1c POC (<> result, manual entry)     HbA1c, POC  (prediabetic range)     HbA1c, POC (controlled diabetic range)      This visit occurred during the SARS-CoV-2 public health emergency.  Safety protocols were in place, including screening questions prior to the visit, additional usage of staff PPE, and extensive cleaning of exam room while observing appropriate contact time as indicated for disinfecting solutions.   COVID 19 screen:  No recent travel or known exposure to [place] The patient denies respiratory symptoms of COVID 19 at this time. The importance of social distancing was discussed today.   Assessment and Plan Problem List Items Addressed This Visit     BMI 45.0-49.9, adult ([place]) (Chronic)    Improving with GLP1 medication.. per pt doing better on ozempic and will switch back now that it is available.      Relevant Medications   Semaglutide,0.25 or 0.5MG/DOS, (OZEMPIC, 0.25 OR 0.5 MG/DOSE,) 2 MG/1.5ML SOPN   Type 2 diabetes, controlled, with peripheral neuropathy (HCC) - Primary (Chronic)    Stable , chronic  metformin 2376EG daily  Trulicity.. change back to ozempic but start at  Higher dose 0.5 mg weekly given plateaued weight loss.      Relevant Medications   Semaglutide,0.25 or 0.5MG/DOS, (OZEMPIC, 0.25 OR 0.5 MG/DOSE,) 2 MG/1.5ML SOPN   Other Relevant Orders   POCT glycosylated hemoglobin (Hb A1C) (Completed)   Chronic pain syndrome    PDMP reviewed during this encounter.  No red flags.  Using oxycodone 1/2 tab 1-2 times daily.       Relevant Medications   oxyCODONE-acetaminophen (PERCOCET/ROXICET) 5-325 MG tablet   Left hip pain   Relevant Medications   oxyCODONE-acetaminophen (PERCOCET/ROXICET) 5-325 MG tablet   Spinal stenosis of lumbar region   Relevant Medications   oxyCODONE-acetaminophen (PERCOCET/ROXICET) 5-325 MG tablet   Orders Placed This Encounter  Procedures   POCT glycosylated hemoglobin (Hb A1C)      Meds ordered this encounter  Medications   Semaglutide,0.25 or 0.5MG/DOS, (OZEMPIC,  0.25 OR 0.5 MG/DOSE,) 2 MG/1.5ML SOPN    Sig: Inject 0.5 mg into the skin once a week.    Dispense:  4.5 mL    Refill:  3   DISCONTD: oxyCODONE-acetaminophen (PERCOCET/ROXICET) 5-325 MG tablet    Sig: Take 0.5 tablets by mouth 2 (two) times daily as needed for severe pain.    Dispense:  30 tablet    Refill:  0   oxyCODONE-acetaminophen (PERCOCET/ROXICET) 5-325 MG tablet    Sig: Take 0.5 tablets by mouth 2 (two) times daily as needed for severe pain. Fill after 08/25/2021    Dispense:  30 tablet    Refill:  0     Eliezer Lofts, MD

## 2021-07-14 NOTE — Patient Instructions (Signed)
Change back to Ozempic but at higher dose for weight loss as discussed.  Keep working on [employer], lifestyle and regular exercise.

## 2021-07-14 NOTE — Assessment & Plan Note (Signed)
Improving with GLP1 medication.. per pt doing better on ozempic and will switch back now that it is available.

## 2021-07-14 NOTE — Assessment & Plan Note (Signed)
PDMP reviewed during this encounter.  No red flags.  Using oxycodone 1/2 tab 1-2 times daily.

## 2021-07-14 NOTE — Assessment & Plan Note (Signed)
Stable , chronic  metformin 1000mg  daily  Trulicity.. change back to ozempic but start at  Higher dose 0.5 mg weekly given plateaued weight loss.

## 2021-07-26 DIAGNOSIS — J449 Chronic obstructive pulmonary disease, unspecified: Secondary | ICD-10-CM | POA: Diagnosis not present

## 2021-08-25 ENCOUNTER — Other Ambulatory Visit: Payer: Self-pay | Admitting: Family Medicine

## 2021-08-25 DIAGNOSIS — E119 Type 2 diabetes mellitus without complications: Secondary | ICD-10-CM

## 2021-08-25 DIAGNOSIS — J449 Chronic obstructive pulmonary disease, unspecified: Secondary | ICD-10-CM | POA: Diagnosis not present

## 2021-09-22 ENCOUNTER — Ambulatory Visit: Payer: Medicare HMO

## 2021-09-22 ENCOUNTER — Ambulatory Visit (INDEPENDENT_AMBULATORY_CARE_PROVIDER_SITE_OTHER): Payer: Medicare Other

## 2021-09-22 VITALS — Ht 67.0 in | Wt 289.0 lb

## 2021-09-22 DIAGNOSIS — Z0000 Encounter for general adult medical examination without abnormal findings: Secondary | ICD-10-CM

## 2021-09-22 NOTE — Progress Notes (Signed)
? ?Subjective:  ? Jonathon Graham is a 66 y.o. male who presents for Medicare Annual/Subsequent preventive examination. ? ?Review of Systems    ?Virtual Visit via Telephone Note ? ?I connected with  Jonathon Graham on 09/22/21 at 12:30 PM EDT by telephone and verified that I am speaking with the correct person using two identifiers. ? ?Location: ?Patient: Home ?Provider: Office ?Persons participating in the virtual visit: patient/Nurse Health Advisor ?  ?I discussed the limitations, risks, security and privacy concerns of performing an evaluation and management service by telephone and the availability of in person appointments. The patient expressed understanding and agreed to proceed. ? ?Interactive audio and video telecommunications were attempted between this nurse and patient, however failed, due to patient having technical difficulties OR patient did not have access to video capability.  We continued and completed visit with audio only. ? ?Some vital signs may be absent or patient reported.  ? ?Jonathon Peaches, LPN  ?Cardiac Risk Factors include: advanced age (>87mn, >>73women);diabetes mellitus;male gender;hypertension ? ?   ?Objective:  ?  ?Today's Vitals  ? 09/22/21 1233 09/22/21 1234  ?Weight: 289 lb (131.1 kg)   ?Height: _0  (1.702 m)   ?PainSc:  7   ? ?Body mass index is 45.26 kg/m?. ? ? ?  09/22/2021  ? 12:51 PM 09/21/2020  ? 12:02 PM 05/26/2020  ? 11:12 AM 03/24/2017  ?  7:39 PM 03/24/2017  ?  2:56 PM 03/15/2017  ?  5:38 PM 03/15/2017  ? 11:28 AM  ?Advanced Directives  ?Does Patient Have a Medical Advance Directive? Yes Yes _1   ?Type of [place]of [place] will       ?Does patient want to make changes to medical advance directive? No - Patient declined        ?Copy of [place] Chart? No - copy requested No - copy requested       ?Would patient like information on creating a medical advance  directive? No - Patient declined No - Patient declined Yes (MAU/Ambulatory/Procedural Areas - Information given) No - Patient declined  Yes (ED - Information included in AVS)   ? ? ?Current Medications (verified) ?Outpatient Encounter Medications as of 09/22/2021  ?Medication Sig  ? albuterol (PROAIR HFA) 108 (90 Base) MCG/ACT inhaler Inhale 1-2 puffs into the lungs every 4 (four) hours as needed for wheezing or shortness of breath.  ? albuterol (PROVENTIL) (2.5 MG/3ML) 0.083% nebulizer solution Take 3 mLs (2.5 mg total) by nebulization every 6 (six) hours as needed for wheezing or shortness of breath.  ? aspirin 81 MG tablet Take 243 mg by mouth daily. Takes 4 826maspirin daily.  ? atorvastatin (LIPITOR) 40 MG tablet Take 1 tablet (40 mg total) by mouth daily.  ? betamethasone valerate ointment (VALISONE) 0.1 % Apply 1 application topically 2 (two) times daily. For up to 10 days. (Patient taking differently: Apply 1 application topically as needed.)  ? blood glucose meter kit and supplies KIT Dispense based on patient and insurance preference. Use up to four times daily as directed. (FOR ICD-9 250.00, 250.01).  ? Blood Pressure Monitoring (BLOOD PRESSURE MONITOR/L CUFF) MISC Use to check blood pressure daily-Needs Large Adult Cuff  ? Cholecalciferol (VITAMIN D3) 1.25 MG (50000 UT) CAPS TAKE 1 CAPSULE BY MOUTH  ONCE A WEEK  ? clotrimazole (LOTRIMIN) 1 % cream Apply 1 application 2 (two) times daily topically. For 7 days (Patient  taking differently: Apply 1 application topically as needed.)  ? Continuous Blood Gluc Sensor (FREESTYLE LIBRE 3 SENSOR) MISC 1 each by Does not apply route every 14 (fourteen) days. Place 1 sensor on the skin every 14 days. Use to check glucose continuously  ? cyclobenzaprine (FLEXERIL) 10 MG tablet Take 1 tablet (10 mg total) by mouth at bedtime as needed for muscle spasms.  ? DULoxetine (CYMBALTA) 60 MG capsule Take 1 capsule (60 mg total) by mouth daily.  ? Elastic Bandages & Supports  (MEDICAL COMPRESSION SOCKS) MISC 1 each by Does not apply route daily.  ? fluticasone-salmeterol (ADVAIR) 250-50 MCG/ACT AEPB Inhale 1 puff into the lungs every 12 (twelve) hours.  ? furosemide (LASIX) 40 MG tablet Take 1 tablet (40 mg total) by mouth daily as needed.  ? gabapentin (NEURONTIN) 300 MG capsule TAKE 1 CAPSULE BY MOUTH THREE TIMES DAILY  ? hydrochlorothiazide (MICROZIDE) 12.5 MG capsule Take 1 capsule by mouth once daily  ? ibuprofen (ADVIL) 800 MG tablet Take 1 tablet (800 mg total) by mouth every 8 (eight) hours as needed.  ? metFORMIN (GLUCOPHAGE) 1000 MG tablet Take 1 tablet by mouth once daily  ? metoprolol tartrate (LOPRESSOR) 25 MG tablet Take 1 tablet (25 mg total) by mouth 2 (two) times daily.  ? Misc. Devices (WRIST BRACE) MISC 2 Units by Does not apply route. Wear on wrists as needed for support  ? ondansetron (ZOFRAN) 8 MG tablet Take 1 tablet (8 mg total) by mouth every 8 (eight) hours as needed for nausea or vomiting.  ? oxyCODONE-acetaminophen (PERCOCET/ROXICET) 5-325 MG tablet Take 0.5 tablets by mouth 2 (two) times daily as needed for severe pain. Fill after 08/25/2021  ? potassium chloride SA (KLOR-CON) 20 MEQ tablet Take 1 tablet (20 mEq total) by mouth daily. (Patient taking differently: Take 20 mEq by mouth once a week.)  ? Semaglutide,0.25 or 0.5MG/DOS, (OZEMPIC, 0.25 OR 0.5 MG/DOSE,) 2 MG/1.5ML SOPN Inject 0.5 mg into the skin once a week.  ? tamsulosin (FLOMAX) 0.4 MG CAPS capsule Take 1 capsule (0.4 mg total) by mouth daily.  ? ?No facility-administered encounter medications on file as of 09/22/2021.  ? ? ?Allergies (verified) ?Penicillins  ? ?History: ?Past Medical History:  ?Diagnosis Date  ? Arthritis   ? Benign prostatic hyperplasia (BPH) with urinary urgency   ? nodular  ? COPD (chronic obstructive pulmonary disease) ([place])   ? Depression   ? Diabetes mellitus without complication ([place])   ? Essential hypertension   ? Fibromyalgia, primary   ? Hyperlipidemia   ? Hypertension    ? Obesity   ? ?History reviewed. No pertinent surgical history. ?Family History  ?Problem Relation Age of Onset  ? Heart disease Father   ? Diabetes Mellitus II Sister   ? Myasthenia gravis Sister   ? Fibrocystic breast disease Sister   ? Hypertension Sister   ? Cancer Sister 49  ?      sinus cancer  ? Diabetes Mellitus II Brother   ? Heart disease Mother   ? ?Social History  ? ?Socioeconomic History  ? Marital status: Married  ?  Spouse name: Not on file  ? Number of children: Not on file  ? Years of education: Not on file  ? Highest education level: Not on file  ?Occupational History  ? Occupation: retired  ?Tobacco Use  ? Smoking status: Former  ?  Types: Cigarettes  ?  Quit date: 03/28/2017  ?  Years since quitting: 4.4  ? Smokeless  tobacco: Never  ?Substance and Sexual Activity  ? Alcohol use: No  ?  Alcohol/week: 0.0 standard drinks  ? Drug use: No  ? Sexual activity: Yes  ?Other Topics Concern  ? Not on file  ?Social History Narrative  ? Not on file  ? ?Social Determinants of Health  ? ?Financial Resource Strain: Low Risk   ? Difficulty of Paying Living Expenses: Not hard at all  ?Food Insecurity: No Food Insecurity  ? Worried About Charity fundraiser in the Last Year: Never true  ? Ran Out of Food in the Last Year: Never true  ?Transportation Needs: No Transportation Needs  ? Lack of Transportation (Medical): No  ? Lack of Transportation (Non-Medical): No  ?Physical Activity: Sufficiently Active  ? Days of Exercise per Week: 3 days  ? Minutes of Exercise per Session: 150+ min  ?Stress: No Stress Concern Present  ? Feeling of Stress : Not at all  ?Social Connections: Socially Integrated  ? Frequency of Communication with Friends and Family: More than three times a week  ? Frequency of Social Gatherings with Friends and Family: More than three times a week  ? Attends Religious Services: More than 4 times per year  ? Active Member of Clubs or Organizations: Yes  ? Attends Archivist Meetings: More  than 4 times per year  ? Marital Status: Married  ? ? ?Tobacco Counseling ?Counseling given: Not Answered ? ? ?Clinical Intake: ? ?Pre-visit preparation completed: NoNutrition Risk Assessment: ? ?Has the patie

## 2021-09-22 NOTE — Patient Instructions (Addendum)
?Jonathon Graham , ?Thank you for taking time to come for your Medicare Wellness Visit. I appreciate your ongoing commitment to your health goals. Please review the following plan we discussed and let me know if I can assist you in the future.  ? ?These are the goals we discussed: ? Goals   ? ?   Patient Stated   ?   09/21/2020, I will maintain and continue medications as prescribed. ?  ?   Weight (lb) < 200 lb (90.7 kg) (pt-stated)   ?   I want to lose 40lbs.with diet changes. ?  ? ?  ? ?Opioid Pain Medicine Management ?Opioids are powerful medicines that are used to treat moderate to severe pain. When used for short periods of time, they can help you to: ?Sleep better. ?Do better in physical or occupational therapy. ?Feel better in the first few days after an injury. ?Recover from surgery. ?Opioids should be taken with the supervision of a trained health care provider. They should be taken for the shortest period of time possible. This is because opioids can be addictive, and the longer you take opioids, the greater your risk of addiction. This addiction can also be called opioid use disorder. ?What are the risks? ?Using opioid pain medicines for longer than 3 days increases your risk of side effects. Side effects include: ?Constipation. ?Nausea and vomiting. ?Breathing difficulties (respiratory depression). ?Drowsiness. ?Confusion. ?Opioid use disorder. ?Itching. ?Taking opioid pain medicine for a long period of time can affect your ability to do daily tasks. It also puts you at risk for: ?Motor vehicle crashes. ?Depression. ?Suicide. ?Heart attack. ?Overdose, which can be life-threatening. ?What is a pain treatment plan? ?A pain treatment plan is an agreement between you and your health care provider. Pain is unique to each person, and treatments vary depending on your condition. To manage your pain, you and your health care provider need to work together. To help you do this: ?Discuss the goals of your treatment,  including how much pain you might expect to have and how you will manage the pain. ?Review the risks and benefits of taking opioid medicines. ?Remember that a good treatment plan uses more than one approach and minimizes the chance of side effects. ?Be honest about the amount of medicines you take and about any drug or alcohol use. ?Get pain medicine prescriptions from only one health care provider. ?Pain can be managed with many types of alternative treatments. Ask your health care provider to refer you to one or more specialists who can help you manage pain through: ?Physical or occupational therapy. ?Counseling (cognitive behavioral therapy). ?Good nutrition. ?Biofeedback. ?Massage. ?Meditation. ?Non-opioid medicine. ?Following a gentle exercise program. ?How to use opioid pain medicine ?Taking medicine ?Take your pain medicine exactly as told by your health care provider. Take it only when you need it. ?If your pain gets less severe, you may take less than your prescribed dose if your health care provider approves. ?If you are not having pain, do nottake pain medicine unless your health care provider tells you to take it. ?If your pain is severe, do nottry to treat it yourself by taking more pills than instructed on your prescription. Contact your health care provider for help. ?Write down the times when you take your pain medicine. It is easy to become confused while on pain medicine. Writing the time can help you avoid overdose. ?Take other over-the-counter or prescription medicines only as told by your health care provider. ?Keeping yourself and others  safe ? ?While you are taking opioid pain medicine: ?Do not drive, use machinery, or power tools. ?Do not sign legal documents. ?Do not drink alcohol. ?Do not take sleeping pills. ?Do not supervise children by yourself. ?Do not do activities that require climbing or being in high places. ?Do not go to a lake, river, ocean, spa, or swimming pool. ?Do not share  your pain medicine with anyone. ?Keep pain medicine in a locked cabinet or in a secure area where pets and children cannot reach it. ?Stopping your use of opioids ?If you have been taking opioid medicine for more than a few weeks, you may need to slowly decrease (taper) how much you take until you stop completely. Tapering your use of opioids can decrease your risk of symptoms of withdrawal, such as: ?Pain and cramping in the abdomen. ?Nausea. ?Sweating. ?Sleepiness. ?Restlessness. ?Uncontrollable shaking (tremors). ?Cravings for the medicine. ?Do not attempt to taper your use of opioids on your own. Talk with your health care provider about how to do this. Your health care provider may prescribe a step-down schedule based on how much medicine you are taking and how long you have been taking it. ?Getting rid of leftover pills ?Do not save any leftover pills. Get rid of leftover pills safely by: ?Taking the medicine to a prescription take-back program. This is usually offered by the county or law enforcement. ?Bringing them to a pharmacy that has a drug disposal container. ?Flushing them down the toilet. Check the label or package insert of your medicine to see whether this is safe to do. ?Throwing them out in the trash. Check the label or package insert of your medicine to see whether this is safe to do. ?If it is safe to throw it out, remove the medicine from the original container, put it into a sealable bag or container, and mix it with used coffee grounds, food scraps, dirt, or cat litter before putting it in the trash. ?Follow these instructions at home: ?Activity ?Do exercises as told by your health care provider. ?Avoid activities that make your pain worse. ?Return to your normal activities as told by your health care provider. Ask your health care provider what activities are safe for you. ?General instructions ?You may need to take these actions to prevent or treat constipation: ?Drink enough fluid to keep  your urine pale yellow. ?Take over-the-counter or prescription medicines. ?Eat foods that are high in fiber, such as beans, whole grains, and fresh fruits and vegetables. ?Limit foods that are high in fat and processed sugars, such as fried or sweet foods. ?Keep all follow-up visits. This is important. ?Where to find support ?If you have been taking opioids for a long time, you may benefit from receiving support for quitting from a local support group or counselor. Ask your health care provider for a referral to these resources in your area. ?Where to find more information ?Centers for Disease Control and Prevention (CDC): FootballExhibition.com.br ?[place].S. Food and Drug Administration (FDA): PumpkinSearch.com.ee ?Get help right away if: ?You may have taken too much of an opioid (overdosed). Common symptoms of an overdose: ?Your breathing is slower or more shallow than normal. ?You have a very slow heartbeat (pulse). ?You have slurred speech. ?You have nausea and vomiting. ?Your pupils become very small. ?You have other potential symptoms: ?You are very confused. ?You faint or feel like you will faint. ?You have cold, clammy skin. ?You have blue lips or fingernails. ?You have thoughts of harming yourself or harming others. ?  These symptoms may represent a serious problem that is an emergency. Do not wait to see if the symptoms will go away. Get medical help right away. Call your local emergency services (911 in the [place] Do not drive yourself to the hospital.  ?If you ever feel like you may hurt yourself or others, or have thoughts about taking your own life, get help right away. Go to your nearest emergency department or: ?Call your local emergency services (911 in the [place] ?Call the [facility] (229-712-88191-830-254-7067 in the [place] ?Call a suicide crisis helpline, such as the National Suicide Prevention Lifeline at (803)690-49341-(519)849-7693 or 988 in the [place] This is open 24 hours a day in the [place] ?Text the Crisis Text Line at (563) 131-4731741741  (in the [place] ?Summary ?Opioid medicines can help you manage moderate to severe pain for a short period of time. ?A pain treatment plan is an agreement between you and your health care provider. Discuss t

## 2021-09-25 DIAGNOSIS — J449 Chronic obstructive pulmonary disease, unspecified: Secondary | ICD-10-CM | POA: Diagnosis not present

## 2021-10-02 ENCOUNTER — Other Ambulatory Visit: Payer: Self-pay | Admitting: Family Medicine

## 2021-10-02 ENCOUNTER — Other Ambulatory Visit: Payer: Self-pay | Admitting: Cardiovascular Disease

## 2021-10-02 DIAGNOSIS — E119 Type 2 diabetes mellitus without complications: Secondary | ICD-10-CM

## 2021-10-02 DIAGNOSIS — E1169 Type 2 diabetes mellitus with other specified complication: Secondary | ICD-10-CM

## 2021-10-03 LAB — HM DIABETES FOOT EXAM

## 2021-10-04 ENCOUNTER — Encounter: Payer: Self-pay | Admitting: Physical Therapy

## 2021-10-04 NOTE — Therapy (Signed)
[facility] ?[place] [address] ?[place], [place], [address] ?Phone: [phone]   Fax:  [phone] ? ?Patient Details  ?Name: Jonathon Graham ?MRN: [id number] ?Date of Birth: [birth year] ?Referring Provider:  No ref. provider found ? ?Encounter Date: 10/04/2021 ? ?PHYSICAL THERAPY DISCHARGE SUMMARY ? ?Visits from Start of Care: 8 ? ?Current functional level related to goals / functional outcomes: ?Did not return since last visit DC per policy  ?  ?Remaining deficits: ?Unable to assess  ?  ?Education / Equipment: ?N/A   ? ?Patient agrees to discharge. Patient goals were not met. Patient is being discharged due to not returning since the last visit. ? ? ?Seydina Holliman U PT, DPT, PN2  ? ?Supplemental Physical Therapist ?[facility]  ? ? ? ? ? ?[facility] ?[address] ?[place], [place], [address] ?Phone: [phone]   Fax:  [phone] ?

## 2021-10-11 ENCOUNTER — Encounter: Payer: Self-pay | Admitting: Family Medicine

## 2021-10-11 ENCOUNTER — Ambulatory Visit (INDEPENDENT_AMBULATORY_CARE_PROVIDER_SITE_OTHER): Payer: Medicare Other | Admitting: Family Medicine

## 2021-10-11 VITALS — BP 90/58 | HR 51 | Temp 98.3°F | Ht 67.0 in | Wt 293.2 lb

## 2021-10-11 DIAGNOSIS — G894 Chronic pain syndrome: Secondary | ICD-10-CM

## 2021-10-11 DIAGNOSIS — I152 Hypertension secondary to endocrine disorders: Secondary | ICD-10-CM

## 2021-10-11 DIAGNOSIS — M25552 Pain in left hip: Secondary | ICD-10-CM

## 2021-10-11 DIAGNOSIS — E1159 Type 2 diabetes mellitus with other circulatory complications: Secondary | ICD-10-CM | POA: Diagnosis not present

## 2021-10-11 DIAGNOSIS — Z6841 Body Mass Index (BMI) 40.0 and over, adult: Secondary | ICD-10-CM

## 2021-10-11 DIAGNOSIS — E662 Morbid (severe) obesity with alveolar hypoventilation: Secondary | ICD-10-CM

## 2021-10-11 DIAGNOSIS — I5020 Unspecified systolic (congestive) heart failure: Secondary | ICD-10-CM

## 2021-10-11 DIAGNOSIS — J439 Emphysema, unspecified: Secondary | ICD-10-CM

## 2021-10-11 DIAGNOSIS — M48061 Spinal stenosis, lumbar region without neurogenic claudication: Secondary | ICD-10-CM

## 2021-10-11 DIAGNOSIS — E1142 Type 2 diabetes mellitus with diabetic polyneuropathy: Secondary | ICD-10-CM | POA: Diagnosis not present

## 2021-10-11 DIAGNOSIS — E1149 Type 2 diabetes mellitus with other diabetic neurological complication: Secondary | ICD-10-CM | POA: Diagnosis not present

## 2021-10-11 LAB — POCT GLYCOSYLATED HEMOGLOBIN (HGB A1C): Hemoglobin A1C: 6.7 % — AB (ref 4.0–5.6)

## 2021-10-11 MED ORDER — ALBUTEROL SULFATE HFA 108 (90 BASE) MCG/ACT IN AERS: 1.0000 | INHALATION_SPRAY | RESPIRATORY_TRACT | 5 refills | Status: DC | PRN

## 2021-10-11 MED ORDER — SEMAGLUTIDE (1 MG/DOSE) 4 MG/3ML ~~LOC~~ SOPN: 1.0000 mg | PEN_INJECTOR | SUBCUTANEOUS | 11 refills | Status: DC

## 2021-10-11 MED ORDER — OXYCODONE-ACETAMINOPHEN 5-325 MG PO TABS: 0.5000 | ORAL_TABLET | Freq: Two times a day (BID) | ORAL | 0 refills | Status: DC | PRN

## 2021-10-11 NOTE — Assessment & Plan Note (Signed)
Associated with diabetes.  Tolerable control on gabapentin 300 mg 3 times daily ?

## 2021-10-11 NOTE — Assessment & Plan Note (Signed)
Associated with significant comorbidity. ? ?He will increase Ozempic to 1 mg weekly and continue work on lifestyle changes. ?

## 2021-10-11 NOTE — Progress Notes (Signed)
? ? Patient ID: Jonathon Graham, male    DOB: 1955-11-01, 66 y.o.   MRN: [id number] ? ?This visit was conducted in person. ? ?BP (!) 90/58   Pulse (!) 51   Temp 98.3 ?F (36.8 ?C) (Oral)   Ht '5\' 7"'  (1.702 m)   Wt 293 lb 4 oz (133 kg)   SpO2 97%   BMI 45.93 kg/m?   ? ?CC:  ?Chief Complaint  ?Patient presents with  ? Diabetes  ? Pain Management  ? ? ?Subjective:  ? ?HPI: ?Jonathon Graham is a 66 y.o. male presenting on 10/11/2021 for Diabetes and Pain Management ? ?Diabetes: Good control although slightly worsened A1c compared to 3 months ago.  He is currently back on Ozempic at 0.5 mg weekly (Trulicity did not seem to be as effective for weight management) and metformin 1000 mg daily. ?Lab Results  ?Component Value Date  ? HGBA1C 6.7 (A) 10/11/2021  ?Using medications without difficulties: ?Hypoglycemic episodes: ?Hyperglycemic episodes: ?Feet problems: ?Blood Sugars averaging: Using freestyle libre glucometer ?eye exam within last year: ?Wt Readings from Last 3 Encounters:  ?10/11/21 293 lb 4 oz (133 kg)  ?09/22/21 289 lb (131.1 kg)  ?07/14/21 289 lb 1 oz (131.1 kg)  ? ?Morbid obesity Body mass index is 45.93 kg/m?. ? He has been unable to eat well, given increased stress. ? Having trouble to get back into see counselor. ? ?Indication for chronic opioid: Chronic low back pain ?Medication and dose: Half tab oxycodone 5/325 1-2 times daily as needed pain ?# pills per month: ?Last UDS date: 02/18/2021 ?Opioid Treatment Agreement signed (Y/N): Y ?Opioid Treatment Agreement last reviewed with patient:  Y ?[place] reviewed this encounter (include red flags):    no red flags. PDMP reviewed during this encounter. ? ? ?    ?BP Readings from Last 3 Encounters:  ?10/11/21 (!) 90/58  ?07/14/21 128/70  ?06/22/21 120/80  ? ? ? ?Relevant past medical, surgical, family and social history reviewed and updated as indicated. Interim medical history since our last visit reviewed. ?Allergies and medications reviewed and  updated. ?Outpatient Medications Prior to Visit  ?Medication Sig Dispense Refill  ? albuterol (PROAIR HFA) 108 (90 Base) MCG/ACT inhaler Inhale 1-2 puffs into the lungs every 4 (four) hours as needed for wheezing or shortness of breath. 1 each 5  ? albuterol (PROVENTIL) (2.5 MG/3ML) 0.083% nebulizer solution Take 3 mLs (2.5 mg total) by nebulization every 6 (six) hours as needed for wheezing or shortness of breath. 360 mL 6  ? aspirin 81 MG tablet Take 243 mg by mouth daily. Takes 4 71m aspirin daily.    ? atorvastatin (LIPITOR) 40 MG tablet Take 1 tablet by mouth once daily 90 tablet 3  ? betamethasone valerate ointment (VALISONE) 0.1 % Apply 1 application topically 2 (two) times daily. For up to 10 days. (Patient taking differently: Apply 1 application. topically as needed.) 30 g 0  ? blood glucose meter kit and supplies KIT Dispense based on patient and insurance preference. Use up to four times daily as directed. (FOR ICD-9 250.00, 250.01). 1 each 0  ? Cholecalciferol (VITAMIN D3) 1.25 MG (50000 UT) CAPS TAKE 1 CAPSULE BY MOUTH  ONCE A WEEK 12 capsule 0  ? clotrimazole (LOTRIMIN) 1 % cream Apply 1 application 2 (two) times daily topically. For 7 days (Patient taking differently: Apply 1 application. topically as needed.) 28 g 0  ? cyclobenzaprine (FLEXERIL) 10 MG tablet Take 1 tablet (10 mg total) by mouth at bedtime as  needed for muscle spasms. 15 tablet 0  ? DULoxetine (CYMBALTA) 60 MG capsule Take 1 capsule (60 mg total) by mouth daily. 90 capsule 1  ? fluticasone-salmeterol (ADVAIR) 250-50 MCG/ACT AEPB Inhale 1 puff into the lungs every 12 (twelve) hours. 60 each 11  ? furosemide (LASIX) 40 MG tablet Take 1 tablet (40 mg total) by mouth daily as needed. 90 tablet 0  ? gabapentin (NEURONTIN) 300 MG capsule TAKE 1 CAPSULE BY MOUTH THREE TIMES DAILY 270 capsule 0  ? hydrochlorothiazide (MICROZIDE) 12.5 MG capsule Take 1 capsule by mouth once daily 90 capsule 0  ? ibuprofen (ADVIL) 800 MG tablet Take 1 tablet  (800 mg total) by mouth every 8 (eight) hours as needed. 60 tablet 0  ? metFORMIN (GLUCOPHAGE) 1000 MG tablet Take 1 tablet by mouth once daily 90 tablet 0  ? metoprolol tartrate (LOPRESSOR) 25 MG tablet Take 1 tablet (25 mg total) by mouth 2 (two) times daily. 180 tablet 4  ? ondansetron (ZOFRAN) 8 MG tablet Take 1 tablet (8 mg total) by mouth every 8 (eight) hours as needed for nausea or vomiting. 12 tablet 0  ? oxyCODONE-acetaminophen (PERCOCET/ROXICET) 5-325 MG tablet Take 0.5 tablets by mouth 2 (two) times daily as needed for severe pain. Fill after 08/25/2021 30 tablet 0  ? potassium chloride SA (KLOR-CON) 20 MEQ tablet Take 1 tablet (20 mEq total) by mouth daily. (Patient taking differently: Take 20 mEq by mouth once a week.) 90 tablet 0  ? Semaglutide,0.25 or 0.5MG/DOS, (OZEMPIC, 0.25 OR 0.5 MG/DOSE,) 2 MG/1.5ML SOPN Inject 0.5 mg into the skin once a week. 4.5 mL 3  ? tamsulosin (FLOMAX) 0.4 MG CAPS capsule Take 1 capsule (0.4 mg total) by mouth daily. 90 capsule 3  ? Continuous Blood Gluc Sensor (FREESTYLE LIBRE 3 SENSOR) MISC 1 each by Does not apply route every 14 (fourteen) days. Place 1 sensor on the skin every 14 days. Use to check glucose continuously (Patient not taking: Reported on 10/11/2021) 2 each 5  ? Blood Pressure Monitoring (BLOOD PRESSURE MONITOR/L CUFF) MISC Use to check blood pressure daily-Needs Large Adult Cuff 1 each 0  ? Elastic Bandages & Supports (MEDICAL COMPRESSION SOCKS) MISC 1 each by Does not apply route daily. 1 each 0  ? Misc. Devices (WRIST BRACE) MISC 2 Units by Does not apply route. Wear on wrists as needed for support    ? ?No facility-administered medications prior to visit.  ?  ? ?Per HPI unless specifically indicated in ROS section below ?Review of Systems  ?Constitutional:  Negative for fatigue and fever.  ?HENT:  Negative for ear pain.   ?Eyes:  Negative for pain.  ?Respiratory:  Negative for cough and shortness of breath.   ?Cardiovascular:  Negative for chest pain,  palpitations and leg swelling.  ?Gastrointestinal:  Negative for abdominal pain.  ?Genitourinary:  Negative for dysuria.  ?Musculoskeletal:  Negative for arthralgias.  ?Neurological:  Negative for syncope, light-headedness and headaches.  ?Psychiatric/Behavioral:  Negative for dysphoric mood.   ?Objective:  ?BP (!) 90/58   Pulse (!) 51   Temp 98.3 ?F (36.8 ?C) (Oral)   Ht '5\' 7"'  (1.702 m)   Wt 293 lb 4 oz (133 kg)   SpO2 97%   BMI 45.93 kg/m?   ?Wt Readings from Last 3 Encounters:  ?10/11/21 293 lb 4 oz (133 kg)  ?09/22/21 289 lb (131.1 kg)  ?07/14/21 289 lb 1 oz (131.1 kg)  ?  ?  ?Physical Exam ?Constitutional:   ?  Appearance: He is well-developed. He is obese.  ?HENT:  ?   Head: Normocephalic.  ?   Right Ear: Hearing normal.  ?   Left Ear: Hearing normal.  ?   Nose: Nose normal.  ?Neck:  ?   Thyroid: No thyroid mass or thyromegaly.  ?   Vascular: No carotid bruit.  ?   Trachea: Trachea normal.  ?Cardiovascular:  ?   Rate and Rhythm: Normal rate and regular rhythm.  ?   Pulses: Normal pulses.  ?   Heart sounds: Heart sounds not distant. No murmur heard. ?  No friction rub. No gallop.  ?   Comments: No peripheral edema ?Pulmonary:  ?   Effort: Pulmonary effort is normal. No respiratory distress.  ?   Breath sounds: Normal breath sounds.  ?Skin: ?   General: Skin is warm and dry.  ?   Findings: No rash.  ?Psychiatric:     ?   Speech: Speech normal.     ?   Behavior: Behavior normal.     ?   Thought Content: Thought content normal.  ? ?Diabetic foot exam: ?Normal inspection ?No skin breakdown ?No calluses  ?Normal DP pulses ?Normal sensation to light touch and monofilament ?Nails normal ? ?   ?Results for orders placed or performed in visit on 10/11/21  ?POCT glycosylated hemoglobin (Hb A1C)  ?Result Value Ref Range  ? Hemoglobin A1C 6.7 (A) 4.0 - 5.6 %  ? HbA1c POC (<> result, manual entry)    ? HbA1c, POC (prediabetic range)    ? HbA1c, POC (controlled diabetic range)    ? ? ? ?COVID 19 screen:  No recent  travel or known exposure to [place] ?The patient denies respiratory symptoms of COVID 19 at this time. ?The importance of social distancing was discussed today.  ? ?Assessment and Plan ?Problem List Items Addressed This Visit   ? ? BMI

## 2021-10-11 NOTE — Assessment & Plan Note (Signed)
Morbid obesity contributing, using oxygen as needed. ?

## 2021-10-11 NOTE — Assessment & Plan Note (Signed)
Chronic, usually well controlled but at this office visit his blood pressure is on the lower side.  He does not eat any salt.  He reports moderate water intake.  I am not sure why he is on HCTZ as well as furosemide 40 mg daily.  We will stop HCTZ and he will increase water.  He will continue to follow blood pressure at home.  Continue metoprolol 25 mg p.o. twice daily ?

## 2021-10-11 NOTE — Assessment & Plan Note (Signed)
Chronic, stable ?Refilled albuterol which she states helped significantly when using as needed.  Continue Advair 250/50 1 puff twice daily ?

## 2021-10-11 NOTE — Assessment & Plan Note (Signed)
Chronic, slightly worsening control ?Addition weight gain as opposed to a loss ? ?Will increase Ozempic to 1 mg weekly.  Continue metformin at 1000 mg p.o. daily.  Continue working on dietary and lifestyle changes. ? ?

## 2021-10-11 NOTE — Assessment & Plan Note (Signed)
No red flags for misuse of oxycodone.  He will continue half tab of 5/325 mg oxycodone p.o. 1-2 times daily as needed pain. ?PDMP reviewed during this encounter. ? ?

## 2021-10-11 NOTE — Assessment & Plan Note (Signed)
Chronic, followed by Dr. Gery Pray cardiology ?Euvolemic possibly slightly dehydrated in office today.  Continue furosemide 40 mg p.o. daily.  Stop HCTZ as essentially duplicate and blood pressure on the low side ?

## 2021-10-11 NOTE — Patient Instructions (Addendum)
Work on [employer], low carb diet... stop po tarts ? Keep up with regular activity. ? Stop HCTZ given low blood pressure.Continue metoprolol and furosemide. Keep up with water intake. Call if BP remaining low. ? Increase Ozempic to 1 mg weekly. ? ?

## 2021-10-21 ENCOUNTER — Other Ambulatory Visit: Payer: Self-pay | Admitting: Family Medicine

## 2021-10-21 DIAGNOSIS — I10 Essential (primary) hypertension: Secondary | ICD-10-CM

## 2021-10-25 DIAGNOSIS — J449 Chronic obstructive pulmonary disease, unspecified: Secondary | ICD-10-CM | POA: Diagnosis not present

## 2021-11-10 DIAGNOSIS — G4733 Obstructive sleep apnea (adult) (pediatric): Secondary | ICD-10-CM | POA: Diagnosis not present

## 2021-11-25 ENCOUNTER — Telehealth: Payer: Self-pay | Admitting: Family Medicine

## 2021-11-25 DIAGNOSIS — E119 Type 2 diabetes mellitus without complications: Secondary | ICD-10-CM

## 2021-11-25 DIAGNOSIS — J449 Chronic obstructive pulmonary disease, unspecified: Secondary | ICD-10-CM | POA: Diagnosis not present

## 2021-11-25 DIAGNOSIS — E1142 Type 2 diabetes mellitus with diabetic polyneuropathy: Secondary | ICD-10-CM

## 2021-11-25 NOTE — Telephone Encounter (Signed)
Last office visit 10/11/2021 for DM and Pain Management.   Last refilled 06/20/21 for #270 with no refills.  CPE 01/13/22.

## 2021-11-28 ENCOUNTER — Ambulatory Visit (INDEPENDENT_AMBULATORY_CARE_PROVIDER_SITE_OTHER): Payer: Medicare Other | Admitting: Pulmonary Disease

## 2021-11-28 ENCOUNTER — Encounter: Payer: Self-pay | Admitting: Pulmonary Disease

## 2021-11-28 VITALS — BP 120/74 | HR 60 | Ht 66.0 in | Wt 297.0 lb

## 2021-11-28 DIAGNOSIS — J4540 Moderate persistent asthma, uncomplicated: Secondary | ICD-10-CM | POA: Diagnosis not present

## 2021-11-28 DIAGNOSIS — E662 Morbid (severe) obesity with alveolar hypoventilation: Secondary | ICD-10-CM

## 2021-11-28 DIAGNOSIS — J9611 Chronic respiratory failure with hypoxia: Secondary | ICD-10-CM | POA: Diagnosis not present

## 2021-11-28 DIAGNOSIS — J9612 Chronic respiratory failure with hypercapnia: Secondary | ICD-10-CM | POA: Diagnosis not present

## 2021-11-28 DIAGNOSIS — G4733 Obstructive sleep apnea (adult) (pediatric): Secondary | ICD-10-CM

## 2021-11-28 MED ORDER — METFORMIN HCL 1000 MG PO TABS: 1000.0000 mg | ORAL_TABLET | Freq: Two times a day (BID) | ORAL | 0 refills | Status: DC

## 2021-11-28 NOTE — Telephone Encounter (Signed)
Patient called because he is out of town and he said he forgot his ozempic at home in his fridge and said that he can just take extra metformin until he gets back and that his daughter can pick it up and bring it when she goes to meet him out of town. The pharmacy wont refill right now because its to soon so he asked if the script can be increased to 2 a day for now until his appointment in August so she can pick up. She said she cant bring his ozempic because its lock in his house and she doesn't have a key. Please advise. Call back is [phone].

## 2021-11-28 NOTE — Telephone Encounter (Signed)
Sent in prescription for temporary increase in metformin to 2 times daily.

## 2021-11-28 NOTE — Progress Notes (Signed)
[facility]  Chief Complaint  Patient presents with   Follow-up    He is doing well with his BiPAP, and is doing well with his breathing.     Constitutional:  BP 120/74 (BP Location: Left Arm, Cuff Size: Large)   Pulse 60   Ht _0  (1.676 m)   Wt 297 lb (134.7 kg)   SpO2 98%   BMI 47.94 kg/m   Past Medical History:  BPH, Depression, DM, HTN, Fibromyalgia, HLD, COVID 19 infection April 2022  Past Surgical History:  He  has no past surgical history on file.  Brief Summary:  Jonathon Graham is a 66 y.o. male former smoker with obstructive sleep apnea, obesity hypoventilation syndrome, and asthmatic bronchitis.        Subjective:   His weight at last visit in July 2022 was 286 lbs.  He was as high as 360 lbs.  He was started on ozempic about 3 months ago, and feels this is helping with weight loss.  He has also modified his diet.  He has his Bipap from 2019 and his sisters Bipap.  He uses his sisters more often.  No issues with mask fit.  Breathing okay.  Not having cough, wheeze, or sputum.  Avoids going outside in hot weather.  Not needing albuterol much.  Physical Exam:   Appearance - well kempt, wearing oxygen  ENMT - no sinus tenderness, no oral exudate, no LAN, Mallampati 3 airway, no stridor  Respiratory - equal breath sounds bilaterally, no wheezing or rales  CV - s1s2 regular rate and rhythm, no murmurs  Ext - no clubbing, no edema  Skin - no rashes  Psych - normal mood and affect    Pulmonary testing:  ABG 02/01/17 >> pH 7.39, PCO2 53, PO2 52 PFT 12/20/17 >> FEV1 2.22 (68%), FEV1% 79, TLC 4.81 (78%), DLCO 54%  Chest Imaging:  CT angio chest 03/24/17 >> CM, ascending aorta 4.5 cm, mild dilation PA, b/l upper lobe GGO likely infectious  Sleep Tests:  PSG 04/25/13 >> AHI 101.4, SpO2 low 57.4% PSG 07/24/17 >> AHI 139.7, SpO2 low 55.4%  Cardiac Tests:  Echo 06/06/21 >> EF 45%, mild LVH, grade 1 DD, aortic root  41 mm  Social History:  He  reports that he quit smoking about 4 years ago. His smoking use included cigarettes. He has never used smokeless tobacco. He reports that he does not drink alcohol and does not use drugs.  Family History:  His family history includes Cancer (age of onset: 51) in his sister; Diabetes Mellitus II in his brother and sister; Fibrocystic breast disease in his sister; Heart disease in his father and mother; Hypertension in his sister; Myasthenia gravis in his sister.     Assessment/Plan:   Obstructive sleep apnea. - he is compliant with BiPAP and reports benefit from therapy - he has a second Bipap he got from his sister, and uses this one more often - uses [employer] for his DME - current Bipap was ordered in March 2019 - continue auto Bipap with max IPAP 25, min EPAP 16, PS 5 cm H2O - he will drop off the SD card from his sister's Bipap  Chronic respiratory failure with hypoxia and hypercapnia from obesity hypoventilation syndrome. - 2 liters oxygen 24/7 - goal SpO2 > 90%   Obesity. - started on ozempic  - modified his diet  Moderate, persistent asthma. - continue advair 250 one puff bid - prn albuterol  Chronic combined CHF, Coronary artery disease, Hypertension, Hyperlipidemia. - followed by Dr. Ancil Linsey with cardiology  Time Spent Involved in Patient Care on Day of Examination:  36 minutes  Follow up:   Patient Instructions  Drop off the SD card from your Bipap machine when you can and we will let you know what the report shows  Follow up in 1 year  Medication List:   Allergies as of 11/28/2021       Reactions   Penicillins Anaphylaxis, Swelling        Medication List        Accurate as of November 28, 2021  9:52 AM. If you have any questions, ask your nurse or doctor.          albuterol (2.5 MG/3ML) 0.083% nebulizer solution Commonly known as: PROVENTIL Take 3 mLs (2.5 mg total) by nebulization every 6 (six) hours as needed for  wheezing or shortness of breath.   albuterol 108 (90 Base) MCG/ACT inhaler Commonly known as: ProAir HFA Inhale 1-2 puffs into the lungs every 4 (four) hours as needed for wheezing or shortness of breath.   aspirin 81 MG tablet Take 243 mg by mouth daily. Takes 4 90m aspirin daily.   atorvastatin 40 MG tablet Commonly known as: LIPITOR Take 1 tablet by mouth once daily   betamethasone valerate ointment 0.1 % Commonly known as: VALISONE Apply 1 application topically 2 (two) times daily. For up to 10 days. What changed:  when to take this reasons to take this additional instructions   blood glucose meter kit and supplies Kit Dispense based on patient and insurance preference. Use up to four times daily as directed. (FOR ICD-9 250.00, 250.01).   clotrimazole 1 % cream Commonly known as: LOTRIMIN Apply 1 application 2 (two) times daily topically. For 7 days What changed:  when to take this reasons to take this additional instructions   cyclobenzaprine 10 MG tablet Commonly known as: FLEXERIL Take 1 tablet (10 mg total) by mouth at bedtime as needed for muscle spasms.   DULoxetine 60 MG capsule Commonly known as: Cymbalta Take 1 capsule (60 mg total) by mouth daily.   fluticasone-salmeterol 250-50 MCG/ACT Aepb Commonly known as: ADVAIR Inhale 1 puff into the lungs every 12 (twelve) hours.   FreeStyle Libre 3 Sensor Misc 1 each by Does not apply route every 14 (fourteen) days. Place 1 sensor on the skin every 14 days. Use to check glucose continuously   furosemide 40 MG tablet Commonly known as: LASIX TAKE 1 TABLET BY MOUTH ONCE DAILY AS NEEDED   gabapentin 300 MG capsule Commonly known as: NEURONTIN TAKE 1 CAPSULE BY MOUTH THREE TIMES DAILY   ibuprofen 800 MG tablet Commonly known as: ADVIL Take 1 tablet (800 mg total) by mouth every 8 (eight) hours as needed.   metFORMIN 1000 MG tablet Commonly known as: GLUCOPHAGE Take 1 tablet by mouth once daily    metoprolol tartrate 25 MG tablet Commonly known as: LOPRESSOR Take 1 tablet (25 mg total) by mouth 2 (two) times daily.   ondansetron 8 MG tablet Commonly known as: ZOFRAN Take 1 tablet (8 mg total) by mouth every 8 (eight) hours as needed for nausea or vomiting.   oxyCODONE-acetaminophen 5-325 MG tablet Commonly known as: PERCOCET/ROXICET Take 0.5 tablets by mouth 2 (two) times daily as needed for severe pain.   potassium chloride SA 20 MEQ tablet Commonly known as: KLOR-CON M Take 1 tablet (20 mEq total) by mouth daily. What changed:  when to take this   Semaglutide (1 MG/DOSE) 4 MG/3ML Sopn Inject 1 mg as directed once a week.   tamsulosin 0.4 MG Caps capsule Commonly known as: FLOMAX Take 1 capsule (0.4 mg total) by mouth daily.   Vitamin D3 1.25 MG (50000 UT) Caps TAKE 1 CAPSULE BY MOUTH  ONCE A WEEK        Signature:  Chesley Mires, MD [place] Pager - [phone] 11/28/2021, 9:52 AM

## 2021-11-28 NOTE — Telephone Encounter (Signed)
Mr. Lortie notified that Metformin Rx he requested has been sent to [facility] on [address]

## 2021-11-28 NOTE — Patient Instructions (Signed)
Drop off the SD card from your Bipap machine when you can and we will let you know what the report shows  Follow up in 1 year

## 2021-12-20 ENCOUNTER — Other Ambulatory Visit: Payer: Self-pay | Admitting: Family Medicine

## 2021-12-20 DIAGNOSIS — E119 Type 2 diabetes mellitus without complications: Secondary | ICD-10-CM

## 2021-12-21 ENCOUNTER — Other Ambulatory Visit: Payer: Self-pay | Admitting: Family Medicine

## 2021-12-21 DIAGNOSIS — N401 Enlarged prostate with lower urinary tract symptoms: Secondary | ICD-10-CM

## 2021-12-25 ENCOUNTER — Other Ambulatory Visit: Payer: Self-pay | Admitting: Family Medicine

## 2021-12-25 DIAGNOSIS — J449 Chronic obstructive pulmonary disease, unspecified: Secondary | ICD-10-CM | POA: Diagnosis not present

## 2022-01-03 ENCOUNTER — Other Ambulatory Visit: Payer: Self-pay | Admitting: Family Medicine

## 2022-01-03 DIAGNOSIS — M48061 Spinal stenosis, lumbar region without neurogenic claudication: Secondary | ICD-10-CM

## 2022-01-03 DIAGNOSIS — M25552 Pain in left hip: Secondary | ICD-10-CM

## 2022-01-03 NOTE — Telephone Encounter (Signed)
Will refill pain med at upcoming OV

## 2022-01-03 NOTE — Telephone Encounter (Signed)
  Encourage patient to contact the pharmacy for refills or they can request refills through [facility]  LAST APPOINTMENT DATE:  Please schedule appointment if longer than 1 year  NEXT APPOINTMENT DATE:  MEDICATION:oxyCODONE-acetaminophen (PERCOCET/ROXICET) 5-325 MG tablet  Is the patient out of medication?   PHARMACY: [facility] [address], [place] - [address]       Let patient know to contact pharmacy at the end of the day to make sure medication is ready.  Please notify patient to allow 48-72 hours to process  CLINICAL FILLS OUT ALL BELOW:   LAST REFILL:  QTY:  REFILL DATE:    OTHER COMMENTS:    Okay for refill?  Please advise

## 2022-01-03 NOTE — Telephone Encounter (Signed)
Name of Medication: Oxycodone Name of Pharmacy: [facility] Last Fill or Written Date and Quantity: 10/11/21 #30 Last Office Visit and Type: 10/11/21 Next Office Visit and Type: 01/13/22 Last Controlled Substance Agreement Date: 02/25/21 Last UDS:02/18/21

## 2022-01-04 ENCOUNTER — Telehealth: Payer: Self-pay | Admitting: Family Medicine

## 2022-01-04 DIAGNOSIS — Z125 Encounter for screening for malignant neoplasm of prostate: Secondary | ICD-10-CM

## 2022-01-04 DIAGNOSIS — E1142 Type 2 diabetes mellitus with diabetic polyneuropathy: Secondary | ICD-10-CM

## 2022-01-04 NOTE — Telephone Encounter (Signed)
-----   Message from Alvina Chou sent at 12/23/2021  3:35 PM EDT ----- Regarding: Lab orders for Thursday, 8.10.23 Patient is scheduled for CPX labs, please order future labs, Thanks , Camelia Eng

## 2022-01-05 ENCOUNTER — Other Ambulatory Visit (INDEPENDENT_AMBULATORY_CARE_PROVIDER_SITE_OTHER): Payer: Medicare Other

## 2022-01-05 DIAGNOSIS — Z125 Encounter for screening for malignant neoplasm of prostate: Secondary | ICD-10-CM | POA: Diagnosis not present

## 2022-01-05 DIAGNOSIS — E1142 Type 2 diabetes mellitus with diabetic polyneuropathy: Secondary | ICD-10-CM | POA: Diagnosis not present

## 2022-01-05 LAB — MICROALBUMIN / CREATININE URINE RATIO
Creatinine,U: 211.6 mg/dL
Microalb Creat Ratio: 0.8 mg/g (ref 0.0–30.0)
Microalb, Ur: 1.7 mg/dL (ref 0.0–1.9)

## 2022-01-05 LAB — LIPID PANEL
Cholesterol: 104 mg/dL (ref 0–200)
HDL: 42.3 mg/dL (ref >39.00)
LDL Cholesterol: 49 mg/dL (ref 0–99)
NonHDL: 61.36
Total CHOL/HDL Ratio: 2
Triglycerides: 63 mg/dL (ref 0.0–149.0)
VLDL: 12.6 mg/dL (ref 0.0–40.0)

## 2022-01-05 LAB — COMPREHENSIVE METABOLIC PANEL
ALT: 15 U/L (ref 0–53)
AST: 17 U/L (ref 0–37)
Albumin: 4.3 g/dL (ref 3.5–5.2)
Alkaline Phosphatase: 57 U/L (ref 39–117)
BUN: 23 mg/dL (ref 6–23)
CO2: 30 mEq/L (ref 19–32)
Calcium: 9.4 mg/dL (ref 8.4–10.5)
Chloride: 97 mEq/L (ref 96–112)
Creatinine, Ser: 1.03 mg/dL (ref 0.40–1.50)
GFR: 77.23 mL/min (ref >60.00)
Glucose, Bld: 157 mg/dL — ABNORMAL HIGH (ref 70–99)
Potassium: 4 mEq/L (ref 3.5–5.1)
Sodium: 141 mEq/L (ref 135–145)
Total Bilirubin: 0.9 mg/dL (ref 0.2–1.2)
Total Protein: 6.9 g/dL (ref 6.0–8.3)

## 2022-01-05 LAB — HEMOGLOBIN A1C: Hgb A1c MFr Bld: 7 % — ABNORMAL HIGH (ref 4.6–6.5)

## 2022-01-05 LAB — PSA: PSA: 0.22 ng/mL (ref 0.10–4.00)

## 2022-01-06 NOTE — Progress Notes (Signed)
No critical labs need to be addressed urgently. We will discuss labs in detail at upcoming office visit.   

## 2022-01-10 ENCOUNTER — Telehealth: Payer: Self-pay | Admitting: Family Medicine

## 2022-01-10 NOTE — Telephone Encounter (Signed)
LAST OV 10/11/21 LAST FILLED 10/11/21 NEXT OV 01/13/22

## 2022-01-10 NOTE — Telephone Encounter (Signed)
Given this is a controlled substance he needs appointment for chronic pain every 3 months.  Prescriptions ideally should be made in person at the visit.  The medication should last from appointment to appointment, if it is not we can discuss increasing the dosage or amount given at the appointment.  He has this appointment already made on August 18.  I will refill the medication at that time. Please let the patient know

## 2022-01-10 NOTE — Telephone Encounter (Signed)
  Encourage patient to contact the pharmacy for refills or they can request refills through [facility]  LAST APPOINTMENT DATE:  Please schedule appointment if longer than 1 year  NEXT APPOINTMENT DATE:  MEDICATION:oxyCODONE-acetaminophen (PERCOCET/ROXICET) 5-325 MG tablet  Is the patient out of medication?   PHARMACY:[facility] - [address], [place]  Let patient know to contact pharmacy at the end of the day to make sure medication is ready.  Please notify patient to allow 48-72 hours to process  CLINICAL FILLS OUT ALL BELOW:   LAST REFILL:  QTY:  REFILL DATE:    OTHER COMMENTS:    Okay for refill?  Please advise

## 2022-01-11 NOTE — Telephone Encounter (Signed)
Patient notified as instructed by telephone and verbalized understanding. 

## 2022-01-13 ENCOUNTER — Ambulatory Visit (INDEPENDENT_AMBULATORY_CARE_PROVIDER_SITE_OTHER): Payer: Medicare Other | Admitting: Family Medicine

## 2022-01-13 ENCOUNTER — Encounter: Payer: Self-pay | Admitting: Family Medicine

## 2022-01-13 VITALS — BP 116/70 | HR 72 | Temp 98.2°F | Ht 67.0 in | Wt 283.4 lb

## 2022-01-13 DIAGNOSIS — E785 Hyperlipidemia, unspecified: Secondary | ICD-10-CM

## 2022-01-13 DIAGNOSIS — I5020 Unspecified systolic (congestive) heart failure: Secondary | ICD-10-CM | POA: Diagnosis not present

## 2022-01-13 DIAGNOSIS — E1159 Type 2 diabetes mellitus with other circulatory complications: Secondary | ICD-10-CM

## 2022-01-13 DIAGNOSIS — E662 Morbid (severe) obesity with alveolar hypoventilation: Secondary | ICD-10-CM

## 2022-01-13 DIAGNOSIS — I152 Hypertension secondary to endocrine disorders: Secondary | ICD-10-CM

## 2022-01-13 DIAGNOSIS — G894 Chronic pain syndrome: Secondary | ICD-10-CM | POA: Diagnosis not present

## 2022-01-13 DIAGNOSIS — E1149 Type 2 diabetes mellitus with other diabetic neurological complication: Secondary | ICD-10-CM

## 2022-01-13 DIAGNOSIS — E1142 Type 2 diabetes mellitus with diabetic polyneuropathy: Secondary | ICD-10-CM

## 2022-01-13 DIAGNOSIS — Z0000 Encounter for general adult medical examination without abnormal findings: Secondary | ICD-10-CM | POA: Diagnosis not present

## 2022-01-13 DIAGNOSIS — M48061 Spinal stenosis, lumbar region without neurogenic claudication: Secondary | ICD-10-CM

## 2022-01-13 DIAGNOSIS — E1169 Type 2 diabetes mellitus with other specified complication: Secondary | ICD-10-CM

## 2022-01-13 DIAGNOSIS — J439 Emphysema, unspecified: Secondary | ICD-10-CM | POA: Diagnosis not present

## 2022-01-13 DIAGNOSIS — F322 Major depressive disorder, single episode, severe without psychotic features: Secondary | ICD-10-CM

## 2022-01-13 DIAGNOSIS — M25552 Pain in left hip: Secondary | ICD-10-CM

## 2022-01-13 DIAGNOSIS — Z6841 Body Mass Index (BMI) 40.0 and over, adult: Secondary | ICD-10-CM

## 2022-01-13 MED ORDER — OXYCODONE-ACETAMINOPHEN 5-325 MG PO TABS: 0.5000 | ORAL_TABLET | Freq: Two times a day (BID) | ORAL | 0 refills | Status: DC | PRN

## 2022-01-13 NOTE — Assessment & Plan Note (Signed)
Chronic, followed by Dr. Gery Pray cardiology  Euvolemic in office today using 40 mg p.o. daily as needed

## 2022-01-13 NOTE — Assessment & Plan Note (Signed)
Stable, chronic.  Continue current medication.   at goal < 70 on atorvastatin 40 mg p.o. daily

## 2022-01-13 NOTE — Assessment & Plan Note (Signed)
Stable, chronic.  Continue current medication.   Cymbalta 30 mg p.o. daily

## 2022-01-13 NOTE — Patient Instructions (Addendum)
Set up yearly eye exam for diabetes and have the opthalmologist send [place] a copy of the evaluation for the chart.  Continue working on healthy eating and regular exercise as able.  Continue Ozempic 1 mg weekly and metformin at current dose... call if blood sugar frequently dropping low. Make sure to NOT to skip meals.

## 2022-01-13 NOTE — Progress Notes (Signed)
Patient ID: Jonathon Graham, male    DOB: May 08, 1956, 66 y.o.   MRN: [id number]  This visit was conducted in person.  BP 116/70 (BP Location: Left Arm, Patient Position: Sitting)   Pulse 72   Temp 98.2 F (36.8 C) (Oral)   Ht '5\' 7"'  (1.702 m)   Wt 283 lb 6 oz (128.5 kg)   SpO2 95%   BMI 44.38 kg/m    CC:  Chief Complaint  Patient presents with   Annual Exam    Subjective:   HPI: Jonathon Graham is a 66 y.o. male presenting on 01/13/2022 for Annual Exam  The patient presents for  complete physical and review of chronic health problems. He/She also has the following acute concerns today: none  The patient saw a LPN or RN for medicare wellness visit.  Prevention and wellness was reviewed in detail. Note reviewed and important notes copied below.  Diabetes: Well-controlled on metformin 1000 mg p.o. daily and Ozempic 1 mg weekly Lab Results  Component Value Date   HGBA1C 7.0 (H) 01/05/2022  Using medications without difficulties: Hypoglycemic episodes:  skipped meal had a 52 Hyperglycemic episodes:none Feet problems: Associated with neuropathy Blood Sugars averaging: FBS 84-100 eye exam within last year: due  Wt Readings from Last 3 Encounters:  01/13/22 283 lb 6 oz (128.5 kg)  11/28/21 297 lb (134.7 kg)  10/11/21 293 lb 4 oz (133 kg)  Body mass index is 44.38 kg/m. He has continued to have weight loss on higher dose of Ozempic at 1 mg weekly.  He has lost 14 pounds in the last month and a half   Elevated Cholesterol: Cholesterol at goal < 70 on atorvastatin 40 mg p.o. daily. Lab Results  Component Value Date   CHOL 104 01/05/2022   HDL 42.30 01/05/2022   LDLCALC 49 01/05/2022   TRIG 63.0 01/05/2022   CHOLHDL 2 01/05/2022  Using medications without problems: none Muscle aches: none Diet compliance: good Exercise: more active over the summer Other complaints:   HFrEF followed by cardiology.  Hypertension:  Well controlled on metoprolol 25 mg p.o. BID BP  Readings from Last 3 Encounters:  01/13/22 116/70  11/28/21 120/74  10/11/21 (!) 90/58  Using medication without problems or lightheadedness:  none Chest pain with exertion: none Edema: none Short of breath: none Average home BPs: Other issues:   MDD: Well controlled on cymbalta 60 mg daily Flowsheet Row Clinical Support from 09/22/2021 in [place] at [facility]  PHQ-2 Total Score 0         COPD: Chronic, stable  Using albuterol as needed and Advair 250/151 puff twice daily as controller  Indication for chronic opioid: Chronic low back pain, spinal stenosis of lumbar back, osteoarthritis and shoulder and hip Medication and dose: Oxycodone/acetaminophen 5/325 mg half a tablet daily as needed # pills per month: 30 tablets in 3 months.. he ran out early given he fell in last month.. to 1 tab each day for 5-6 days. Last UDS date: 02/18/2021 Opioid Treatment Agreement signed (Y/N): Yes Opioid Treatment Agreement last reviewed with patient:   NCCSRS reviewed this encounter (include red flags):   PDMP reviewed during this encounter.   Relevant past medical, surgical, family and social history reviewed and updated as indicated. Interim medical history since our last visit reviewed. Allergies and medications reviewed and updated. Outpatient Medications Prior to Visit  Medication Sig Dispense Refill   albuterol (PROAIR HFA) 108 (90 Base) MCG/ACT inhaler Inhale 1-2 puffs into the  lungs every 4 (four) hours as needed for wheezing or shortness of breath. 1 each 5   albuterol (PROVENTIL) (2.5 MG/3ML) 0.083% nebulizer solution Take 3 mLs (2.5 mg total) by nebulization every 6 (six) hours as needed for wheezing or shortness of breath. 360 mL 6   aspirin 81 MG tablet Take 243 mg by mouth daily. Takes 4 37m aspirin daily.     atorvastatin (LIPITOR) 40 MG tablet Take 1 tablet by mouth once daily 90 tablet 3   betamethasone valerate ointment (VALISONE) 0.1 % Apply 1 application  topically 2 (two) times daily. For up to 10 days. (Patient taking differently: Apply 1 application  topically as needed.) 30 g 0   blood glucose meter kit and supplies KIT Dispense based on patient and insurance preference. Use up to four times daily as directed. (FOR ICD-9 250.00, 250.01). 1 each 0   Cholecalciferol (VITAMIN D3) 1.25 MG (50000 UT) CAPS TAKE 1 CAPSULE BY MOUTH  ONCE A WEEK 12 capsule 0   clotrimazole (LOTRIMIN) 1 % cream Apply 1 application 2 (two) times daily topically. For 7 days (Patient taking differently: Apply 1 application  topically as needed.) 28 g 0   Continuous Blood Gluc Sensor (FREESTYLE LIBRE 3 SENSOR) MISC 1 each by Does not apply route every 14 (fourteen) days. Place 1 sensor on the skin every 14 days. Use to check glucose continuously 2 each 5   cyclobenzaprine (FLEXERIL) 10 MG tablet Take 1 tablet (10 mg total) by mouth at bedtime as needed for muscle spasms. 15 tablet 0   DULoxetine (CYMBALTA) 60 MG capsule Take 1 capsule (60 mg total) by mouth daily. 90 capsule 1   fluticasone-salmeterol (ADVAIR) 250-50 MCG/ACT AEPB Inhale 1 puff into the lungs every 12 (twelve) hours. 60 each 11   furosemide (LASIX) 40 MG tablet TAKE 1 TABLET BY MOUTH ONCE DAILY AS NEEDED 90 tablet 1   gabapentin (NEURONTIN) 300 MG capsule TAKE 1 CAPSULE BY MOUTH THREE TIMES DAILY 270 capsule 0   ibuprofen (ADVIL) 800 MG tablet Take 1 tablet (800 mg total) by mouth every 8 (eight) hours as needed. 60 tablet 0   metFORMIN (GLUCOPHAGE) 1000 MG tablet Take 1 tablet (1,000 mg total) by mouth 2 (two) times daily with a meal. 60 tablet 0   metoprolol tartrate (LOPRESSOR) 25 MG tablet Take 1 tablet (25 mg total) by mouth 2 (two) times daily. 180 tablet 4   ondansetron (ZOFRAN) 8 MG tablet Take 1 tablet (8 mg total) by mouth every 8 (eight) hours as needed for nausea or vomiting. 12 tablet 0   oxyCODONE-acetaminophen (PERCOCET/ROXICET) 5-325 MG tablet Take 0.5 tablets by mouth 2 (two) times daily as needed  for severe pain. 30 tablet 0   potassium chloride SA (KLOR-CON) 20 MEQ tablet Take 1 tablet (20 mEq total) by mouth daily. (Patient taking differently: Take 20 mEq by mouth once a week.) 90 tablet 0   Semaglutide, 1 MG/DOSE, 4 MG/3ML SOPN Inject 1 mg as directed once a week. 3 mL 11   tamsulosin (FLOMAX) 0.4 MG CAPS capsule Take 1 capsule by mouth once daily 90 capsule 3   No facility-administered medications prior to visit.     Per HPI unless specifically indicated in ROS section below Review of Systems  Constitutional:  Negative for fatigue and fever.  HENT:  Negative for ear pain.   Eyes:  Negative for pain.  Respiratory:  Negative for cough and shortness of breath.   Cardiovascular:  Negative for chest  pain, palpitations and leg swelling.  Gastrointestinal:  Negative for abdominal pain.  Genitourinary:  Negative for dysuria.  Musculoskeletal:  Positive for arthralgias, back pain and neck pain.  Neurological:  Negative for syncope, light-headedness and headaches.  Psychiatric/Behavioral:  Negative for dysphoric mood.    Objective:  BP 116/70 (BP Location: Left Arm, Patient Position: Sitting)   Pulse 72   Temp 98.2 F (36.8 C) (Oral)   Ht '5\' 7"'  (1.702 m)   Wt 283 lb 6 oz (128.5 kg)   SpO2 95%   BMI 44.38 kg/m   Wt Readings from Last 3 Encounters:  01/13/22 283 lb 6 oz (128.5 kg)  11/28/21 297 lb (134.7 kg)  10/11/21 293 lb 4 oz (133 kg)      Physical Exam Constitutional:      General: He is not in acute distress.    Appearance: Normal appearance. He is well-developed. He is obese. He is not ill-appearing or toxic-appearing.  HENT:     Head: Normocephalic and atraumatic.     Right Ear: Hearing, tympanic membrane, ear canal and external ear normal.     Left Ear: Hearing, tympanic membrane, ear canal and external ear normal.     Nose: Nose normal.     Mouth/Throat:     Pharynx: Uvula midline.  Eyes:     General: Lids are normal. Lids are everted, no foreign bodies  appreciated.     Conjunctiva/sclera: Conjunctivae normal.     Pupils: Pupils are equal, round, and reactive to light.  Neck:     Thyroid: No thyroid mass or thyromegaly.     Vascular: No carotid bruit.     Trachea: Trachea and phonation normal.  Cardiovascular:     Rate and Rhythm: Normal rate and regular rhythm.     Pulses: Normal pulses.     Heart sounds: S1 normal and S2 normal. No murmur heard.    No gallop.  Pulmonary:     Breath sounds: Normal breath sounds. No wheezing, rhonchi or rales.  Abdominal:     General: Bowel sounds are normal.     Palpations: Abdomen is soft.     Tenderness: There is no abdominal tenderness. There is no guarding or rebound.     Hernia: No hernia is present.  Musculoskeletal:     Cervical back: Normal range of motion and neck supple.  Lymphadenopathy:     Cervical: No cervical adenopathy.  Skin:    General: Skin is warm and dry.     Findings: No rash.  Neurological:     Mental Status: He is alert.     Cranial Nerves: No cranial nerve deficit.     Sensory: No sensory deficit.     Gait: Gait normal.     Deep Tendon Reflexes: Reflexes are normal and symmetric.  Psychiatric:        Speech: Speech normal.        Behavior: Behavior normal.        Judgment: Judgment normal.       Results for orders placed or performed in visit on 01/05/22  Microalbumin / creatinine urine ratio  Result Value Ref Range   Microalb, Ur 1.7 0.0 - 1.9 mg/dL   Creatinine,U 211.6 mg/dL   Microalb Creat Ratio 0.8 0.0 - 30.0 mg/g  Hemoglobin A1c  Result Value Ref Range   Hgb A1c MFr Bld 7.0 (H) 4.6 - 6.5 %  Lipid panel  Result Value Ref Range   Cholesterol 104 0 - 200 mg/dL  Triglycerides 63.0 0.0 - 149.0 mg/dL   HDL 42.30 >39.00 mg/dL   VLDL 12.6 0.0 - 40.0 mg/dL   LDL Cholesterol 49 0 - 99 mg/dL   Total CHOL/HDL Ratio 2    NonHDL 61.36   PSA  Result Value Ref Range   PSA 0.22 0.10 - 4.00 ng/mL  Comprehensive metabolic panel  Result Value Ref Range    Sodium 141 135 - 145 mEq/L   Potassium 4.0 3.5 - 5.1 mEq/L   Chloride 97 96 - 112 mEq/L   CO2 30 19 - 32 mEq/L   Glucose, Bld 157 (H) 70 - 99 mg/dL   BUN 23 6 - 23 mg/dL   Creatinine, Ser 1.03 0.40 - 1.50 mg/dL   Total Bilirubin 0.9 0.2 - 1.2 mg/dL   Alkaline Phosphatase 57 39 - 117 U/L   AST 17 0 - 37 U/L   ALT 15 0 - 53 U/L   Total Protein 6.9 6.0 - 8.3 g/dL   Albumin 4.3 3.5 - 5.2 g/dL   GFR 77.23 >60.00 mL/min   Calcium 9.4 8.4 - 10.5 mg/dL     COVID 19 screen:  No recent travel or known exposure to COVID19 The patient denies respiratory symptoms of COVID 19 at this time. The importance of social distancing was discussed today.   Assessment and Plan The patient's preventative maintenance and recommended screening tests for an annual wellness exam were reviewed in full today. Brought up to date unless services declined.  Counselled on the importance of diet, exercise, and its role in overall health and mortality. The patient's FH and SH was reviewed, including their home life, tobacco status, and drug and alcohol status.   Vaccines: Uptodate with PNA,  Discussed COVID19 vaccine side effects and benefits. Strongly encouraged the patient to get the vaccine. Questions answered. Due for tdap and shingrix... he thinks he may have had td in 2015. Prostate Cancer Screen:  Lab Results  Component Value Date   PSA 0.22 01/05/2022   PSA 0.24 10/19/2020  Colon Cancer Screen:  Cologuard 10/2020 repeat in 3 years      Smoking Status: former ETOH/ drug use: none/none  Hep C:  negative   Problem List Items Addressed This Visit     BMI 45.0-49.9, adult ([place]) (Chronic)    He has continued to have weight loss on higher dose of Ozempic at 1 mg weekly.  He has lost 14 pounds in the last month and a half      COPD (chronic obstructive pulmonary disease) (HCC) (Chronic)    Chronic, stable  Using albuterol as needed and Advair 250/151 puff twice daily as controller      Depression,  major, single episode, severe (HCC) (Chronic)    Stable, chronic.  Continue current medication.   Cymbalta 30 mg p.o. daily      Diabetic neuropathy (HCC) (Chronic)    Stable, tolerable control on gabapentin      HFrEF (heart failure with reduced ejection fraction) (HCC) (Chronic)    Chronic, followed by Dr. Alvester Chou cardiology  Euvolemic in office today using 40 mg p.o. daily as needed      Hyperlipidemia associated with type 2 diabetes mellitus (HCC) (Chronic)    Stable, chronic.  Continue current medication.   at goal < 70 on atorvastatin 40 mg p.o. daily      Hypertension associated with diabetes (HCC) (Chronic)    Stable, chronic.  Continue current medication.   Metoprolol 25 mg p.o. twice daily Using Lasix  40 mg as needed.      Obesity hypoventilation syndrome (HCC) (Chronic)    Working on weight loss with Ozempic      Type 2 diabetes, controlled, with peripheral neuropathy (HCC) (Chronic)   Chronic pain syndrome   Relevant Medications   oxyCODONE-acetaminophen (PERCOCET/ROXICET) 5-325 MG tablet   Left hip pain   Relevant Medications   oxyCODONE-acetaminophen (PERCOCET/ROXICET) 5-325 MG tablet   Spinal stenosis of lumbar region   Relevant Medications   oxyCODONE-acetaminophen (PERCOCET/ROXICET) 5-325 MG tablet   Other Visit Diagnoses     Routine general medical examination at a health care facility    -  Primary        Eliezer Lofts, MD

## 2022-01-13 NOTE — Assessment & Plan Note (Signed)
Working on weight loss with Ozempic

## 2022-01-13 NOTE — Assessment & Plan Note (Signed)
He has continued to have weight loss on higher dose of Ozempic at 1 mg weekly.  He has lost 14 pounds in the last month and a half

## 2022-01-13 NOTE — Assessment & Plan Note (Signed)
Chronic, stable  Using albuterol as needed and Advair 250/151 puff twice daily as controller

## 2022-01-13 NOTE — Assessment & Plan Note (Signed)
Stable, tolerable control on gabapentin

## 2022-01-13 NOTE — Assessment & Plan Note (Signed)
Stable, chronic.  Continue current medication.   Metoprolol 25 mg p.o. twice daily Using Lasix 40 mg as needed.

## 2022-01-25 DIAGNOSIS — J449 Chronic obstructive pulmonary disease, unspecified: Secondary | ICD-10-CM | POA: Diagnosis not present

## 2022-01-26 NOTE — Progress Notes (Signed)
[facility] Quality Team Note  Name: Jonathon Graham Date of Birth: [birth year] MRN: [id number] Date: 01/26/2022  [facility] Quality Team has reviewed this patient's chart, please see recommendations below:  [facility] Quality Other; Pt has quality gap for EED, needs Diabetic Retinal Screening to close this. There will be a Free Retinal Screening event at [facility] on 02/08/2022 and 04/12/2022. Please email [facility] Quality Coordinator if patient is interested in scheduling.

## 2022-02-01 DIAGNOSIS — H2513 Age-related nuclear cataract, bilateral: Secondary | ICD-10-CM | POA: Diagnosis not present

## 2022-02-01 LAB — HM DIABETES EYE EXAM

## 2022-02-08 ENCOUNTER — Telehealth: Payer: Self-pay | Admitting: Family Medicine

## 2022-02-08 ENCOUNTER — Encounter: Payer: Self-pay | Admitting: Family Medicine

## 2022-02-08 NOTE — Telephone Encounter (Signed)
[facility] sent over a fax with multiple patient results. Placed this result in Dr. Ermalene Searing box. Thank you!

## 2022-02-08 NOTE — Telephone Encounter (Addendum)
Diabetic Eye Exam abstracted and placed in Dr. Daphine Deutscher office in box for signature.

## 2022-02-14 DIAGNOSIS — G4733 Obstructive sleep apnea (adult) (pediatric): Secondary | ICD-10-CM | POA: Diagnosis not present

## 2022-02-19 ENCOUNTER — Other Ambulatory Visit: Payer: Self-pay | Admitting: Family Medicine

## 2022-02-19 DIAGNOSIS — I10 Essential (primary) hypertension: Secondary | ICD-10-CM

## 2022-02-23 ENCOUNTER — Other Ambulatory Visit: Payer: Self-pay | Admitting: Family Medicine

## 2022-02-23 DIAGNOSIS — I10 Essential (primary) hypertension: Secondary | ICD-10-CM

## 2022-02-25 DIAGNOSIS — J449 Chronic obstructive pulmonary disease, unspecified: Secondary | ICD-10-CM | POA: Diagnosis not present

## 2022-03-03 DIAGNOSIS — H2513 Age-related nuclear cataract, bilateral: Secondary | ICD-10-CM | POA: Diagnosis not present

## 2022-03-16 ENCOUNTER — Other Ambulatory Visit: Payer: Self-pay | Admitting: Family Medicine

## 2022-03-23 ENCOUNTER — Telehealth: Payer: Self-pay | Admitting: Family Medicine

## 2022-03-23 NOTE — Telephone Encounter (Signed)
HCTZ is not on current medication list.  Please advise.

## 2022-03-23 NOTE — Telephone Encounter (Signed)
Mr. Jonathon Graham notified as instructed by telephone.  He states his blood pressure today was 160/92 and yesterdays was 159 /94.  He states he is just having some swelling in his feet even on the Lasix and doesn't feel like he has decreased urine out put. He denied any chest pain, SOB or vision changes.  He states he is fine.  Appointment scheduled for 03/28/22 at 9 40 am for BP check. He is aware that if anything changes before Tuesday, to give [place] a call back.

## 2022-03-23 NOTE — Telephone Encounter (Signed)
He should be on metoprolol for blood pressure and Lasix for swelling.  Hydrochlorothiazide should not be used since he is already on a diuretic.  Please find out where his blood pressure has been running and if he has any symptoms associated. You likely will need an appointment for [place] to check blood pressure and talk about medication addition alternatives.

## 2022-03-23 NOTE — Telephone Encounter (Signed)
Pt called request a new proscription for RX hydrochlorothiazide  . Stated his BP has been [place]. Please advise [phone]

## 2022-03-27 DIAGNOSIS — J449 Chronic obstructive pulmonary disease, unspecified: Secondary | ICD-10-CM | POA: Diagnosis not present

## 2022-03-28 ENCOUNTER — Ambulatory Visit (INDEPENDENT_AMBULATORY_CARE_PROVIDER_SITE_OTHER): Payer: Medicare Other | Admitting: Family Medicine

## 2022-03-28 ENCOUNTER — Encounter: Payer: Self-pay | Admitting: Family Medicine

## 2022-03-28 VITALS — BP 100/64 | HR 30 | Temp 98.3°F | Ht 67.0 in | Wt 293.5 lb

## 2022-03-28 DIAGNOSIS — I152 Hypertension secondary to endocrine disorders: Secondary | ICD-10-CM | POA: Diagnosis not present

## 2022-03-28 DIAGNOSIS — R001 Bradycardia, unspecified: Secondary | ICD-10-CM

## 2022-03-28 DIAGNOSIS — E1142 Type 2 diabetes mellitus with diabetic polyneuropathy: Secondary | ICD-10-CM | POA: Diagnosis not present

## 2022-03-28 DIAGNOSIS — E1159 Type 2 diabetes mellitus with other circulatory complications: Secondary | ICD-10-CM | POA: Diagnosis not present

## 2022-03-28 MED ORDER — FLUTICASONE-SALMETEROL 250-50 MCG/ACT IN AEPB: 1.0000 | INHALATION_SPRAY | Freq: Two times a day (BID) | RESPIRATORY_TRACT | 11 refills | Status: DC

## 2022-03-28 MED ORDER — ALBUTEROL SULFATE HFA 108 (90 BASE) MCG/ACT IN AERS: 1.0000 | INHALATION_SPRAY | RESPIRATORY_TRACT | 5 refills | Status: DC | PRN

## 2022-03-28 MED ORDER — GABAPENTIN 300 MG PO CAPS: 300.0000 mg | ORAL_CAPSULE | Freq: Three times a day (TID) | ORAL | 0 refills | Status: DC

## 2022-03-28 NOTE — Progress Notes (Signed)
Patient ID: Jonathon Graham, male    DOB: 11-Jul-1955, 67 y.o.   MRN: [id number]  This visit was conducted in person.  BP 100/64   Pulse (!) 30   Temp 98.3 F (36.8 C) (Oral)   Ht _0  (1.702 m)   Wt 293 lb 8 oz (133.1 kg)   SpO2 91%   BMI 45.97 kg/m    CC:  Chief Complaint  Patient presents with   Hypertension    Subjective:   HPI: Jonathon Graham is a 66 y.o. male presenting on 03/28/2022 for Hypertension   Hypertension:  Well controlled using metoprolol 25 mg twice daily and Lasix 40 mg as needed. BP Readings from Last 3 Encounters:  03/28/22 100/64  01/13/22 116/70  11/28/21 120/74  Using medication without problems or lightheadedness: None Chest pain with exertion: None Edema: Occasional Short of breath: Stable Average home BPs: Other issues:  He is very bradycardic in the office today but is completely asymptomatic.  EKG was performed showing   Marked sinus Bradycardia HR 49-Right bundle branch block with left axis -bifascicular block. No change from previous except lower heart rate. Yes, He does have a history of bradycardia, heart failure with reduced ejection fraction followed by Dr. Gwenlyn Found.   He requested refills for his COPD medicines and medication for peripheral neuropathy. Both issues are stable.    Wt Readings from Last 3 Encounters:  03/28/22 293 lb 8 oz (133.1 kg)  01/13/22 283 lb 6 oz (128.5 kg)  11/28/21 297 lb (134.7 kg)     Relevant past medical, surgical, family and social history reviewed and updated as indicated. Interim medical history since our last visit reviewed. Allergies and medications reviewed and updated. Outpatient Medications Prior to Visit  Medication Sig Dispense Refill   albuterol (PROAIR HFA) 108 (90 Base) MCG/ACT inhaler Inhale 1-2 puffs into the lungs every 4 (four) hours as needed for wheezing or shortness of breath. 1 each 5   albuterol (PROVENTIL) (2.5 MG/3ML) 0.083% nebulizer solution Take 3 mLs (2.5 mg total) by  nebulization every 6 (six) hours as needed for wheezing or shortness of breath. 360 mL 6   aspirin 81 MG tablet Take 243 mg by mouth daily. Takes 4 76m aspirin daily.     atorvastatin (LIPITOR) 40 MG tablet Take 1 tablet by mouth once daily 90 tablet 3   betamethasone valerate ointment (VALISONE) 0.1 % Apply 1 application topically 2 (two) times daily. For up to 10 days. (Patient taking differently: Apply 1 application  topically as needed.) 30 g 0   blood glucose meter kit and supplies KIT Dispense based on patient and insurance preference. Use up to four times daily as directed. (FOR ICD-9 250.00, 250.01). 1 each 0   Cholecalciferol (VITAMIN D3) 1.25 MG (50000 UT) CAPS TAKE 1 CAPSULE BY MOUTH  ONCE A WEEK 12 capsule 0   clotrimazole (LOTRIMIN) 1 % cream Apply 1 application 2 (two) times daily topically. For 7 days (Patient taking differently: Apply 1 application  topically as needed.) 28 g 0   Continuous Blood Gluc Sensor (FREESTYLE LIBRE 3 SENSOR) MISC 1 each by Does not apply route every 14 (fourteen) days. Place 1 sensor on the skin every 14 days. Use to check glucose continuously 2 each 5   cyclobenzaprine (FLEXERIL) 10 MG tablet Take 1 tablet (10 mg total) by mouth at bedtime as needed for muscle spasms. 15 tablet 0   DULoxetine (CYMBALTA) 60 MG capsule Take 1 capsule (60 mg total)  by mouth daily. 90 capsule 1   fluticasone-salmeterol (ADVAIR) 250-50 MCG/ACT AEPB Inhale 1 puff into the lungs every 12 (twelve) hours. 60 each 11   furosemide (LASIX) 40 MG tablet TAKE 1 TABLET BY MOUTH ONCE DAILY AS NEEDED 90 tablet 0   gabapentin (NEURONTIN) 300 MG capsule TAKE 1 CAPSULE BY MOUTH THREE TIMES DAILY 270 capsule 0   ibuprofen (ADVIL) 800 MG tablet Take 1 tablet (800 mg total) by mouth every 8 (eight) hours as needed. 60 tablet 0   metFORMIN (GLUCOPHAGE) 1000 MG tablet Take 1 tablet (1,000 mg total) by mouth 2 (two) times daily with a meal. 60 tablet 0   metoprolol tartrate (LOPRESSOR) 25 MG tablet  Take 1 tablet (25 mg total) by mouth 2 (two) times daily. 180 tablet 4   ondansetron (ZOFRAN) 8 MG tablet Take 1 tablet (8 mg total) by mouth every 8 (eight) hours as needed for nausea or vomiting. 12 tablet 0   oxyCODONE-acetaminophen (PERCOCET/ROXICET) 5-325 MG tablet Take 0.5 tablets by mouth 2 (two) times daily as needed for severe pain. 30 tablet 0   potassium chloride SA (KLOR-CON) 20 MEQ tablet Take 1 tablet (20 mEq total) by mouth daily. (Patient taking differently: Take 20 mEq by mouth once a week.) 90 tablet 0   Semaglutide, 1 MG/DOSE, 4 MG/3ML SOPN Inject 1 mg as directed once a week. 3 mL 11   tamsulosin (FLOMAX) 0.4 MG CAPS capsule Take 1 capsule by mouth once daily 90 capsule 3   No facility-administered medications prior to visit.     Per HPI unless specifically indicated in ROS section below Review of Systems  Constitutional:  Negative for fatigue and fever.  HENT:  Negative for ear pain.   Eyes:  Negative for pain.  Respiratory:  Negative for cough and shortness of breath.   Cardiovascular:  Negative for chest pain, palpitations and leg swelling.  Gastrointestinal:  Negative for abdominal pain.  Genitourinary:  Negative for dysuria.  Musculoskeletal:  Negative for arthralgias.  Neurological:  Negative for syncope, light-headedness and headaches.  Psychiatric/Behavioral:  Negative for dysphoric mood.    Objective:  BP 100/64   Pulse (!) 30   Temp 98.3 F (36.8 C) (Oral)   Ht _0  (1.702 m)   Wt 293 lb 8 oz (133.1 kg)   SpO2 91%   BMI 45.97 kg/m   Wt Readings from Last 3 Encounters:  03/28/22 293 lb 8 oz (133.1 kg)  01/13/22 283 lb 6 oz (128.5 kg)  11/28/21 297 lb (134.7 kg)      Physical Exam Constitutional:      Appearance: He is well-developed.  HENT:     Head: Normocephalic.     Right Ear: Hearing normal.     Left Ear: Hearing normal.     Nose: Nose normal.  Neck:     Thyroid: No thyroid mass or thyromegaly.     Vascular: No carotid bruit.      Trachea: Trachea normal.  Cardiovascular:     Rate and Rhythm: Normal rate and regular rhythm.     Pulses: Normal pulses.     Heart sounds: Heart sounds not distant. No murmur heard.    No friction rub. No gallop.     Comments: No peripheral edema Pulmonary:     Effort: Pulmonary effort is normal. No respiratory distress.     Breath sounds: Normal breath sounds.  Skin:    General: Skin is warm and dry.     Findings: No  rash.  Psychiatric:        Speech: Speech normal.        Behavior: Behavior normal.        Thought Content: Thought content normal.       Results for orders placed or performed in visit on 02/08/22  HM DIABETES EYE EXAM  Result Value Ref Range   HM Diabetic Eye Exam No Retinopathy No Retinopathy     COVID 19 screen:  No recent travel or known exposure to [place] The patient denies respiratory symptoms of COVID 19 at this time. The importance of social distancing was discussed today.   Assessment and Plan    Problem List Items Addressed This Visit     Hypertension associated with diabetes ([place]) (Chronic)    Stable low normal, chronic.  Continue current medication.  Blood pressure is low normal.  He will hold his metoprolol given the bradycardia and we will follow blood pressure closely.        Type 2 diabetes, controlled, with peripheral neuropathy (HCC) (Chronic)   Relevant Medications   gabapentin (NEURONTIN) 300 MG capsule   Bradycardia - Primary    Asymptomatic.  No evidence of dehydration. Most likely secondary to his metoprolol.  He will hold this medication and follow his blood pressure and heart rate.        Relevant Orders   EKG 12-Lead (Completed)     Eliezer Lofts, MD

## 2022-03-28 NOTE — Assessment & Plan Note (Signed)
Asymptomatic.  No evidence of dehydration. Most likely secondary to his metoprolol.  He will hold this medication and follow his blood pressure and heart rate.

## 2022-03-28 NOTE — Patient Instructions (Addendum)
Please hold your metoprolol and follow blood pressure and heart rate at home.  Please call me within 2 days with your blood pressure and heart rate measurements.  Keep up with liquids.

## 2022-03-28 NOTE — Assessment & Plan Note (Signed)
Stable low normal, chronic.  Continue current medication.  Blood pressure is low normal.  He will hold his metoprolol given the bradycardia and we will follow blood pressure closely.

## 2022-03-29 ENCOUNTER — Other Ambulatory Visit: Payer: Self-pay | Admitting: Family Medicine

## 2022-03-29 DIAGNOSIS — E119 Type 2 diabetes mellitus without complications: Secondary | ICD-10-CM

## 2022-03-29 NOTE — Telephone Encounter (Signed)
Patient called in returning your call. He stated he takes 1 a day most days but some days 2 due to how his sugar reading is looking.

## 2022-03-29 NOTE — Telephone Encounter (Signed)
Left message for Mr. Laura to return call to clarify how he is taking his Metformin.  We have 1000 mg bid but pharmacy is requesting 1000 mg once daily.

## 2022-03-29 NOTE — Telephone Encounter (Signed)
Please advise if you want me to send in Rx for one a day or 2 daily.

## 2022-04-18 ENCOUNTER — Other Ambulatory Visit: Payer: Self-pay | Admitting: Family Medicine

## 2022-04-18 DIAGNOSIS — M25552 Pain in left hip: Secondary | ICD-10-CM

## 2022-04-18 DIAGNOSIS — M48061 Spinal stenosis, lumbar region without neurogenic claudication: Secondary | ICD-10-CM

## 2022-04-18 MED ORDER — OXYCODONE-ACETAMINOPHEN 5-325 MG PO TABS: 0.5000 | ORAL_TABLET | Freq: Two times a day (BID) | ORAL | 0 refills | Status: DC | PRN

## 2022-04-18 NOTE — Telephone Encounter (Signed)
  Encourage patient to contact the pharmacy for refills or they can request refills through [facility]  Did the patient contact the pharmacy:  yes   LAST APPOINTMENT DATE:  03/28/22  NEXT APPOINTMENT DATE:  MEDICATION:oxyCODONE-acetaminophen (PERCOCET/ROXICET) 5-325 MG tablet   Is the patient out of medication? yes  If not, how much is left?  Is this a 90 day supply: 30  PHARMACY: [facility] [address] [place] - [address] Phone: [phone]  Fax: [phone]      Let patient know to contact pharmacy at the end of the day to make sure medication is ready.  Please notify patient to allow 48-72 hours to process

## 2022-04-18 NOTE — Telephone Encounter (Signed)
Last office visit 03/28/22 for Bradycardia, HTN and DM.  Last refilled 01/13/22 for #30 with no refills.  UDS/Contract 02/18/21.  Next Appt: 06/28/21 for DM/HTN.

## 2022-04-27 DIAGNOSIS — J449 Chronic obstructive pulmonary disease, unspecified: Secondary | ICD-10-CM | POA: Diagnosis not present

## 2022-05-15 ENCOUNTER — Telehealth: Payer: Self-pay

## 2022-05-15 DIAGNOSIS — R112 Nausea with vomiting, unspecified: Secondary | ICD-10-CM | POA: Diagnosis not present

## 2022-05-15 DIAGNOSIS — R509 Fever, unspecified: Secondary | ICD-10-CM | POA: Diagnosis not present

## 2022-05-15 DIAGNOSIS — R1084 Generalized abdominal pain: Secondary | ICD-10-CM | POA: Diagnosis not present

## 2022-05-15 NOTE — Telephone Encounter (Signed)
Noted and agree with disposition. 

## 2022-05-15 NOTE — Telephone Encounter (Signed)
I spoke with pt and he is going to go to an UC in [place]. Sending note to Dr Ermalene Searing who is out of office today and [place] pool.

## 2022-05-15 NOTE — Telephone Encounter (Signed)
Jonathon Graham with access nurse said that pt started and continues with abd pain since 05/13/22. Access disposition to be seen within 4 hrs. No available appts at [facility] and Jonathon Graham will let pt know to go to [facility] or ED. Will attach access note when available.    [facility] Day - Client TELEPHONE ADVICE RECORD AccessNurse Patient Name: Jonathon Graham Gender: Male DOB: [birth year] Age: 66 Y 1 D Return Phone Number: [phone] (Primary), [phone] (Secondary) Address: [place] [address] Client [facility] Day - Client Client Site [facility] - Day Provider Jonathon Graham Contact Type Call Who Is Calling Patient / Member / Family / Caregiver Call Type Triage / Clinical Relationship To Patient Self Return Phone Number [phone] (Primary) Chief Complaint CHEST PAIN - pain, pressure, heaviness or tightness Reason for Call Symptomatic / Request for Health Information Initial Comment Caller states he has had stomach pain since Saturday. Caller states he may also have the FLU. Caller states the pain been moving into his chest. Translation No Nurse Assessment Nurse: Jonathon Graham, Jonathon Graham, Jonathon Graham Date/Time (Eastern Time): 05/15/2022 8:55:57 AM Confirm and document reason for call. If symptomatic, describe symptoms. ---Caller states he has had stomach pain since Saturday. It starts at the bottom & goes to the top then goes back to the bottom. It is 4/10 right now. It intensifies at night. 8-9/10. He states the pain has been moving into his chest. He could not sleep. It is a constant ache. Caller states he may also have the FLU. He is over those sx now. It started Thursday & he was feeling better by Sat. No cold, cough, or congestion now. Does the patient have any new or worsening symptoms? ---Yes Will a triage be completed? ---Yes Related visit to physician within the last 2 weeks? ---No Does the PT have  any chronic conditions? (i.e. diabetes, asthma, this includes High risk factors for pregnancy, etc.) ---Yes List chronic conditions. ---COPD, Fibromyalgia, DM, HTN, Enlarged prostate, heart disease Is this a behavioral health or substance abuse call? ---No Guidelines Guideline Title Affirmed Question Affirmed Notes Nurse Date/Time Jonathon Graham Time) Abdominal Pain - Male [1] MILDMODERATE pain Lovelace, Jonathon Graham, Jonathon Graham 05/15/2022 8:58:39 AM PLEASE NOTE: All timestamps contained within this report are represented as [place] Standard Time. CONFIDENTIALTY NOTICE: This fax transmission is intended only for the addressee. It contains information that is legally privileged, confidential or otherwise protected from use or disclosure. If you are not the intended recipient, you are strictly prohibited from reviewing, disclosing, copying using or disseminating any of this information or taking any action in reliance on or regarding this information. If you have received this fax in error, please notify us immediately by telephone so that we can arrange for its return to [place]. Phone: [phone], Toll-Free: [phone], Fax: [phone] Page: 2 of 2 Call Id: [id number] Guidelines Guideline Title Affirmed Question Affirmed Notes Nurse Date/Time Jonathon Graham Time) AND [2] constant AND [3] present > 2 hours Disp. Time Jonathon Graham Time) Disposition Final User 05/15/2022 8:54:20 AM Send to Urgent Jonathon Graham, Jonathon Graham 05/15/2022 9:00:29 AM See HCP within 4 Hours (or PCP triage) Yes Lovelace, Jonathon Graham, Jonathon Graham Final Disposition 05/15/2022 9:00:29 AM See HCP within 4 Hours (or PCP triage) Yes Lo[place]ace, Jonathon Graham, Jonathon Graham Caller Disagree/Comply Comply Caller Understands Yes PreDisposition InappropriateToAsk Care Advice Given Per Guideline SEE HCP (OR PCP TRIAGE) WITHIN 4 HOURS: * IF OFFICE WILL BE OPEN: You need to be seen within the next  3 or 4 hours. Call your doctor (or NP/PA) now or as soon as the office opens. CALL BACK IF: *  You become worse CARE ADVICE given per Abdominal Pain - Male (Adult) guideline. Comments User: Jonathon Graham, Jonathon Graham Date/Time Jonathon Graham Time): 05/15/2022 9:04:37 AM Spoke to Jonathon Graham at the back line. She stated there were no available appts. in the next 4 hours. Notified pt. & advised he go to UC or ER. Referrals REFERRED TO PCP OFFIC

## 2022-05-27 DIAGNOSIS — J449 Chronic obstructive pulmonary disease, unspecified: Secondary | ICD-10-CM | POA: Diagnosis not present

## 2022-06-16 DIAGNOSIS — G4733 Obstructive sleep apnea (adult) (pediatric): Secondary | ICD-10-CM | POA: Diagnosis not present

## 2022-06-20 ENCOUNTER — Ambulatory Visit: Payer: 59 | Attending: Cardiovascular Disease | Admitting: Cardiovascular Disease

## 2022-06-20 ENCOUNTER — Encounter: Payer: Self-pay | Admitting: Cardiovascular Disease

## 2022-06-20 VITALS — BP 122/80 | HR 100 | Ht 68.0 in | Wt 289.2 lb

## 2022-06-20 DIAGNOSIS — E785 Hyperlipidemia, unspecified: Secondary | ICD-10-CM | POA: Diagnosis not present

## 2022-06-20 DIAGNOSIS — G4733 Obstructive sleep apnea (adult) (pediatric): Secondary | ICD-10-CM | POA: Diagnosis not present

## 2022-06-20 DIAGNOSIS — E1169 Type 2 diabetes mellitus with other specified complication: Secondary | ICD-10-CM

## 2022-06-20 DIAGNOSIS — I152 Hypertension secondary to endocrine disorders: Secondary | ICD-10-CM

## 2022-06-20 DIAGNOSIS — E1159 Type 2 diabetes mellitus with other circulatory complications: Secondary | ICD-10-CM | POA: Diagnosis not present

## 2022-06-20 DIAGNOSIS — I5020 Unspecified systolic (congestive) heart failure: Secondary | ICD-10-CM

## 2022-06-20 DIAGNOSIS — I447 Left bundle-branch block, unspecified: Secondary | ICD-10-CM

## 2022-06-20 MED ORDER — METOPROLOL TARTRATE 25 MG PO TABS: 12.5000 mg | ORAL_TABLET | Freq: Two times a day (BID) | ORAL | Status: DC

## 2022-06-20 NOTE — Assessment & Plan Note (Addendum)
He has had right bundle branch block in the past as well.

## 2022-06-20 NOTE — Assessment & Plan Note (Signed)
History of obstructive sleep apnea on CPAP. 

## 2022-06-20 NOTE — Assessment & Plan Note (Signed)
History of essential hypertension on metoprolol.  He was somewhat hypotensive when he was taking hydrochlorothiazide which has been discontinued.

## 2022-06-20 NOTE — Progress Notes (Signed)
06/20/2022 Jonathon Graham   January 18, 1956  [id number]  Primary Physician Jinny Sanders, MD Primary Cardiologist: Lorretta Harp MD Lupe Carney, [place]  HPI:  Jonathon Graham is a 67 y.o.  severely overweight married African-American male father of 50, grandfather of 18 grandchildren and great grandfather to 56 great grandchildren was referred by Algie Coffer, MD, for evaluation of LV dysfunction.  He is disabled because of back issues.  I last saw him in the office 06/22/2021.  His risk factors include 15 pack years of tobacco abuse having quit last year, treated hyperlipidemia and diabetes.  There is no family history of heart disease.  He is never had a heart tach or stroke.  He denies chest pain or shortness of breath.  He just renovated his house by himself without limitation.  He has lost 100 pounds over the last year to by diet and exercise.  He also contracted COVID back in April of this year and only had minimal symptoms.  A 2D echocardiogram performed 01/18/2021 revealed an EF of 40 to 45% without valvular abnormalities.  Etiology of his LV dysfunction is still unclear.  I began him on low-dose beta-blocker (metoprolol 12.5 mg p.o. twice daily) and had our Pharm.D.'s to titrate this as an outpatient but unfortunately because of "soft blood pressure this could not occur.  He did have a follow-up 2D echo performed 06/06/2021 revealing a similar EF to his previous echo of 45% with grade 1 diastolic dysfunction.  There are no valvular abnormalities.  A coronary CTA performed 03/17/2021 revealed a coronary calcium score of 128 with no obstructive CAD noted.  Since I saw him a year ago he continues to do well.  His weight is stable although he is trying to lose weight.  He was taken off hydrochlorothiazide because of hypotension.  He denies chest pain or shortness of breath.   Current Meds  Medication Sig   albuterol (PROAIR HFA) 108 (90 Base) MCG/ACT inhaler Inhale 1-2 puffs into the lungs  every 4 (four) hours as needed for wheezing or shortness of breath.   albuterol (PROVENTIL) (2.5 MG/3ML) 0.083% nebulizer solution Take 3 mLs (2.5 mg total) by nebulization every 6 (six) hours as needed for wheezing or shortness of breath.   aspirin 81 MG tablet Take 243 mg by mouth daily. Takes 4 81mg  aspirin daily.   atorvastatin (LIPITOR) 40 MG tablet Take 1 tablet by mouth once daily   betamethasone valerate ointment (VALISONE) 0.1 % Apply 1 application topically 2 (two) times daily. For up to 10 days. (Patient taking differently: Apply 1 application  topically as needed.)   blood glucose meter kit and supplies KIT Dispense based on patient and insurance preference. Use up to four times daily as directed. (FOR ICD-9 250.00, 250.01).   Cholecalciferol (VITAMIN D3) 1.25 MG (50000 UT) CAPS TAKE 1 CAPSULE BY MOUTH  ONCE A WEEK   clotrimazole (LOTRIMIN) 1 % cream Apply 1 application 2 (two) times daily topically. For 7 days (Patient taking differently: Apply 1 application  topically as needed.)   Continuous Blood Gluc Sensor (FREESTYLE LIBRE 3 SENSOR) MISC 1 each by Does not apply route every 14 (fourteen) days. Place 1 sensor on the skin every 14 days. Use to check glucose continuously   cyclobenzaprine (FLEXERIL) 10 MG tablet Take 1 tablet (10 mg total) by mouth at bedtime as needed for muscle spasms.   DULoxetine (CYMBALTA) 60 MG capsule Take 1 capsule (60 mg total) by mouth daily.  fluticasone-salmeterol (ADVAIR) 250-50 MCG/ACT AEPB Inhale 1 puff into the lungs every 12 (twelve) hours.   furosemide (LASIX) 40 MG tablet TAKE 1 TABLET BY MOUTH ONCE DAILY AS NEEDED   gabapentin (NEURONTIN) 300 MG capsule Take 1 capsule (300 mg total) by mouth 3 (three) times daily.   ibuprofen (ADVIL) 800 MG tablet Take 1 tablet (800 mg total) by mouth every 8 (eight) hours as needed.   metFORMIN (GLUCOPHAGE) 1000 MG tablet Take 1 tablet (1,000 mg total) by mouth 2 (two) times daily with a meal.   ondansetron  (ZOFRAN) 8 MG tablet Take 1 tablet (8 mg total) by mouth every 8 (eight) hours as needed for nausea or vomiting.   oxyCODONE-acetaminophen (PERCOCET/ROXICET) 5-325 MG tablet Take 0.5 tablets by mouth 2 (two) times daily as needed for severe pain.   potassium chloride SA (KLOR-CON) 20 MEQ tablet Take 1 tablet (20 mEq total) by mouth daily. (Patient taking differently: Take 20 mEq by mouth once a week.)   Semaglutide, 1 MG/DOSE, 4 MG/3ML SOPN Inject 1 mg as directed once a week.   tamsulosin (FLOMAX) 0.4 MG CAPS capsule Take 1 capsule by mouth once daily     Allergies  Allergen Reactions   Penicillins Anaphylaxis and Swelling    Social History   Socioeconomic History   Marital status: Married    Spouse name: Not on file   Number of children: Not on file   Years of education: Not on file   Highest education level: Not on file  Occupational History   Occupation: retired  Tobacco Use   Smoking status: Former    Types: Cigarettes    Quit date: 03/28/2017    Years since quitting: 5.2   Smokeless tobacco: Never  Substance and Sexual Activity   Alcohol use: No    Alcohol/week: 0.0 standard drinks of alcohol   Drug use: No   Sexual activity: Yes  Other Topics Concern   Not on file  Social History Narrative   Not on file   Social Determinants of Health   Financial Resource Strain: Low Risk  (09/22/2021)   Overall Financial Resource Strain (CARDIA)    Difficulty of Paying Living Expenses: Not hard at all  Food Insecurity: No Food Insecurity (09/22/2021)   Hunger Vital Sign    Worried About Running Out of Food in the Last Year: Never true    [place] in the Last Year: Never true  Transportation Needs: No Transportation Needs (09/22/2021)   PRAPARE - Hydrologist (Medical): No    Lack of Transportation (Non-Medical): No  Physical Activity: Sufficiently Active (09/22/2021)   Exercise Vital Sign    Days of Exercise per Week: 3 days    Minutes of  Exercise per Session: 150+ min  Stress: No Stress Concern Present (09/22/2021)   [place]    Feeling of Stress : Not at all  Social Connections: [place] (09/22/2021)   Social Connection and Isolation Panel [NHANES]    Frequency of Communication with Friends and Family: More than three times a week    Frequency of Social Gatherings with Friends and Family: More than three times a week    Attends Religious Services: More than 4 times per year    Active Member of [employer] or Organizations: Yes    Attends Music therapist: More than 4 times per year    Marital Status: Married  Human resources officer Violence:  Not At Risk (09/22/2021)   Humiliation, Afraid, Rape, and Kick questionnaire    Fear of Current or Ex-Partner: No    Emotionally Abused: No    Physically Abused: No    Sexually Abused: No     Review of Systems: General: negative for chills, fever, night sweats or weight changes.  Cardiovascular: negative for chest pain, dyspnea on exertion, edema, orthopnea, palpitations, paroxysmal nocturnal dyspnea or shortness of breath Dermatological: negative for rash Respiratory: negative for cough or wheezing Urologic: negative for hematuria Abdominal: negative for nausea, vomiting, diarrhea, bright red blood per rectum, melena, or hematemesis Neurologic: negative for visual changes, syncope, or dizziness All other systems reviewed and are otherwise negative except as noted above.    Blood pressure 122/80, pulse 100, height 5\' 8"  (1.727 m), weight 289 lb 3.2 oz (131.2 kg), SpO2 91 %.  General appearance: alert and no distress Neck: no adenopathy, no carotid bruit, no JVD, supple, symmetrical, trachea midline, and thyroid not enlarged, symmetric, no tenderness/mass/nodules Lungs: clear to auscultation bilaterally Heart: regular rate and rhythm, S1, S2 normal, no murmur, click, rub or gallop Extremities:  extremities normal, atraumatic, no cyanosis or edema Pulses: 2+ and symmetric Skin: Skin color, texture, turgor normal. No rashes or lesions Neurologic: Grossly normal  EKG not performed today  ASSESSMENT AND PLAN:   Severe obstructive sleep apnea History of obstructive sleep apnea on CPAP.  Hypertension associated with diabetes (HCC) History of essential hypertension on metoprolol.  He was somewhat hypotensive when he was taking hydrochlorothiazide which has been discontinued.  Hyperlipidemia associated with type 2 diabetes mellitus (HCC) History of hyperlipidemia on statin therapy with lipid profile performed 01/05/2022 revealing total cholesterol 104, LDL 49 HDL 42.  HFrEF (heart failure with reduced ejection fraction) (HCC) History of heart failure with reduced EF on low-dose metoprolol and furosemide.  His last 2D echo performed 06/06/2021 revealed EF of 45% with mild global hypokinesia and grade 1 diastolic dysfunction.  He had no valvular abnormalities.  He is completely asymptomatic.  I am going to recheck a 2D echocardiogram.  It is unclear to me why he is not on an ACE/ARB.  Left bundle branch block He has had right bundle branch block in the past as well.     08/04/2021 MD FACP,FACC,FAHA, [facility] 06/20/2022 10:39 AM

## 2022-06-20 NOTE — Patient Instructions (Signed)
Medication Instructions:  Your physician recommends that you continue on your current medications as directed. Please refer to the Current Medication list given to you today.  *If you need a refill on your cardiac medications before your next appointment, please call your pharmacy*   Testing/Procedures: Your physician has requested that you have an echocardiogram. Echocardiography is a painless test that uses sound waves to create images of your heart. It provides your doctor with information about the size and shape of your heart and how well your heart's chambers and valves are working. This procedure takes approximately one hour. There are no restrictions for this procedure. Please do NOT wear cologne, perfume, aftershave, or lotions (deodorant is allowed). Please arrive 15 minutes prior to your appointment time. This procedure will be done at [address]    Follow-Up: At [facility], you and your health needs are our priority.  As part of our continuing mission to provide you with exceptional heart care, we have created designated Provider Care Teams.  These Care Teams include your primary Cardiologist (physician) and Advanced Practice Providers (APPs -  Physician Assistants and Nurse Practitioners) who all work together to provide you with the care you need, when you need it.  We recommend signing up for the patient portal called "MyChart".  Sign up information is provided on this After Visit Summary.  MyChart is used to connect with patients for Virtual Visits (Telemedicine).  Patients are able to view lab/test results, encounter notes, upcoming appointments, etc.  Non-urgent messages can be sent to your provider as well.   To learn more about what you can do with MyChart, go to [email or web address].    Your next appointment:   12 month(s)  Provider:   Jonathan Berry, MD    

## 2022-06-20 NOTE — Assessment & Plan Note (Signed)
History of hyperlipidemia on statin therapy with lipid profile performed 01/05/2022 revealing total cholesterol 104, LDL 49 HDL 42.

## 2022-06-20 NOTE — Assessment & Plan Note (Signed)
History of heart failure with reduced EF on low-dose metoprolol and furosemide.  His last 2D echo performed 06/06/2021 revealed EF of 45% with mild global hypokinesia and grade 1 diastolic dysfunction.  He had no valvular abnormalities.  He is completely asymptomatic.  I am going to recheck a 2D echocardiogram.  It is unclear to me why he is not on an ACE/ARB.

## 2022-06-27 DIAGNOSIS — J449 Chronic obstructive pulmonary disease, unspecified: Secondary | ICD-10-CM | POA: Diagnosis not present

## 2022-06-28 ENCOUNTER — Other Ambulatory Visit: Payer: Self-pay | Admitting: Family Medicine

## 2022-06-28 ENCOUNTER — Ambulatory Visit: Payer: Medicare Other | Admitting: Family Medicine

## 2022-06-28 DIAGNOSIS — E119 Type 2 diabetes mellitus without complications: Secondary | ICD-10-CM

## 2022-07-04 ENCOUNTER — Encounter: Payer: Self-pay | Admitting: Family Medicine

## 2022-07-04 ENCOUNTER — Ambulatory Visit (INDEPENDENT_AMBULATORY_CARE_PROVIDER_SITE_OTHER): Payer: 59 | Admitting: Family Medicine

## 2022-07-04 VITALS — BP 120/80 | HR 59 | Temp 96.7°F | Ht 67.0 in | Wt 288.4 lb

## 2022-07-04 DIAGNOSIS — M545 Low back pain, unspecified: Secondary | ICD-10-CM

## 2022-07-04 DIAGNOSIS — G8929 Other chronic pain: Secondary | ICD-10-CM | POA: Diagnosis not present

## 2022-07-04 DIAGNOSIS — E1142 Type 2 diabetes mellitus with diabetic polyneuropathy: Secondary | ICD-10-CM | POA: Diagnosis not present

## 2022-07-04 DIAGNOSIS — G894 Chronic pain syndrome: Secondary | ICD-10-CM

## 2022-07-04 DIAGNOSIS — M25552 Pain in left hip: Secondary | ICD-10-CM | POA: Diagnosis not present

## 2022-07-04 DIAGNOSIS — M48061 Spinal stenosis, lumbar region without neurogenic claudication: Secondary | ICD-10-CM

## 2022-07-04 LAB — POCT GLYCOSYLATED HEMOGLOBIN (HGB A1C): Hemoglobin A1C: 6.6 % — AB (ref 4.0–5.6)

## 2022-07-04 MED ORDER — OXYCODONE-ACETAMINOPHEN 5-325 MG PO TABS: 0.5000 | ORAL_TABLET | Freq: Two times a day (BID) | ORAL | 0 refills | Status: AC | PRN

## 2022-07-04 MED ORDER — OXYCODONE-ACETAMINOPHEN 5-325 MG PO TABS: 0.5000 | ORAL_TABLET | Freq: Two times a day (BID) | ORAL | 0 refills | Status: DC | PRN

## 2022-07-04 NOTE — Progress Notes (Signed)
Patient ID: Jonathon Graham, male    DOB: 10-09-1955, 67 y.o.   MRN: [id number]  This visit was conducted in person.  BP 120/80   Pulse (!) 59   Temp (!) 96.7 F (35.9 C) (Temporal)   Ht 5\' 7"  (1.702 m)   Wt 288 lb 6 oz (130.8 kg)   SpO2 96%   BMI 45.17 kg/m    CC:  Chief Complaint  Patient presents with   Diabetes    Subjective:   HPI: Jonathon Graham is a 67 y.o. male presenting on 07/04/2022 for Diabetes  Unfortunately he and his brother died suddenly last night..  Diabetes:  Improved control, from last A1C at 7, eating less overall. Lab Results  Component Value Date   HGBA1C 6.6 (A) 07/04/2022  On Ozempic 1 mg weekly and metformin 1000 mg p.o. daily. Using medications without difficulties: none Hypoglycemic episodes:none Hyperglycemic episodes: none Feet problems: none Blood Sugars averaging: FBS at goal eye exam within last year: yes  Wt Readings from Last 3 Encounters:  07/04/22 288 lb 6 oz (130.8 kg)  06/20/22 289 lb 3.2 oz (131.2 kg)  03/28/22 293 lb 8 oz (133.1 kg)   He has joined [employer].. plan to start at gym. Has set up a Physiological scientist.  Indication for chronic opioid:  chronic low back pain Medication and dose:  oxycodone 5/325 mg  0.5 to 1 tablet daily. # pills per month: 30 Last UDS date: 01/2021 Opioid Treatment Agreement signed (Y/N):  due  Opioid Treatment Agreement last reviewed with patient:   none NCCSRS reviewed this encounter (include red flags): Yes  no red flags.   PDMP reviewed during this encounter.    Relevant past medical, surgical, family and social history reviewed and updated as indicated. Interim medical history since our last visit reviewed. Allergies and medications reviewed and updated. Outpatient Medications Prior to Visit  Medication Sig Dispense Refill   albuterol (PROAIR HFA) 108 (90 Base) MCG/ACT inhaler Inhale 1-2 puffs into the lungs every 4 (four) hours as needed for wheezing or shortness of  breath. 18 each 5   albuterol (PROVENTIL) (2.5 MG/3ML) 0.083% nebulizer solution Take 3 mLs (2.5 mg total) by nebulization every 6 (six) hours as needed for wheezing or shortness of breath. 360 mL 6   aspirin 81 MG tablet Take 243 mg by mouth daily. Takes 4 81mg  aspirin daily.     atorvastatin (LIPITOR) 40 MG tablet Take 1 tablet by mouth once daily 90 tablet 3   betamethasone valerate ointment (VALISONE) 0.1 % Apply 1 application topically 2 (two) times daily. For up to 10 days. 30 g 0   blood glucose meter kit and supplies KIT Dispense based on patient and insurance preference. Use up to four times daily as directed. (FOR ICD-9 250.00, 250.01). 1 each 0   Cholecalciferol (VITAMIN D3) 1.25 MG (50000 UT) CAPS TAKE 1 CAPSULE BY MOUTH  ONCE A WEEK 12 capsule 0   clotrimazole (LOTRIMIN) 1 % cream Apply 1 application 2 (two) times daily topically. For 7 days 28 g 0   Continuous Blood Gluc Sensor (FREESTYLE LIBRE 3 SENSOR) MISC 1 each by Does not apply route every 14 (fourteen) days. Place 1 sensor on the skin every 14 days. Use to check glucose continuously 2 each 5   cyclobenzaprine (FLEXERIL) 10 MG tablet Take 1 tablet (10 mg total) by mouth at bedtime as needed for muscle spasms. 15 tablet 0   DULoxetine (CYMBALTA) 60  MG capsule Take 1 capsule (60 mg total) by mouth daily. 90 capsule 1   fluticasone-salmeterol (ADVAIR) 250-50 MCG/ACT AEPB Inhale 1 puff into the lungs every 12 (twelve) hours. 60 each 11   furosemide (LASIX) 40 MG tablet TAKE 1 TABLET BY MOUTH ONCE DAILY AS NEEDED 90 tablet 0   gabapentin (NEURONTIN) 300 MG capsule Take 1 capsule (300 mg total) by mouth 3 (three) times daily. 270 capsule 0   ibuprofen (ADVIL) 800 MG tablet Take 1 tablet (800 mg total) by mouth every 8 (eight) hours as needed. 60 tablet 0   metFORMIN (GLUCOPHAGE) 1000 MG tablet TAKE 1 TABLET BY MOUTH TWICE DAILY A MEAL 180 tablet 0   metoprolol tartrate (LOPRESSOR) 25 MG tablet Take 0.5 tablets (12.5 mg total) by mouth 2  (two) times daily.     ondansetron (ZOFRAN) 8 MG tablet Take 1 tablet (8 mg total) by mouth every 8 (eight) hours as needed for nausea or vomiting. 12 tablet 0   potassium chloride SA (KLOR-CON) 20 MEQ tablet Take 1 tablet (20 mEq total) by mouth daily. (Patient taking differently: Take 20 mEq by mouth once a week.) 90 tablet 0   Semaglutide, 1 MG/DOSE, 4 MG/3ML SOPN Inject 1 mg as directed once a week. 3 mL 11   tamsulosin (FLOMAX) 0.4 MG CAPS capsule Take 1 capsule by mouth once daily 90 capsule 3   oxyCODONE-acetaminophen (PERCOCET/ROXICET) 5-325 MG tablet Take 0.5 tablets by mouth 2 (two) times daily as needed for severe pain. 30 tablet 0   No facility-administered medications prior to visit.     Per HPI unless specifically indicated in ROS section below Review of Systems  Constitutional:  Negative for fatigue and fever.  HENT:  Negative for ear pain.   Eyes:  Negative for pain.  Respiratory:  Negative for cough and shortness of breath.   Cardiovascular:  Negative for chest pain, palpitations and leg swelling.  Gastrointestinal:  Negative for abdominal pain.  Genitourinary:  Negative for dysuria.  Musculoskeletal:  Negative for arthralgias.  Neurological:  Negative for syncope, light-headedness and headaches.  Psychiatric/Behavioral:  Negative for dysphoric mood.    Objective:  BP 120/80   Pulse (!) 59   Temp (!) 96.7 F (35.9 C) (Temporal)   Ht 5\' 7"  (1.702 m)   Wt 288 lb 6 oz (130.8 kg)   SpO2 96%   BMI 45.17 kg/m   Wt Readings from Last 3 Encounters:  07/04/22 288 lb 6 oz (130.8 kg)  06/20/22 289 lb 3.2 oz (131.2 kg)  03/28/22 293 lb 8 oz (133.1 kg)      Physical Exam Constitutional:      Appearance: He is well-developed. He is obese.  HENT:     Head: Normocephalic.     Right Ear: Hearing normal.     Left Ear: Hearing normal.     Nose: Nose normal.  Neck:     Thyroid: No thyroid mass or thyromegaly.     Vascular: No carotid bruit.     Trachea: Trachea normal.   Cardiovascular:     Rate and Rhythm: Normal rate and regular rhythm.     Pulses: Normal pulses.     Heart sounds: Heart sounds not distant. No murmur heard.    No friction rub. No gallop.     Comments: No peripheral edema Pulmonary:     Effort: Pulmonary effort is normal. No respiratory distress.     Breath sounds: Normal breath sounds.  Skin:    General:  Skin is warm and dry.     Findings: No rash.  Psychiatric:        Speech: Speech normal.        Behavior: Behavior normal.        Thought Content: Thought content normal.       Results for orders placed or performed in visit on 07/04/22  POCT glycosylated hemoglobin (Hb A1C)  Result Value Ref Range   Hemoglobin A1C 6.6 (A) 4.0 - 5.6 %   HbA1c POC (<> result, manual entry)     HbA1c, POC (prediabetic range)     HbA1c, POC (controlled diabetic range)      Assessment and Plan  Type 2 diabetes, controlled, with peripheral neuropathy ([place]) Assessment & Plan: Chronic, improved control on increased dose of Ozempic at 1 mg weekly.  He has noted some weight loss.  Also on metformin 1000 mg p.o. daily.  He plans to increase physical activity and has now obtained a membership at a gym and has Physiological scientist.  Repeat A1c in 6 months.  Orders: -     POCT glycosylated hemoglobin (Hb A1C)  Chronic midline low back pain, unspecified whether sciatica present -     DRUG MONITORING, PANEL 8 WITH CONFIRMATION, URINE  Chronic pain syndrome Assessment & Plan: Chronic, pain in her low back He will start stretching and core strengthening with a personal trainer.  I encouraged him to continue to work on weight loss as this will decrease the strain on his back. Due for urine drug screen and pain medicine contract reviewed and signed. 3 prescriptions for the next 3 months for oxycodone 5/320 5/2 to 1 tablet daily provided.  30 pills/month.  Orders: -     DRUG MONITORING, PANEL 8 WITH CONFIRMATION, URINE  Left hip pain -      oxyCODONE-Acetaminophen; Take 0.5 tablets by mouth 2 (two) times daily as needed for severe pain.  Dispense: 30 tablet; Refill: 0  Spinal stenosis of lumbar region, unspecified whether neurogenic claudication present -     oxyCODONE-Acetaminophen; Take 0.5 tablets by mouth 2 (two) times daily as needed for severe pain.  Dispense: 30 tablet; Refill: 0  Other orders -     oxyCODONE-Acetaminophen; Take 0.5 tablets by mouth 2 (two) times daily as needed for severe pain.  Dispense: 30 tablet; Refill: 0 -     oxyCODONE-Acetaminophen; Take 0.5 tablets by mouth 2 (two) times daily as needed for severe pain.  Dispense: 30 tablet; Refill: 0    Return in about 3 months (around 10/02/2022) for chronic pain management.   Eliezer Lofts, MD

## 2022-07-04 NOTE — Assessment & Plan Note (Signed)
Chronic, pain in her low back He will start stretching and core strengthening with a personal trainer.  I encouraged him to continue to work on weight loss as this will decrease the strain on his back. Due for urine drug screen and pain medicine contract reviewed and signed. 3 prescriptions for the next 3 months for oxycodone 5/320 5/2 to 1 tablet daily provided.  30 pills/month.

## 2022-07-04 NOTE — Assessment & Plan Note (Signed)
Chronic, improved control on increased dose of Ozempic at 1 mg weekly.  He has noted some weight loss.  Also on metformin 1000 mg p.o. daily.  He plans to increase physical activity and has now obtained a membership at a gym and has Physiological scientist.  Repeat A1c in 6 months.

## 2022-07-06 LAB — DRUG MONITORING, PANEL 8 WITH CONFIRMATION, URINE
6 Acetylmorphine: NEGATIVE ng/mL (ref <10)
Alcohol Metabolites: NEGATIVE ng/mL (ref <500)
Amphetamines: NEGATIVE ng/mL (ref <500)
Benzodiazepines: NEGATIVE ng/mL (ref <100)
Buprenorphine, Urine: NEGATIVE ng/mL (ref <5)
Cocaine Metabolite: NEGATIVE ng/mL (ref <150)
Creatinine: 123.9 mg/dL (ref >=20.0)
MDMA: NEGATIVE ng/mL (ref <500)
Marijuana Metabolite: NEGATIVE ng/mL (ref <20)
Noroxycodone: 330 ng/mL — ABNORMAL HIGH (ref <50)
Opiates: NEGATIVE ng/mL (ref <100)
Oxidant: NEGATIVE ug/mL (ref <200)
Oxycodone: NEGATIVE ng/mL (ref <50)
Oxycodone: POSITIVE ng/mL — AB (ref <100)
Oxymorphone: 180 ng/mL — ABNORMAL HIGH (ref <50)
pH: 6.5 (ref 4.5–9.0)

## 2022-07-06 LAB — DM TEMPLATE

## 2022-07-11 ENCOUNTER — Other Ambulatory Visit: Payer: Self-pay | Admitting: Family Medicine

## 2022-07-11 DIAGNOSIS — I10 Essential (primary) hypertension: Secondary | ICD-10-CM

## 2022-07-13 ENCOUNTER — Ambulatory Visit (HOSPITAL_COMMUNITY): Payer: 59 | Attending: Cardiology

## 2022-07-13 DIAGNOSIS — E785 Hyperlipidemia, unspecified: Secondary | ICD-10-CM | POA: Diagnosis not present

## 2022-07-13 DIAGNOSIS — I152 Hypertension secondary to endocrine disorders: Secondary | ICD-10-CM

## 2022-07-13 DIAGNOSIS — G4733 Obstructive sleep apnea (adult) (pediatric): Secondary | ICD-10-CM | POA: Diagnosis not present

## 2022-07-13 DIAGNOSIS — E1169 Type 2 diabetes mellitus with other specified complication: Secondary | ICD-10-CM | POA: Diagnosis not present

## 2022-07-13 DIAGNOSIS — I5020 Unspecified systolic (congestive) heart failure: Secondary | ICD-10-CM | POA: Diagnosis not present

## 2022-07-13 DIAGNOSIS — I447 Left bundle-branch block, unspecified: Secondary | ICD-10-CM | POA: Diagnosis not present

## 2022-07-13 DIAGNOSIS — E1159 Type 2 diabetes mellitus with other circulatory complications: Secondary | ICD-10-CM | POA: Diagnosis not present

## 2022-07-13 LAB — ECHOCARDIOGRAM COMPLETE
Area-P 1/2: 2.69 cm2
S' Lateral: 4 cm

## 2022-07-14 ENCOUNTER — Other Ambulatory Visit: Payer: Self-pay

## 2022-07-14 DIAGNOSIS — E1169 Type 2 diabetes mellitus with other specified complication: Secondary | ICD-10-CM

## 2022-07-14 DIAGNOSIS — E1159 Type 2 diabetes mellitus with other circulatory complications: Secondary | ICD-10-CM

## 2022-07-17 ENCOUNTER — Other Ambulatory Visit: Payer: Self-pay | Admitting: *Deleted

## 2022-07-17 ENCOUNTER — Encounter: Payer: Self-pay | Admitting: *Deleted

## 2022-07-17 DIAGNOSIS — D649 Anemia, unspecified: Secondary | ICD-10-CM | POA: Diagnosis not present

## 2022-07-17 DIAGNOSIS — R9431 Abnormal electrocardiogram [ECG] [EKG]: Secondary | ICD-10-CM | POA: Diagnosis not present

## 2022-07-17 DIAGNOSIS — R42 Dizziness and giddiness: Secondary | ICD-10-CM | POA: Diagnosis not present

## 2022-07-17 MED ORDER — SEMAGLUTIDE (1 MG/DOSE) 4 MG/3ML ~~LOC~~ SOPN: 1.0000 mg | PEN_INJECTOR | SUBCUTANEOUS | 3 refills | Status: DC

## 2022-07-17 NOTE — Telephone Encounter (Signed)
This encounter was created in error - please disregard.

## 2022-07-18 ENCOUNTER — Telehealth: Payer: Self-pay | Admitting: Family Medicine

## 2022-07-18 MED ORDER — CYCLOBENZAPRINE HCL 10 MG PO TABS: 10.0000 mg | ORAL_TABLET | Freq: Every evening | ORAL | 0 refills | Status: AC | PRN

## 2022-07-18 NOTE — Telephone Encounter (Signed)
Prescription for cyclobenzaprine sent to [facility] as requested.  If patient's symptoms or not improving as expected he can make an appt for further eval.

## 2022-07-18 NOTE — Telephone Encounter (Signed)
Pt called in asking for a call back from [facility] regarding getting pain meds prescribed for leg pain. Pt states he got meds prescribed for the same issue awhile back. Pt states he cannot walk due to pain so attending an appointment would be difficult. Call back # [phone]

## 2022-07-18 NOTE — Telephone Encounter (Signed)
Jonathon Graham notified as instructed by telephone.  Patient states understanding.

## 2022-07-18 NOTE — Telephone Encounter (Signed)
Pt was seen UC in [place];pt cannot remember name of UC. on 07/17/22 for dizziness. Pt is not having dizziness today but pt said on 07/17/22 pt mowed and then developed Lt buttock pain.if laying pt does not have pain but if tries to walk pain level is 10. Pt said had similar pain 2 yrs ago and pt was given muscle relaxant and that helped. Pt said thinks this is sciatic nerve but pain is not running down lt leg. Pt said no transportation and no way to come to office. Pt request refill cyclobenzaprine to [facility] [place]. Pt request cb after note reviewed by Dr Diona Browner. Pt said he does not need his pain med.UC & ED precautions given and pt voiced understanding. Sending note to Dr Diona Browner and [place] pool.

## 2022-07-19 DIAGNOSIS — R9431 Abnormal electrocardiogram [ECG] [EKG]: Secondary | ICD-10-CM | POA: Diagnosis not present

## 2022-07-19 DIAGNOSIS — I440 Atrioventricular block, first degree: Secondary | ICD-10-CM | POA: Diagnosis not present

## 2022-07-19 DIAGNOSIS — M5432 Sciatica, left side: Secondary | ICD-10-CM | POA: Diagnosis not present

## 2022-07-19 DIAGNOSIS — I10 Essential (primary) hypertension: Secondary | ICD-10-CM | POA: Diagnosis not present

## 2022-07-19 DIAGNOSIS — R42 Dizziness and giddiness: Secondary | ICD-10-CM | POA: Diagnosis not present

## 2022-07-19 DIAGNOSIS — I4510 Unspecified right bundle-branch block: Secondary | ICD-10-CM | POA: Diagnosis not present

## 2022-07-27 DIAGNOSIS — J449 Chronic obstructive pulmonary disease, unspecified: Secondary | ICD-10-CM | POA: Diagnosis not present

## 2022-08-26 DIAGNOSIS — J449 Chronic obstructive pulmonary disease, unspecified: Secondary | ICD-10-CM | POA: Diagnosis not present

## 2022-08-27 ENCOUNTER — Other Ambulatory Visit: Payer: Self-pay | Admitting: Family Medicine

## 2022-08-27 DIAGNOSIS — E1142 Type 2 diabetes mellitus with diabetic polyneuropathy: Secondary | ICD-10-CM

## 2022-08-28 NOTE — Telephone Encounter (Signed)
Last office visit 07/04/2022 for DM.  Last refilled 03/28/2022 for #[id number] with no refills.  Next Appt: 10/04/22 for chronic pain management.

## 2022-09-13 ENCOUNTER — Other Ambulatory Visit: Payer: Self-pay | Admitting: Cardiovascular Disease

## 2022-09-13 DIAGNOSIS — E1169 Type 2 diabetes mellitus with other specified complication: Secondary | ICD-10-CM

## 2022-09-18 DIAGNOSIS — G4733 Obstructive sleep apnea (adult) (pediatric): Secondary | ICD-10-CM | POA: Diagnosis not present

## 2022-09-21 ENCOUNTER — Other Ambulatory Visit: Payer: Self-pay | Admitting: Family Medicine

## 2022-09-21 DIAGNOSIS — N401 Enlarged prostate with lower urinary tract symptoms: Secondary | ICD-10-CM

## 2022-09-21 DIAGNOSIS — E119 Type 2 diabetes mellitus without complications: Secondary | ICD-10-CM

## 2022-09-22 ENCOUNTER — Other Ambulatory Visit: Payer: Self-pay | Admitting: Family Medicine

## 2022-09-22 DIAGNOSIS — E119 Type 2 diabetes mellitus without complications: Secondary | ICD-10-CM

## 2022-09-25 ENCOUNTER — Ambulatory Visit (INDEPENDENT_AMBULATORY_CARE_PROVIDER_SITE_OTHER): Payer: 59

## 2022-09-25 ENCOUNTER — Telehealth: Payer: Self-pay | Admitting: Pulmonary Disease

## 2022-09-25 VITALS — Ht 67.0 in | Wt 288.0 lb

## 2022-09-25 DIAGNOSIS — Z0000 Encounter for general adult medical examination without abnormal findings: Secondary | ICD-10-CM

## 2022-09-25 NOTE — Patient Instructions (Addendum)
Jonathon Graham , Thank you for taking time to come for your Medicare Wellness Visit. I appreciate your ongoing commitment to your health goals. Please review the following plan we discussed and let me know if I can assist you in the future.   These are the goals we discussed:  Goals       Patient Stated      09/21/2020, I will maintain and continue medications as prescribed.      Patient Stated      Lose 40 pounds.      Weight (lb) < 200 lb (90.7 kg) (pt-stated)      I want to lose 40lbs.with diet changes.        This is a list of the screening recommended for you and due dates:  Health Maintenance  Topic Date Due   COVID-19 Vaccine (1) Never done   DTaP/Tdap/Td vaccine (1 - Tdap) Never done   Zoster (Shingles) Vaccine (1 of 2) Never done   Complete foot exam   10/04/2022   Flu Shot  12/28/2022   Hemoglobin A1C  01/02/2023   Yearly kidney function blood test for diabetes  01/06/2023   Yearly kidney health urinalysis for diabetes  01/06/2023   Eye exam for diabetics  02/02/2023   Medicare Annual Wellness Visit  09/25/2023   Cologuard (Stool DNA test)  11/13/2023   Hepatitis C Screening: USPSTF Recommendation to screen - Ages 38-79 yo.  Completed   HIV Screening  Completed   HPV Vaccine  Aged Out   Opioid Pain Medicine Management Opioids are powerful medicines that are used to treat moderate to severe pain. When used for short periods of time, they can help you to: Sleep better. Do better in physical or occupational therapy. Feel better in the first few days after an injury. Recover from surgery. Opioids should be taken with the supervision of a trained health care provider. They should be taken for the shortest period of time possible. This is because opioids can be addictive, and the longer you take opioids, the greater your risk of addiction. This addiction can also be called opioid use disorder. What are the risks? Using opioid pain medicines for longer than 3 days increases  your risk of side effects. Side effects include: Constipation. Nausea and vomiting. Breathing difficulties (respiratory depression). Drowsiness. Confusion. Opioid use disorder. Itching. Taking opioid pain medicine for a long period of time can affect your ability to do daily tasks. It also puts you at risk for: Motor vehicle crashes. Depression. Suicide. Heart attack. Overdose, which can be life-threatening. What is a pain treatment plan? A pain treatment plan is an agreement between you and your health care provider. Pain is unique to each person, and treatments vary depending on your condition. To manage your pain, you and your health care provider need to work together. To help you do this: Discuss the goals of your treatment, including how much pain you might expect to have and how you will manage the pain. Review the risks and benefits of taking opioid medicines. Remember that a good treatment plan uses more than one approach and minimizes the chance of side effects. Be honest about the amount of medicines you take and about any drug or alcohol use. Get pain medicine prescriptions from only one health care provider. Pain can be managed with many types of alternative treatments. Ask your health care provider to refer you to one or more specialists who can help you manage pain through: Physical or occupational  therapy. Counseling (cognitive behavioral therapy). Good nutrition. Biofeedback. Massage. Meditation. Non-opioid medicine. Following a gentle exercise program. How to use opioid pain medicine Taking medicine Take your pain medicine exactly as told by your health care provider. Take it only when you need it. If your pain gets less severe, you may take less than your prescribed dose if your health care provider approves. If you are not having pain, do nottake pain medicine unless your health care provider tells you to take it. If your pain is severe, do nottry to treat it  yourself by taking more pills than instructed on your prescription. Contact your health care provider for help. Write down the times when you take your pain medicine. It is easy to become confused while on pain medicine. Writing the time can help you avoid overdose. Take other over-the-counter or prescription medicines only as told by your health care provider. Keeping yourself and others safe  While you are taking opioid pain medicine: Do not drive, use machinery, or power tools. Do not sign legal documents. Do not drink alcohol. Do not take sleeping pills. Do not supervise children by yourself. Do not do activities that require climbing or being in high places. Do not go to a lake, river, ocean, spa, or swimming pool. Do not share your pain medicine with anyone. Keep pain medicine in a locked cabinet or in a secure area where pets and children cannot reach it. Stopping your use of opioids If you have been taking opioid medicine for more than a few weeks, you may need to slowly decrease (taper) how much you take until you stop completely. Tapering your use of opioids can decrease your risk of symptoms of withdrawal, such as: Pain and cramping in the abdomen. Nausea. Sweating. Sleepiness. Restlessness. Uncontrollable shaking (tremors). Cravings for the medicine. Do not attempt to taper your use of opioids on your own. Talk with your health care provider about how to do this. Your health care provider may prescribe a step-down schedule based on how much medicine you are taking and how long you have been taking it. Getting rid of leftover pills Do not save any leftover pills. Get rid of leftover pills safely by: Taking the medicine to a prescription take-back program. This is usually offered by the county or law enforcement. Bringing them to a pharmacy that has a drug disposal container. Flushing them down the toilet. Check the label or package insert of your medicine to see whether this  is safe to do. Throwing them out in the trash. Check the label or package insert of your medicine to see whether this is safe to do. If it is safe to throw it out, remove the medicine from the original container, put it into a sealable bag or container, and mix it with used coffee grounds, food scraps, dirt, or cat litter before putting it in the trash. Follow these instructions at home: Activity Do exercises as told by your health care provider. Avoid activities that make your pain worse. Return to your normal activities as told by your health care provider. Ask your health care provider what activities are safe for you. General instructions You may need to take these actions to prevent or treat constipation: Drink enough fluid to keep your urine pale yellow. Take over-the-counter or prescription medicines. Eat foods that are high in fiber, such as beans, whole grains, and fresh fruits and vegetables. Limit foods that are high in fat and processed sugars, such as fried or sweet foods.  Keep all follow-up visits. This is important. Where to find support If you have been taking opioids for a long time, you may benefit from receiving support for quitting from a local support group or counselor. Ask your health care provider for a referral to these resources in your area. Where to find more information Centers for Disease Control and Prevention (CDC): FootballExhibition.com.br [place] Food and Drug Administration (FDA): PumpkinSearch.com.ee Get help right away if: You may have taken too much of an opioid (overdosed). Common symptoms of an overdose: Your breathing is slower or more shallow than normal. You have a very slow heartbeat (pulse). You have slurred speech. You have nausea and vomiting. Your pupils become very small. You have other potential symptoms: You are very confused. You faint or feel like you will faint. You have cold, clammy skin. You have blue lips or fingernails. You have thoughts of harming  yourself or harming others. These symptoms may represent a serious problem that is an emergency. Do not wait to see if the symptoms will go away. Get medical help right away. Call your local emergency services (911 in the [place] Do not drive yourself to the hospital.  If you ever feel like you may hurt yourself or others, or have thoughts about taking your own life, get help right away. Go to your nearest emergency department or: Call your local emergency services (911 in the [place] Call the [facility] (346-808-8729 in the [place] Call a suicide crisis helpline, such as the National Suicide Prevention Lifeline at (862)552-6711 or 988 in the [place] This is open 24 hours a day in the [place] Text the Crisis Text Line at 847-001-7735 (in the [place] Summary Opioid medicines can help you manage moderate to severe pain for a short period of time. A pain treatment plan is an agreement between you and your health care provider. Discuss the goals of your treatment, including how much pain you might expect to have and how you will manage the pain. If you think that you or someone else may have taken too much of an opioid, get medical help right away. This information is not intended to replace advice given to you by your health care provider. Make sure you discuss any questions you have with your health care provider. Document Revised: 12/08/2020 Document Reviewed: 08/25/2020 Elsevier Patient Education  2023 [employer].   Advanced directives: Please bring a copy of your health care power of attorney and living will to the office to be added to your chart at your convenience.   Conditions/risks identified: none  Next appointment: Follow up in one year for your annual wellness visit. 09/26/2023 @ 1:00 telephone  Preventive Care 65 Years and Older, Male  Preventive care refers to lifestyle choices and visits with your health care provider that can promote health and wellness. What does  preventive care include? A yearly physical exam. This is also called an annual well check. Dental exams once or twice a year. Routine eye exams. Ask your health care provider how often you should have your eyes checked. Personal lifestyle choices, including: Daily care of your teeth and gums. Regular physical activity. Eating a healthy diet. Avoiding tobacco and drug use. Limiting alcohol use. Practicing safe sex. Taking low doses of aspirin every day. Taking vitamin and mineral supplements as recommended by your health care provider. What happens during an annual well check? The services and screenings done by your health care provider during your annual well check will depend on  your age, overall health, lifestyle risk factors, and family history of disease. Counseling  Your health care provider may ask you questions about your: Alcohol use. Tobacco use. Drug use. Emotional well-being. Home and relationship well-being. Sexual activity. Eating habits. History of falls. Memory and ability to understand (cognition). Work and work Astronomer. Screening  You may have the following tests or measurements: Height, weight, and BMI. Blood pressure. Lipid and cholesterol levels. These may be checked every 5 years, or more frequently if you are over 69 years old. Skin check. Lung cancer screening. You may have this screening every year starting at age 40 if you have a 30-pack-year history of smoking and currently smoke or have quit within the past 15 years. Fecal occult blood test (FOBT) of the stool. You may have this test every year starting at age 60. Flexible sigmoidoscopy or colonoscopy. You may have a sigmoidoscopy every 5 years or a colonoscopy every 10 years starting at age 63. Prostate cancer screening. Recommendations will vary depending on your family history and other risks. Hepatitis C blood test. Hepatitis B blood test. Sexually transmitted disease (STD) testing. Diabetes  screening. This is done by checking your blood sugar (glucose) after you have not eaten for a while (fasting). You may have this done every 1-3 years. Abdominal aortic aneurysm (AAA) screening. You may need this if you are a current or former smoker. Osteoporosis. You may be screened starting at age 54 if you are at high risk. Talk with your health care provider about your test results, treatment options, and if necessary, the need for more tests. Vaccines  Your health care provider may recommend certain vaccines, such as: Influenza vaccine. This is recommended every year. Tetanus, diphtheria, and acellular pertussis (Tdap, Td) vaccine. You may need a Td booster every 10 years. Zoster vaccine. You may need this after age 42. Pneumococcal 13-valent conjugate (PCV13) vaccine. One dose is recommended after age 29. Pneumococcal polysaccharide (PPSV23) vaccine. One dose is recommended after age 41. Talk to your health care provider about which screenings and vaccines you need and how often you need them. This information is not intended to replace advice given to you by your health care provider. Make sure you discuss any questions you have with your health care provider. Document Released: 06/11/2015 Document Revised: 02/02/2016 Document Reviewed: 03/16/2015 Elsevier Interactive Patient Education  2017 [employer].  Fall Prevention in the Home Falls can cause injuries. They can happen to people of all ages. There are many things you can do to make your home safe and to help prevent falls. What can I do on the outside of my home? Regularly fix the edges of walkways and driveways and fix any cracks. Remove anything that might make you trip as you walk through a door, such as a raised step or threshold. Trim any bushes or trees on the path to your home. Use bright outdoor lighting. Clear any walking paths of anything that might make someone trip, such as rocks or tools. Regularly check to see if  handrails are loose or broken. Make sure that both sides of any steps have handrails. Any raised decks and porches should have guardrails on the edges. Have any leaves, snow, or ice cleared regularly. Use sand or salt on walking paths during winter. Clean up any spills in your garage right away. This includes oil or grease spills. What can I do in the bathroom? Use night lights. Install grab bars by the toilet and in the tub and  shower. Do not use towel bars as grab bars. Use non-skid mats or decals in the tub or shower. If you need to sit down in the shower, use a plastic, non-slip stool. Keep the floor dry. Clean up any water that spills on the floor as soon as it happens. Remove soap buildup in the tub or shower regularly. Attach bath mats securely with double-sided non-slip rug tape. Do not have throw rugs and other things on the floor that can make you trip. What can I do in the bedroom? Use night lights. Make sure that you have a light by your bed that is easy to reach. Do not use any sheets or blankets that are too big for your bed. They should not hang down onto the floor. Have a firm chair that has side arms. You can use this for support while you get dressed. Do not have throw rugs and other things on the floor that can make you trip. What can I do in the kitchen? Clean up any spills right away. Avoid walking on wet floors. Keep items that you use a lot in easy-to-reach places. If you need to reach something above you, use a strong step stool that has a grab bar. Keep electrical cords out of the way. Do not use floor polish or wax that makes floors slippery. If you must use wax, use non-skid floor wax. Do not have throw rugs and other things on the floor that can make you trip. What can I do with my stairs? Do not leave any items on the stairs. Make sure that there are handrails on both sides of the stairs and use them. Fix handrails that are broken or loose. Make sure that  handrails are as long as the stairways. Check any carpeting to make sure that it is firmly attached to the stairs. Fix any carpet that is loose or worn. Avoid having throw rugs at the top or bottom of the stairs. If you do have throw rugs, attach them to the floor with carpet tape. Make sure that you have a light switch at the top of the stairs and the bottom of the stairs. If you do not have them, ask someone to add them for you. What else can I do to help prevent falls? Wear shoes that: Do not have high heels. Have rubber bottoms. Are comfortable and fit you well. Are closed at the toe. Do not wear sandals. If you use a stepladder: Make sure that it is fully opened. Do not climb a closed stepladder. Make sure that both sides of the stepladder are locked into place. Ask someone to hold it for you, if possible. Clearly mark and make sure that you can see: Any grab bars or handrails. First and last steps. Where the edge of each step is. Use tools that help you move around (mobility aids) if they are needed. These include: Canes. Walkers. Scooters. Crutches. Turn on the lights when you go into a dark area. Replace any light bulbs as soon as they burn out. Set up your furniture so you have a clear path. Avoid moving your furniture around. If any of your floors are uneven, fix them. If there are any pets around you, be aware of where they are. Review your medicines with your doctor. Some medicines can make you feel dizzy. This can increase your chance of falling. Ask your doctor what other things that you can do to help prevent falls. This information is not intended to  replace advice given to you by your health care provider. Make sure you discuss any questions you have with your health care provider. Document Released: 03/11/2009 Document Revised: 10/21/2015 Document Reviewed: 06/19/2014 Elsevier Interactive Patient Education  2017 [employer].

## 2022-09-25 NOTE — Telephone Encounter (Signed)
[facility] needs Prescription and OV notes sent  EXT: [phone]

## 2022-09-25 NOTE — Progress Notes (Signed)
I connected with  Jonathon Graham on 09/25/22 by a audio enabled telemedicine application and verified that I am speaking with the correct person using two identifiers.  Patient Location: Home  Provider Location: Home Office  I discussed the limitations of evaluation and management by telemedicine. The patient expressed understanding and agreed to proceed.  Subjective:   Jonathon Graham is a 67 y.o. male who presents for Medicare Annual/Subsequent preventive examination.  Review of Systems      Cardiac Risk Factors include: advanced age (>51men, >68 women);hypertension;diabetes mellitus;male gender     Objective:    Today's Vitals   09/25/22 1257  Weight: 288 lb (130.6 kg)  Height: 5\' 7"  (1.702 m)  PainSc: 8    Body mass index is 45.11 kg/m.     09/25/2022    1:15 PM 09/22/2021   12:51 PM 09/21/2020   12:02 PM 05/26/2020   11:12 AM 03/24/2017    7:39 PM 03/24/2017    2:56 PM 03/15/2017    5:38 PM  Advanced Directives  Does Patient Have a Medical Advance Directive? Yes Yes Yes No No No No  Type of Estate agent of [place];Living will Healthcare Power of [place];Living will Healthcare Power of [place];Living will      Does patient want to make changes to medical advance directive?  No - Patient declined       Copy of Healthcare Power of Attorney in Chart? No - copy requested No - copy requested No - copy requested      Would patient like information on creating a medical advance directive?  No - Patient declined No - Patient declined Yes (MAU/Ambulatory/Procedural Areas - Information given) No - Patient declined  Yes (ED - Information included in AVS)    Current Medications (verified) Outpatient Encounter Medications as of 09/25/2022  Medication Sig   albuterol (PROAIR HFA) 108 (90 Base) MCG/ACT inhaler Inhale 1-2 puffs into the lungs every 4 (four) hours as needed for wheezing or shortness of breath.   albuterol (PROVENTIL) (2.5 MG/3ML) 0.083% nebulizer  solution Take 3 mLs (2.5 mg total) by nebulization every 6 (six) hours as needed for wheezing or shortness of breath.   aspirin 81 MG tablet Take 243 mg by mouth daily. Takes 4 81mg  aspirin daily.   atorvastatin (LIPITOR) 40 MG tablet Take 1 tablet by mouth once daily   betamethasone valerate ointment (VALISONE) 0.1 % Apply 1 application topically 2 (two) times daily. For up to 10 days.   blood glucose meter kit and supplies KIT Dispense based on patient and insurance preference. Use up to four times daily as directed. (FOR ICD-9 250.00, 250.01).   Cholecalciferol (VITAMIN D3) 1.25 MG (50000 UT) CAPS TAKE 1 CAPSULE BY MOUTH  ONCE A WEEK   clotrimazole (LOTRIMIN) 1 % cream Apply 1 application 2 (two) times daily topically. For 7 days   Continuous Blood Gluc Sensor (FREESTYLE LIBRE 3 SENSOR) MISC 1 each by Does not apply route every 14 (fourteen) days. Place 1 sensor on the skin every 14 days. Use to check glucose continuously   cyclobenzaprine (FLEXERIL) 10 MG tablet Take 1 tablet (10 mg total) by mouth at bedtime as needed for muscle spasms.   DULoxetine (CYMBALTA) 60 MG capsule Take 1 capsule (60 mg total) by mouth daily.   fluticasone-salmeterol (ADVAIR) 250-50 MCG/ACT AEPB Inhale 1 puff into the lungs every 12 (twelve) hours.   furosemide (LASIX) 40 MG tablet TAKE 1 TABLET BY MOUTH ONCE DAILY AS NEEDED   gabapentin (  NEURONTIN) 300 MG capsule TAKE 1 CAPSULE BY MOUTH THREE TIMES DAILY   ibuprofen (ADVIL) 800 MG tablet Take 1 tablet (800 mg total) by mouth every 8 (eight) hours as needed.   metFORMIN (GLUCOPHAGE) 1000 MG tablet TAKE 1 TABLET BY MOUTH TWICE DAILY WITH MEALS   metoprolol tartrate (LOPRESSOR) 25 MG tablet Take 0.5 tablets (12.5 mg total) by mouth 2 (two) times daily.   ondansetron (ZOFRAN) 8 MG tablet Take 1 tablet (8 mg total) by mouth every 8 (eight) hours as needed for nausea or vomiting.   oxyCODONE-acetaminophen (PERCOCET/ROXICET) 5-325 MG tablet Take 0.5 tablets by mouth 2 (two)  times daily as needed for severe pain.   potassium chloride SA (KLOR-CON) 20 MEQ tablet Take 1 tablet (20 mEq total) by mouth daily. (Patient taking differently: Take 20 mEq by mouth once a week.)   Semaglutide, 1 MG/DOSE, 4 MG/3ML SOPN Inject 1 mg as directed once a week.   tamsulosin (FLOMAX) 0.4 MG CAPS capsule Take 1 capsule by mouth once daily   No facility-administered encounter medications on file as of 09/25/2022.    Allergies (verified) Penicillins   History: Past Medical History:  Diagnosis Date   Arthritis    Benign prostatic hyperplasia (BPH) with urinary urgency    nodular   COPD (chronic obstructive pulmonary disease) (HCC)    Depression    Diabetes mellitus without complication (HCC)    Essential hypertension    Fibromyalgia, primary    Hyperlipidemia    Hypertension    Obesity    History reviewed. No pertinent surgical history. Family History  Problem Relation Age of Onset   Heart disease Father    Diabetes Mellitus II Sister    Myasthenia gravis Sister    Fibrocystic breast disease Sister    Hypertension Sister    Cancer Sister 68        sinus cancer   Diabetes Mellitus II Brother    Heart disease Mother    Social History   Socioeconomic History   Marital status: Married    Spouse name: Not on file   Number of children: Not on file   Years of education: Not on file   Highest education level: Not on file  Occupational History   Occupation: retired  Tobacco Use   Smoking status: Former    Types: Cigarettes    Quit date: 03/28/2017    Years since quitting: 5.4   Smokeless tobacco: Never  Substance and Sexual Activity   Alcohol use: No    Alcohol/week: 0.0 standard drinks of alcohol   Drug use: No   Sexual activity: Yes  Other Topics Concern   Not on file  Social History Narrative   Not on file   Social Determinants of Health   Financial Resource Strain: Low Risk  (09/25/2022)   Overall Financial Resource Strain (CARDIA)    Difficulty of  Paying Living Expenses: Not hard at all  Food Insecurity: No Food Insecurity (09/25/2022)   Hunger Vital Sign    Worried About Running Out of Food in the Last Year: Never true    Ran Out of Food in the Last Year: Never true  Transportation Needs: No Transportation Needs (09/25/2022)   PRAPARE - Administrator, Civil Service (Medical): No    Lack of Transportation (Non-Medical): No  Physical Activity: Sufficiently Active (09/25/2022)   Exercise Vital Sign    Days of Exercise per Week: 2 days    Minutes of Exercise per Session: 90 min  Stress: No Stress Concern Present (09/25/2022)   [employer] of Occupational Health - Occupational Stress Questionnaire    Feeling of Stress : Not at all  Social Connections: Moderately Integrated (09/25/2022)   Social Connection and Isolation Panel [NHANES]    Frequency of Communication with Friends and Family: Once a week    Frequency of Social Gatherings with Friends and Family: Once a week    Attends Religious Services: More than 4 times per year    Active Member of [employer] or Organizations: Yes    Attends Engineer, structural: More than 4 times per year    Marital Status: Married    Tobacco Counseling Counseling given: Not Answered   Clinical Intake:  Pre-visit preparation completed: Yes  Pain : 0-10 Pain Score: 8  Pain Type: Chronic pain Pain Location: Back Pain Orientation: Lower, Medial Pain Descriptors / Indicators: Aching Pain Onset: More than a month ago Pain Frequency: Constant     Nutritional Risks: None Diabetes: Yes CBG done?: No Did pt. bring in CBG monitor from home?: No  How often do you need to have someone help you when you read instructions, pamphlets, or other written materials from your doctor or pharmacy?: 1 - Never  Diabetic? Nutrition Risk Assessment:  Has the patient had any N/V/D within the last 2 months?  No  Does the patient have any non-healing wounds?  No  Has the patient had any  unintentional weight loss or weight gain?  No   Diabetes:  Is the patient diabetic?  Yes  If diabetic, was a CBG obtained today?  Yes , 108 per pt Did the patient bring in their glucometer from home?  No  How often do you monitor your CBG's? BID.   Financial Strains and Diabetes Management:  Are you having any financial strains with the device, your supplies or your medication? No .  Does the patient want to be seen by Chronic Care Management for management of their diabetes?  No  Would the patient like to be referred to a Nutritionist or for Diabetic Management?   Sees nutritionist yearly.  Diabetic Exams:  Diabetic Eye Exam: Completed 02/01/2022 [facility] Diabetic Foot Exam: Completed 10/03/21 PCP    Interpreter Needed?: No  Information entered by :: C.Jemma Rasp LPN   Activities of Daily Living    09/25/2022    1:15 PM  In your present state of health, do you have any difficulty performing the following activities:  Hearing? 0  Vision? 1  Difficulty concentrating or making decisions? 0  Walking or climbing stairs? 1  Comment leg and back pain  Dressing or bathing? 1  Comment wife assists  Doing errands, shopping? 0  Preparing Food and eating ? N  Using the Toilet? N  In the past six months, have you accidently leaked urine? Y  Comment occasional  Do you have problems with loss of bowel control? N  Managing your Medications? N  Managing your Finances? N  Comment wife assists  Housekeeping or managing your Housekeeping? N    Patient Care Team: Excell Seltzer, MD as PCP - General (Family Medicine)  Indicate any recent Medical Services you may have received from other than [facility] providers in the past year (date may be approximate).     Assessment:   This is a routine wellness examination for Jonathon Graham.  Hearing/Vision screen Hearing Screening - Comments:: No aid Vision Screening - Comments:: Glasses - [facility]  Dietary issues and exercise activities  discussed:  Current Exercise Habits: Structured exercise class, Type of exercise: strength training/weights ([employer]), Time (Minutes): > 60, Frequency (Times/Week): 2, Weekly Exercise (Minutes/Week): 0, Intensity: Moderate, Exercise limited by: orthopedic condition(s) (back and leg pain)   Goals Addressed             This Visit's Progress    Patient Stated       Lose 40 pounds.       Depression Screen    09/25/2022    1:12 PM 07/04/2022    9:08 AM 09/22/2021   12:45 PM 09/21/2020   12:04 PM 05/26/2020   11:14 AM 07/12/2018    9:33 AM 03/14/2016    6:54 PM  PHQ 2/9 Scores  PHQ - 2 Score 0 2 0 4 2 6 1   PHQ- 9 Score  5  4 8 22      Fall Risk    09/25/2022    1:04 PM 07/04/2022    9:08 AM 09/22/2021   12:49 PM 09/21/2020   12:03 PM 05/26/2020   11:14 AM  Fall Risk   Falls in the past year? 0 0 1 0 0  Number falls in past yr: 0 0 0 0   Injury with Fall? 0 0 0 0   Comment   No injury or medical attention needed    Risk for fall due to : No Fall Risks No Fall Risks No Fall Risks Medication side effect   Follow up Falls prevention discussed;Falls evaluation completed Falls evaluation completed  Falls evaluation completed;Falls prevention discussed     FALL RISK PREVENTION PERTAINING TO THE HOME:  Any stairs in or around the home? No  If so, are there any without handrails? No  Home free of loose throw rugs in walkways, pet beds, electrical cords, etc? Yes  Adequate lighting in your home to reduce risk of falls? Yes   ASSISTIVE DEVICES UTILIZED TO PREVENT FALLS:  Life alert? No  Use of a cane, walker or w/c? Yes  Grab bars in the bathroom? Yes  Shower chair or bench in shower? No  Elevated toilet seat or a handicapped toilet? Yes    Cognitive Function:    09/21/2020   12:06 PM  MMSE - Mini Mental State Exam  Not completed: Refused        09/25/2022    1:17 PM 09/22/2021   12:52 PM  6CIT Screen  What Year? 0 points 0 points  What month? 0 points 0 points  What time? 0  points 0 points  Count back from 20 0 points 0 points  Months in reverse 0 points 0 points  Repeat phrase 0 points 0 points  Total Score 0 points 0 points    Immunizations Immunization History  Administered Date(s) Administered   Influenza,inj,Quad PF,6+ Mos 03/26/2017, 03/27/2018, 03/07/2019   Pneumococcal Polysaccharide-23 03/17/2017    TDAP status: Due, Education has been provided regarding the importance of this vaccine. Advised may receive this vaccine at local pharmacy or Health Dept. Aware to provide a copy of the vaccination record if obtained from local pharmacy or Health Dept. Verbalized acceptance and understanding.  Flu Vaccine status: Declined, Education has been provided regarding the importance of this vaccine but patient still declined. Advised may receive this vaccine at local pharmacy or Health Dept. Aware to provide a copy of the vaccination record if obtained from local pharmacy or Health Dept. Verbalized acceptance and understanding.  Pneumococcal vaccine status: Declined,  Education has been provided regarding the importance of this vaccine  but patient still declined. Advised may receive this vaccine at local pharmacy or Health Dept. Aware to provide a copy of the vaccination record if obtained from local pharmacy or Health Dept. Verbalized acceptance and understanding.   Covid-19 vaccine status: Declined, Education has been provided regarding the importance of this vaccine but patient still declined. Advised may receive this vaccine at local pharmacy or Health Dept.or vaccine clinic. Aware to provide a copy of the vaccination record if obtained from local pharmacy or Health Dept. Verbalized acceptance and understanding.  Qualifies for Shingles Vaccine? Yes   Zostavax completed No   Shingrix Completed?: No.    Education has been provided regarding the importance of this vaccine. Patient has been advised to call insurance company to determine out of pocket expense if they  have not yet received this vaccine. Advised may also receive vaccine at local pharmacy or Health Dept. Verbalized acceptance and understanding.  Screening Tests Health Maintenance  Topic Date Due   COVID-19 Vaccine (1) Never done   DTaP/Tdap/Td (1 - Tdap) Never done   Zoster Vaccines- Shingrix (1 of 2) Never done   FOOT EXAM  10/04/2022   INFLUENZA VACCINE  12/28/2022   HEMOGLOBIN A1C  01/02/2023   Diabetic kidney evaluation - eGFR measurement  01/06/2023   Diabetic kidney evaluation - Urine ACR  01/06/2023   OPHTHALMOLOGY EXAM  02/02/2023   Medicare Annual Wellness (AWV)  09/25/2023   Fecal DNA (Cologuard)  11/13/2023   Hepatitis C Screening  Completed   HIV Screening  Completed   HPV VACCINES  Aged Out    Health Maintenance  Health Maintenance Due  Topic Date Due   COVID-19 Vaccine (1) Never done   DTaP/Tdap/Td (1 - Tdap) Never done   Zoster Vaccines- Shingrix (1 of 2) Never done    Colorectal cancer screening: Type of screening: Cologuard. Completed 0/17/2022. Repeat every 3 years  Lung Cancer Screening: (Low Dose CT Chest recommended if Age 37-80 years, 30 pack-year currently smoking OR have quit w/in 15years.) does not qualify.   Lung Cancer Screening Referral: no  Additional Screening:  Hepatitis C Screening: does qualify; Completed 05/26/2015  Vision Screening: Recommended annual ophthalmology exams for early detection of glaucoma and other disorders of the eye. Is the patient up to date with their annual eye exam?  Yes  Who is the provider or what is the name of the office in which the patient attends annual eye exams? [place]  If pt is not established with a provider, would they like to be referred to a provider to establish care? No .   Dental Screening: Recommended annual dental exams for proper oral hygiene  Community Resource Referral / Chronic Care Management: CRR required this visit?  No   CCM required this visit?  No      Plan:     I have  personally reviewed and noted the following in the patient's chart:   Medical and social history Use of alcohol, tobacco or illicit drugs  Current medications and supplements including opioid prescriptions. Patient is currently taking opioid prescriptions. Information provided to patient regarding non-opioid alternatives. Patient advised to discuss non-opioid treatment plan with their provider. Functional ability and status Nutritional status Physical activity Advanced directives List of other physicians Hospitalizations, surgeries, and ER visits in previous 12 months Vitals Screenings to include cognitive, depression, and falls Referrals and appointments  In addition, I have reviewed and discussed with patient certain preventive protocols, quality metrics, and best practice recommendations. A written personalized  care plan for preventive services as well as general preventive health recommendations were provided to patient.     Maryan Puls, LPN   [id number]   Nurse Notes: Vaccinations: declines all Influenza vaccine: recommend every Fall Pneumococcal vaccine: recommend once per lifetime Prevnar-20 Tdap vaccine: recommend every 10 years Shingles vaccine: recommend Shingrix which is 2 doses 2-6 months apart and over 90% effective     Covid-19: recommend 2 doses one month apart with a booster 6 months later

## 2022-09-26 DIAGNOSIS — J449 Chronic obstructive pulmonary disease, unspecified: Secondary | ICD-10-CM | POA: Diagnosis not present

## 2022-10-04 ENCOUNTER — Encounter: Payer: Self-pay | Admitting: Family Medicine

## 2022-10-04 ENCOUNTER — Ambulatory Visit (INDEPENDENT_AMBULATORY_CARE_PROVIDER_SITE_OTHER): Payer: 59 | Admitting: Family Medicine

## 2022-10-04 VITALS — BP 122/82 | HR 71 | Temp 97.2°F | Ht 67.0 in | Wt 286.0 lb

## 2022-10-04 DIAGNOSIS — M25552 Pain in left hip: Secondary | ICD-10-CM

## 2022-10-04 DIAGNOSIS — Z7984 Long term (current) use of oral hypoglycemic drugs: Secondary | ICD-10-CM | POA: Diagnosis not present

## 2022-10-04 DIAGNOSIS — Z6841 Body Mass Index (BMI) 40.0 and over, adult: Secondary | ICD-10-CM

## 2022-10-04 DIAGNOSIS — M48061 Spinal stenosis, lumbar region without neurogenic claudication: Secondary | ICD-10-CM | POA: Diagnosis not present

## 2022-10-04 DIAGNOSIS — Z7985 Long-term (current) use of injectable non-insulin antidiabetic drugs: Secondary | ICD-10-CM | POA: Diagnosis not present

## 2022-10-04 DIAGNOSIS — M5442 Lumbago with sciatica, left side: Secondary | ICD-10-CM

## 2022-10-04 DIAGNOSIS — M5441 Lumbago with sciatica, right side: Secondary | ICD-10-CM

## 2022-10-04 DIAGNOSIS — G8929 Other chronic pain: Secondary | ICD-10-CM

## 2022-10-04 DIAGNOSIS — G894 Chronic pain syndrome: Secondary | ICD-10-CM

## 2022-10-04 DIAGNOSIS — F331 Major depressive disorder, recurrent, moderate: Secondary | ICD-10-CM

## 2022-10-04 DIAGNOSIS — E1142 Type 2 diabetes mellitus with diabetic polyneuropathy: Secondary | ICD-10-CM

## 2022-10-04 LAB — HM DIABETES FOOT EXAM

## 2022-10-04 MED ORDER — SEMAGLUTIDE (2 MG/DOSE) 8 MG/3ML ~~LOC~~ SOPN: 2.0000 mg | PEN_INJECTOR | SUBCUTANEOUS | 3 refills | Status: DC

## 2022-10-04 MED ORDER — OXYCODONE-ACETAMINOPHEN 5-325 MG PO TABS: 0.5000 | ORAL_TABLET | Freq: Two times a day (BID) | ORAL | 0 refills | Status: DC | PRN

## 2022-10-04 MED ORDER — IBUPROFEN 800 MG PO TABS: 800.0000 mg | ORAL_TABLET | Freq: Three times a day (TID) | ORAL | 0 refills | Status: AC | PRN

## 2022-10-04 NOTE — Assessment & Plan Note (Signed)
Indication for chronic opioid:  chronic low back pain Medication and dose:  oxycodone 5/325 mg  0.5 to 1 tablet daily. # pills per  THREE month: 30 Last UDS date: 07/04/22 Opioid Treatment Agreement signed (Y/N):  Y 07/04/22 Opioid Treatment Agreement last reviewed with patient:   yes NCCSRS reviewed this encounter (include red flags): Yes  no red flags.   PDMP reviewed during this encounter.

## 2022-10-04 NOTE — Assessment & Plan Note (Signed)
Chronic, well-controlled with weight loss, low-carb diet, regular exercise.  Will increase semaglutide to 2 mg injected weekly Metformin 1000 mg p.o. daily.  At next office visit with A1c if continues to be lower, we can consider decreasing metformin.

## 2022-10-04 NOTE — Assessment & Plan Note (Signed)
Chronic, improving He has lost over 40 pounds on semaglutide.  Increase this further given plateaued weight loss to 2 mg weekly. Encouraged exercise, weight loss, healthy eating habits.

## 2022-10-04 NOTE — Patient Instructions (Signed)
Keep up with regular exercise and weight training.  Increase semaglutide to 2 mg weekly.. call if any side effects.

## 2022-10-04 NOTE — Progress Notes (Signed)
Patient ID: Jonathon Graham, male    DOB: 1955/08/13, 67 y.o.   MRN: [id number]  This visit was conducted in person.  BP 122/82 (BP Location: Left Arm, Patient Position: Sitting, Cuff Size: Large)   Pulse 71   Temp (!) 97.2 F (36.2 C) (Temporal)   Ht 5\' 7"  (1.702 m)   Wt 286 lb (129.7 kg)   SpO2 96%   BMI 44.79 kg/m    CC:  Chief Complaint  Patient presents with   Pain Management    Subjective:   HPI: Jonathon Graham is a 67 y.o. male presenting on 10/04/2022 for Pain Management  Diabetes:  Improved control, from last A1C at 7, eating less overall. Lab Results  Component Value Date   HGBA1C 6.6 (A) 07/04/2022  On Ozempic 1 mg weekly and metformin 1000 mg p.o. daily.   He has lost 40 lbs in last year on semaglutide. Using medications without difficulties: none Hypoglycemic episodes:none Hyperglycemic episodes: none Feet problems: none Blood Sugars averaging: FBS 108 eye exam within last year: yes  Wt Readings from Last 3 Encounters:  10/04/22 286 lb (129.7 kg)  09/25/22 288 lb (130.6 kg)  07/04/22 288 lb 6 oz (130.8 kg)   He has joined [employer] and [employer]..  doing swimming  now, silver sneakers.  Indication for chronic opioid:  chronic low back pain Medication and dose:  oxycodone 5/325 mg  0.5 to 1 tablet daily... usually using few times a week.  Using ibuprofen off and on, using gabapentin 300 mg three times daily. # pills per  THREE month: 30 Last UDS date: 07/04/22 Opioid Treatment Agreement signed (Y/N):  Y 07/04/22 Opioid Treatment Agreement last reviewed with patient:   yes NCCSRS reviewed this encounter (include red flags): Yes  no red flags.   PDMP reviewed during this encounter.   He  is interested in restarting counseling with Lennice Sites,   He is has gone through a lot of lossess. PHQ9: 6  GAD7: 6  Relevant past medical, surgical, family and social history reviewed and updated as indicated. Interim medical history since our last visit  reviewed. Allergies and medications reviewed and updated. Outpatient Medications Prior to Visit  Medication Sig Dispense Refill   albuterol (PROAIR HFA) 108 (90 Base) MCG/ACT inhaler Inhale 1-2 puffs into the lungs every 4 (four) hours as needed for wheezing or shortness of breath. 18 each 5   albuterol (PROVENTIL) (2.5 MG/3ML) 0.083% nebulizer solution Take 3 mLs (2.5 mg total) by nebulization every 6 (six) hours as needed for wheezing or shortness of breath. 360 mL 6   aspirin 81 MG tablet Take 243 mg by mouth daily. Takes 4 81mg  aspirin daily.     atorvastatin (LIPITOR) 40 MG tablet Take 1 tablet by mouth once daily 90 tablet 0   betamethasone valerate ointment (VALISONE) 0.1 % Apply 1 application topically 2 (two) times daily. For up to 10 days. 30 g 0   blood glucose meter kit and supplies KIT Dispense based on patient and insurance preference. Use up to four times daily as directed. (FOR ICD-9 250.00, 250.01). 1 each 0   Cholecalciferol (VITAMIN D3) 1.25 MG (50000 UT) CAPS TAKE 1 CAPSULE BY MOUTH  ONCE A WEEK 12 capsule 0   clotrimazole (LOTRIMIN) 1 % cream Apply 1 application 2 (two) times daily topically. For 7 days 28 g 0   Continuous Blood Gluc Sensor (FREESTYLE LIBRE 3 SENSOR) MISC 1 each by Does not apply route every 14 (  fourteen) days. Place 1 sensor on the skin every 14 days. Use to check glucose continuously 2 each 5   cyclobenzaprine (FLEXERIL) 10 MG tablet Take 1 tablet (10 mg total) by mouth at bedtime as needed for muscle spasms. 15 tablet 0   DULoxetine (CYMBALTA) 60 MG capsule Take 1 capsule (60 mg total) by mouth daily. 90 capsule 1   fluticasone-salmeterol (ADVAIR) 250-50 MCG/ACT AEPB Inhale 1 puff into the lungs every 12 (twelve) hours. 60 each 11   furosemide (LASIX) 40 MG tablet TAKE 1 TABLET BY MOUTH ONCE DAILY AS NEEDED 90 tablet 1   gabapentin (NEURONTIN) 300 MG capsule TAKE 1 CAPSULE BY MOUTH THREE TIMES DAILY 270 capsule 0   metFORMIN (GLUCOPHAGE) 1000 MG tablet TAKE  1 TABLET BY MOUTH TWICE DAILY WITH MEALS 180 tablet 1   metoprolol tartrate (LOPRESSOR) 25 MG tablet Take 0.5 tablets (12.5 mg total) by mouth 2 (two) times daily.     ondansetron (ZOFRAN) 8 MG tablet Take 1 tablet (8 mg total) by mouth every 8 (eight) hours as needed for nausea or vomiting. 12 tablet 0   potassium chloride SA (KLOR-CON) 20 MEQ tablet Take 1 tablet (20 mEq total) by mouth daily. (Patient taking differently: Take 20 mEq by mouth once a week.) 90 tablet 0   tamsulosin (FLOMAX) 0.4 MG CAPS capsule Take 1 capsule by mouth once daily 90 capsule 1   ibuprofen (ADVIL) 800 MG tablet Take 1 tablet (800 mg total) by mouth every 8 (eight) hours as needed. 60 tablet 0   oxyCODONE-acetaminophen (PERCOCET/ROXICET) 5-325 MG tablet Take 0.5 tablets by mouth 2 (two) times daily as needed for severe pain. 30 tablet 0   Semaglutide, 1 MG/DOSE, 4 MG/3ML SOPN Inject 1 mg as directed once a week. 9 mL 3   No facility-administered medications prior to visit.     Per HPI unless specifically indicated in ROS section below Review of Systems  Constitutional:  Negative for fatigue and fever.  HENT:  Negative for ear pain.   Eyes:  Negative for pain.  Respiratory:  Negative for cough and shortness of breath.   Cardiovascular:  Negative for chest pain, palpitations and leg swelling.  Gastrointestinal:  Negative for abdominal pain.  Genitourinary:  Negative for dysuria.  Musculoskeletal:  Negative for arthralgias.  Neurological:  Negative for syncope, light-headedness and headaches.  Psychiatric/Behavioral:  Negative for dysphoric mood.    Objective:  BP 122/82 (BP Location: Left Arm, Patient Position: Sitting, Cuff Size: Large)   Pulse 71   Temp (!) 97.2 F (36.2 C) (Temporal)   Ht 5\' 7"  (1.702 m)   Wt 286 lb (129.7 kg)   SpO2 96%   BMI 44.79 kg/m   Wt Readings from Last 3 Encounters:  10/04/22 286 lb (129.7 kg)  09/25/22 288 lb (130.6 kg)  07/04/22 288 lb 6 oz (130.8 kg)      Physical  Exam Constitutional:      Appearance: He is well-developed. He is obese.  HENT:     Head: Normocephalic.     Right Ear: Hearing normal.     Left Ear: Hearing normal.     Nose: Nose normal.  Neck:     Thyroid: No thyroid mass or thyromegaly.     Vascular: No carotid bruit.     Trachea: Trachea normal.  Cardiovascular:     Rate and Rhythm: Normal rate and regular rhythm.     Pulses: Normal pulses.     Heart sounds: Heart sounds not  distant. No murmur heard.    No friction rub. No gallop.     Comments: No peripheral edema Pulmonary:     Effort: Pulmonary effort is normal. No respiratory distress.     Breath sounds: Normal breath sounds.  Skin:    General: Skin is warm and dry.     Findings: No rash.  Psychiatric:        Speech: Speech normal.        Behavior: Behavior normal.        Thought Content: Thought content normal.   Diabetic foot exam: Normal inspection No skin breakdown No calluses  Normal DP pulses Normal sensation to light touch and monofilament Nails normal     Results for orders placed or performed in visit on 10/04/22  HM DIABETES FOOT EXAM  Result Value Ref Range   HM Diabetic Foot Exam done     Assessment and Plan  Type 2 diabetes, controlled, with peripheral neuropathy (HCC) Assessment & Plan: Chronic, well-controlled with weight loss, low-carb diet, regular exercise.  Will increase semaglutide to 2 mg injected weekly Metformin 1000 mg p.o. daily.  At next office visit with A1c if continues to be lower, we can consider decreasing metformin.   Chronic pain syndrome Assessment & Plan: Indication for chronic opioid:  chronic low back pain Medication and dose:  oxycodone 5/325 mg  0.5 to 1 tablet daily. # pills per  THREE month: 30 Last UDS date: 07/04/22 Opioid Treatment Agreement signed (Y/N):  Y 07/04/22 Opioid Treatment Agreement last reviewed with patient:   yes NCCSRS reviewed this encounter (include red flags): Yes  no red flags.   PDMP  reviewed during this encounter.   Chronic midline low back pain, unspecified whether sciatica present  Left hip pain -     oxyCODONE-Acetaminophen; Take 0.5 tablets by mouth 2 (two) times daily as needed for severe pain.  Dispense: 30 tablet; Refill: 0  Spinal stenosis of lumbar region, unspecified whether neurogenic claudication present Assessment & Plan: Chronic, Continue pain control with ibuprofen 800 mg as needed for pain and inflammation and gabapentin 300 mg 3 times daily.  Orders: -     oxyCODONE-Acetaminophen; Take 0.5 tablets by mouth 2 (two) times daily as needed for severe pain.  Dispense: 30 tablet; Refill: 0  Chronic bilateral low back pain with bilateral sciatica -     Ibuprofen; Take 1 tablet (800 mg total) by mouth every 8 (eight) hours as needed.  Dispense: 60 tablet; Refill: 0  MDD (major depressive disorder), recurrent episode, moderate (HCC) Assessment & Plan: Chronic, poor control  He was doing well approximately 2 years ago with counseling.  We will refer him back to see Shela Leff psychologist.  Orders: -     Ambulatory referral to Psychology  Morbid obesity [facility]) Assessment & Plan: Chronic, improving He has lost over 40 pounds on semaglutide.  Increase this further given plateaued weight loss to 2 mg weekly. Encouraged exercise, weight loss, healthy eating habits.    Other orders -     Semaglutide (2 MG/DOSE); Inject 2 mg as directed once a week.  Dispense: 9 mL; Refill: 3     Return in about 3 months (around 01/04/2023) for phone AMW,  fasting labs then CPE with me.   Kerby Nora, MD

## 2022-10-04 NOTE — Telephone Encounter (Signed)
Pt has not been seen since 11/2021. Called [employer] and spoke with one of the representatives that answered the phone. When trying to answer the security questions that she was asking, when she was asking about pt's current insurance, I told her that it has been close to a year since we have seen pt and I told her the insurance info that we were showing in pt's chart and she said that was not the correct insurance.  I told her that we were returning their phone call and she still wouldn't proceed further due to not having the correct insurance info.

## 2022-10-04 NOTE — Assessment & Plan Note (Signed)
Chronic, Continue pain control with ibuprofen 800 mg as needed for pain and inflammation and gabapentin 300 mg 3 times daily.

## 2022-10-04 NOTE — Assessment & Plan Note (Signed)
Chronic, poor control  He was doing well approximately 2 years ago with counseling.  We will refer him back to see Shela Leff psychologist.

## 2022-10-26 DIAGNOSIS — J449 Chronic obstructive pulmonary disease, unspecified: Secondary | ICD-10-CM | POA: Diagnosis not present

## 2022-11-08 ENCOUNTER — Ambulatory Visit (INDEPENDENT_AMBULATORY_CARE_PROVIDER_SITE_OTHER): Payer: 59 | Admitting: Psychology

## 2022-11-08 DIAGNOSIS — F322 Major depressive disorder, single episode, severe without psychotic features: Secondary | ICD-10-CM | POA: Diagnosis not present

## 2022-11-08 NOTE — Progress Notes (Signed)
Comprehensive Clinical Assessment (CCA) Note  11/08/2022 Jonathon Graham [id number]  Time Spent: 9:05  am - 9:48  am: 43 Minutes  Chief Complaint: No chief complaint on file.  Visit Diagnosis: F32.2   Guardian/Payee:  self    Paperwork requested: No   Reason for Visit /Presenting Problem: Depression and anxiety.   Mental Status Exam: Appearance:   Casual     Behavior:  Appropriate  Motor:  Normal  Speech/Language:   Normal Rate  Affect:  Congruent  Mood:  dysthymic  Thought process:  normal  Thought content:    WNL  Sensory/Perceptual disturbances:    WNL  Orientation:  oriented to person, place, time/date, and situation  Attention:  Good  Concentration:  Good  Memory:  WNL  Fund of knowledge:   Good  Insight:    Good  Judgment:   Good  Impulse Control:  Good   Reported Symptoms:  depression and anxiety.   Risk Assessment: Danger to Self:  No Self-injurious Behavior: No Danger to Others: No Duty to Warn:no Physical Aggression / Violence:No  Access to Firearms a concern: Yes , has a licensed handgun in the home.  Gang Involvement:No  Patient / guardian was educated about steps to take if suicide or homicide risk level increases between visits: yes.  While future psychiatric events cannot be accurately predicted, the patient does not currently require acute inpatient psychiatric care and does not currently meet [facility] involuntary commitment criteria.  In case of a mental health emergency:  10 - confidential suicide hotline. [facility]iting [facility]        [address], [place] [address]       [phone] 3.   911  4.   Visiting Nearest ED.    Substance Abuse History: Current substance abuse: No     Caffeine: 1x coffee per day.  Tobacco: denied.  Alcohol: denied.  Substance use:  denied.   Past Psychiatric History:   Previous psychological history is significant for anxiety and depression Outpatient  Providers:Caylan Chenard Sim [facility]ast -[facility] (07/2018 - 11/2019) History of Psych Hospitalization: No  Psychological Testing:  NA   Family History: Maternal aunt had a history of depression and committed suicide.   Abuse History:  Victim of: No.,  na    Report needed: No. Victim of Neglect:No. Perpetrator of  na   Witness / Exposure to Domestic Violence: No   Protective Services Involvement: No  Witness to [employer] Violence:  No   Family History:  Family History  Problem Relation Age of Onset   Heart disease Father    Diabetes Mellitus II Sister    Myasthenia gravis Sister    Fibrocystic breast disease Sister    Hypertension Sister    Cancer Sister 55        sinus cancer   Diabetes Mellitus II Brother    Heart disease Mother     Living situation: the patient lives with their spouse  Sexual Orientation: Straight  Relationship Status: married  Name of spouse / other: Jonathon Graham (29).  If a parent, number of children / ages: 102 Kids intotal.   Support Systems: Jonathon Graham and two daughter's that live in [place].   Financial Stress:  Yes , financial stressors due to purchasing a new house.   Income/Employment/Disability: disability.   Military Service: No   Educational History: Education: some college: trade school.   Religion/Sprituality/World View: Christian  Any cultural differences that may affect / interfere with  treatment:  not applicable   Recreation/Hobbies: odd jobs, Training and development officer.   Stressors: Financial difficulties   Health problems    Strengths: Spirituality, Hopefulness, Self Advocate, and Able to Communicate Effectively  Barriers:  mood and health.    Legal History: Pending legal issue / charges:  handling mother's will and complications there of. . History of legal issue / charges:  na  Medical History/Surgical History: reviewed Past Medical History:  Diagnosis Date   Arthritis    Benign prostatic hyperplasia (BPH) with urinary urgency     nodular   COPD (chronic obstructive pulmonary disease) (HCC)    Depression    Diabetes mellitus without complication (HCC)    Essential hypertension    Fibromyalgia, primary    Hyperlipidemia    Hypertension    Obesity     No past surgical history on file.  Medications: Current Outpatient Medications  Medication Sig Dispense Refill   albuterol (PROAIR HFA) 108 (90 Base) MCG/ACT inhaler Inhale 1-2 puffs into the lungs every 4 (four) hours as needed for wheezing or shortness of breath. 18 each 5   albuterol (PROVENTIL) (2.5 MG/3ML) 0.083% nebulizer solution Take 3 mLs (2.5 mg total) by nebulization every 6 (six) hours as needed for wheezing or shortness of breath. 360 mL 6   aspirin 81 MG tablet Take 243 mg by mouth daily. Takes 4 81mg  aspirin daily.     atorvastatin (LIPITOR) 40 MG tablet Take 1 tablet by mouth once daily 90 tablet 0   betamethasone valerate ointment (VALISONE) 0.1 % Apply 1 application topically 2 (two) times daily. For up to 10 days. 30 g 0   blood glucose meter kit and supplies KIT Dispense based on patient and insurance preference. Use up to four times daily as directed. (FOR ICD-9 250.00, 250.01). 1 each 0   Cholecalciferol (VITAMIN D3) 1.25 MG (50000 UT) CAPS TAKE 1 CAPSULE BY MOUTH  ONCE A WEEK 12 capsule 0   clotrimazole (LOTRIMIN) 1 % cream Apply 1 application 2 (two) times daily topically. For 7 days 28 g 0   Continuous Blood Gluc Sensor (FREESTYLE LIBRE 3 SENSOR) MISC 1 each by Does not apply route every 14 (fourteen) days. Place 1 sensor on the skin every 14 days. Use to check glucose continuously 2 each 5   cyclobenzaprine (FLEXERIL) 10 MG tablet Take 1 tablet (10 mg total) by mouth at bedtime as needed for muscle spasms. 15 tablet 0   DULoxetine (CYMBALTA) 60 MG capsule Take 1 capsule (60 mg total) by mouth daily. 90 capsule 1   fluticasone-salmeterol (ADVAIR) 250-50 MCG/ACT AEPB Inhale 1 puff into the lungs every 12 (twelve) hours. 60 each 11   furosemide  (LASIX) 40 MG tablet TAKE 1 TABLET BY MOUTH ONCE DAILY AS NEEDED 90 tablet 1   gabapentin (NEURONTIN) 300 MG capsule TAKE 1 CAPSULE BY MOUTH THREE TIMES DAILY 270 capsule 0   ibuprofen (ADVIL) 800 MG tablet Take 1 tablet (800 mg total) by mouth every 8 (eight) hours as needed. 60 tablet 0   metFORMIN (GLUCOPHAGE) 1000 MG tablet TAKE 1 TABLET BY MOUTH TWICE DAILY WITH MEALS 180 tablet 1   metoprolol tartrate (LOPRESSOR) 25 MG tablet Take 0.5 tablets (12.5 mg total) by mouth 2 (two) times daily.     ondansetron (ZOFRAN) 8 MG tablet Take 1 tablet (8 mg total) by mouth every 8 (eight) hours as needed for nausea or vomiting. 12 tablet 0   oxyCODONE-acetaminophen (PERCOCET/ROXICET) 5-325 MG tablet Take 0.5 tablets by mouth 2 (  two) times daily as needed for severe pain. 30 tablet 0   potassium chloride SA (KLOR-CON) 20 MEQ tablet Take 1 tablet (20 mEq total) by mouth daily. (Patient taking differently: Take 20 mEq by mouth once a week.) 90 tablet 0   Semaglutide, 2 MG/DOSE, 8 MG/3ML SOPN Inject 2 mg as directed once a week. 9 mL 3   tamsulosin (FLOMAX) 0.4 MG CAPS capsule Take 1 capsule by mouth once daily 90 capsule 1   No current facility-administered medications for this visit.    Allergies  Allergen Reactions   Penicillins Anaphylaxis and Swelling    Diagnoses:  Depression, major, single episode, severe (HCC)  Psychiatric Treatment: Yes , by PCP. See chart.   Plan of Care: Outpatient therapy and a psychiatric consult (pending).   Narrative:   Jonathon Graham participated from car, via video, and consented to treatment. Therapist participated from home office. We met online due to COVID pandemic. We switched to audio to to technical difficulties. We reviewed the limits of confidentiality prior to the start of the evaluation and Trypp expressed understanding and agreement to proceed. He was referred by his PCP and is a previous patient of the evaluation.  Evaluator reviewed previous records  prior to the start of the evaluation.  Joseangel is currently prescribed duloxetine 60 mg.  He would benefit from psychiatric consult and one will be arranged once resources are identified.  He was referred due to depression and anxiety.  He noted significant losses in the past 4 years including his mother passing in 2020, his sister passing 6 months later, his sister-in-law passing in 2022, his brother passing in 2023, and his other brother passing in April 2024.  He recently moved [place], to be close to family, but noted that his daughters do not engage with him as often as he would like.  He endorsed feeling lonely.  He lives with his wife Jonathon Graham of 28 years.  Jathaniel is responsible for many of his deceased families states and noted distress in relation to this.  He deals with numerous chronic health issues, which are available for review in his chart. He discussed dealing with chronic pain and noted being candidate for back surgery but discussed that there it is a high percentage chance of complications.  He endorsed numerous symptoms of depression and anxiety. Depressive symptoms include: Loss of interest, feeling down/grief, insomnia and middle insomnia (~3 hours), lethargy, poor appetite (meds?), feeling bad about self. He has a history of SI and a safety plan was reviewed.  Estil expresses understanding and agreement to employ send safety plan should the need arise.  He denied any suicidal ideation but has a history of it in the past.  Please see above for safety plan.  His anxiety symptoms include panic symptoms, worrying about different things, trouble relaxing, restlessness, irritability, feeling afraid something awful might happen.  Jourden would benefit from therapy to process past events, process recent losses, bolster coping skills, proactively manage symptoms, improve frustration tolerance, and engages consistent self-care.  Therapist answered any and all questions prior to the end of the evaluation.   Numerous follow-ups were scheduled to begin treatment starting with creating a treatment plan.  GAD-7: 5 PHQ-9: 6 .  Delight Ovens, LCSW

## 2022-11-25 ENCOUNTER — Other Ambulatory Visit: Payer: Self-pay | Admitting: Family Medicine

## 2022-11-25 DIAGNOSIS — E1142 Type 2 diabetes mellitus with diabetic polyneuropathy: Secondary | ICD-10-CM

## 2022-11-26 DIAGNOSIS — J449 Chronic obstructive pulmonary disease, unspecified: Secondary | ICD-10-CM | POA: Diagnosis not present

## 2022-11-27 NOTE — Telephone Encounter (Signed)
Last office visit 10/04/2022 for Pain Management.  Last refilled 08/29/22 for #[id number] with no refills.  Next Appt: CPE 01/16/2023

## 2022-12-01 ENCOUNTER — Ambulatory Visit (INDEPENDENT_AMBULATORY_CARE_PROVIDER_SITE_OTHER): Payer: 59 | Admitting: Psychology

## 2022-12-01 DIAGNOSIS — F322 Major depressive disorder, single episode, severe without psychotic features: Secondary | ICD-10-CM | POA: Diagnosis not present

## 2022-12-01 NOTE — Progress Notes (Signed)
Jonathon Graham  Patient ID: Jonathon Graham, MRN: [id number]   Date: 12/01/22  Time Spent: 4:02  pm - 4:47 pm : 45 Minutes  Treatment Type: Individual Therapy.  Reported Symptoms: depression and anxiety.  Mental Status Exam: Appearance:  Casual     Behavior: Appropriate  Motor: Normal  Speech/Language:  Clear and Coherent  Affect: Congruent  Mood: dysthymic  Thought process: normal  Thought content:   WNL  Sensory/Perceptual disturbances:   WNL  Orientation: oriented to person, place, time/date, and situation  Attention: Good  Concentration: Good  Memory: WNL  Fund of knowledge:  Good  Insight:   Good  Judgment:  Good  Impulse Control: Good   Risk Assessment: Danger to Self:  No Self-injurious Behavior: No Danger to Others: No Duty to Warn:no Physical Aggression / Violence:No  Access to Firearms a concern: No  Gang Involvement:No   Subjective:   Jonathon Graham participated from car, via video and consented to treatment. Therapist participated from home office. I discussed the limitations of evaluation and management by telemedicine and the availability of in person appointments. The patient expressed understanding and agreed to proceed. Jonathon Graham reviewed the events of the past week. We reviewed numerous treatment approaches including CBT, BA, Problem Solving, and Solution focused therapy. Psych-education regarding the Christin's diagnosis of Depression, major, single episode, severe (HCC) was provided during the session. We discussed Jonathon Graham goals treatment goals which include family and friends, continue to grieve for passed family members, work on managing his health consistently, manage his worry and overall symptoms, manage day-to-day stressors, improving his sleep, and engage in enjoyable activities.  Jonathon Graham provided verbal approval of the treatment plan.   Interventions: Psycho-education & Goal Setting.   Diagnosis:   Depression, major, single episode, severe (HCC)  Psychiatric Treatment: Yes , via PCP. See chart.    Treatment Plan:  Client Abilities/Strengths Jonathon Graham is intelligent, self-ware, and motivated for change.   Support System: Wife and family.   Client Treatment Preferences Outpatient therapy.   Client Statement of Needs Jonathon Graham would like to maintain socializing with family and friends, continue to grieve for passed family members, work on managing his health consistently, manage his worry and overall symptoms, manage day-to-day stressors, improving his sleep, and engage in enjoyable activities.   Treatment Level Weekly  Symptoms  Anxiety: worrying too much about different things, trouble relaxing, restlessness, irritability, and feeling afraid as something awful might happen.  (Status: maintained) Depression: loss of interest, middle insomnia, poor appetite, feeling bad about self.    (Status: maintained)  Goals:   Jonathon Graham experiences symptoms of depression and anxiety.   Treatment plan signed and available on s-drive:  No, pending signature   Target Date: 12/01/23 Frequency: Weekly  Progress: 0 Modality: individual    Therapist will provide referrals for additional resources as appropriate.  Therapist will provide psycho-education regarding Jonathon Graham's diagnosis and corresponding treatment approaches and interventions. Licensed Clinical Social Worker, [place], LCSW will support the patient's ability to achieve the goals identified. will employ CBT, BA, Problem-solving, Solution Focused, Mindfulness,  coping skills, & other evidenced-based practices will be used to promote progress towards healthy functioning to help manage decrease symptoms associated with his diagnosis.   Reduce overall level, frequency, and intensity of the feelings of depression, anxiety and panic evidenced by decreased overall symptoms from 6 to 7 days/week to 0 to 1 days/week per client report for at least 3  consecutive months. Verbally express understanding of the relationship  between feelings of depression, anxiety and their impact on thinking patterns and behaviors. Verbalize an understanding of the role that distorted thinking plays in creating fears, excessive worry, and ruminations.    [name] participated in the creation of the treatment plan)    Delight Ovens, LCSW

## 2022-12-15 ENCOUNTER — Encounter: Payer: 59 | Admitting: Psychology

## 2022-12-15 NOTE — Progress Notes (Signed)
This encounter was created in error - please disregard.

## 2022-12-22 ENCOUNTER — Telehealth: Payer: Self-pay | Admitting: *Deleted

## 2022-12-22 DIAGNOSIS — E1142 Type 2 diabetes mellitus with diabetic polyneuropathy: Secondary | ICD-10-CM

## 2022-12-22 DIAGNOSIS — E1169 Type 2 diabetes mellitus with other specified complication: Secondary | ICD-10-CM

## 2022-12-22 DIAGNOSIS — Z125 Encounter for screening for malignant neoplasm of prostate: Secondary | ICD-10-CM

## 2022-12-22 NOTE — Telephone Encounter (Signed)
-----   Message from Lovena Neighbours sent at 12/22/2022  1:52 PM EDT ----- Regarding: Labs for 8.13.24 Please put physical fasting lab orders in future. Thank you, Denny Peon

## 2022-12-26 DIAGNOSIS — J449 Chronic obstructive pulmonary disease, unspecified: Secondary | ICD-10-CM | POA: Diagnosis not present

## 2022-12-26 DIAGNOSIS — G4733 Obstructive sleep apnea (adult) (pediatric): Secondary | ICD-10-CM | POA: Diagnosis not present

## 2022-12-27 ENCOUNTER — Other Ambulatory Visit: Payer: Self-pay | Admitting: Cardiovascular Disease

## 2022-12-27 ENCOUNTER — Encounter (INDEPENDENT_AMBULATORY_CARE_PROVIDER_SITE_OTHER): Payer: Self-pay

## 2022-12-27 DIAGNOSIS — E1169 Type 2 diabetes mellitus with other specified complication: Secondary | ICD-10-CM

## 2022-12-29 ENCOUNTER — Other Ambulatory Visit: Payer: Self-pay | Admitting: Cardiovascular Disease

## 2022-12-29 ENCOUNTER — Ambulatory Visit: Payer: 59 | Admitting: Psychology

## 2023-01-06 ENCOUNTER — Other Ambulatory Visit: Payer: Self-pay | Admitting: Family Medicine

## 2023-01-06 DIAGNOSIS — I10 Essential (primary) hypertension: Secondary | ICD-10-CM

## 2023-01-09 ENCOUNTER — Ambulatory Visit (INDEPENDENT_AMBULATORY_CARE_PROVIDER_SITE_OTHER): Payer: 59 | Admitting: Psychology

## 2023-01-09 ENCOUNTER — Other Ambulatory Visit (INDEPENDENT_AMBULATORY_CARE_PROVIDER_SITE_OTHER): Payer: 59

## 2023-01-09 DIAGNOSIS — Z125 Encounter for screening for malignant neoplasm of prostate: Secondary | ICD-10-CM

## 2023-01-09 DIAGNOSIS — E1142 Type 2 diabetes mellitus with diabetic polyneuropathy: Secondary | ICD-10-CM

## 2023-01-09 DIAGNOSIS — E785 Hyperlipidemia, unspecified: Secondary | ICD-10-CM | POA: Diagnosis not present

## 2023-01-09 DIAGNOSIS — F322 Major depressive disorder, single episode, severe without psychotic features: Secondary | ICD-10-CM | POA: Diagnosis not present

## 2023-01-09 DIAGNOSIS — E1169 Type 2 diabetes mellitus with other specified complication: Secondary | ICD-10-CM | POA: Diagnosis not present

## 2023-01-09 LAB — COMPREHENSIVE METABOLIC PANEL
ALT: 16 U/L (ref 0–53)
AST: 14 U/L (ref 0–37)
Albumin: 4.2 g/dL (ref 3.5–5.2)
Alkaline Phosphatase: 61 U/L (ref 39–117)
BUN: 18 mg/dL (ref 6–23)
CO2: 33 mEq/L — ABNORMAL HIGH (ref 19–32)
Calcium: 9.2 mg/dL (ref 8.4–10.5)
Chloride: 99 mEq/L (ref 96–112)
Creatinine, Ser: 0.95 mg/dL (ref 0.40–1.50)
GFR: 84.5 mL/min (ref >60.00)
Glucose, Bld: 125 mg/dL — ABNORMAL HIGH (ref 70–99)
Potassium: 4 mEq/L (ref 3.5–5.1)
Sodium: 141 mEq/L (ref 135–145)
Total Bilirubin: 1.2 mg/dL (ref 0.2–1.2)
Total Protein: 6.8 g/dL (ref 6.0–8.3)

## 2023-01-09 LAB — LIPID PANEL
Cholesterol: 96 mg/dL (ref 0–200)
HDL: 38.4 mg/dL — ABNORMAL LOW (ref >39.00)
LDL Cholesterol: 45 mg/dL (ref 0–99)
NonHDL: 57.1
Total CHOL/HDL Ratio: 2
Triglycerides: 59 mg/dL (ref 0.0–149.0)
VLDL: 11.8 mg/dL (ref 0.0–40.0)

## 2023-01-09 LAB — MICROALBUMIN / CREATININE URINE RATIO
Creatinine,U: 369.2 mg/dL
Microalb Creat Ratio: 1.5 mg/g (ref 0.0–30.0)
Microalb, Ur: 5.6 mg/dL — ABNORMAL HIGH (ref 0.0–1.9)

## 2023-01-09 LAB — HEMOGLOBIN A1C: Hgb A1c MFr Bld: 6.7 % — ABNORMAL HIGH (ref 4.6–6.5)

## 2023-01-09 LAB — PSA: PSA: 0.69 ng/mL (ref 0.10–4.00)

## 2023-01-09 NOTE — Progress Notes (Signed)
[facility] Counselor/Therapist Progress Note  Patient ID: Jonathon Graham, MRN: [id number]   Date: 01/09/23  Time Spent: 3:09  pm - 3:55 pm : 46 Minutes  Treatment Type: Individual Therapy.  Reported Symptoms: depression and anxiety.  Mental Status Exam: Appearance:  Casual     Behavior: Appropriate  Motor: Normal  Speech/Language:  Clear and Coherent  Affect: Congruent  Mood: dysthymic  Thought process: normal  Thought content:   WNL  Sensory/Perceptual disturbances:   WNL  Orientation: oriented to person, place, time/date, and situation  Attention: Good  Concentration: Good  Memory: WNL  Fund of knowledge:  Good  Insight:   Good  Judgment:  Good  Impulse Control: Good   Risk Assessment: Danger to Self:  No Self-injurious Behavior: No Danger to Others: No Duty to Warn:no Physical Aggression / Violence:No  Access to Firearms a concern: No  Gang Involvement:No   Subjective:   Jonathon Graham participated from car, via video, is aware of limitations of tele-session,  and consented to treatment. Therapist participated from office. Jonathon Graham noted having a long day as he addresses his medical, dental, and psychological health. He is looking forward to his visit to [place], where is is originally from, and his hopes to reconnect with family and friends. He noted to continue to grieve for his family members that he's lost. He noted his mood being affected by his grief and pain. He noted feeling pressure, from family, due to being looked at as the patriarch of the family and others looking to him for assistance and advice. He noted continued grief and noted being triggered by tv shows, for example. We worked on identifying boundaries for his daughter and ways to communicate this. Jonathon Graham was engaged and motivated during the session and expressed commitment towards goals. Therapist praised Jonathon Graham and provided supportive therapy. A follow-up was scheduled for continued treatment.    Interventions: CBT & interpersonal.  Diagnosis:  Depression, major, single episode, severe (HCC)  Psychiatric Treatment: Yes , via PCP. See chart.    Treatment Plan:  Client Abilities/Strengths Jonathon Graham is intelligent, self-ware, and motivated for change.   Support System: Wife and family.   Client Treatment Preferences Outpatient therapy.   Client Statement of Needs Jonathon Graham would like to maintain socializing with family and friends, continue to grieve for passed family members, work on managing his health consistently, manage his worry and overall symptoms, manage day-to-day stressors, improving his sleep, and engage in enjoyable activities.   Treatment Level Weekly  Symptoms  Anxiety: worrying too much about different things, trouble relaxing, restlessness, irritability, and feeling afraid as something awful might happen.  (Status: maintained) Depression: loss of interest, middle insomnia, poor appetite, feeling bad about self.    (Status: maintained)  Goals:   Jonathon Graham experiences symptoms of depression and anxiety.   Treatment plan signed and available on s-drive:  No, pending signature   Target Date: 12/01/23 Frequency: Weekly  Progress: 0 Modality: individual    Therapist will provide referrals for additional resources as appropriate.  Therapist will provide psycho-education regarding Jonathon Graham's diagnosis and corresponding treatment approaches and interventions. Licensed Clinical Social Worker, [place], LCSW will support the patient's ability to achieve the goals identified. will employ CBT, BA, Problem-solving, Solution Focused, Mindfulness,  coping skills, & other evidenced-based practices will be used to promote progress towards healthy functioning to help manage decrease symptoms associated with his diagnosis.   Reduce overall level, frequency, and intensity of the feelings of depression, anxiety and panic evidenced by  decreased overall symptoms from 6 to 7  days/week to 0 to 1 days/week per client report for at least 3 consecutive months. Verbally express understanding of the relationship between feelings of depression, anxiety and their impact on thinking patterns and behaviors. Verbalize an understanding of the role that distorted thinking plays in creating fears, excessive worry, and ruminations.    Jonathon Graham participated in the creation of the treatment plan)    Delight Ovens, LCSW

## 2023-01-09 NOTE — Progress Notes (Signed)
No critical labs need to be addressed urgently. We will discuss labs in detail at upcoming office visit.   

## 2023-01-16 ENCOUNTER — Encounter: Payer: 59 | Admitting: Family Medicine

## 2023-01-26 ENCOUNTER — Encounter: Payer: 59 | Admitting: Psychology

## 2023-01-26 DIAGNOSIS — J449 Chronic obstructive pulmonary disease, unspecified: Secondary | ICD-10-CM | POA: Diagnosis not present

## 2023-01-26 NOTE — Progress Notes (Deleted)
° ° ° ° ° ° ° ° ° ° ° ° ° ° °  Osman Mostafa, LCSW °

## 2023-01-26 NOTE — Progress Notes (Signed)
This encounter was created in error - please disregard.

## 2023-01-28 DIAGNOSIS — G4733 Obstructive sleep apnea (adult) (pediatric): Secondary | ICD-10-CM | POA: Diagnosis not present

## 2023-01-31 ENCOUNTER — Encounter: Payer: 59 | Admitting: Family Medicine

## 2023-02-15 ENCOUNTER — Other Ambulatory Visit: Payer: Self-pay | Admitting: Family Medicine

## 2023-02-15 DIAGNOSIS — M25552 Pain in left hip: Secondary | ICD-10-CM

## 2023-02-15 DIAGNOSIS — M48061 Spinal stenosis, lumbar region without neurogenic claudication: Secondary | ICD-10-CM

## 2023-02-15 NOTE — Telephone Encounter (Signed)
Last office visit 10/04/2022 for Pain Management.  Last refilled 10/04/22 for #30 with no refills.  Next Appt: CPE 03/02/2023.  UDS/Contract: 07/04/2022.

## 2023-02-15 NOTE — Telephone Encounter (Signed)
Prescription Request  02/15/2023  LOV: 10/04/2022  What is the name of the medication or equipment? oxyCODONE-acetaminophen (PERCOCET/ROXICET) 5-325 MG tablet  Have you contacted your pharmacy to request a refill? Yes   Which pharmacy would you like this sent to?  [facility] [address] [place] - [address] [place] [address] Phone: [phone] Fax: [phone]    Patient notified that their request is being sent to the clinical staff for review and that they should receive a response within 2 business days.   Please advise at Mobile [phone] (mobile)

## 2023-02-23 ENCOUNTER — Other Ambulatory Visit: Payer: Self-pay | Admitting: Family Medicine

## 2023-02-23 DIAGNOSIS — E1142 Type 2 diabetes mellitus with diabetic polyneuropathy: Secondary | ICD-10-CM

## 2023-02-26 DIAGNOSIS — J449 Chronic obstructive pulmonary disease, unspecified: Secondary | ICD-10-CM | POA: Diagnosis not present

## 2023-02-26 NOTE — Telephone Encounter (Signed)
Patient called in to check on the status of this medication being sent into the pharmacy for him.

## 2023-02-26 NOTE — Telephone Encounter (Signed)
Patient was scheduled for CPE on 03/02/23, had to be rescheduled for 03/29/23.

## 2023-02-27 MED ORDER — OXYCODONE-ACETAMINOPHEN 5-325 MG PO TABS
0.5000 | ORAL_TABLET | Freq: Two times a day (BID) | ORAL | 0 refills | Status: DC | PRN
Start: 2023-02-27 — End: 2023-06-18

## 2023-02-27 NOTE — Telephone Encounter (Signed)
Let pt know refill sent given appt change.

## 2023-02-27 NOTE — Addendum Note (Signed)
Addended by: Kerby Nora E on: 02/27/2023 11:16 AM   Modules accepted: Orders

## 2023-03-02 ENCOUNTER — Encounter: Payer: 59 | Admitting: Family Medicine

## 2023-03-14 ENCOUNTER — Other Ambulatory Visit (HOSPITAL_COMMUNITY): Payer: Self-pay

## 2023-03-17 ENCOUNTER — Other Ambulatory Visit: Payer: Self-pay | Admitting: Family Medicine

## 2023-03-17 DIAGNOSIS — E119 Type 2 diabetes mellitus without complications: Secondary | ICD-10-CM

## 2023-03-19 ENCOUNTER — Other Ambulatory Visit: Payer: Self-pay | Admitting: Family Medicine

## 2023-03-21 ENCOUNTER — Telehealth: Payer: Self-pay | Admitting: Family Medicine

## 2023-03-21 NOTE — Telephone Encounter (Signed)
Pt called stating he's having a lot of pain in his right hip & left shoulder. Pt states he can barely move. Pt requested for a steroid be called in from Dr. Ermalene Searing? Call back # [phone]

## 2023-03-21 NOTE — Telephone Encounter (Signed)
Patient will need to be evaluated.  Please call back to schedule an appointment.

## 2023-03-22 ENCOUNTER — Ambulatory Visit: Payer: 59 | Admitting: Family Medicine

## 2023-03-22 ENCOUNTER — Ambulatory Visit (INDEPENDENT_AMBULATORY_CARE_PROVIDER_SITE_OTHER)
Admission: RE | Admit: 2023-03-22 | Discharge: 2023-03-22 | Disposition: A | Discharge status: Home / Self Care | Payer: 59 | Source: Ambulatory Visit | Attending: Family Medicine | Admitting: Family Medicine

## 2023-03-22 ENCOUNTER — Other Ambulatory Visit: Payer: Self-pay | Admitting: Family Medicine

## 2023-03-22 ENCOUNTER — Encounter: Payer: Self-pay | Admitting: Family Medicine

## 2023-03-22 VITALS — BP 120/70 | HR 64 | Temp 97.4°F | Ht 67.0 in

## 2023-03-22 DIAGNOSIS — E1169 Type 2 diabetes mellitus with other specified complication: Secondary | ICD-10-CM | POA: Diagnosis not present

## 2023-03-22 DIAGNOSIS — M1611 Unilateral primary osteoarthritis, right hip: Secondary | ICD-10-CM | POA: Diagnosis not present

## 2023-03-22 DIAGNOSIS — M25512 Pain in left shoulder: Secondary | ICD-10-CM | POA: Diagnosis not present

## 2023-03-22 DIAGNOSIS — E785 Hyperlipidemia, unspecified: Secondary | ICD-10-CM | POA: Diagnosis not present

## 2023-03-22 DIAGNOSIS — I152 Hypertension secondary to endocrine disorders: Secondary | ICD-10-CM | POA: Diagnosis not present

## 2023-03-22 DIAGNOSIS — E1159 Type 2 diabetes mellitus with other circulatory complications: Secondary | ICD-10-CM | POA: Diagnosis not present

## 2023-03-22 DIAGNOSIS — E1142 Type 2 diabetes mellitus with diabetic polyneuropathy: Secondary | ICD-10-CM

## 2023-03-22 DIAGNOSIS — M25551 Pain in right hip: Secondary | ICD-10-CM | POA: Diagnosis not present

## 2023-03-22 DIAGNOSIS — I878 Other specified disorders of veins: Secondary | ICD-10-CM | POA: Diagnosis not present

## 2023-03-22 DIAGNOSIS — G894 Chronic pain syndrome: Secondary | ICD-10-CM | POA: Diagnosis not present

## 2023-03-22 DIAGNOSIS — Z7985 Long-term (current) use of injectable non-insulin antidiabetic drugs: Secondary | ICD-10-CM

## 2023-03-22 DIAGNOSIS — S43002A Unspecified subluxation of left shoulder joint, initial encounter: Secondary | ICD-10-CM | POA: Diagnosis not present

## 2023-03-22 DIAGNOSIS — Z7984 Long term (current) use of oral hypoglycemic drugs: Secondary | ICD-10-CM | POA: Diagnosis not present

## 2023-03-22 MED ORDER — PREDNISONE 20 MG PO TABS: ORAL_TABLET | ORAL | 0 refills | Status: DC

## 2023-03-22 NOTE — Assessment & Plan Note (Addendum)
Acute, left shoulder pain started first.  No preceding fall or change in activity.  Symptoms and exam consistent with likely left shoulder bursitis versus osteoarthritis of shoulder joint.  No clear neck pathology noted or cervical radiculopathy symptoms.  Will evaluate with x-ray. Treat with prednisone taper.  Start home physical therapy.  Consider referral to orthopedics for steroid injection if not improving as expected.

## 2023-03-22 NOTE — Patient Instructions (Addendum)
Complete prednisone taper.  We will will call with X-ray results,  Start home physical therapy.

## 2023-03-22 NOTE — Assessment & Plan Note (Signed)
Stable, chronic.  Continue current medication.   at goal < 70 on atorvastatin 40 mg p.o. daily

## 2023-03-22 NOTE — Assessment & Plan Note (Addendum)
Acute, no preceding fall or change in activity.  Symptoms started after laying in bed in a different way given the left shoulder pain.  Symptoms most consistent with right trochanteric bursitis less likely osteoarthritis of hip. No clear radiation from low back, symptoms and exam not consistent with sciatica.  Moderate movement of right hip joint. Will evaluate with plain film of right hip.  Return and ER precautions provided.

## 2023-03-22 NOTE — Assessment & Plan Note (Addendum)
Stable  chronic.  4  Now HR improved, BP remains well controlled off metoprolol.

## 2023-03-22 NOTE — Progress Notes (Signed)
Patient ID: Jonathon Graham, male    DOB: June 27, 1955, 67 y.o.   MRN: [id number]  This visit was conducted in person.  BP 120/70 (BP Location: Right Arm, Patient Position: Sitting, Cuff Size: Large)   Pulse 64   Temp (!) 97.4 F (36.3 C) (Temporal)   Ht 5\' 7"  (1.702 m)   SpO2 92%   BMI 44.79 kg/m    CC:  Chief Complaint  Patient presents with   Shoulder Pain    Left   Hip Pain    Right     Subjective:   HPI: Jonathon Graham is a 67 y.o. male presenting on 03/22/2023 for Shoulder Pain (Left) and Hip Pain (Right )   Right hip and left shoulder pain  Ongoing now in last week.  Pain in lateral hip, cannot lay on that side. Pain radiates to right upper leg.  No numbness, weakness because of pain.   PAin is in left posterior shoulder    No current low back pain or neck pain.  No proceeding  fall or change in activity.  Cannot put weight on  right leg. Decreased ROM in left shoulder.  No fever, no flu like symptoms.   Using OTC ointment, heating pad.  Using ibuprofen 800 mg  . Has not helped.   Wife is having to help him ambulate, dress, bathe etc.    Pain started in hip after he slept differently to ease pain in left shoulder.   Diabetes:   Metformin 1000 mg daily  Semaglutide 2 mg weekly Lab Results  Component Value Date   HGBA1C 6.7 (H) 01/09/2023  Using medications without difficulties: Hypoglycemic episodes: Hyperglycemic episodes: Feet problems: Blood Sugars averaging: eye exam within last year:  No able to weight today. Wt Readings from Last 3 Encounters:  10/04/22 286 lb (129.7 kg)  09/25/22 288 lb (130.6 kg)  07/04/22 288 lb 6 oz (130.8 kg)   Hypertension:   Off medication. Off BBlocker HR baterr in office today. BP Readings from Last 3 Encounters:  03/22/23 120/70  10/04/22 122/82  07/04/22 120/80  Using medication without problems or lightheadedness:  Chest pain with exertion: Edema: Short of breath: Average home BPs: Other  issues:  Elevated Cholesterol:  at goal < 70 on atorvastatin 40 mg p.o. daily Lab Results  Component Value Date   CHOL 96 01/09/2023   HDL 38.40 (L) 01/09/2023   LDLCALC 45 01/09/2023   TRIG 59.0 01/09/2023   CHOLHDL 2 01/09/2023  Using medications without problems: Muscle aches:  Diet compliance: Exercise: Other complaints:    Indication for chronic opioid:  chronic low back pain.. not currently an issue today, but having shoiulder and hip apin Medication and dose:  oxycodone 5/325 mg  0.5 to 1 tablet daily. # pills per  THREE month: 30 Last UDS date: 07/04/22 Opioid Treatment Agreement signed (Y/N):  Y 07/04/22 Opioid Treatment Agreement last reviewed with patient:   yes NCCSRS reviewed this encounter (include red flags): Yes  no red flags.   PDMP reviewed during this encounter.  Relevant past medical, surgical, family and social history reviewed and updated as indicated. Interim medical history since our last visit reviewed. Allergies and medications reviewed and updated. Outpatient Medications Prior to Visit  Medication Sig Dispense Refill   albuterol (PROAIR HFA) 108 (90 Base) MCG/ACT inhaler Inhale 1-2 puffs into the lungs every 4 (four) hours as needed for wheezing or shortness of breath. 18 each 5   albuterol (PROVENTIL) (2.5 MG/3ML) 0.083% nebulizer  solution Take 3 mLs (2.5 mg total) by nebulization every 6 (six) hours as needed for wheezing or shortness of breath. 360 mL 6   aspirin 81 MG tablet Take 243 mg by mouth daily. Takes 4 81mg  aspirin daily.     atorvastatin (LIPITOR) 40 MG tablet Take 1 tablet by mouth once daily 90 tablet 1   betamethasone valerate ointment (VALISONE) 0.1 % Apply 1 application topically 2 (two) times daily. For up to 10 days. 30 g 0   blood glucose meter kit and supplies KIT Dispense based on patient and insurance preference. Use up to four times daily as directed. (FOR ICD-9 250.00, 250.01). 1 each 0   Cholecalciferol (VITAMIN D3) 1.25 MG (50000  UT) CAPS TAKE 1 CAPSULE BY MOUTH  ONCE A WEEK 12 capsule 0   clotrimazole (LOTRIMIN) 1 % cream Apply 1 application 2 (two) times daily topically. For 7 days 28 g 0   Continuous Blood Gluc Sensor (FREESTYLE LIBRE 3 SENSOR) MISC 1 each by Does not apply route every 14 (fourteen) days. Place 1 sensor on the skin every 14 days. Use to check glucose continuously 2 each 5   cyclobenzaprine (FLEXERIL) 10 MG tablet Take 1 tablet (10 mg total) by mouth at bedtime as needed for muscle spasms. 15 tablet 0   DULoxetine (CYMBALTA) 60 MG capsule Take 1 capsule (60 mg total) by mouth daily. 90 capsule 1   fluticasone-salmeterol (ADVAIR) 250-50 MCG/ACT AEPB INHALE 1 DOSE BY MOUTH EVERY 12 HOURS 60 each 0   furosemide (LASIX) 40 MG tablet TAKE 1 TABLET BY MOUTH ONCE DAILY AS NEEDED 90 tablet 0   gabapentin (NEURONTIN) 300 MG capsule TAKE 1 CAPSULE BY MOUTH THREE TIMES DAILY 270 capsule 0   ibuprofen (ADVIL) 800 MG tablet Take 1 tablet (800 mg total) by mouth every 8 (eight) hours as needed. 60 tablet 0   metFORMIN (GLUCOPHAGE) 1000 MG tablet TAKE 1 TABLET BY MOUTH TWICE DAILY WITH MEALS 180 tablet 0   metoprolol tartrate (LOPRESSOR) 25 MG tablet Take 1 tablet by mouth twice daily 180 tablet 2   ondansetron (ZOFRAN) 8 MG tablet Take 1 tablet (8 mg total) by mouth every 8 (eight) hours as needed for nausea or vomiting. 12 tablet 0   oxyCODONE-acetaminophen (PERCOCET/ROXICET) 5-325 MG tablet Take 0.5 tablets by mouth 2 (two) times daily as needed for severe pain. 30 tablet 0   potassium chloride SA (KLOR-CON) 20 MEQ tablet Take 1 tablet (20 mEq total) by mouth daily. (Patient taking differently: Take 20 mEq by mouth once a week.) 90 tablet 0   Semaglutide, 2 MG/DOSE, 8 MG/3ML SOPN Inject 2 mg as directed once a week. 9 mL 3   tamsulosin (FLOMAX) 0.4 MG CAPS capsule Take 1 capsule by mouth once daily 90 capsule 1   No facility-administered medications prior to visit.     Per HPI unless specifically indicated in ROS  section below Review of Systems Objective:  BP 120/70 (BP Location: Right Arm, Patient Position: Sitting, Cuff Size: Large)   Pulse 64   Temp (!) 97.4 F (36.3 C) (Temporal)   Ht 5\' 7"  (1.702 m)   SpO2 92%   BMI 44.79 kg/m   Wt Readings from Last 3 Encounters:  10/04/22 286 lb (129.7 kg)  09/25/22 288 lb (130.6 kg)  07/04/22 288 lb 6 oz (130.8 kg)      Physical Exam    Results for orders placed or performed in visit on 01/09/23  PSA  Result Value  Ref Range   PSA 0.69 0.10 - 4.00 ng/mL  Microalbumin / creatinine urine ratio  Result Value Ref Range   Microalb, Ur 5.6 (H) 0.0 - 1.9 mg/dL   Creatinine,U 782.9 mg/dL   Microalb Creat Ratio 1.5 0.0 - 30.0 mg/g  Lipid panel  Result Value Ref Range   Cholesterol 96 0 - 200 mg/dL   Triglycerides 56.2 0.0 - 149.0 mg/dL   HDL 13.08 (L) >65.78 mg/dL   VLDL 46.9 0.0 - 62.9 mg/dL   LDL Cholesterol 45 0 - 99 mg/dL   Total CHOL/HDL Ratio 2    NonHDL 57.10   Hemoglobin A1c  Result Value Ref Range   Hgb A1c MFr Bld 6.7 (H) 4.6 - 6.5 %  Comprehensive metabolic panel  Result Value Ref Range   Sodium 141 135 - 145 mEq/L   Potassium 4.0 3.5 - 5.1 mEq/L   Chloride 99 96 - 112 mEq/L   CO2 33 (H) 19 - 32 mEq/L   Glucose, Bld 125 (H) 70 - 99 mg/dL   BUN 18 6 - 23 mg/dL   Creatinine, Ser 5.28 0.40 - 1.50 mg/dL   Total Bilirubin 1.2 0.2 - 1.2 mg/dL   Alkaline Phosphatase 61 39 - 117 U/L   AST 14 0 - 37 U/L   ALT 16 0 - 53 U/L   Total Protein 6.8 6.0 - 8.3 g/dL   Albumin 4.2 3.5 - 5.2 g/dL   GFR 41.32 >44.01 mL/min   Calcium 9.2 8.4 - 10.5 mg/dL    Assessment and Plan  Type 2 diabetes, controlled, with peripheral neuropathy (HCC)  Hypertension associated with diabetes (HCC) Assessment & Plan: Stable  chronic.  4  Now HR improved, BP remains well controlled off metoprolol.    Hyperlipidemia associated with type 2 diabetes mellitus (HCC) Assessment & Plan: Stable, chronic.  Continue current medication.   at goal < 70 on  atorvastatin 40 mg p.o. daily   Chronic pain syndrome  Acute pain of left shoulder Assessment & Plan: Acute, left shoulder pain started first.  No preceding fall or change in activity.  Symptoms and exam consistent with likely left shoulder bursitis versus osteoarthritis of shoulder joint.  No clear neck pathology noted or cervical radiculopathy symptoms.  Will evaluate with x-ray. Treat with prednisone taper.  Start home physical therapy.  Consider referral to orthopedics for steroid injection if not improving as expected.  Orders: -     DG Shoulder Left; Future  Right hip pain Assessment & Plan: Acute, no preceding fall or change in activity.  Symptoms started after laying in bed in a different way given the left shoulder pain.  Symptoms most consistent with right trochanteric bursitis less likely osteoarthritis of hip. No clear radiation from low back, symptoms and exam not consistent with sciatica.  Moderate movement of right hip joint. Will evaluate with plain film of right hip.  Return and ER precautions provided.  Orders: -     DG HIP UNILAT W OR W/O PELVIS 2-3 VIEWS RIGHT; Future  Other orders -     predniSONE; 3 tabs by mouth daily x 3 days, then 2 tabs by mouth daily x 2 days then 1 tab by mouth daily x 2 days  Dispense: 15 tablet; Refill: 0    Return if symptoms worsen or fail to improve.   Kerby Nora, MD

## 2023-03-26 DIAGNOSIS — G4733 Obstructive sleep apnea (adult) (pediatric): Secondary | ICD-10-CM | POA: Diagnosis not present

## 2023-03-28 ENCOUNTER — Other Ambulatory Visit: Payer: Self-pay | Admitting: Family Medicine

## 2023-03-28 DIAGNOSIS — I10 Essential (primary) hypertension: Secondary | ICD-10-CM

## 2023-03-28 DIAGNOSIS — G4733 Obstructive sleep apnea (adult) (pediatric): Secondary | ICD-10-CM | POA: Diagnosis not present

## 2023-03-28 DIAGNOSIS — J449 Chronic obstructive pulmonary disease, unspecified: Secondary | ICD-10-CM | POA: Diagnosis not present

## 2023-03-29 ENCOUNTER — Encounter: Payer: Self-pay | Admitting: Family Medicine

## 2023-03-29 ENCOUNTER — Ambulatory Visit: Payer: 59 | Admitting: Family Medicine

## 2023-03-29 VITALS — BP 136/82 | HR 59 | Temp 98.8°F | Ht 68.5 in | Wt 287.5 lb

## 2023-03-29 DIAGNOSIS — M25511 Pain in right shoulder: Secondary | ICD-10-CM | POA: Diagnosis not present

## 2023-03-29 DIAGNOSIS — Z6841 Body Mass Index (BMI) 40.0 and over, adult: Secondary | ICD-10-CM | POA: Diagnosis not present

## 2023-03-29 DIAGNOSIS — E1142 Type 2 diabetes mellitus with diabetic polyneuropathy: Secondary | ICD-10-CM | POA: Diagnosis not present

## 2023-03-29 DIAGNOSIS — E1159 Type 2 diabetes mellitus with other circulatory complications: Secondary | ICD-10-CM

## 2023-03-29 DIAGNOSIS — E1169 Type 2 diabetes mellitus with other specified complication: Secondary | ICD-10-CM | POA: Diagnosis not present

## 2023-03-29 DIAGNOSIS — Z0000 Encounter for general adult medical examination without abnormal findings: Secondary | ICD-10-CM

## 2023-03-29 DIAGNOSIS — G8929 Other chronic pain: Secondary | ICD-10-CM

## 2023-03-29 DIAGNOSIS — E785 Hyperlipidemia, unspecified: Secondary | ICD-10-CM

## 2023-03-29 DIAGNOSIS — Z7985 Long-term (current) use of injectable non-insulin antidiabetic drugs: Secondary | ICD-10-CM

## 2023-03-29 DIAGNOSIS — F322 Major depressive disorder, single episode, severe without psychotic features: Secondary | ICD-10-CM

## 2023-03-29 DIAGNOSIS — G4733 Obstructive sleep apnea (adult) (pediatric): Secondary | ICD-10-CM

## 2023-03-29 DIAGNOSIS — E662 Morbid (severe) obesity with alveolar hypoventilation: Secondary | ICD-10-CM

## 2023-03-29 DIAGNOSIS — I152 Hypertension secondary to endocrine disorders: Secondary | ICD-10-CM

## 2023-03-29 DIAGNOSIS — E1149 Type 2 diabetes mellitus with other diabetic neurological complication: Secondary | ICD-10-CM

## 2023-03-29 DIAGNOSIS — J439 Emphysema, unspecified: Secondary | ICD-10-CM

## 2023-03-29 DIAGNOSIS — I5020 Unspecified systolic (congestive) heart failure: Secondary | ICD-10-CM

## 2023-03-29 DIAGNOSIS — Z87891 Personal history of nicotine dependence: Secondary | ICD-10-CM

## 2023-03-29 DIAGNOSIS — Z7984 Long term (current) use of oral hypoglycemic drugs: Secondary | ICD-10-CM

## 2023-03-29 MED ORDER — PREDNISONE 20 MG PO TABS: ORAL_TABLET | ORAL | 0 refills | Status: DC

## 2023-03-29 NOTE — Assessment & Plan Note (Signed)
Chronic, moderate control  Seeing Shela Leff psychologist.

## 2023-03-29 NOTE — Assessment & Plan Note (Signed)
Stable, tolerable control on gabapentin

## 2023-03-29 NOTE — Assessment & Plan Note (Signed)
On CPAP. ?

## 2023-03-29 NOTE — Assessment & Plan Note (Signed)
He has continued to have weight loss on Ozempic at 2 mg weekly.

## 2023-03-29 NOTE — Assessment & Plan Note (Signed)
Chronic, stable  Using albuterol as needed and Advair 250/151 puff twice daily as controller

## 2023-03-29 NOTE — Assessment & Plan Note (Signed)
Stable, chronic.  Continue current medication.   at goal < 70 on atorvastatin 40 mg p.o. daily

## 2023-03-29 NOTE — Assessment & Plan Note (Signed)
Chronic, well-controlled with weight loss, low-carb diet, regular exercise.  Semaglutide to 2 mg injected weekly Metformin 1000 mg p.o. daily.

## 2023-03-29 NOTE — Assessment & Plan Note (Addendum)
Acute, significant improvement with prednisone.  Orthopedic referral in process.  Will extend prednisone taper.

## 2023-03-29 NOTE — Assessment & Plan Note (Signed)
Working on weight loss with Ozempic.  On oxygen with ambulation and sleep.

## 2023-03-29 NOTE — Assessment & Plan Note (Signed)
Chronic, followed by Dr. Gery Pray cardiology Last ECHO 06/2022  Euvolemic in office today using 40 mg p.o. daily as needed

## 2023-03-29 NOTE — Assessment & Plan Note (Signed)
Stable  chronic.    Now HR improved, BP remains well controlled off metoprolol.

## 2023-03-29 NOTE — Progress Notes (Signed)
Patient ID: Jonathon Graham, male    DOB: 07-Jan-1956, 67 y.o.   MRN: [id number]  This visit was conducted in person.  BP 136/82   Pulse (!) 59   Temp 98.8 F (37.1 C) (Oral)   Ht 5' 8.5" (1.74 m)   Wt 287 lb 8 oz (130.4 kg)   SpO2 95%   BMI 43.08 kg/m    CC:  Chief Complaint  Patient presents with   Annual Exam    MCR prt 2 [AWV- 09/25/22]. C/o ongoing L arm pain. Finished prednisone, helpful.     Subjective:   HPI: Jonathon Graham is a 67 y.o. male presenting on 03/29/2023 for Annual Exam [facility] prt 2 [AWV- 09/25/22]. C/o ongoing L arm pain. Finished prednisone, helpful. )  The patient presents for  complete physical and review of chronic health problems. He/She also has the following acute concerns today: none  The patient saw a LPN or RN for medicare wellness visit. 4/29/224  Prevention and wellness was reviewed in detail. Note reviewed and important notes copied below.  Reviewed recent office visit on October 24 for left shoulder pain, and right hip pain.  He had significant improvement with prednisone taper but it has not resolved completely.  He has a referral in process to orthopedics  Pain down from 10 to 45.  He feel; longer course of prednisone would be helpful  Diabetes: Well-controlled on metformin 1000 mg p.o. daily and Ozempic 2 mg weekly Lab Results  Component Value Date   HGBA1C 6.7 (H) 01/09/2023  Using medications without difficulties: Hypoglycemic episodes:  none Hyperglycemic episodes:none Feet problems: Associated with neuropathy Blood Sugars averaging: FBS 84-100 eye exam within last year:  upcoming 04/02/2023  Wt Readings from Last 3 Encounters:  03/29/23 287 lb 8 oz (130.4 kg)  10/04/22 286 lb (129.7 kg)  09/25/22 288 lb (130.6 kg)  Body mass index is 43.08 kg/m.   Elevated Cholesterol: Cholesterol at goal < 70 on atorvastatin 40 mg p.o. daily. Lab Results  Component Value Date   CHOL 96 01/09/2023   HDL 38.40 (L) 01/09/2023   LDLCALC 45  01/09/2023   TRIG 59.0 01/09/2023   CHOLHDL 2 01/09/2023  Using medications without problems: none Muscle aches: none Diet compliance: good Exercise: more active over the summer Other complaints:   HFrEF followed by cardiology.  Hypertension:  Well controlled , off metoprolol 25 mg p.o. BID given bradycardia. BP Readings from Last 3 Encounters:  03/29/23 136/82  03/22/23 120/70  10/04/22 122/82  Using medication without problems or lightheadedness:  none Chest pain with exertion: none Edema: none Short of breath: none Average home BPs: Other issues:   MDD:  moderately controlled on cymbalta 60 mg daily     COPD: Chronic, stable  Using albuterol as needed and Advair 250/151 puff twice daily as controller   Relevant past medical, surgical, family and social history reviewed and updated as indicated. Interim medical history since our last visit reviewed. Allergies and medications reviewed and updated. Outpatient Medications Prior to Visit  Medication Sig Dispense Refill   albuterol (PROAIR HFA) 108 (90 Base) MCG/ACT inhaler Inhale 1-2 puffs into the lungs every 4 (four) hours as needed for wheezing or shortness of breath. 18 each 5   albuterol (PROVENTIL) (2.5 MG/3ML) 0.083% nebulizer solution Take 3 mLs (2.5 mg total) by nebulization every 6 (six) hours as needed for wheezing or shortness of breath. 360 mL 6   aspirin 81 MG tablet Take 243 mg by mouth  daily. Takes 4 81mg  aspirin daily.     atorvastatin (LIPITOR) 40 MG tablet Take 1 tablet by mouth once daily 90 tablet 1   betamethasone valerate ointment (VALISONE) 0.1 % Apply 1 application topically 2 (two) times daily. For up to 10 days. 30 g 0   blood glucose meter kit and supplies KIT Dispense based on patient and insurance preference. Use up to four times daily as directed. (FOR ICD-9 250.00, 250.01). 1 each 0   Cholecalciferol (VITAMIN D3) 1.25 MG (50000 UT) CAPS TAKE 1 CAPSULE BY MOUTH  ONCE A WEEK 12 capsule 0    clotrimazole (LOTRIMIN) 1 % cream Apply 1 application 2 (two) times daily topically. For 7 days 28 g 0   Continuous Blood Gluc Sensor (FREESTYLE LIBRE 3 SENSOR) MISC 1 each by Does not apply route every 14 (fourteen) days. Place 1 sensor on the skin every 14 days. Use to check glucose continuously 2 each 5   cyclobenzaprine (FLEXERIL) 10 MG tablet Take 1 tablet (10 mg total) by mouth at bedtime as needed for muscle spasms. 15 tablet 0   DULoxetine (CYMBALTA) 60 MG capsule Take 1 capsule (60 mg total) by mouth daily. 90 capsule 1   fluticasone-salmeterol (ADVAIR) 250-50 MCG/ACT AEPB INHALE 1 DOSE BY MOUTH EVERY 12 HOURS 60 each 0   furosemide (LASIX) 40 MG tablet TAKE 1 TABLET BY MOUTH ONCE DAILY AS NEEDED 90 tablet 1   gabapentin (NEURONTIN) 300 MG capsule TAKE 1 CAPSULE BY MOUTH THREE TIMES DAILY 270 capsule 0   ibuprofen (ADVIL) 800 MG tablet Take 1 tablet (800 mg total) by mouth every 8 (eight) hours as needed. 60 tablet 0   metFORMIN (GLUCOPHAGE) 1000 MG tablet TAKE 1 TABLET BY MOUTH TWICE DAILY WITH MEALS 180 tablet 0   metoprolol tartrate (LOPRESSOR) 25 MG tablet Take 1 tablet by mouth twice daily 180 tablet 2   ondansetron (ZOFRAN) 8 MG tablet Take 1 tablet (8 mg total) by mouth every 8 (eight) hours as needed for nausea or vomiting. 12 tablet 0   oxyCODONE-acetaminophen (PERCOCET/ROXICET) 5-325 MG tablet Take 0.5 tablets by mouth 2 (two) times daily as needed for severe pain. 30 tablet 0   potassium chloride SA (KLOR-CON) 20 MEQ tablet Take 1 tablet (20 mEq total) by mouth daily. (Patient taking differently: Take 20 mEq by mouth once a week.) 90 tablet 0   Semaglutide, 2 MG/DOSE, 8 MG/3ML SOPN Inject 2 mg as directed once a week. 9 mL 3   tamsulosin (FLOMAX) 0.4 MG CAPS capsule Take 1 capsule by mouth once daily 90 capsule 1   predniSONE (DELTASONE) 20 MG tablet 3 tabs by mouth daily x 3 days, then 2 tabs by mouth daily x 2 days then 1 tab by mouth daily x 2 days 15 tablet 0   No  facility-administered medications prior to visit.     Per HPI unless specifically indicated in ROS section below Review of Systems  Constitutional:  Negative for fatigue and fever.  HENT:  Negative for ear pain.   Eyes:  Negative for pain.  Respiratory:  Negative for cough and shortness of breath.   Cardiovascular:  Negative for chest pain, palpitations and leg swelling.  Gastrointestinal:  Negative for abdominal pain.  Genitourinary:  Negative for dysuria.  Musculoskeletal:  Positive for arthralgias, back pain and neck pain.  Neurological:  Negative for syncope, light-headedness and headaches.  Psychiatric/Behavioral:  Negative for dysphoric mood.    Objective:  BP 136/82   Pulse Marland Kitchen)  59   Temp 98.8 F (37.1 C) (Oral)   Ht 5' 8.5" (1.74 m)   Wt 287 lb 8 oz (130.4 kg)   SpO2 95%   BMI 43.08 kg/m   Wt Readings from Last 3 Encounters:  03/29/23 287 lb 8 oz (130.4 kg)  10/04/22 286 lb (129.7 kg)  09/25/22 288 lb (130.6 kg)      Physical Exam Constitutional:      General: He is not in acute distress.    Appearance: Normal appearance. He is well-developed. He is obese. He is not ill-appearing or toxic-appearing.  HENT:     Head: Normocephalic and atraumatic.     Right Ear: Hearing, tympanic membrane, ear canal and external ear normal.     Left Ear: Hearing, tympanic membrane, ear canal and external ear normal.     Nose: Nose normal.     Mouth/Throat:     Pharynx: Uvula midline.  Eyes:     General: Lids are normal. Lids are everted, no foreign bodies appreciated.     Conjunctiva/sclera: Conjunctivae normal.     Pupils: Pupils are equal, round, and reactive to light.  Neck:     Thyroid: No thyroid mass or thyromegaly.     Vascular: No carotid bruit.     Trachea: Trachea and phonation normal.  Cardiovascular:     Rate and Rhythm: Normal rate and regular rhythm.     Pulses: Normal pulses.     Heart sounds: S1 normal and S2 normal. No murmur heard.    No gallop.   Pulmonary:     Breath sounds: Normal breath sounds. No wheezing, rhonchi or rales.  Abdominal:     General: Bowel sounds are normal.     Palpations: Abdomen is soft.     Tenderness: There is no abdominal tenderness. There is no guarding or rebound.     Hernia: No hernia is present.  Musculoskeletal:     Cervical back: Normal range of motion and neck supple.  Lymphadenopathy:     Cervical: No cervical adenopathy.  Skin:    General: Skin is warm and dry.     Findings: No rash.  Neurological:     Mental Status: He is alert.     Cranial Nerves: No cranial nerve deficit.     Sensory: No sensory deficit.     Gait: Gait normal.     Deep Tendon Reflexes: Reflexes are normal and symmetric.  Psychiatric:        Speech: Speech normal.        Behavior: Behavior normal.        Judgment: Judgment normal.       Results for orders placed or performed in visit on 01/09/23  PSA  Result Value Ref Range   PSA 0.69 0.10 - 4.00 ng/mL  Microalbumin / creatinine urine ratio  Result Value Ref Range   Microalb, Ur 5.6 (H) 0.0 - 1.9 mg/dL   Creatinine,U 629.5 mg/dL   Microalb Creat Ratio 1.5 0.0 - 30.0 mg/g  Lipid panel  Result Value Ref Range   Cholesterol 96 0 - 200 mg/dL   Triglycerides 28.4 0.0 - 149.0 mg/dL   HDL 13.24 (L) >40.10 mg/dL   VLDL 27.2 0.0 - 53.6 mg/dL   LDL Cholesterol 45 0 - 99 mg/dL   Total CHOL/HDL Ratio 2    NonHDL 57.10   Hemoglobin A1c  Result Value Ref Range   Hgb A1c MFr Bld 6.7 (H) 4.6 - 6.5 %  Comprehensive metabolic panel  Result Value Ref Range   Sodium 141 135 - 145 mEq/L   Potassium 4.0 3.5 - 5.1 mEq/L   Chloride 99 96 - 112 mEq/L   CO2 33 (H) 19 - 32 mEq/L   Glucose, Bld 125 (H) 70 - 99 mg/dL   BUN 18 6 - 23 mg/dL   Creatinine, Ser 1.61 0.40 - 1.50 mg/dL   Total Bilirubin 1.2 0.2 - 1.2 mg/dL   Alkaline Phosphatase 61 39 - 117 U/L   AST 14 0 - 37 U/L   ALT 16 0 - 53 U/L   Total Protein 6.8 6.0 - 8.3 g/dL   Albumin 4.2 3.5 - 5.2 g/dL   GFR 09.60  >45.40 mL/min   Calcium 9.2 8.4 - 10.5 mg/dL     COVID 19 screen:  No recent travel or known exposure to COVID19 The patient denies respiratory symptoms of COVID 19 at this time. The importance of social distancing was discussed today.   Assessment and Plan The patient's preventative maintenance and recommended screening tests for an annual wellness exam were reviewed in full today. Brought up to date unless services declined.  Counselled on the importance of diet, exercise, and its role in overall health and mortality. The patient's FH and SH was reviewed, including their home life, tobacco status, and drug and alcohol status.   Vaccines: Uptodate with PNA,  Discussed COVID19 vaccine side effects and benefits. Strongly encouraged the patient to get the vaccine. Questions answered. Due for tdap and shingrix... he thinks he may have had td in 2015.  Refused flu vaccine.. had SE to vaccine in past. Prostate Cancer Screen:  Lab Results  Component Value Date   PSA 0.69 01/09/2023   PSA 0.22 01/05/2022   PSA 0.24 10/19/2020  Colon Cancer Screen:  Cologuard 10/2020 repeat in 3 years      Smoking Status: former, quit 4 years ago.. referred to lung cancer screening program. ETOH/ drug use: none/none  Hep C:  negative   Problem List Items Addressed This Visit     BMI 40.0-44.9, adult (HCC)    He has continued to have weight loss on Ozempic at 2 mg weekly.        COPD (chronic obstructive pulmonary disease) (HCC) (Chronic)    Chronic, stable  Using albuterol as needed and Advair 250/151 puff twice daily as controller      Relevant Medications   predniSONE (DELTASONE) 20 MG tablet   Depression, major, single episode, severe (HCC) (Chronic)    Chronic, moderate control  Seeing Shela Leff psychologist.        Diabetic neuropathy (HCC) (Chronic)    Stable, tolerable control on gabapentin      HFrEF (heart failure with reduced ejection fraction) (HCC) (Chronic)     Chronic, followed by Dr. Gery Pray cardiology Last ECHO 06/2022  Euvolemic in office today using 40 mg p.o. daily as needed      Hyperlipidemia associated with type 2 diabetes mellitus (HCC) (Chronic)    Stable, chronic.  Continue current medication.   at goal < 70 on atorvastatin 40 mg p.o. daily      Hypertension associated with diabetes (HCC) (Chronic)    Stable  chronic.    Now HR improved, BP remains well controlled off metoprolol.       Obesity hypoventilation syndrome (HCC) (Chronic)    Working on weight loss with Ozempic.  On oxygen with ambulation and sleep.      Right shoulder pain    Acute, significant  improvement with prednisone.  Orthopedic referral in process.  Will extend prednisone taper.      Severe obstructive sleep apnea (Chronic)     On CPAP      Type 2 diabetes, controlled, with peripheral neuropathy (HCC) (Chronic)    Chronic, well-controlled with weight loss, low-carb diet, regular exercise.  Semaglutide to 2 mg injected weekly Metformin 1000 mg p.o. daily.       Other Visit Diagnoses     Routine general medical examination at a health care facility    -  Primary   Former tobacco use       Relevant Orders   Ambulatory Referral Lung Cancer Screening [facility]         Kerby Nora, MD

## 2023-04-04 ENCOUNTER — Ambulatory Visit: Payer: 59 | Admitting: Orthopaedic Surgery

## 2023-04-24 ENCOUNTER — Ambulatory Visit: Payer: 59 | Admitting: Orthopaedic Surgery

## 2023-04-26 DIAGNOSIS — G4733 Obstructive sleep apnea (adult) (pediatric): Secondary | ICD-10-CM | POA: Diagnosis not present

## 2023-04-28 DIAGNOSIS — J449 Chronic obstructive pulmonary disease, unspecified: Secondary | ICD-10-CM | POA: Diagnosis not present

## 2023-05-03 ENCOUNTER — Ambulatory Visit: Payer: 59 | Admitting: Orthopaedic Surgery

## 2023-05-03 ENCOUNTER — Encounter: Payer: Self-pay | Admitting: Orthopaedic Surgery

## 2023-05-03 DIAGNOSIS — G8929 Other chronic pain: Secondary | ICD-10-CM | POA: Diagnosis not present

## 2023-05-03 DIAGNOSIS — M25512 Pain in left shoulder: Secondary | ICD-10-CM | POA: Diagnosis not present

## 2023-05-03 NOTE — Progress Notes (Signed)
Office Visit Note   Patient: Jonathon Graham           Date of Birth: 05/09/1956           MRN: [id number] Visit Date: 05/03/2023              Requested by: Excell Seltzer, MD [address] [place],  [place] [address] PCP: Excell Seltzer, MD   Assessment & Plan: Visit Diagnoses:  1. Chronic left shoulder pain     Plan: Jonathon Graham is a 66 year old gentleman with chronic left shoulder pain for about 2 years.  I suspect that he has rotator cuff arthropathy from the traumatic cuff tear from 9 years ago that was not addressed.  Overall the shoulder function is fairly well compensated.  I will send him to outpatient PT and set him up with an injection with Dr. Shon Baton.  Follow-Up Instructions: No follow-ups on file.   Orders:  No orders of the defined types were placed in this encounter.  No orders of the defined types were placed in this encounter.     Procedures: No procedures performed   Clinical Data: No additional findings.   Subjective: Chief Complaint  Patient presents with   Left Shoulder - Pain    HPI Patient is a very pleasant 67 year old gentleman who comes in for evaluation of periodic left shoulder pain for 2 years.  Had a mechanical fall about 9 years ago that started the shoulder pain but did not really seek any medical attention.  He has flareups from time to time.  His PCP recently gave him a round of oral steroids that really improved his symptoms.  Review of Systems  Constitutional: Negative.   HENT: Negative.    Eyes: Negative.   Respiratory: Negative.    Cardiovascular: Negative.   Gastrointestinal: Negative.   Endocrine: Negative.   Genitourinary: Negative.   Skin: Negative.   Allergic/Immunologic: Negative.   Neurological: Negative.   Hematological: Negative.   Psychiatric/Behavioral: Negative.    All other systems reviewed and are negative.    Objective: Vital Signs: There were no vitals taken for this visit.  Physical Exam Vitals  and nursing note reviewed.  Constitutional:      Appearance: He is well-developed.  HENT:     Head: Normocephalic and atraumatic.  Eyes:     Pupils: Pupils are equal, round, and reactive to light.  Pulmonary:     Effort: Pulmonary effort is normal.  Abdominal:     Palpations: Abdomen is soft.  Musculoskeletal:        General: Normal range of motion.     Cervical back: Neck supple.  Skin:    General: Skin is warm.  Neurological:     Mental Status: He is alert and oriented to person, place, and time.  Psychiatric:        Behavior: Behavior normal.        Thought Content: Thought content normal.        Judgment: Judgment normal.     Ortho Exam Exam of the left shoulder shows flexion to 120 abduction to 90 external rotation to 45.  No shrug compensation.  Pain and mild weakness with empty can.  No impingement signs. Specialty Comments:  No specialty comments available.  Imaging:  X-rays of the left shoulder from PACS independently reviewed and interpreted shows slightly high riding humeral head consistent with rotator cuff arthropathy.  PMFS History: Patient Active Problem List   Diagnosis Date Noted  Acute pain of left shoulder 03/22/2023   Left bundle branch block 06/20/2022   Spinal stenosis of lumbar region 07/14/2021   Left ventricular dysfunction 03/08/2021   Chronic pain syndrome 02/18/2021   HFrEF (heart failure with reduced ejection fraction) (HCC) 01/21/2021   Hypokalemia 12/07/2020   Bradycardia 12/07/2020   Cardiomegaly 12/07/2020   Community acquired pneumonia of right lung 12/07/2020   Chronic midline low back pain 07/27/2020   Psoriasis 07/27/2020   Diabetic neuropathy (HCC) 07/27/2020   OA (osteoarthritis) 05/02/2019   Depression, major, single episode, severe (HCC) 08/23/2018   Obesity hypoventilation syndrome (HCC) 03/14/2017   Right shoulder pain 11/29/2016   BPH (benign prostatic hyperplasia) 03/14/2016   Hypertension associated with diabetes  (HCC) 03/14/2016   Fibromyalgia 03/14/2016   Hyperlipidemia associated with type 2 diabetes mellitus (HCC) 03/14/2016   Insomnia 03/14/2016   COPD (chronic obstructive pulmonary disease) (HCC) 11/26/2015   Severe obstructive sleep apnea 06/15/2015   BMI 40.0-44.9, adult (HCC) 05/26/2015   Type 2 diabetes, controlled, with peripheral neuropathy (HCC) 05/04/2015   Right hip pain 03/16/2015   Past Medical History:  Diagnosis Date   Arthritis    Benign prostatic hyperplasia (BPH) with urinary urgency    nodular   COPD (chronic obstructive pulmonary disease) (HCC)    Depression    Diabetes mellitus without complication (HCC)    Essential hypertension    Fibromyalgia, primary    Hyperlipidemia    Hypertension    Obesity     Family History  Problem Relation Age of Onset   Heart disease Father    Diabetes Mellitus II Sister    Myasthenia gravis Sister    Fibrocystic breast disease Sister    Hypertension Sister    Cancer Sister 55        sinus cancer   Diabetes Mellitus II Brother    Heart disease Mother     History reviewed. No pertinent surgical history. Social History   Occupational History   Occupation: retired  Tobacco Use   Smoking status: Former    Current packs/day: 0.00    Types: Cigarettes    Quit date: 03/28/2017    Years since quitting: 6.1   Smokeless tobacco: Never  Substance and Sexual Activity   Alcohol use: No    Alcohol/week: 0.0 standard drinks of alcohol   Drug use: No   Sexual activity: Yes

## 2023-05-03 NOTE — Addendum Note (Signed)
Addended by: Wendi Maya on: 05/03/2023 10:38 AM   Modules accepted: Orders

## 2023-05-17 ENCOUNTER — Encounter: Payer: 59 | Admitting: Sports Medicine

## 2023-05-26 DIAGNOSIS — G4733 Obstructive sleep apnea (adult) (pediatric): Secondary | ICD-10-CM | POA: Diagnosis not present

## 2023-05-27 ENCOUNTER — Other Ambulatory Visit: Payer: Self-pay | Admitting: Cardiovascular Disease

## 2023-05-27 DIAGNOSIS — E1169 Type 2 diabetes mellitus with other specified complication: Secondary | ICD-10-CM

## 2023-05-28 DIAGNOSIS — J449 Chronic obstructive pulmonary disease, unspecified: Secondary | ICD-10-CM | POA: Diagnosis not present

## 2023-05-31 ENCOUNTER — Other Ambulatory Visit: Payer: Self-pay | Admitting: Family Medicine

## 2023-05-31 DIAGNOSIS — N401 Enlarged prostate with lower urinary tract symptoms: Secondary | ICD-10-CM

## 2023-06-01 ENCOUNTER — Encounter: Payer: Self-pay | Admitting: Emergency Medicine

## 2023-06-18 ENCOUNTER — Other Ambulatory Visit: Payer: Self-pay | Admitting: Family Medicine

## 2023-06-18 DIAGNOSIS — M25552 Pain in left hip: Secondary | ICD-10-CM

## 2023-06-18 DIAGNOSIS — M48061 Spinal stenosis, lumbar region without neurogenic claudication: Secondary | ICD-10-CM

## 2023-06-18 NOTE — Telephone Encounter (Signed)
Copied from CRM [id number]. Topic: Clinical - Medication Refill >> Jun 18, 2023  1:21 PM Sim Boast F wrote: Most Recent Primary Care Visit:  Provider: Kerby Nora E  Department: [facility]  Visit Type: PHYSICAL  Date: 03/29/2023  Medication: OxyCODONE-acetaminophen (PERCOCET/ROXICET) 5-325 MG tablet  Has the patient contacted their pharmacy? Yes (Agent: If no, request that the patient contact the pharmacy for the refill. If patient does not wish to contact the pharmacy document the reason why and proceed with request.) (Agent: If yes, when and what did the pharmacy advise?)  Is this the correct pharmacy for this prescription? Yes If no, delete pharmacy and type the correct one.  This is the patient's preferred pharmacy:   [facility] [address], [place] - [address] [place] [address] Phone: [phone] Fax: [phone]   Has the prescription been filled recently? Yes  Is the patient out of the medication? Yes, SINCE FRIDAY   Has the patient been seen for an appointment in the last year OR does the patient have an upcoming appointment? Yes  Can we respond through MyChart? No  Agent: Please be advised that Rx refills may take up to 3 business days. We ask that you follow-up with your pharmacy.

## 2023-06-19 MED ORDER — OXYCODONE-ACETAMINOPHEN 5-325 MG PO TABS: 0.5000 | ORAL_TABLET | Freq: Two times a day (BID) | ORAL | 0 refills | Status: DC | PRN

## 2023-06-20 NOTE — Telephone Encounter (Signed)
Mr. Krysinski notified as instructed by telephone.  Patient states understanding.   While on the phone he states that Dr. Ermalene Searing was going to set him up of a cancer screening test but he has not heard from anyone about getting the scheduled.  I see a note at his CPE:  referred to lung cancer screening program.  Looks like they have tried contacting him via telephone and MyChart. I call Mr. Medearis back and provide phone number [phone] so he can call them to schedule.

## 2023-06-23 ENCOUNTER — Other Ambulatory Visit: Payer: Self-pay | Admitting: Family Medicine

## 2023-06-23 DIAGNOSIS — E119 Type 2 diabetes mellitus without complications: Secondary | ICD-10-CM

## 2023-06-25 ENCOUNTER — Encounter: Payer: Self-pay | Admitting: Internal Medicine

## 2023-06-25 ENCOUNTER — Ambulatory Visit (INDEPENDENT_AMBULATORY_CARE_PROVIDER_SITE_OTHER): Payer: 59 | Admitting: Internal Medicine

## 2023-06-25 VITALS — BP 136/86 | HR 62 | Temp 98.3°F | Ht 67.0 in | Wt 282.4 lb

## 2023-06-25 DIAGNOSIS — J410 Simple chronic bronchitis: Secondary | ICD-10-CM

## 2023-06-25 DIAGNOSIS — G4733 Obstructive sleep apnea (adult) (pediatric): Secondary | ICD-10-CM | POA: Diagnosis not present

## 2023-06-25 MED ORDER — ALBUTEROL SULFATE HFA 108 (90 BASE) MCG/ACT IN AERS: 1.0000 | INHALATION_SPRAY | RESPIRATORY_TRACT | 12 refills | Status: AC | PRN

## 2023-06-25 NOTE — Patient Instructions (Addendum)
Order- DME [employer]- please replace old autoBIPAP machine,  max Insp 25, min Exp 16, PS 5, mask of choice, humidifier, supplies, AirView/ card Please instruct patient how to adjust humidifier.  Continue O2 3 L  You can look at the booklet with your BIPAP machine for how to adjust the humidifier.  Script sent refilling albuterol rescue inhaler

## 2023-06-25 NOTE — Progress Notes (Signed)
06/25/23- 68 yoM followed for OSA, complicated by OHS with Chronic Respiratory Failure (hypoxic, Hypercapneic), CAD, CHF, HTN, Hyperlipidemia -Albuterol hfa, Neb Albuterol, Advair 250, NPSG 07/14/17 AHI 139.7, desat to 55%, 342 lbs BIPAP auto max I 25, Min E 16, PS 5 / [facility]    this machine new 2019   Has 2 machines. Last seen by Dr Craige Cotta 2023 Download compliance- N/A Body weight today 282 lbs He carried a POC here but cut it off in lobby- room air sat 93%. Using O2 in lieu of BIPAP. This SD card is blank.  Discussed the use of AI scribe software for clinical note transcription with the patient, who gave verbal consent to proceed.  History of Present Illness   The patient, with severe sleep apnea, has not been using his BiPAP machine for approximately two months due to throat dryness . The dryness was so severe that it necessitated a hospital visit. He was not aware that the machine's humidifier could be adjusted to alleviate this issue. He has been using Biotene mouthwash at night to help with the dryness. In the absence of the BiPAP machine, he has been using his oxygen concentrator, set at three, during the day as needed. He also has a persistent cough, producing clear phlegm. He has been using Advair and albuterol inhalers, but has not received albuterol from the pharmacy in approximately six months. The patient also reports a significant weight loss, from 394 lbs to 280 lbs, and plans to continue this trend. He attributes some of this weight loss to the use of Ozempic, which has decreased his appetite.     ROS-see HPI   + = positive Constitutional:    weight loss, night sweats, fevers, chills, fatigue, lassitude. HEENT:    headaches, difficulty swallowing, tooth/dental problems, sore throat,       sneezing, itching, ear ache, nasal congestion, post nasal drip, snoring CV:    chest pain, orthopnea, PND, swelling in lower extremities, anasarca,                                   dizziness,  palpitations Resp:   shortness of breath with exertion or at rest.                productive cough,   non-productive cough, coughing up of blood.              change in color of mucus.  wheezing.   Skin:    rash or lesions. GI:  No-   heartburn, indigestion, abdominal pain, nausea, vomiting, diarrhea,                 change in bowel habits, loss of appetite GU: dysuria, change in color of urine, no urgency or frequency.   flank pain. MS:   joint pain, stiffness, decreased range of motion, back pain. Neuro-     nothing unusual Psych:  change in mood or affect.  depression or anxiety.   memory loss.  OBJ- Physical Exam General- Alert, Oriented, Affect-appropriate, Distress- none acute, Obese Skin- rash-none, lesions- none, excoriation- none Lymphadenopathy- none Head- atraumatic            Eyes- Gross vision intact, PERRLA, conjunctivae and secretions clear            Ears- Hearing, canals-normal            Nose- Clear, no-Septal dev, mucus, polyps, erosion, perforation  Throat- Mallampati  , mucosa clear , drainage- none, tonsils- atrophic, missing teeth Neck- flexible , trachea midline, no stridor , thyroid nl, carotid no bruit Chest - symmetrical excursion , unlabored           Heart/CV- RRR , no murmur , no gallop  , no rub, nl s1 s2                           - JVD- none , edema- none, stasis changes- none, varices- none           Lung- clear to P&A, wheeze+slight, cough- none , dullness-none, rub- none           Chest wall-  Abd-  Br/ Gen/ Rectal- Not done, not indicated Extrem- cyanosis- none, clubbing, none, atrophy- none, strength- nl Neuro- grossly intact to observation  Assessment and Plan    Obstructive Sleep Apnea Non-compliance with BiPAP therapy due to dry throa. Patient has been using oxygen concentrator instead. Discussed the importance of BiPAP therapy for maintaining airway patency and reducing stress on the heart and brain. -Adjust humidifier settings  on BiPAP machine as per the instruction manual. -Use Biotene mouthwash before bed to alleviate dry mouth. -Order replacement of BiPAP machine, now over 66 years old, through [facility] home [facility] company. -Refill Albuterol inhaler at [facility] on [address].  Chronic Bronchitis Reports routine cough with clear phlegm. Auscultation revealed wheezing. -Continue Advair and Albuterol inhaler as needed.  Obesity Patient has lost significant weight (from 394 lbs to 280 lbs) and plans to continue weight loss efforts. Discussed the positive impact of weight loss on respiratory health. -Encourage continued weight loss efforts.  General Health Maintenance -Continue Ozempic for weight loss and blood sugar control.

## 2023-06-27 DIAGNOSIS — G4733 Obstructive sleep apnea (adult) (pediatric): Secondary | ICD-10-CM | POA: Diagnosis not present

## 2023-06-28 DIAGNOSIS — J449 Chronic obstructive pulmonary disease, unspecified: Secondary | ICD-10-CM | POA: Diagnosis not present

## 2023-06-29 ENCOUNTER — Encounter: Payer: Self-pay | Admitting: Cardiovascular Disease

## 2023-06-29 ENCOUNTER — Ambulatory Visit: Payer: 59 | Attending: Cardiovascular Disease | Admitting: Cardiovascular Disease

## 2023-06-29 VITALS — BP 130/58 | HR 70 | Ht 67.0 in | Wt 282.0 lb

## 2023-06-29 DIAGNOSIS — E785 Hyperlipidemia, unspecified: Secondary | ICD-10-CM

## 2023-06-29 DIAGNOSIS — I7121 Aneurysm of the ascending aorta, without rupture: Secondary | ICD-10-CM | POA: Diagnosis not present

## 2023-06-29 DIAGNOSIS — I152 Hypertension secondary to endocrine disorders: Secondary | ICD-10-CM | POA: Diagnosis not present

## 2023-06-29 DIAGNOSIS — E1169 Type 2 diabetes mellitus with other specified complication: Secondary | ICD-10-CM

## 2023-06-29 DIAGNOSIS — E1159 Type 2 diabetes mellitus with other circulatory complications: Secondary | ICD-10-CM | POA: Diagnosis not present

## 2023-06-29 DIAGNOSIS — I4510 Unspecified right bundle-branch block: Secondary | ICD-10-CM | POA: Diagnosis not present

## 2023-06-29 DIAGNOSIS — I5020 Unspecified systolic (congestive) heart failure: Secondary | ICD-10-CM

## 2023-06-29 NOTE — Assessment & Plan Note (Signed)
History of hyperlipidemia on atorvastatin with lipid profile performed 01/09/2023 revealing total cholesterol 96, LDL 45 and HDL 38.

## 2023-06-29 NOTE — Assessment & Plan Note (Signed)
2D echo performed 07/13/2022 revealing ascending thoracic aorta measuring 45 mm.  This will be repeated in 12 months.

## 2023-06-29 NOTE — Assessment & Plan Note (Signed)
History of essential hypertension blood pressure measured today at 130/58.  He is on metoprolol.

## 2023-06-29 NOTE — Assessment & Plan Note (Signed)
 Chronic.

## 2023-06-29 NOTE — Patient Instructions (Signed)
    Follow-Up: At [facility], you and your health needs are our priority.  As part of our continuing mission to provide you with exceptional heart care, we have created designated Provider Care Teams.  These Care Teams include your primary Cardiologist (physician) and Advanced Practice Providers (APPs -  Physician Assistants and Nurse Practitioners) who all work together to provide you with the care you need, when you need it.   Your next appointment:   12 month(s)  Provider:   Nanetta Batty

## 2023-06-29 NOTE — Assessment & Plan Note (Signed)
History of heart failure with minimally reduced EF in the past.  We are limited as far as GDMT because of soft blood pressure.  He is on a beta-blocker and his most recent 2D echo performed 07/12/2022 revealed normal LV systolic function with an ascending aorta measuring 45 mm.  He is completely asymptomatic.

## 2023-06-29 NOTE — Progress Notes (Signed)
06/29/2023 Jonathon Graham   08/30/1955  [id number]  Primary Physician Excell Seltzer, MD Primary Cardiologist: Runell Gess MD Nicholes Calamity, [place]  HPI:  Jonathon Graham is a 68 y.o. severely overweight married African-American male father of 3, grandfather of 10 grandchildren and great grandfather to 77 great grandchildren was referred by Lisabeth Pick, MD, for evaluation of LV dysfunction.  He is disabled because of back issues.  I last saw him in the office 06/20/2022.  His risk factors include 15 pack years of tobacco abuse having quit last year, treated hyperlipidemia and diabetes.  There is no family history of heart disease.  He is never had a heart tach or stroke.  He denies chest pain or shortness of breath.  He just renovated his house by himself without limitation.  He has lost 100 pounds over the last year to by diet and exercise.  He also contracted COVID back in April of this year and only had minimal symptoms.  A 2D echocardiogram performed 01/18/2021 revealed an EF of 40 to 45% without valvular abnormalities.  Etiology of his LV dysfunction is still unclear.  I began him on low-dose beta-blocker (metoprolol 12.5 mg p.o. twice daily) and had our Pharm.D.'s to titrate this as an outpatient but unfortunately because of "soft blood pressure this could not occur.  He did have a follow-up 2D echo performed 06/06/2021 revealing a similar EF to his previous echo of 45% with grade 1 diastolic dysfunction.  There are no valvular abnormalities.  A coronary CTA performed 03/17/2021 revealed a coronary calcium score of 128 with no obstructive CAD noted.   Since I saw him a year ago he continues to do well.  His weight is stable although he is trying to lose weight.    He denies chest pain or shortness of breath.  He walks approxione 0.5 miles a day without limitation or symptoms.     Current Meds  Medication Sig   albuterol (PROAIR HFA) 108 (90 Base) MCG/ACT inhaler Inhale 1-2 puffs into  the lungs every 4 (four) hours as needed for wheezing or shortness of breath.   albuterol (PROVENTIL) (2.5 MG/3ML) 0.083% nebulizer solution Take 3 mLs (2.5 mg total) by nebulization every 6 (six) hours as needed for wheezing or shortness of breath.   aspirin 81 MG tablet Take 243 mg by mouth daily. Takes 4 81mg  aspirin daily.   atorvastatin (LIPITOR) 40 MG tablet Take 1 tablet by mouth once daily   betamethasone valerate ointment (VALISONE) 0.1 % Apply 1 application topically 2 (two) times daily. For up to 10 days.   blood glucose meter kit and supplies KIT Dispense based on patient and insurance preference. Use up to four times daily as directed. (FOR ICD-9 250.00, 250.01).   Cholecalciferol (VITAMIN D3) 1.25 MG (50000 UT) CAPS TAKE 1 CAPSULE BY MOUTH  ONCE A WEEK   clotrimazole (LOTRIMIN) 1 % cream Apply 1 application 2 (two) times daily topically. For 7 days   Continuous Blood Gluc Sensor (FREESTYLE LIBRE 3 SENSOR) MISC 1 each by Does not apply route every 14 (fourteen) days. Place 1 sensor on the skin every 14 days. Use to check glucose continuously   cyclobenzaprine (FLEXERIL) 10 MG tablet Take 1 tablet (10 mg total) by mouth at bedtime as needed for muscle spasms.   DULoxetine (CYMBALTA) 60 MG capsule Take 1 capsule (60 mg total) by mouth daily.   fluticasone-salmeterol (ADVAIR) 250-50 MCG/ACT AEPB INHALE 1 DOSE BY MOUTH EVERY  12 HOURS   furosemide (LASIX) 40 MG tablet TAKE 1 TABLET BY MOUTH ONCE DAILY AS NEEDED   gabapentin (NEURONTIN) 300 MG capsule TAKE 1 CAPSULE BY MOUTH THREE TIMES DAILY   ibuprofen (ADVIL) 800 MG tablet Take 1 tablet (800 mg total) by mouth every 8 (eight) hours as needed.   metFORMIN (GLUCOPHAGE) 1000 MG tablet TAKE 1 TABLET BY MOUTH TWICE DAILY WITH MEALS   metoprolol tartrate (LOPRESSOR) 25 MG tablet Take 1 tablet by mouth twice daily   ondansetron (ZOFRAN) 8 MG tablet Take 1 tablet (8 mg total) by mouth every 8 (eight) hours as needed for nausea or vomiting.    oxyCODONE-acetaminophen (PERCOCET/ROXICET) 5-325 MG tablet Take 0.5 tablets by mouth 2 (two) times daily as needed for severe pain (pain score 7-10).   potassium chloride SA (KLOR-CON) 20 MEQ tablet Take 1 tablet (20 mEq total) by mouth daily. (Patient taking differently: Take 20 mEq by mouth once a week.)   predniSONE (DELTASONE) 20 MG tablet 3 tabs by mouth daily x 3 days, then 2 tabs by mouth daily x 2 days then 1 tab by mouth daily x 2 days   Semaglutide, 2 MG/DOSE, 8 MG/3ML SOPN Inject 2 mg as directed once a week.   tamsulosin (FLOMAX) 0.4 MG CAPS capsule Take 1 capsule by mouth once daily     Allergies  Allergen Reactions   Penicillins Anaphylaxis and Swelling    Social History   Socioeconomic History   Marital status: Married    Spouse name: Not on file   Number of children: Not on file   Years of education: Not on file   Highest education level: Not on file  Occupational History   Occupation: retired  Tobacco Use   Smoking status: Former    Current packs/day: 0.00    Types: Cigarettes    Quit date: 03/28/2017    Years since quitting: 6.2   Smokeless tobacco: Never  Substance and Sexual Activity   Alcohol use: No    Alcohol/week: 0.0 standard drinks of alcohol   Drug use: No   Sexual activity: Yes  Other Topics Concern   Not on file  Social History Narrative   Not on file   Social Drivers of Health   Financial Resource Strain: Low Risk  (09/25/2022)   Overall Financial Resource Strain (CARDIA)    Difficulty of Paying Living Expenses: Not hard at all  Food Insecurity: No Food Insecurity (09/25/2022)   Hunger Vital Sign    Worried About Running Out of Food in the Last Year: Never true    Ran Out of Food in the Last Year: Never true  Transportation Needs: No Transportation Needs (09/25/2022)   PRAPARE - Administrator, Civil Service (Medical): No    Lack of Transportation (Non-Medical): No  Physical Activity: Sufficiently Active (09/25/2022)   Exercise  Vital Sign    Days of Exercise per Week: 2 days    Minutes of Exercise per Session: 90 min  Stress: No Stress Concern Present (09/25/2022)   [employer] of Occupational Health - Occupational Stress Questionnaire    Feeling of Stress : Not at all  Social Connections: Moderately Integrated (09/25/2022)   Social Connection and Isolation Panel [NHANES]    Frequency of Communication with Friends and Family: Once a week    Frequency of Social Gatherings with Friends and Family: Once a week    Attends Religious Services: More than 4 times per year    Active Member of  Clubs or Organizations: Yes    Attends Banker Meetings: More than 4 times per year    Marital Status: Married  Catering manager Violence: Not At Risk (09/25/2022)   Humiliation, Afraid, Rape, and Kick questionnaire    Fear of Current or Ex-Partner: No    Emotionally Abused: No    Physically Abused: No    Sexually Abused: No     Review of Systems: General: negative for chills, fever, night sweats or weight changes.  Cardiovascular: negative for chest pain, dyspnea on exertion, edema, orthopnea, palpitations, paroxysmal nocturnal dyspnea or shortness of breath Dermatological: negative for rash Respiratory: negative for cough or wheezing Urologic: negative for hematuria Abdominal: negative for nausea, vomiting, diarrhea, bright red blood per rectum, melena, or hematemesis Neurologic: negative for visual changes, syncope, or dizziness All other systems reviewed and are otherwise negative except as noted above.    Blood pressure (!) 130/58, pulse 70, height 5\' 7"  (1.702 m), weight 282 lb (127.9 kg), SpO2 92%.  General appearance: alert and no distress Neck: no adenopathy, no carotid bruit, no JVD, supple, symmetrical, trachea midline, and thyroid not enlarged, symmetric, no tenderness/mass/nodules Lungs: clear to auscultation bilaterally Heart: regular rate and rhythm, S1, S2 normal, no murmur, click, rub or  gallop Extremities: extremities normal, atraumatic, no cyanosis or edema Pulses: 2+ and symmetric Skin: Skin color, texture, turgor normal. No rashes or lesions Neurologic: Grossly normal  EKG EKG Interpretation Date/Time:  Friday June 29 2023 09:29:32 EST Ventricular Rate:  70 PR Interval:  242 QRS Duration:  180 QT Interval:  464 QTC Calculation: 501 R Axis:   260  Text Interpretation: Sinus rhythm with 1st degree A-V block Right bundle branch block Anteroseptal infarct , age undetermined T wave abnormality, consider lateral ischemia When compared with ECG of 24-Mar-2017 14:53, PR interval has increased Anteroseptal infarct is now Present Confirmed by Nanetta Batty [phone]) on 06/29/2023 9:41:00 AM    ASSESSMENT AND PLAN:   Hypertension associated with diabetes (HCC) History of essential hypertension blood pressure measured today at 130/58.  He is on metoprolol.  Hyperlipidemia associated with type 2 diabetes mellitus (HCC) History of hyperlipidemia on atorvastatin with lipid profile performed 01/09/2023 revealing total cholesterol 96, LDL 45 and HDL 38.  HFrEF (heart failure with reduced ejection fraction) (HCC) History of heart failure with minimally reduced EF in the past.  We are limited as far as GDMT because of soft blood pressure.  He is on a beta-blocker and his most recent 2D echo performed 07/12/2022 revealed normal LV systolic function with an ascending aorta measuring 45 mm.  He is completely asymptomatic.  Right bundle branch block Chronic  Thoracic ascending aortic aneurysm (HCC) 2D echo performed 07/13/2022 revealing ascending thoracic aorta measuring 45 mm.  This will be repeated in 12 months.     Runell Gess MD FACP,FACC,FAHA, [facility] 06/29/2023 9:49 AM

## 2023-07-10 DIAGNOSIS — G4733 Obstructive sleep apnea (adult) (pediatric): Secondary | ICD-10-CM | POA: Diagnosis not present

## 2023-07-12 ENCOUNTER — Other Ambulatory Visit (HOSPITAL_COMMUNITY): Payer: Self-pay

## 2023-07-12 ENCOUNTER — Ambulatory Visit (HOSPITAL_COMMUNITY)
Admission: RE | Admit: 2023-07-12 | Discharge: 2023-07-12 | Disposition: A | Discharge status: Home / Self Care | Payer: 59 | Source: Ambulatory Visit | Attending: Cardiovascular Disease | Admitting: Cardiovascular Disease

## 2023-07-12 DIAGNOSIS — I152 Hypertension secondary to endocrine disorders: Secondary | ICD-10-CM | POA: Diagnosis not present

## 2023-07-12 DIAGNOSIS — E1169 Type 2 diabetes mellitus with other specified complication: Secondary | ICD-10-CM | POA: Insufficient documentation

## 2023-07-12 DIAGNOSIS — E785 Hyperlipidemia, unspecified: Secondary | ICD-10-CM | POA: Insufficient documentation

## 2023-07-12 DIAGNOSIS — I7121 Aneurysm of the ascending aorta, without rupture: Secondary | ICD-10-CM

## 2023-07-12 DIAGNOSIS — E1159 Type 2 diabetes mellitus with other circulatory complications: Secondary | ICD-10-CM | POA: Diagnosis not present

## 2023-07-12 DIAGNOSIS — I5020 Unspecified systolic (congestive) heart failure: Secondary | ICD-10-CM

## 2023-07-12 DIAGNOSIS — I10 Essential (primary) hypertension: Secondary | ICD-10-CM | POA: Diagnosis not present

## 2023-07-12 LAB — ECHOCARDIOGRAM COMPLETE
AR max vel: 1.34 cm2
AV Area VTI: 1.3 cm2
AV Area mean vel: 1.18 cm2
AV Mean grad: 6 mm[Hg]
AV Peak grad: 11.2 mm[Hg]
Ao pk vel: 1.67 m/s
MV M vel: 1.43 m/s
MV Peak grad: 8.2 mm[Hg]
S' Lateral: 4.5 cm

## 2023-07-12 MED ORDER — PERFLUTREN LIPID MICROSPHERE
1.0000 mL | INTRAVENOUS | Status: AC | PRN
  Administered 2023-07-12: 2 mL via INTRAVENOUS

## 2023-07-21 ENCOUNTER — Other Ambulatory Visit: Payer: Self-pay | Admitting: Cardiovascular Disease

## 2023-07-21 DIAGNOSIS — E1169 Type 2 diabetes mellitus with other specified complication: Secondary | ICD-10-CM

## 2023-07-26 ENCOUNTER — Ambulatory Visit: Payer: 59 | Admitting: Primary Care

## 2023-07-27 DIAGNOSIS — J449 Chronic obstructive pulmonary disease, unspecified: Secondary | ICD-10-CM | POA: Diagnosis not present

## 2023-07-28 DIAGNOSIS — G4733 Obstructive sleep apnea (adult) (pediatric): Secondary | ICD-10-CM | POA: Diagnosis not present

## 2023-08-01 ENCOUNTER — Ambulatory Visit: Payer: Self-pay | Admitting: Family Medicine

## 2023-08-01 NOTE — Telephone Encounter (Signed)
 Noted. Will see patient.

## 2023-08-01 NOTE — Telephone Encounter (Signed)
 Chief Complaint: Chest Pain Symptoms: belching Frequency: Began Saturday Pertinent Negatives: Patient denies fever, N/V/D Disposition: [] ED /[] Urgent Care (no appt availability in office) / [x] Appointment(In office/virtual)/ []  [place] [facility]/ [] Home Care/ [] Refused Recommended Disposition /[] [place] [facility]/ []  Follow-up with PCP Additional Notes: Pt reports upper chest pain under breast line, he reports when he drinks tea and belches it is temporarily relieved but returns soon after. Pt has COPD but denies any changes or worsening of his breathing. Reports he is coughing up yellow sputum intermittently. OV scheduled tomorrow AM. This RN educated pt on home care, new-worsening symptoms, when to call back/seek emergent care. Pt verbalized understanding and agrees to plan.    Copied from CRM [id number]. Topic: Clinical - Red Word Triage >> Aug 01, 2023  4:23 PM Sim Boast F wrote: Red Word that prompted transfer to Nurse Triage: Patient has been experiencing upper chest area aching pain for 3 days and it's uncomfortable especially when he bends Reason for Disposition  [1] Chest pain lasts > 5 minutes AND [2] occurred > 3 days ago (72 hours) AND [3] NO chest pain or cardiac symptoms now  Answer Assessment - Initial Assessment Questions 1. LOCATION: "Where does it hurt?"       Upper chest, under breast area 2. RADIATION: "Does the pain go anywhere else?" (e.g., into neck, jaw, arms, back)     None 3. ONSET: "When did the chest pain begin?" (Minutes, hours or days)      Saturday 4. PATTERN: "Does the pain come and go, or has it been constant since it started?"  "Does it get worse with exertion?"      Consistent 5. DURATION: "How long does it last" (e.g., seconds, minutes, hours)     Ongoing 6. SEVERITY: "How bad is the pain?"  (e.g., Scale 1-10; mild, moderate, or severe)    - MILD (1-3): doesn't interfere with normal activities     - MODERATE (4-7): interferes with normal  activities or awakens from sleep    - SEVERE (8-10): excruciating pain, unable to do any normal activities       6/10  9. CAUSE: "What do you think is causing the chest pain?"     Unknown 10. OTHER SYMPTOMS: "Do you have any other symptoms?" (e.g., dizziness, nausea, vomiting, sweating, fever, difficulty breathing, cough)       None  Protocols used: Chest Pain-A-AH

## 2023-08-02 ENCOUNTER — Encounter: Payer: Self-pay | Admitting: Family Medicine

## 2023-08-02 ENCOUNTER — Ambulatory Visit: Admitting: Family Medicine

## 2023-08-02 VITALS — BP 102/56 | HR 32 | Temp 97.2°F | Ht 67.0 in | Wt 288.0 lb

## 2023-08-02 DIAGNOSIS — R0789 Other chest pain: Secondary | ICD-10-CM | POA: Diagnosis not present

## 2023-08-02 DIAGNOSIS — E559 Vitamin D deficiency, unspecified: Secondary | ICD-10-CM

## 2023-08-02 DIAGNOSIS — R058 Other specified cough: Secondary | ICD-10-CM

## 2023-08-02 DIAGNOSIS — J439 Emphysema, unspecified: Secondary | ICD-10-CM

## 2023-08-02 DIAGNOSIS — R142 Eructation: Secondary | ICD-10-CM | POA: Diagnosis not present

## 2023-08-02 MED ORDER — AZITHROMYCIN 250 MG PO TABS: ORAL_TABLET | ORAL | 0 refills | Status: DC

## 2023-08-02 MED ORDER — PANTOPRAZOLE SODIUM 40 MG PO TBEC: 40.0000 mg | DELAYED_RELEASE_TABLET | Freq: Every day | ORAL | 0 refills | Status: DC

## 2023-08-02 NOTE — Progress Notes (Signed)
 Patient ID: Jonathon Graham, male    DOB: 1955/07/01, 68 y.o.   MRN: [id number]  This visit was conducted in person.  BP (!) 102/56   Pulse (!) 32   Temp (!) 97.2 F (36.2 C) (Temporal)   Ht 5\' 7"  (1.702 m)   Wt 288 lb (130.6 kg)   SpO2 92%   BMI 45.11 kg/m    CC:  Chief Complaint  Patient presents with   Chest Pain    Chest discomfort since Saturday. Pain scale 5 out of 10.  Coughing up some phlegm     Subjective:   HPI: Jonathon Graham is a 68 y.o. male presenting on 08/02/2023 for Chest Pain (Chest discomfort since Saturday. Pain scale 5 out of 10. /Coughing up some phlegm )   He was in [place] 5-6 days ago  Started noting chest  discomfort under breasts bilaterally.   Constant, initially 9/10 , but gradually improved. 4/10. Noted frequent burping 5 times a in a row, some increased burping.  24 hour prior to chest pain noted clear mucus... treated with mucinex. Sometimes yellow mucus.  Dry mouth... but may be from CPAP.   No SOB, no wheeze.  No peripheral swelling.  No weight  gain.   No sick contacts.   Patient with history of type 2 diabetes, severe obstructive sleep apnea, COPD, HFrEF   On ibuprofen   no ne in last month Eats  a lot of spicy food and tomatoe, coffee.   Recent eval at cardiology .[facility]  Relevant past medical, surgical, family and social history reviewed and updated as indicated. Interim medical history since our last visit reviewed. Allergies and medications reviewed and updated. Outpatient Medications Prior to Visit  Medication Sig Dispense Refill   albuterol (PROAIR HFA) 108 (90 Base) MCG/ACT inhaler Inhale 1-2 puffs into the lungs every 4 (four) hours as needed for wheezing or shortness of breath. 18 each 12   albuterol (PROVENTIL) (2.5 MG/3ML) 0.083% nebulizer solution Take 3 mLs (2.5 mg total) by nebulization every 6 (six) hours as needed for wheezing or shortness of breath. 360 mL 6   aspirin 81 MG tablet Take 243 mg by mouth daily.  Takes 4 81mg  aspirin daily.     atorvastatin (LIPITOR) 40 MG tablet Take 1 tablet by mouth once daily 30 tablet 0   betamethasone valerate ointment (VALISONE) 0.1 % Apply 1 application topically 2 (two) times daily. For up to 10 days. 30 g 0   blood glucose meter kit and supplies KIT Dispense based on patient and insurance preference. Use up to four times daily as directed. (FOR ICD-9 250.00, 250.01). 1 each 0   Cholecalciferol (VITAMIN D3) 1.25 MG (50000 UT) CAPS TAKE 1 CAPSULE BY MOUTH  ONCE A WEEK 12 capsule 0   clotrimazole (LOTRIMIN) 1 % cream Apply 1 application 2 (two) times daily topically. For 7 days 28 g 0   Continuous Blood Gluc Sensor (FREESTYLE LIBRE 3 SENSOR) MISC 1 each by Does not apply route every 14 (fourteen) days. Place 1 sensor on the skin every 14 days. Use to check glucose continuously 2 each 5   cyclobenzaprine (FLEXERIL) 10 MG tablet Take 1 tablet (10 mg total) by mouth at bedtime as needed for muscle spasms. 15 tablet 0   DULoxetine (CYMBALTA) 60 MG capsule Take 1 capsule (60 mg total) by mouth daily. 90 capsule 1   fluticasone-salmeterol (ADVAIR) 250-50 MCG/ACT AEPB INHALE 1 DOSE BY MOUTH EVERY 12 HOURS 60 each 0  furosemide (LASIX) 40 MG tablet TAKE 1 TABLET BY MOUTH ONCE DAILY AS NEEDED 90 tablet 1   gabapentin (NEURONTIN) 300 MG capsule TAKE 1 CAPSULE BY MOUTH THREE TIMES DAILY 270 capsule 0   ibuprofen (ADVIL) 800 MG tablet Take 1 tablet (800 mg total) by mouth every 8 (eight) hours as needed. 60 tablet 0   metFORMIN (GLUCOPHAGE) 1000 MG tablet TAKE 1 TABLET BY MOUTH TWICE DAILY WITH MEALS 180 tablet 1   metoprolol tartrate (LOPRESSOR) 25 MG tablet Take 1 tablet by mouth twice daily 180 tablet 2   ondansetron (ZOFRAN) 8 MG tablet Take 1 tablet (8 mg total) by mouth every 8 (eight) hours as needed for nausea or vomiting. 12 tablet 0   oxyCODONE-acetaminophen (PERCOCET/ROXICET) 5-325 MG tablet Take 0.5 tablets by mouth 2 (two) times daily as needed for severe pain (pain  score 7-10). 30 tablet 0   potassium chloride SA (KLOR-CON) 20 MEQ tablet Take 1 tablet (20 mEq total) by mouth daily. (Patient taking differently: Take 20 mEq by mouth once a week.) 90 tablet 0   predniSONE (DELTASONE) 20 MG tablet 3 tabs by mouth daily x 3 days, then 2 tabs by mouth daily x 2 days then 1 tab by mouth daily x 2 days 15 tablet 0   Semaglutide, 2 MG/DOSE, 8 MG/3ML SOPN Inject 2 mg as directed once a week. 9 mL 3   tamsulosin (FLOMAX) 0.4 MG CAPS capsule Take 1 capsule by mouth once daily 90 capsule 3   No facility-administered medications prior to visit.     Per HPI unless specifically indicated in ROS section below Review of Systems  Constitutional:  Negative for fatigue and fever.  HENT:  Positive for congestion. Negative for ear pain.   Eyes:  Negative for pain.  Respiratory:  Positive for cough. Negative for shortness of breath.   Cardiovascular:  Negative for chest pain, palpitations and leg swelling.  Gastrointestinal:  Negative for abdominal pain.  Genitourinary:  Negative for dysuria.  Musculoskeletal:  Negative for arthralgias.  Neurological:  Negative for syncope, light-headedness and headaches.  Psychiatric/Behavioral:  Negative for dysphoric mood.    Objective:  BP (!) 102/56   Pulse (!) 32   Temp (!) 97.2 F (36.2 C) (Temporal)   Ht 5\' 7"  (1.702 m)   Wt 288 lb (130.6 kg)   SpO2 92%   BMI 45.11 kg/m   Wt Readings from Last 3 Encounters:  08/02/23 288 lb (130.6 kg)  06/29/23 282 lb (127.9 kg)  06/25/23 282 lb 6.4 oz (128.1 kg)      Physical Exam Vitals reviewed.  Constitutional:      Appearance: He is well-developed.  HENT:     Head: Normocephalic.     Right Ear: Hearing normal.     Left Ear: Hearing normal.     Nose: Nose normal.  Neck:     Thyroid: No thyroid mass or thyromegaly.     Vascular: No carotid bruit.     Trachea: Trachea normal.  Cardiovascular:     Rate and Rhythm: Normal rate and regular rhythm.     Pulses: Normal pulses.      Heart sounds: Heart sounds not distant. No murmur heard.    No friction rub. No gallop.     Comments: No peripheral edema Pulmonary:     Effort: Pulmonary effort is normal. No respiratory distress.     Breath sounds: Normal breath sounds. No decreased breath sounds, wheezing, rhonchi or rales.  Chest:  Chest wall: No mass, tenderness or crepitus.       Comments: Area of pain Skin:    General: Skin is warm and dry.     Findings: No rash.  Psychiatric:        Speech: Speech normal.        Behavior: Behavior normal.        Thought Content: Thought content normal.       Results for orders placed or performed during the hospital encounter of 07/12/23  ECHOCARDIOGRAM COMPLETE   Collection Time: 07/12/23 10:26 AM  Result Value Ref Range   S' Lateral 4.50 cm   AR max vel 1.34 cm2   AV Area VTI 1.30 cm2   AV Area mean vel 1.18 cm2   MV M vel 1.43 m/s   MV Peak grad 8.2 mmHg   AV Mean grad 6.0 mmHg   AV Peak grad 11.2 mmHg   Ao pk vel 1.67 m/s   Est EF 50 - 55%     Assessment and Plan   Chest pressure Assessment & Plan: Acute, description appears to be noncardiac.  Symptoms most consistent with gastroesophageal reflux disease.  With congestion, mucus color change in the setting of COPD will cover for possible bacterial bronchitis. Will treat with pantoprazole 40 mg daily and GERD acidic food trigger avoidance.  Lung exam clear, no indication for chest x-ray. Recent cardiac exam within the last month within normal range.  Patient describes nonexertional chest pain. No evidence of fluid overload.  Return and ER precautions provided.   Burping  Productive cough  Pulmonary emphysema, unspecified emphysema type (HCC)  Vitamin D deficiency  Other orders -     Pantoprazole Sodium; Take 1 tablet (40 mg total) by mouth daily.  Dispense: 30 tablet; Refill: 0 -     Azithromycin; 2 tab po x 1 day then 1 tab po daily  Dispense: 6 tablet; Refill: 0    No follow-ups  on file.   Kerby Nora, MD

## 2023-08-02 NOTE — Patient Instructions (Signed)
 Start pantoprazole  40 mg for  acid reflux daily. Plan if feeling better to wean off.  Avoid acidic triggers.

## 2023-08-02 NOTE — Assessment & Plan Note (Signed)
 Acute, description appears to be noncardiac.  Symptoms most consistent with gastroesophageal reflux disease.  With congestion, mucus color change in the setting of COPD will cover for possible bacterial bronchitis. Will treat with pantoprazole 40 mg daily and GERD acidic food trigger avoidance.  Lung exam clear, no indication for chest x-ray. Recent cardiac exam within the last month within normal range.  Patient describes nonexertional chest pain. No evidence of fluid overload.  Return and ER precautions provided.

## 2023-08-11 ENCOUNTER — Other Ambulatory Visit: Payer: Self-pay | Admitting: Family Medicine

## 2023-08-19 ENCOUNTER — Other Ambulatory Visit: Payer: Self-pay | Admitting: Cardiovascular Disease

## 2023-08-19 DIAGNOSIS — E1169 Type 2 diabetes mellitus with other specified complication: Secondary | ICD-10-CM

## 2023-08-20 ENCOUNTER — Ambulatory Visit (INDEPENDENT_AMBULATORY_CARE_PROVIDER_SITE_OTHER)
Admission: RE | Admit: 2023-08-20 | Discharge: 2023-08-20 | Disposition: A | Discharge status: Home / Self Care | Source: Ambulatory Visit | Attending: Family | Admitting: Family

## 2023-08-20 ENCOUNTER — Encounter: Payer: Self-pay | Admitting: Family

## 2023-08-20 ENCOUNTER — Ambulatory Visit (INDEPENDENT_AMBULATORY_CARE_PROVIDER_SITE_OTHER): Admitting: Family

## 2023-08-20 ENCOUNTER — Ambulatory Visit: Payer: Self-pay

## 2023-08-20 VITALS — BP 134/84 | HR 94 | Temp 98.0°F | Ht 67.0 in | Wt 285.0 lb

## 2023-08-20 DIAGNOSIS — E1142 Type 2 diabetes mellitus with diabetic polyneuropathy: Secondary | ICD-10-CM

## 2023-08-20 DIAGNOSIS — R5383 Other fatigue: Secondary | ICD-10-CM

## 2023-08-20 DIAGNOSIS — I771 Stricture of artery: Secondary | ICD-10-CM | POA: Diagnosis not present

## 2023-08-20 DIAGNOSIS — Z7985 Long-term (current) use of injectable non-insulin antidiabetic drugs: Secondary | ICD-10-CM

## 2023-08-20 DIAGNOSIS — Z7984 Long term (current) use of oral hypoglycemic drugs: Secondary | ICD-10-CM | POA: Diagnosis not present

## 2023-08-20 DIAGNOSIS — Z7901 Long term (current) use of anticoagulants: Secondary | ICD-10-CM | POA: Diagnosis not present

## 2023-08-20 DIAGNOSIS — R053 Chronic cough: Secondary | ICD-10-CM

## 2023-08-20 DIAGNOSIS — J0110 Acute frontal sinusitis, unspecified: Secondary | ICD-10-CM | POA: Insufficient documentation

## 2023-08-20 DIAGNOSIS — R519 Headache, unspecified: Secondary | ICD-10-CM

## 2023-08-20 DIAGNOSIS — H539 Unspecified visual disturbance: Secondary | ICD-10-CM | POA: Diagnosis not present

## 2023-08-20 DIAGNOSIS — J9811 Atelectasis: Secondary | ICD-10-CM | POA: Diagnosis not present

## 2023-08-20 DIAGNOSIS — R4189 Other symptoms and signs involving cognitive functions and awareness: Secondary | ICD-10-CM

## 2023-08-20 LAB — POCT GLYCOSYLATED HEMOGLOBIN (HGB A1C): Hemoglobin A1C: 6.4 % — AB (ref 4.0–5.6)

## 2023-08-20 MED ORDER — DOXYCYCLINE HYCLATE 100 MG PO TABS: 100.0000 mg | ORAL_TABLET | Freq: Two times a day (BID) | ORAL | 0 refills | Status: AC

## 2023-08-20 NOTE — Assessment & Plan Note (Signed)
 RX doxycycline 100 mg po bid x 10 days Pt to continue tylenol prn sinus pain. Continue with humidifier prn and steam showers recommended as well. instructed If no symptom improvement in 48 hours please f/u  Start otc flonase Did advise again use of NSAIDS due to daily aspirin use

## 2023-08-20 NOTE — Progress Notes (Unsigned)
 Established Patient Office Visit  Subjective:   Patient ID: Jonathon Graham, male    DOB: 03/07/1956  Age: 68 y.o. MRN: [id number]  CC:  Chief Complaint  Patient presents with  . Headache    X3 days    HPI: Jonathon Graham is a 68 y.o. male presenting on 08/20/2023 for Headache (X3 days)  C/o headache for the last three days.  One month ago started with flashes of light out of corner of right eye that happens a few times a week for the last one month. The headaches are typically in the upper scalp frontal aspect, taking otc ibuprofen 800 mg which will help but will come back about 12 hours later and the headache is persistent, aching, slight light sensitivity. No nausea. No vomiting. No tingling in extremities. Ongoing 'constant cough and phlegm' clear and then at other times green/yellow sputum. He feels some brain fog as well, slower to comprehend since this came on.   Was seen march 6th by his pcp Dr. Ermalene Searing for productive cough and GERD symptoms, given zpack and pantoprazole.   He does see an eye doctor, was last seen about 6 months ago.  He does still report some chest congestion with ongoing cough.   Denies neck pain or muscle spasm  DM2: on metformin 100 mg twice daily , ozempic 2 mg weekly    ROS: Negative unless specifically indicated above in HPI.   Relevant past medical history reviewed and updated as indicated.   Allergies and medications reviewed and updated.   Current Outpatient Medications:  .  albuterol (PROAIR HFA) 108 (90 Base) MCG/ACT inhaler, Inhale 1-2 puffs into the lungs every 4 (four) hours as needed for wheezing or shortness of breath., Disp: 18 each, Rfl: 12 .  albuterol (PROVENTIL) (2.5 MG/3ML) 0.083% nebulizer solution, Take 3 mLs (2.5 mg total) by nebulization every 6 (six) hours as needed for wheezing or shortness of breath., Disp: 360 mL, Rfl: 6 .  aspirin 81 MG tablet, Take 243 mg by mouth daily. Takes 4 81mg  aspirin daily., Disp: , Rfl:  .   atorvastatin (LIPITOR) 40 MG tablet, Take 1 tablet by mouth once daily, Disp: 30 tablet, Rfl: 0 .  betamethasone valerate ointment (VALISONE) 0.1 %, Apply 1 application topically 2 (two) times daily. For up to 10 days., Disp: 30 g, Rfl: 0 .  blood glucose meter kit and supplies KIT, Dispense based on patient and insurance preference. Use up to four times daily as directed. (FOR ICD-9 250.00, 250.01)., Disp: 1 each, Rfl: 0 .  Cholecalciferol (VITAMIN D3) 1.25 MG (50000 UT) CAPS, TAKE 1 CAPSULE BY MOUTH  ONCE A WEEK, Disp: 12 capsule, Rfl: 0 .  clotrimazole (LOTRIMIN) 1 % cream, Apply 1 application 2 (two) times daily topically. For 7 days, Disp: 28 g, Rfl: 0 .  Continuous Blood Gluc Sensor (FREESTYLE LIBRE 3 SENSOR) MISC, 1 each by Does not apply route every 14 (fourteen) days. Place 1 sensor on the skin every 14 days. Use to check glucose continuously, Disp: 2 each, Rfl: 5 .  cyclobenzaprine (FLEXERIL) 10 MG tablet, Take 1 tablet (10 mg total) by mouth at bedtime as needed for muscle spasms., Disp: 15 tablet, Rfl: 0 .  doxycycline (VIBRA-TABS) 100 MG tablet, Take 1 tablet (100 mg total) by mouth 2 (two) times daily for 10 days., Disp: 20 tablet, Rfl: 0 .  DULoxetine (CYMBALTA) 60 MG capsule, Take 1 capsule (60 mg total) by mouth daily., Disp: 90 capsule, Rfl: 1 .  fluticasone-salmeterol (ADVAIR) 250-50 MCG/ACT AEPB, INHALE 1 DOSE BY MOUTH EVERY 12 HOURS, Disp: 60 each, Rfl: 0 .  furosemide (LASIX) 40 MG tablet, TAKE 1 TABLET BY MOUTH ONCE DAILY AS NEEDED, Disp: 90 tablet, Rfl: 1 .  gabapentin (NEURONTIN) 300 MG capsule, TAKE 1 CAPSULE BY MOUTH THREE TIMES DAILY, Disp: 270 capsule, Rfl: 0 .  ibuprofen (ADVIL) 800 MG tablet, Take 1 tablet (800 mg total) by mouth every 8 (eight) hours as needed., Disp: 60 tablet, Rfl: 0 .  metFORMIN (GLUCOPHAGE) 1000 MG tablet, TAKE 1 TABLET BY MOUTH TWICE DAILY WITH MEALS, Disp: 180 tablet, Rfl: 1 .  metoprolol tartrate (LOPRESSOR) 25 MG tablet, Take 1 tablet by mouth  twice daily, Disp: 180 tablet, Rfl: 2 .  ondansetron (ZOFRAN) 8 MG tablet, Take 1 tablet (8 mg total) by mouth every 8 (eight) hours as needed for nausea or vomiting., Disp: 12 tablet, Rfl: 0 .  oxyCODONE-acetaminophen (PERCOCET/ROXICET) 5-325 MG tablet, Take 0.5 tablets by mouth 2 (two) times daily as needed for severe pain (pain score 7-10)., Disp: 30 tablet, Rfl: 0 .  OZEMPIC, 2 MG/DOSE, 8 MG/3ML SOPN, INJECT 2 MG AS DIRECTED ONCE A WEEK, Disp: 9 mL, Rfl: 0 .  pantoprazole (PROTONIX) 40 MG tablet, Take 1 tablet (40 mg total) by mouth daily., Disp: 30 tablet, Rfl: 0 .  potassium chloride SA (KLOR-CON) 20 MEQ tablet, Take 1 tablet (20 mEq total) by mouth daily. (Patient taking differently: Take 20 mEq by mouth once a week.), Disp: 90 tablet, Rfl: 0 .  tamsulosin (FLOMAX) 0.4 MG CAPS capsule, Take 1 capsule by mouth once daily, Disp: 90 capsule, Rfl: 3  Allergies  Allergen Reactions  . Penicillins Anaphylaxis and Swelling    Objective:   BP 134/84 (BP Location: Right Arm, Patient Position: Sitting, Cuff Size: Large)   Pulse 94   Temp 98 F (36.7 C) (Temporal)   Ht 5\' 7"  (1.702 m)   Wt 285 lb (129.3 kg)   SpO2 94%   BMI 44.64 kg/m    Physical Exam Vitals reviewed.  Constitutional:      General: He is not in acute distress.    Appearance: Normal appearance. He is obese. He is not ill-appearing, toxic-appearing or diaphoretic.  HENT:     Head: Normocephalic.     Right Ear: Tympanic membrane normal.     Left Ear: Tympanic membrane normal.     Nose: Nose normal.     Mouth/Throat:     Mouth: Mucous membranes are moist.  Eyes:     Pupils: Pupils are equal, round, and reactive to light.  Cardiovascular:     Rate and Rhythm: Normal rate and regular rhythm.  Pulmonary:     Effort: Pulmonary effort is normal.     Breath sounds: No decreased air movement. Examination of the left-upper field reveals wheezing. Wheezing present. No decreased breath sounds.  Musculoskeletal:         General: Normal range of motion.     Cervical back: Normal range of motion.  Neurological:     General: No focal deficit present.     Mental Status: He is alert and oriented to person, place, and time. Mental status is at baseline.  Psychiatric:        Mood and Affect: Mood normal.        Behavior: Behavior normal.        Thought Content: Thought content normal.        Judgment: Judgment normal.    Assessment &  Plan:  Change in vision  Anticoagulated Assessment & Plan: Advised pt to try to avoid NSAIDS with aspirin  Tylenol ok    Chronic cough Assessment & Plan: Cxr today, slight wheeze luq abd pain  R/o pneumonia  Orders: -     DG Chest 2 View; Future  Type 2 diabetes, controlled, with peripheral neuropathy (HCC) Assessment & Plan: A1c poc in office today pending results   Orders: -     POCT glycosylated hemoglobin (Hb A1C)  Brain fog  Other fatigue  Acute nonintractable headache, unspecified headache type  Acute non-recurrent frontal sinusitis Assessment & Plan: RX doxycycline 100 mg po bid x 10 days Pt to continue tylenol prn sinus pain. Continue with humidifier prn and steam showers recommended as well. instructed If no symptom improvement in 48 hours please f/u  Start otc flonase Did advise again use of NSAIDS due to daily aspirin use   Orders: -     Doxycycline Hyclate; Take 1 tablet (100 mg total) by mouth 2 (two) times daily for 10 days.  Dispense: 20 tablet; Refill: 0    Suspect that sinusitis is causing the headache, however if no improvement with treatment plan could consider crp tsh, b12  Follow up plan: Return for f/u PCP if no improvement in symptoms.  Mort Sawyers, FNP

## 2023-08-20 NOTE — Telephone Encounter (Signed)
 Saw patient already, thank you for speaking with the patient.  Please see note for further information.

## 2023-08-20 NOTE — Assessment & Plan Note (Signed)
 Cxr today, slight wheeze luq abd pain  R/o pneumonia

## 2023-08-20 NOTE — Telephone Encounter (Signed)
 Copied from CRM [id number]. Topic: Clinical - Red Word Triage >> Aug 20, 2023 10:13 AM Florestine Avers wrote: Red Word that prompted transfer to Nurse Triage: Patient called in stating that he has been having chronic headaches for about 3 days now.   Chief Complaint: Headaches Symptoms: headache Frequency: 3 days Pertinent Negatives: Patient denies fever, stiff neck, eye pain, sore throat, nausea/ vomiting, dizziness, light headedness, chest pain, any recent head injuries/falls/car accidents. Disposition: [] ED /[] Urgent Care (no appt availability in office) / [x] Appointment(In office/virtual)/ []  [facility]/ [] Home Care/ [] Refused Recommended Disposition /[] [facility]/ []  Follow-up with PCP Additional Notes: Patient called and advised that he has been having headaches for the past 3 days. He states that he takes medication for his headache, the headache will ease off some, but will then come back. Patient denies fever, stiff neck, eye pain, sore throat, nausea/ vomiting, dizziness, light headedness, chest pain, any recent head injuries/falls/car accidents. Patient denies loss of vision or double vision but states he has been having intermittent flashes of light out of the corner of his right eye that happens 2-3 times a week maybe that started happening about a month ago. He said it went to his eye doctor prior to that and did not have any issues. He also states that his blood pressure has been good lately. Patient has tried medication and that helps some but then the headaches come back. Patient states that he was seen recently for "phlegm" in his chest and he finished his antibiotic for that and he states that the chest pain he had at that time is gone now. Patient given Care Advice as per protocol and appointment is made for today 08/20/2023 at 12:40 pm with Mort Sawyers at his PCP office. Patient is also advised that if anything gets worse to go to the Emergency Room.  Patient  verbalized understanding.  Reason for Disposition  [1] MODERATE headache (e.g., interferes with normal activities) AND [2] present > 24 hours AND [3] unexplained  (Exceptions: analgesics not tried, typical migraine, or headache part of viral illness)  Answer Assessment - Initial Assessment Questions 1. LOCATION: "Where does it hurt?"      Forehead mostly 2. ONSET: "When did the headache start?" (Minutes, hours or days)      3 days ago 3. PATTERN: "Does the pain come and go, or has it been constant since it started?"     "Constant for a while and then it eases up and then comes back" 4. SEVERITY: "How bad is the pain?" and "What does it keep you from doing?"  (e.g., Scale 1-10; mild, moderate, or severe)   - MILD (1-3): doesn't interfere with normal activities    - MODERATE (4-7): interferes with normal activities or awakens from sleep    - SEVERE (8-10): excruciating pain, unable to do any normal activities        7 5. RECURRENT SYMPTOM: "Have you ever had headaches before?" If Yes, ask: "When was the last time?" and "What happened that time?"      "Maybe one time years ago" 6. CAUSE: "What do you think is causing the headache?"     unsure 7. MIGRAINE: "Have you been diagnosed with migraine headaches?" If Yes, ask: "Is this headache similar?"      No 8. HEAD INJURY: "Has there been any recent injury to the head?"      No 9. OTHER SYMPTOMS: "Do you have any other symptoms?" (fever, stiff neck, eye  pain, sore throat, cold symptoms)     Pt seen in office recently for phlegm he states  Protocols used: Headache-A-AH

## 2023-08-20 NOTE — Patient Instructions (Signed)
  Start some over the counter flonase nose spray   Take antiboitic as directed, doxycycline.

## 2023-08-20 NOTE — Assessment & Plan Note (Signed)
 A1c poc in office today pending results

## 2023-08-20 NOTE — Assessment & Plan Note (Signed)
 Advised pt to try to avoid NSAIDS with aspirin  Tylenol ok

## 2023-08-21 ENCOUNTER — Encounter: Payer: Self-pay | Admitting: Family

## 2023-08-22 ENCOUNTER — Other Ambulatory Visit: Payer: Self-pay | Admitting: Family Medicine

## 2023-08-22 DIAGNOSIS — E1142 Type 2 diabetes mellitus with diabetic polyneuropathy: Secondary | ICD-10-CM

## 2023-08-23 NOTE — Telephone Encounter (Signed)
 Last office visit 08/20/2023 with D[facility] for vision change/headache.  Last refilled 02/23/2023 for #[id number] with no refills.  Next Appt: No future appointments with PCP.

## 2023-08-24 ENCOUNTER — Ambulatory Visit (HOSPITAL_BASED_OUTPATIENT_CLINIC_OR_DEPARTMENT_OTHER): Admitting: Student

## 2023-08-26 DIAGNOSIS — J449 Chronic obstructive pulmonary disease, unspecified: Secondary | ICD-10-CM | POA: Diagnosis not present

## 2023-08-28 DIAGNOSIS — G4733 Obstructive sleep apnea (adult) (pediatric): Secondary | ICD-10-CM | POA: Diagnosis not present

## 2023-09-19 ENCOUNTER — Other Ambulatory Visit: Payer: Self-pay | Admitting: Family Medicine

## 2023-09-19 DIAGNOSIS — I10 Essential (primary) hypertension: Secondary | ICD-10-CM

## 2023-09-25 DIAGNOSIS — G4733 Obstructive sleep apnea (adult) (pediatric): Secondary | ICD-10-CM | POA: Diagnosis not present

## 2023-09-26 ENCOUNTER — Ambulatory Visit: Payer: 59

## 2023-09-26 VITALS — Ht 68.0 in | Wt 282.0 lb

## 2023-09-26 DIAGNOSIS — Z1211 Encounter for screening for malignant neoplasm of colon: Secondary | ICD-10-CM | POA: Diagnosis not present

## 2023-09-26 DIAGNOSIS — J449 Chronic obstructive pulmonary disease, unspecified: Secondary | ICD-10-CM | POA: Diagnosis not present

## 2023-09-26 DIAGNOSIS — Z0000 Encounter for general adult medical examination without abnormal findings: Secondary | ICD-10-CM

## 2023-09-26 NOTE — Patient Instructions (Addendum)
 Mr. Fitzer , Thank you for taking time to come for your Medicare Wellness Visit. I appreciate your ongoing commitment to your health goals. Please review the following plan we discussed and let me know if I can assist you in the future.   Referrals/Orders/Follow-Ups/Clinician Recommendations:  An order has been placed for a Cologuard for you. They will mail you the kit with instructions on how to obtain the sample and send it back in to be tested. If you do not received your kit, please call our office and let us  know.    This is a list of the screening recommended for you and due dates:  Health Maintenance  Topic Date Due   DTaP/Tdap/Td vaccine (1 - Tdap) Never done   Zoster (Shingles) Vaccine (1 of 2) Never done   Pneumonia Vaccine (2 of 2 - PCV) 03/17/2018   COVID-19 Vaccine (1 - 2024-25 season) Never done   Eye exam for diabetics  02/02/2023   Complete foot exam   10/04/2023   Cologuard (Stool DNA test)  11/13/2023   Flu Shot  12/28/2023   Yearly kidney function blood test for diabetes  01/09/2024   Yearly kidney health urinalysis for diabetes  01/09/2024   Hemoglobin A1C  02/20/2024   Medicare Annual Wellness Visit  09/25/2024   Hepatitis C Screening  Completed   HIV Screening  Completed   HPV Vaccine  Aged Out   Meningitis B Vaccine  Aged Out    Advanced directives: (Copy Requested) Please bring a copy of your health care power of attorney and living will to the office to be added to your chart at your convenience. You can mail to [facility] [address] [place], [place] [address] or email to [email or web address]  Next Medicare Annual Wellness Visit scheduled for next year: Yes 09/26/24 @ 1pm televisit

## 2023-09-26 NOTE — Progress Notes (Signed)
 Subjective:   Jonathon Graham is a 68 y.o. who presents for a Medicare Wellness preventive visit.  Visit Complete: Virtual I connected with  Jonathon Graham on 09/26/23 by a audio enabled telemedicine application and verified that I am speaking with the correct person using two identifiers.  Patient Location: Home  Provider Location: Home Office  I discussed the limitations of evaluation and management by telemedicine. The patient expressed understanding and agreed to proceed.  Vital Signs: Because this visit was a virtual/telehealth visit, some criteria may be missing or patient reported. Any vitals not documented were not able to be obtained and vitals that have been documented are patient reported.  VideoDeclined- This patient declined Librarian, academic. Therefore the visit was completed with audio only.  Persons Participating in Visit: Patient.  AWV Questionnaire: No: Patient Medicare AWV questionnaire was not completed prior to this visit.  Cardiac Risk Factors include: advanced age (>36men, >33 women);diabetes mellitus;dyslipidemia;hypertension;male gender;obesity (BMI >30kg/m2);sedentary lifestyle     Objective:    Today's Vitals   09/26/23 1303  Weight: 282 lb (127.9 kg)  Height: 5\' 8"  (1.727 m)  PainSc: 8    Body mass index is 42.88 kg/m.     09/26/2023    1:21 PM 09/25/2022    1:15 PM 09/22/2021   12:51 PM 09/21/2020   12:02 PM 05/26/2020   11:12 AM 03/24/2017    7:39 PM 03/24/2017    2:56 PM  Advanced Directives  Does Patient Have a Medical Advance Directive? Yes Yes Yes Yes No No No  Type of Estate agent of [place];Living will Healthcare Power of [place];Living will Healthcare Power of [place];Living will Healthcare Power of [place];Living will     Does patient want to make changes to medical advance directive?   No - Patient declined      Copy of Healthcare Power of Attorney in Chart? No - copy requested No  - copy requested No - copy requested No - copy requested     Would patient like information on creating a medical advance directive?   No - Patient declined No - Patient declined Yes (MAU/Ambulatory/Procedural Areas - Information given) No - Patient declined     Current Medications (verified) Outpatient Encounter Medications as of 09/26/2023  Medication Sig   albuterol  (PROAIR  HFA) 108 (90 Base) MCG/ACT inhaler Inhale 1-2 puffs into the lungs every 4 (four) hours as needed for wheezing or shortness of breath.   albuterol  (PROVENTIL ) (2.5 MG/3ML) 0.083% nebulizer solution Take 3 mLs (2.5 mg total) by nebulization every 6 (six) hours as needed for wheezing or shortness of breath.   aspirin  81 MG tablet Take 243 mg by mouth daily. Takes 4 81mg  aspirin  daily.   atorvastatin  (LIPITOR) 40 MG tablet Take 1 tablet by mouth once daily   betamethasone  valerate ointment (VALISONE ) 0.1 % Apply 1 application topically 2 (two) times daily. For up to 10 days.   blood glucose meter kit and supplies KIT Dispense based on patient and insurance preference. Use up to four times daily as directed. (FOR ICD-9 250.00, 250.01).   Cholecalciferol (VITAMIN D3) 1.25 MG (50000 UT) CAPS TAKE 1 CAPSULE BY MOUTH  ONCE A WEEK   clotrimazole  (LOTRIMIN ) 1 % cream Apply 1 application 2 (two) times daily topically. For 7 days   Continuous Blood Gluc Sensor (FREESTYLE LIBRE 3 SENSOR) MISC 1 each by Does not apply route every 14 (fourteen) days. Place 1 sensor on the skin every 14 days. Use to check  glucose continuously   cyclobenzaprine  (FLEXERIL ) 10 MG tablet Take 1 tablet (10 mg total) by mouth at bedtime as needed for muscle spasms.   DULoxetine  (CYMBALTA ) 60 MG capsule Take 1 capsule (60 mg total) by mouth daily.   fluticasone -salmeterol (ADVAIR) 250-50 MCG/ACT AEPB INHALE 1 DOSE BY MOUTH EVERY 12 HOURS   furosemide  (LASIX ) 40 MG tablet TAKE 1 TABLET BY MOUTH ONCE DAILY AS NEEDED   gabapentin  (NEURONTIN ) 300 MG capsule TAKE 1  CAPSULE BY MOUTH THREE TIMES DAILY   ibuprofen  (ADVIL ) 800 MG tablet Take 1 tablet (800 mg total) by mouth every 8 (eight) hours as needed.   metFORMIN  (GLUCOPHAGE ) 1000 MG tablet TAKE 1 TABLET BY MOUTH TWICE DAILY WITH MEALS   metoprolol  tartrate (LOPRESSOR ) 25 MG tablet Take 1 tablet by mouth twice daily   ondansetron  (ZOFRAN ) 8 MG tablet Take 1 tablet (8 mg total) by mouth every 8 (eight) hours as needed for nausea or vomiting.   oxyCODONE -acetaminophen  (PERCOCET/ROXICET) 5-325 MG tablet Take 0.5 tablets by mouth 2 (two) times daily as needed for severe pain (pain score 7-10).   OZEMPIC , 2 MG/DOSE, 8 MG/3ML SOPN INJECT 2 MG AS DIRECTED ONCE A WEEK   pantoprazole  (PROTONIX ) 40 MG tablet Take 1 tablet (40 mg total) by mouth daily.   potassium chloride  SA (KLOR-CON ) 20 MEQ tablet Take 1 tablet (20 mEq total) by mouth daily. (Patient taking differently: Take 20 mEq by mouth once a week.)   tamsulosin  (FLOMAX ) 0.4 MG CAPS capsule Take 1 capsule by mouth once daily   No facility-administered encounter medications on file as of 09/26/2023.    Allergies (verified) Penicillins   History: Past Medical History:  Diagnosis Date   Arthritis    Benign prostatic hyperplasia (BPH) with urinary urgency    nodular   COPD (chronic obstructive pulmonary disease) (HCC)    Depression    Diabetes mellitus without complication (HCC)    Essential hypertension    Fibromyalgia, primary    Hyperlipidemia    Hypertension    Obesity    No past surgical history on file. Family History  Problem Relation Age of Onset   Heart disease Father    Diabetes Mellitus II Sister    Myasthenia gravis Sister    Fibrocystic breast disease Sister    Hypertension Sister    Cancer Sister 1        sinus cancer   Diabetes Mellitus II Brother    Heart disease Mother    Social History   Socioeconomic History   Marital status: Married    Spouse name: Not on file   Number of children: Not on file   Years of  education: Not on file   Highest education level: Not on file  Occupational History   Occupation: retired  Tobacco Use   Smoking status: Former    Current packs/day: 0.00    Types: Cigarettes    Quit date: 03/28/2017    Years since quitting: 6.5   Smokeless tobacco: Never  Substance and Sexual Activity   Alcohol use: No    Alcohol/week: 0.0 standard drinks of alcohol   Drug use: No   Sexual activity: Yes  Other Topics Concern   Not on file  Social History Narrative   Not on file   Social Drivers of Health   Financial Resource Strain: Low Risk  (09/26/2023)   Overall Financial Resource Strain (CARDIA)    Difficulty of Paying Living Expenses: Not hard at all  Food Insecurity: No Food  Insecurity (09/26/2023)   Hunger Vital Sign    Worried About Running Out of Food in the Last Year: Never true    Ran Out of Food in the Last Year: Never true  Transportation Needs: No Transportation Needs (09/26/2023)   PRAPARE - Administrator, Civil Service (Medical): No    Lack of Transportation (Non-Medical): No  Physical Activity: Inactive (09/26/2023)   Exercise Vital Sign    Days of Exercise per Week: 0 days    Minutes of Exercise per Session: 0 min  Stress: No Stress Concern Present (09/26/2023)   [employer] of Occupational Health - Occupational Stress Questionnaire    Feeling of Stress : Only a little  Social Connections: Socially Isolated (09/26/2023)   Social Connection and Isolation Panel [NHANES]    Frequency of Communication with Friends and Family: Once a week    Frequency of Social Gatherings with Friends and Family: Once a week    Attends Religious Services: Never    Database administrator or Organizations: No    Attends Engineer, structural: Never    Marital Status: Married    Tobacco Counseling Counseling given: Not Answered    Clinical Intake:  Pre-visit preparation completed: Yes  Pain : 0-10 Pain Score: 8  Pain Type: Chronic pain Pain  Location: Back Pain Orientation: Lower Pain Descriptors / Indicators: Aching Pain Onset: More than a month ago Pain Frequency: Intermittent Pain Relieving Factors: medication  Pain Relieving Factors: medication  BMI - recorded: 42.88 Nutritional Status: BMI > 30  Obese Nutritional Risks: None Diabetes: Yes CBG done?: Yes (BS 168 this am at home) CBG resulted in Enter/ Edit results?: No Did pt. bring in CBG monitor from home?: No  Lab Results  Component Value Date   HGBA1C 6.4 (A) 08/20/2023   HGBA1C 6.7 (H) 01/09/2023   HGBA1C 6.6 (A) 07/04/2022     How often do you need to have someone help you when you read instructions, pamphlets, or other written materials from your doctor or pharmacy?: 1 - Never  Interpreter Needed?: No  Comments: lives with wife Information entered by :: B.Johanan Skorupski,LPN   Activities of Daily Living     09/26/2023    1:22 PM  In your present state of health, do you have any difficulty performing the following activities:  Hearing? 0  Vision? 0  Difficulty concentrating or making decisions? 0  Walking or climbing stairs? 0  Dressing or bathing? 0  Doing errands, shopping? 0  Preparing Food and eating ? N  Using the Toilet? N  In the past six months, have you accidently leaked urine? Y  Do you have problems with loss of bowel control? N  Managing your Medications? N  Managing your Finances? N  Housekeeping or managing your Housekeeping? N    Patient Care Team: Judithann Novas, MD as PCP - General (Family Medicine)  Indicate any recent Medical Services you may have received from other than [facility] providers in the past year (date may be approximate).     Assessment:   This is a routine wellness examination for [place].  Hearing/Vision screen Hearing Screening - Comments:: Pt says his hearing is good Vision Screening - Comments:: Pt says his visionis good;readers only [facility]   Goals Addressed               This Visit's Progress      Patient Stated   On track     09/26/23- I will maintain  and continue medications as prescribed.      Weight (lb) < 200 lb (90.7 kg) (pt-stated)   282 lb (127.9 kg)     09/26/23-I want to lose 40lbs.with diet changes.       Depression Screen     09/26/2023    1:11 PM 08/02/2023   11:33 AM 10/04/2022    9:27 AM 09/25/2022    1:12 PM 07/04/2022    9:08 AM 09/22/2021   12:45 PM 09/21/2020   12:04 PM  PHQ 2/9 Scores  PHQ - 2 Score 2 4 3  0 2 0 4  PHQ- 9 Score 6 10 12  5  4     Fall Risk     09/26/2023    1:07 PM 08/02/2023   11:32 AM 10/04/2022    9:28 AM 09/25/2022    1:04 PM 07/04/2022    9:08 AM  Fall Risk   Falls in the past year? 0 0 0 0 0  Number falls in past yr: 0 0 0 0 0  Injury with Fall? 0 0 0 0 0  Risk for fall due to : No Fall Risks No Fall Risks No Fall Risks No Fall Risks No Fall Risks  Follow up Education provided;Falls prevention discussed Falls evaluation completed Falls evaluation completed Falls prevention discussed;Falls evaluation completed Falls evaluation completed    MEDICARE RISK AT HOME:  Medicare Risk at Home Any stairs in or around the home?: Yes If so, are there any without handrails?: Yes Home free of loose throw rugs in walkways, pet beds, electrical cords, etc?: Yes Adequate lighting in your home to reduce risk of falls?: Yes Life alert?: No Use of a cane, walker or w/c?: Yes (cane) Grab bars in the bathroom?: Yes Shower chair or bench in shower?: Yes Elevated toilet seat or a handicapped toilet?: Yes  TIMED UP AND GO:  Was the test performed?  No  Cognitive Function: 6CIT completed    09/21/2020   12:06 PM  MMSE - Mini Mental State Exam  Not completed: Refused        09/26/2023    1:23 PM 09/25/2022    1:17 PM 09/22/2021   12:52 PM  6CIT Screen  What Year? 0 points 0 points 0 points  What month? 0 points 0 points 0 points  What time? 0 points 0 points 0 points  Count back from 20 0 points 0 points 0 points  Months in reverse 0 points 0  points 0 points  Repeat phrase 0 points 0 points 0 points  Total Score 0 points 0 points 0 points    Immunizations Immunization History  Administered Date(s) Administered   Influenza,inj,Quad PF,6+ Mos 03/26/2017, 03/27/2018, 03/07/2019   Pneumococcal Polysaccharide-23 03/17/2017    Screening Tests Health Maintenance  Topic Date Due   DTaP/Tdap/Td (1 - Tdap) Never done   Zoster Vaccines- Shingrix (1 of 2) Never done   Pneumonia Vaccine 38+ Years old (2 of 2 - PCV) 03/17/2018   COVID-19 Vaccine (1 - 2024-25 season) Never done   OPHTHALMOLOGY EXAM  02/02/2023   FOOT EXAM  10/04/2023   Fecal DNA (Cologuard)  11/13/2023   INFLUENZA VACCINE  12/28/2023   Diabetic kidney evaluation - eGFR measurement  01/09/2024   Diabetic kidney evaluation - Urine ACR  01/09/2024   HEMOGLOBIN A1C  02/20/2024   Medicare Annual Wellness (AWV)  09/25/2024   Hepatitis C Screening  Completed   HIV Screening  Completed   HPV VACCINES  Aged Out  Meningococcal B Vaccine  Aged Out    Health Maintenance  Health Maintenance Due  Topic Date Due   DTaP/Tdap/Td (1 - Tdap) Never done   Zoster Vaccines- Shingrix (1 of 2) Never done   Pneumonia Vaccine 67+ Years old (2 of 2 - PCV) 03/17/2018   COVID-19 Vaccine (1 - 2024-25 season) Never done   OPHTHALMOLOGY EXAM  02/02/2023   Health Maintenance Items Addressed: Cologuard Ordered  Additional Screening:  Vision Screening: Recommended annual ophthalmology exams for early detection of glaucoma and other disorders of the eye.  Dental Screening: Recommended annual dental exams for proper oral hygiene  Community Resource Referral / Chronic Care Management: CRR required this visit?  No   CCM required this visit?  Appt scheduled with PCP     Plan:     I have personally reviewed and noted the following in the patient's chart:   Medical and social history Use of alcohol, tobacco or illicit drugs  Current medications and supplements including opioid  prescriptions. Patient is currently taking opioid prescriptions. Information provided to patient regarding non-opioid alternatives. Patient advised to discuss non-opioid treatment plan with their provider. Functional ability and status Nutritional status Physical activity Advanced directives List of other physicians Hospitalizations, surgeries, and ER visits in previous 12 months Vitals Screenings to include cognitive, depression, and falls Referrals and appointments  In addition, I have reviewed and discussed with patient certain preventive protocols, quality metrics, and best practice recommendations. A written personalized care plan for preventive services as well as general preventive health recommendations were provided to patient.     Nerissa Bannister, LPN   12/21/3662   After Visit Summary: (MyChart) Due to this being a telephonic visit, the after visit summary with patients personalized plan was offered to patient via MyChart   Notes: Nothing significant to report at this time.

## 2023-09-27 ENCOUNTER — Telehealth: Payer: Self-pay | Admitting: *Deleted

## 2023-09-27 NOTE — Telephone Encounter (Signed)
 I spoke with pt;pt finished abx end of March 2025 and is feeling better from pneumonia but still has occasional prod cough with clear to yellow phlegm. No fever, CP or SOB. Offered pt multiple FU appt for pneumonia but first appt pt could schedule was 10/03/23 at 10:20 with Dr Cherlyn Cornet and UC & ED precautions given and pt voiced understanding., sending note to Dr Cherlyn Cornet and [place] pool.

## 2023-09-27 NOTE — Telephone Encounter (Signed)
 Copied from CRM [id number]. Topic: Clinical - Medical Advice >> Sep 27, 2023 10:48 AM Allyne Areola wrote: Reason for CRM: Patient is calling to speak with Abe Abed, per patient Abe Abed asked for patient to call back to follow up on his pneumonia, Patient states he feeling well but still experiencing.

## 2023-09-27 NOTE — Telephone Encounter (Signed)
 Please triage

## 2023-09-27 NOTE — Telephone Encounter (Signed)
 Noted.  Patient does need repeat evaluation in office.

## 2023-10-03 ENCOUNTER — Ambulatory Visit: Admitting: Family Medicine

## 2023-10-03 ENCOUNTER — Encounter: Payer: Self-pay | Admitting: Family Medicine

## 2023-10-03 VITALS — BP 136/84 | HR 33 | Temp 98.3°F | Ht 67.0 in | Wt 281.8 lb

## 2023-10-03 DIAGNOSIS — R001 Bradycardia, unspecified: Secondary | ICD-10-CM

## 2023-10-03 DIAGNOSIS — R053 Chronic cough: Secondary | ICD-10-CM | POA: Diagnosis not present

## 2023-10-03 DIAGNOSIS — I152 Hypertension secondary to endocrine disorders: Secondary | ICD-10-CM | POA: Diagnosis not present

## 2023-10-03 DIAGNOSIS — E1159 Type 2 diabetes mellitus with other circulatory complications: Secondary | ICD-10-CM

## 2023-10-03 NOTE — Assessment & Plan Note (Signed)
 Resolved. Clear lung exam in office today.

## 2023-10-03 NOTE — Assessment & Plan Note (Signed)
Stable chronic. ?

## 2023-10-03 NOTE — Assessment & Plan Note (Addendum)
 Acute, asymptomatic, has noted in past likely due to BBlocker but given extreme low on pulse ox... will check EKG. EKG: showed ventricular bigeminy, RBBB, left bifascicular block, nonspecific T wave changes,  cmpared to last check 05/2023 at Dr. Arlester Ladd office.  I have sent a note to Dr. Dean Every to ask him to review the EKG to determine if we need to make any changes with medication or treatment plan.  Return and ER precautions provided.

## 2023-10-03 NOTE — Progress Notes (Signed)
 Patient ID: Jonathon Graham, male    DOB: 12/08/1955, 68 y.o.   MRN: [id number]  This visit was conducted in person.  BP 136/84   Pulse (!) 33   Temp 98.3 F (36.8 C) (Oral)   Ht 5\' 7"  (1.702 m)   Wt 281 lb 12.8 oz (127.8 kg)   SpO2 94%   BMI 44.14 kg/m    CC:  Chief Complaint  Patient presents with   Follow-up    Pneumonia patient still has a cough     Subjective:   HPI: Jonathon Graham is a 68 y.o. male presenting on 10/03/2023 for Follow-up (Pneumonia patient still has a cough )  Patient was seen by Felicita Horns, NP on August 20, 2023 for headache and chronic cough ongoing since early March. Had already been treated with Z-Pak and pantoprazole . Chest x-ray was performed that just showed  left basilar atelectasis He was treated with doxycycline  100 mg p.o. twice daily x 10 days for  sinusitis/presumed atypical pneumonia. He has a history of COPD     Bradycardia on pulse ox today: on metoprolol ... he did take this medication 1 hour before coming to the office. He has a history of right and left bundle branch block, heart failure with reduced ejection fraction, cardiomegaly.  Today he reports he has noted improvement in cough, less mucus overall. Only occ cough dry cough.  No SOB, no wheeze.   Using albuterol  prn.  Advair 1-2 times daily    Occ anxiety attacks at night, wears CPAP.  Relevant past medical, surgical, family and social history reviewed and updated as indicated. Interim medical history since our last visit reviewed. Allergies and medications reviewed and updated. Outpatient Medications Prior to Visit  Medication Sig Dispense Refill   albuterol  (PROAIR  HFA) 108 (90 Base) MCG/ACT inhaler Inhale 1-2 puffs into the lungs every 4 (four) hours as needed for wheezing or shortness of breath. 18 each 12   albuterol  (PROVENTIL ) (2.5 MG/3ML) 0.083% nebulizer solution Take 3 mLs (2.5 mg total) by nebulization every 6 (six) hours as needed for wheezing or shortness of  breath. 360 mL 6   aspirin  81 MG tablet Take 243 mg by mouth daily. Takes 4 81mg  aspirin  daily.     atorvastatin  (LIPITOR) 40 MG tablet Take 1 tablet by mouth once daily 90 tablet 3   betamethasone  valerate ointment (VALISONE ) 0.1 % Apply 1 application topically 2 (two) times daily. For up to 10 days. 30 g 0   blood glucose meter kit and supplies KIT Dispense based on patient and insurance preference. Use up to four times daily as directed. (FOR ICD-9 250.00, 250.01). 1 each 0   Cholecalciferol (VITAMIN D3) 1.25 MG (50000 UT) CAPS TAKE 1 CAPSULE BY MOUTH  ONCE A WEEK 12 capsule 0   clotrimazole  (LOTRIMIN ) 1 % cream Apply 1 application 2 (two) times daily topically. For 7 days 28 g 0   Continuous Blood Gluc Sensor (FREESTYLE LIBRE 3 SENSOR) MISC 1 each by Does not apply route every 14 (fourteen) days. Place 1 sensor on the skin every 14 days. Use to check glucose continuously 2 each 5   cyclobenzaprine  (FLEXERIL ) 10 MG tablet Take 1 tablet (10 mg total) by mouth at bedtime as needed for muscle spasms. 15 tablet 0   DULoxetine  (CYMBALTA ) 60 MG capsule Take 1 capsule (60 mg total) by mouth daily. 90 capsule 1   fluticasone -salmeterol (ADVAIR) 250-50 MCG/ACT AEPB INHALE 1 DOSE BY MOUTH EVERY 12 HOURS 60 each  0   furosemide  (LASIX ) 40 MG tablet TAKE 1 TABLET BY MOUTH ONCE DAILY AS NEEDED 90 tablet 0   gabapentin  (NEURONTIN ) 300 MG capsule TAKE 1 CAPSULE BY MOUTH THREE TIMES DAILY 270 capsule 0   ibuprofen  (ADVIL ) 800 MG tablet Take 1 tablet (800 mg total) by mouth every 8 (eight) hours as needed. 60 tablet 0   metFORMIN  (GLUCOPHAGE ) 1000 MG tablet TAKE 1 TABLET BY MOUTH TWICE DAILY WITH MEALS 180 tablet 1   metoprolol  tartrate (LOPRESSOR ) 25 MG tablet Take 1 tablet by mouth twice daily 180 tablet 2   ondansetron  (ZOFRAN ) 8 MG tablet Take 1 tablet (8 mg total) by mouth every 8 (eight) hours as needed for nausea or vomiting. 12 tablet 0   oxyCODONE -acetaminophen  (PERCOCET/ROXICET) 5-325 MG tablet Take 0.5  tablets by mouth 2 (two) times daily as needed for severe pain (pain score 7-10). 30 tablet 0   OZEMPIC , 2 MG/DOSE, 8 MG/3ML SOPN INJECT 2 MG AS DIRECTED ONCE A WEEK 9 mL 0   pantoprazole  (PROTONIX ) 40 MG tablet Take 1 tablet (40 mg total) by mouth daily. 30 tablet 0   potassium chloride  SA (KLOR-CON ) 20 MEQ tablet Take 1 tablet (20 mEq total) by mouth daily. (Patient taking differently: Take 20 mEq by mouth once a week.) 90 tablet 0   tamsulosin  (FLOMAX ) 0.4 MG CAPS capsule Take 1 capsule by mouth once daily 90 capsule 3   No facility-administered medications prior to visit.     Per HPI unless specifically indicated in ROS section below Review of Systems  Constitutional:  Negative for fatigue and fever.  HENT:  Negative for ear pain.   Eyes:  Negative for pain.  Respiratory:  Positive for cough. Negative for shortness of breath.   Cardiovascular:  Negative for chest pain, palpitations and leg swelling.  Gastrointestinal:  Negative for abdominal pain.  Genitourinary:  Negative for dysuria.  Musculoskeletal:  Negative for arthralgias.  Neurological:  Negative for syncope, light-headedness and headaches.  Psychiatric/Behavioral:  Negative for dysphoric mood.    Objective:  BP 136/84   Pulse (!) 33   Temp 98.3 F (36.8 C) (Oral)   Ht 5\' 7"  (1.702 m)   Wt 281 lb 12.8 oz (127.8 kg)   SpO2 94%   BMI 44.14 kg/m   Wt Readings from Last 3 Encounters:  10/03/23 281 lb 12.8 oz (127.8 kg)  09/26/23 282 lb (127.9 kg)  08/20/23 285 lb (129.3 kg)      Physical Exam Constitutional:      Appearance: He is well-developed.  HENT:     Head: Normocephalic.     Right Ear: Hearing normal.     Left Ear: Hearing normal.     Nose: Nose normal.  Neck:     Thyroid : No thyroid  mass or thyromegaly.     Vascular: No carotid bruit.     Trachea: Trachea normal.  Cardiovascular:     Rate and Rhythm: Regular rhythm. Bradycardia present.     Pulses: Normal pulses.     Heart sounds: Heart sounds are  distant. No murmur heard.    No friction rub. No gallop.     Comments: No peripheral edema Pulmonary:     Effort: Pulmonary effort is normal. No respiratory distress.     Breath sounds: Normal breath sounds.  Skin:    General: Skin is warm and dry.     Findings: No rash.  Psychiatric:        Speech: Speech normal.  Behavior: Behavior normal.        Thought Content: Thought content normal.       Results for orders placed or performed in visit on 08/20/23  POCT glycosylated hemoglobin (Hb A1C)   Collection Time: 08/20/23  1:13 PM  Result Value Ref Range   Hemoglobin A1C 6.4 (A) 4.0 - 5.6 %   HbA1c POC (<> result, manual entry)     HbA1c, POC (prediabetic range)     HbA1c, POC (controlled diabetic range)      Assessment and Plan  Bradycardia Assessment & Plan: Acute, asymptomatic, has noted in past likely due to BBlocker but given extreme low on pulse ox... will check EKG. EKG: showed ventricular bigeminy, RBBB, left bifascicular block, nonspecific T wave changes,  cmpared to last check 05/2023 at Dr. Arlester Ladd office.  I have sent a note to Dr. Dean Every to ask him to review the EKG to determine if we need to make any changes with medication or treatment plan.  Return and ER precautions provided.   Orders: -     EKG 12-Lead  Chronic cough Assessment & Plan:  Resolved. Clear lung exam in office today.   Hypertension associated with diabetes (HCC) Assessment & Plan: Stable  chronic.           Return in about 5 months (around 03/04/2024) for phone AMW,  fasting labs then CPE with me.   Herby Lolling, MD

## 2023-10-03 NOTE — Assessment & Plan Note (Signed)
 Chronic, recent acute flare improved.  Continue albuterol  for rescue and  increase Advair  toBID ( only occ using BID)

## 2023-10-04 ENCOUNTER — Other Ambulatory Visit: Payer: Self-pay | Admitting: Family Medicine

## 2023-10-04 DIAGNOSIS — R001 Bradycardia, unspecified: Secondary | ICD-10-CM

## 2023-10-05 ENCOUNTER — Telehealth: Payer: Self-pay | Admitting: *Deleted

## 2023-10-05 ENCOUNTER — Ambulatory Visit: Attending: Family Medicine

## 2023-10-05 DIAGNOSIS — R001 Bradycardia, unspecified: Secondary | ICD-10-CM

## 2023-10-05 NOTE — Telephone Encounter (Signed)
 Mr. Slocumb notified as instructed by telephone.  He is agreeable with doing the Zio monitor and follow up with Dr. Pearlean Botts office.

## 2023-10-05 NOTE — Telephone Encounter (Signed)
 Ordered

## 2023-10-05 NOTE — Telephone Encounter (Signed)
-----   Message from Herby Lolling sent at 10/04/2023  5:12 PM EDT -----  Please let pt know.  That Dr. Dean Every recommended as below given his low heart rate for us  to set him up with a Zio monitor for 1 week and then for him to call to follow-up with one of the apps when next available. Please let me know if he is agreeable and I will place the order for the ZIO monitor. ----- Message ----- From: Avanell Leigh, MD Sent: 10/03/2023   5:22 PM EDT To: Judithann Novas, MD; Deforest Fast, RN  Amy, if he is asymptomatic I would get a 1 week Zio patch to assess PVC burden and then have him come back to see one of our APP's in the near future. ----- Message ----- From: Judithann Novas, MD Sent: 10/03/2023  10:48 AM EDT To: Avanell Leigh, MD   Dr. Katheryne Pane, I saw this mutual patient in office for follow up PNA. He is doing better, feeling well... his HR was noted to be 33 on  pulse ox. He is asymptomatic  ( no CP, no new SB, no dizziness) EKG showed ventricular bigeminy, RBBB, left block, nonspecific T wave changes. Would you make any med changes? Decrease metoprolol ? Or any other recommendations? Thanks for reviewing his EKG.

## 2023-10-07 DIAGNOSIS — G4733 Obstructive sleep apnea (adult) (pediatric): Secondary | ICD-10-CM | POA: Diagnosis not present

## 2023-10-16 ENCOUNTER — Ambulatory Visit: Admitting: Family Medicine

## 2023-10-26 DIAGNOSIS — J449 Chronic obstructive pulmonary disease, unspecified: Secondary | ICD-10-CM | POA: Diagnosis not present

## 2023-10-28 DIAGNOSIS — G4733 Obstructive sleep apnea (adult) (pediatric): Secondary | ICD-10-CM | POA: Diagnosis not present

## 2023-11-02 DIAGNOSIS — R001 Bradycardia, unspecified: Secondary | ICD-10-CM | POA: Diagnosis not present

## 2023-11-05 ENCOUNTER — Other Ambulatory Visit: Payer: Self-pay | Admitting: Family Medicine

## 2023-11-05 DIAGNOSIS — M48061 Spinal stenosis, lumbar region without neurogenic claudication: Secondary | ICD-10-CM

## 2023-11-05 DIAGNOSIS — M25552 Pain in left hip: Secondary | ICD-10-CM

## 2023-11-05 NOTE — Telephone Encounter (Unsigned)
 Copied from CRM [id number]. Topic: Clinical - Medication Refill >> Nov 05, 2023  1:19 PM Adonis Hoot wrote: Medication: oxyCODONE -acetaminophen  (PERCOCET/ROXICET) 5-325 MG tablet  Has the patient contacted their pharmacy? No (Agent: If no, request that the patient contact the pharmacy for the refill. If patient does not wish to contact the pharmacy document the reason why and proceed with request.) (Agent: If yes, when and what did the pharmacy advise?)  This is the patient's preferred pharmacy:  [facility] [address], [place] - [address] [place] [address] Phone: [phone] Fax: [phone]  Is this the correct pharmacy for this prescription? Yes If no, delete pharmacy and type the correct one.   Has the prescription been filled recently? No  Is the patient out of the medication? Yes  Has the patient been seen for an appointment in the last year OR does the patient have an upcoming appointment? Yes  Can we respond through MyChart? Yes  Agent: Please be advised that Rx refills may take up to 3 business days. We ask that you follow-up with your pharmacy.

## 2023-11-06 ENCOUNTER — Other Ambulatory Visit: Payer: Self-pay | Admitting: Family Medicine

## 2023-11-07 DIAGNOSIS — G4733 Obstructive sleep apnea (adult) (pediatric): Secondary | ICD-10-CM | POA: Diagnosis not present

## 2023-11-07 NOTE — Telephone Encounter (Signed)
 Please call and schedule pain management visit with Dr. Cherlyn Cornet.  He will need to be seen before his pain medication can be refilled.  Also someone scheduled his physical in November but scheduled it with Tabitha Dugal.  That will need to be changed to Dr. Cherlyn Cornet.   Dr. Cherlyn Cornet please deny will refill.  It says I don't have clearance to deny due to it being a Narcotic.

## 2023-11-08 DIAGNOSIS — R001 Bradycardia, unspecified: Secondary | ICD-10-CM | POA: Diagnosis not present

## 2023-11-08 NOTE — Telephone Encounter (Signed)
 Called pt and schedule appt for f/u on meds and CPE with correct provider

## 2023-11-09 ENCOUNTER — Ambulatory Visit (INDEPENDENT_AMBULATORY_CARE_PROVIDER_SITE_OTHER): Admitting: Family Medicine

## 2023-11-09 ENCOUNTER — Encounter: Payer: Self-pay | Admitting: Family Medicine

## 2023-11-09 VITALS — BP 120/80 | HR 58 | Temp 97.7°F | Ht 67.0 in | Wt 281.2 lb

## 2023-11-09 DIAGNOSIS — M25552 Pain in left hip: Secondary | ICD-10-CM

## 2023-11-09 DIAGNOSIS — G894 Chronic pain syndrome: Secondary | ICD-10-CM

## 2023-11-09 DIAGNOSIS — M48061 Spinal stenosis, lumbar region without neurogenic claudication: Secondary | ICD-10-CM | POA: Diagnosis not present

## 2023-11-09 DIAGNOSIS — R001 Bradycardia, unspecified: Secondary | ICD-10-CM | POA: Diagnosis not present

## 2023-11-09 MED ORDER — METOPROLOL TARTRATE 25 MG PO TABS: 25.0000 mg | ORAL_TABLET | Freq: Every day | ORAL | Status: DC

## 2023-11-09 MED ORDER — OXYCODONE-ACETAMINOPHEN 5-325 MG PO TABS: 0.5000 | ORAL_TABLET | Freq: Every day | ORAL | 0 refills | Status: DC | PRN

## 2023-11-09 NOTE — Assessment & Plan Note (Signed)
 Bradycardia  HR 33 at last OV: on metoprolol .. ZIo monitor showed high burden of PVC,  I recommend follow up with Cardiology. HR in office today 58  He has decrease metoprolol  25 mg  to once daily on his own.  No lightheaded, CP, no palpations and no SOB.

## 2023-11-09 NOTE — Assessment & Plan Note (Signed)
 Indication for chronic opioid:  chronic low back pain.. not currently an issue today, but having shoiulder and hip pain Medication and dose:  oxycodone  5/325 mg  0.5 to 1 tablet daily. # pills per  THREE month: 30 Last UDS date: due Opioid Treatment Agreement signed (Y/N):  Y 11/09/23

## 2023-11-09 NOTE — Progress Notes (Signed)
 Patient ID: Jonathon Graham, male    DOB: 22-Jul-1955, 68 y.o.   MRN: [id number]  This visit was conducted in person.  BP 120/80   Pulse (!) 58   Temp 97.7 F (36.5 C) (Temporal)   Ht 5' 7 (1.702 m)   Wt 281 lb 4 oz (127.6 kg)   SpO2 92%   BMI 44.05 kg/m    CC:  Chief Complaint  Patient presents with   Pain Management    Subjective:   HPI: Jonathon Graham is a 68 y.o. male presenting on 11/09/2023 for Pain Management      Bradycardia  HR 33 at last OV: on metoprolol .. ZIo monitor showed high burden of PVC, recommend follow up with Cardiology. HR in office today 58  He has decrease metoprolol  to once daily.  No lightheaded, CP, no palpations and no SOB. He has a history of right and left bundle branch block, heart failure with reduced ejection fraction, cardiomegaly.  Indication for chronic opioid:  chronic low back pain.. not currently an issue today, but having shoiulder and hip pain Medication and dose:  oxycodone  5/325 mg  0.5 to 1 tablet daily. # pills per  THREE month: 30 Last UDS date: due Opioid Treatment Agreement signed (Y/N):  Y 11/09/23  Opioid Treatment Agreement last reviewed with patient:   yes [place] reviewed this encounter (include red flags): Yes  no red flags. PDMP reviewed during this encounter.  Relevant past medical, surgical, family and social history reviewed and updated as indicated. Interim medical history since our last visit reviewed. Allergies and medications reviewed and updated. Outpatient Medications Prior to Visit  Medication Sig Dispense Refill   albuterol  (PROAIR  HFA) 108 (90 Base) MCG/ACT inhaler Inhale 1-2 puffs into the lungs every 4 (four) hours as needed for wheezing or shortness of breath. 18 each 12   albuterol  (PROVENTIL ) (2.5 MG/3ML) 0.083% nebulizer solution Take 3 mLs (2.5 mg total) by nebulization every 6 (six) hours as needed for wheezing or shortness of breath. 360 mL 6   aspirin  81 MG tablet Take 243 mg by mouth daily.  Takes 4 81mg  aspirin  daily.     atorvastatin  (LIPITOR) 40 MG tablet Take 1 tablet by mouth once daily 90 tablet 3   betamethasone  valerate ointment (VALISONE ) 0.1 % Apply 1 application topically 2 (two) times daily. For up to 10 days. 30 g 0   blood glucose meter kit and supplies KIT Dispense based on patient and insurance preference. Use up to four times daily as directed. (FOR ICD-9 250.00, 250.01). 1 each 0   Cholecalciferol (VITAMIN D3) 1.25 MG (50000 UT) CAPS TAKE 1 CAPSULE BY MOUTH  ONCE A WEEK 12 capsule 0   clotrimazole  (LOTRIMIN ) 1 % cream Apply 1 application 2 (two) times daily topically. For 7 days 28 g 0   Continuous Blood Gluc Sensor (FREESTYLE LIBRE 3 SENSOR) MISC 1 each by Does not apply route every 14 (fourteen) days. Place 1 sensor on the skin every 14 days. Use to check glucose continuously 2 each 5   cyclobenzaprine  (FLEXERIL ) 10 MG tablet Take 1 tablet (10 mg total) by mouth at bedtime as needed for muscle spasms. 15 tablet 0   DULoxetine  (CYMBALTA ) 60 MG capsule Take 1 capsule (60 mg total) by mouth daily. 90 capsule 1   fluticasone -salmeterol (ADVAIR) 250-50 MCG/ACT AEPB INHALE 1 DOSE BY MOUTH EVERY 12 HOURS 60 each 0   furosemide  (LASIX ) 40 MG tablet TAKE 1 TABLET BY MOUTH ONCE DAILY AS  NEEDED 90 tablet 0   gabapentin  (NEURONTIN ) 300 MG capsule TAKE 1 CAPSULE BY MOUTH THREE TIMES DAILY 270 capsule 0   ibuprofen  (ADVIL ) 800 MG tablet Take 1 tablet (800 mg total) by mouth every 8 (eight) hours as needed. 60 tablet 0   metFORMIN  (GLUCOPHAGE ) 1000 MG tablet TAKE 1 TABLET BY MOUTH TWICE DAILY WITH MEALS 180 tablet 1   ondansetron  (ZOFRAN ) 8 MG tablet Take 1 tablet (8 mg total) by mouth every 8 (eight) hours as needed for nausea or vomiting. 12 tablet 0   pantoprazole  (PROTONIX ) 40 MG tablet Take 1 tablet (40 mg total) by mouth daily. 30 tablet 0   potassium chloride  SA (KLOR-CON ) 20 MEQ tablet Take 1 tablet (20 mEq total) by mouth daily. (Patient taking differently: Take 20 mEq by  mouth once a week.) 90 tablet 0   Semaglutide , 2 MG/DOSE, (OZEMPIC , 2 MG/DOSE,) 8 MG/3ML SOPN INJECT 2 MG SUBCUTANEOUSLY  ONCE A WEEK 9 mL 1   tamsulosin  (FLOMAX ) 0.4 MG CAPS capsule Take 1 capsule by mouth once daily 90 capsule 3   metoprolol  tartrate (LOPRESSOR ) 25 MG tablet Take 1 tablet by mouth twice daily 180 tablet 2   oxyCODONE -acetaminophen  (PERCOCET/ROXICET) 5-325 MG tablet Take 0.5 tablets by mouth 2 (two) times daily as needed for severe pain (pain score 7-10). 30 tablet 0   No facility-administered medications prior to visit.     Per HPI unless specifically indicated in ROS section below Review of Systems  Constitutional:  Negative for fatigue and fever.  HENT:  Negative for ear pain.   Eyes:  Negative for pain.  Respiratory:  Negative for cough and shortness of breath.   Cardiovascular:  Negative for chest pain, palpitations and leg swelling.  Gastrointestinal:  Negative for abdominal pain.  Genitourinary:  Negative for dysuria.  Musculoskeletal:  Negative for arthralgias.  Neurological:  Negative for syncope, light-headedness and headaches.  Psychiatric/Behavioral:  Negative for dysphoric mood.    Objective:  BP 120/80   Pulse (!) 58   Temp 97.7 F (36.5 C) (Temporal)   Ht 5' 7 (1.702 m)   Wt 281 lb 4 oz (127.6 kg)   SpO2 92%   BMI 44.05 kg/m   Wt Readings from Last 3 Encounters:  11/09/23 281 lb 4 oz (127.6 kg)  10/03/23 281 lb 12.8 oz (127.8 kg)  09/26/23 282 lb (127.9 kg)      Physical Exam Constitutional:      Appearance: He is well-developed.  HENT:     Head: Normocephalic.     Right Ear: Hearing normal.     Left Ear: Hearing normal.     Nose: Nose normal.  Neck:     Thyroid : No thyroid  mass or thyromegaly.     Vascular: No carotid bruit.     Trachea: Trachea normal.   Cardiovascular:     Rate and Rhythm: Regular rhythm. Bradycardia present.     Pulses: Normal pulses.     Heart sounds: Heart sounds are distant. No murmur heard.    No  friction rub. No gallop.     Comments: No peripheral edema Pulmonary:     Effort: Pulmonary effort is normal. No respiratory distress.     Breath sounds: Normal breath sounds.   Skin:    General: Skin is warm and dry.     Findings: No rash.   Psychiatric:        Speech: Speech normal.        Behavior: Behavior normal.  Thought Content: Thought content normal.       Results for orders placed or performed in visit on 08/20/23  POCT glycosylated hemoglobin (Hb A1C)   Collection Time: 08/20/23  1:13 PM  Result Value Ref Range   Hemoglobin A1C 6.4 (A) 4.0 - 5.6 %   HbA1c POC (<> result, manual entry)     HbA1c, POC (prediabetic range)     HbA1c, POC (controlled diabetic range)      Assessment and Plan  Chronic pain syndrome Assessment & Plan: Indication for chronic opioid:  chronic low back pain.. not currently an issue today, but having shoiulder and hip pain Medication and dose:  oxycodone  5/325 mg  0.5 to 1 tablet daily. # pills per  THREE month: 30 Last UDS date: due Opioid Treatment Agreement signed (Y/N):  Y 11/09/23  Orders: -     DRUG MONITORING, PANEL 8 WITH CONFIRMATION, URINE  Left hip pain -     oxyCODONE -Acetaminophen ; Take 0.5-1 tablets by mouth daily as needed for severe pain (pain score 7-10).  Dispense: 30 tablet; Refill: 0 -     oxyCODONE -Acetaminophen ; Take 0.5-1 tablets by mouth daily as needed for severe pain (pain score 7-10).  Dispense: 30 tablet; Refill: 0 -     oxyCODONE -Acetaminophen ; Take 0.5-1 tablets by mouth daily as needed for severe pain (pain score 7-10).  Dispense: 30 tablet; Refill: 0  Spinal stenosis of lumbar region, unspecified whether neurogenic claudication present -     oxyCODONE -Acetaminophen ; Take 0.5-1 tablets by mouth daily as needed for severe pain (pain score 7-10).  Dispense: 30 tablet; Refill: 0 -     oxyCODONE -Acetaminophen ; Take 0.5-1 tablets by mouth daily as needed for severe pain (pain score 7-10).  Dispense: 30  tablet; Refill: 0 -     oxyCODONE -Acetaminophen ; Take 0.5-1 tablets by mouth daily as needed for severe pain (pain score 7-10).  Dispense: 30 tablet; Refill: 0  Bradycardia Assessment & Plan: Bradycardia  HR 33 at last OV: on metoprolol .. ZIo monitor showed high burden of PVC,  I recommend follow up with Cardiology. HR in office today 58  He has decrease metoprolol  25 mg  to once daily on his own.  No lightheaded, CP, no palpations and no SOB.    Other orders -     Metoprolol  Tartrate; Take 1 tablet (25 mg total) by mouth daily.    No follow-ups on file.   Herby Lolling, MD

## 2023-11-10 LAB — DM TEMPLATE

## 2023-11-10 LAB — DRUG MONITORING, PANEL 8 WITH CONFIRMATION, URINE
6 Acetylmorphine: NEGATIVE ng/mL (ref <10)
Alcohol Metabolites: NEGATIVE ng/mL (ref <500)
Amphetamines: NEGATIVE ng/mL (ref <500)
Benzodiazepines: NEGATIVE ng/mL (ref <100)
Buprenorphine, Urine: NEGATIVE ng/mL (ref <5)
Cocaine Metabolite: NEGATIVE ng/mL (ref <150)
Creatinine: 182.8 mg/dL (ref >=20.0)
MDMA: NEGATIVE ng/mL (ref <500)
Marijuana Metabolite: NEGATIVE ng/mL (ref <20)
Opiates: NEGATIVE ng/mL (ref <100)
Oxidant: NEGATIVE ug/mL (ref <200)
Oxycodone: NEGATIVE ng/mL (ref <100)
pH: 6.8 (ref 4.5–9.0)

## 2023-11-15 ENCOUNTER — Ambulatory Visit: Payer: Self-pay | Admitting: Family Medicine

## 2023-11-16 ENCOUNTER — Other Ambulatory Visit: Payer: Self-pay | Admitting: Family Medicine

## 2023-11-16 DIAGNOSIS — E1142 Type 2 diabetes mellitus with diabetic polyneuropathy: Secondary | ICD-10-CM

## 2023-11-16 NOTE — Telephone Encounter (Signed)
 Refill request for Gabapentin  300 MG Oral Capsule   LOV - 11/09/23 Next OV - 04/02/24 Last refill - 08/23/23 #270/0

## 2023-11-26 DIAGNOSIS — J449 Chronic obstructive pulmonary disease, unspecified: Secondary | ICD-10-CM | POA: Diagnosis not present

## 2023-11-28 ENCOUNTER — Ambulatory Visit: Attending: Cardiology | Admitting: Physician Assistant

## 2023-11-28 ENCOUNTER — Encounter: Payer: Self-pay | Admitting: Physician Assistant

## 2023-11-28 VITALS — BP 150/87 | HR 64 | Ht 67.5 in | Wt 281.2 lb

## 2023-11-28 DIAGNOSIS — I493 Ventricular premature depolarization: Secondary | ICD-10-CM

## 2023-11-28 DIAGNOSIS — I519 Heart disease, unspecified: Secondary | ICD-10-CM

## 2023-11-28 DIAGNOSIS — E785 Hyperlipidemia, unspecified: Secondary | ICD-10-CM | POA: Diagnosis not present

## 2023-11-28 MED ORDER — METOPROLOL TARTRATE 25 MG PO TABS: 25.0000 mg | ORAL_TABLET | Freq: Two times a day (BID) | ORAL | Status: DC

## 2023-11-28 NOTE — Patient Instructions (Signed)
 Medication Instructions:  INCREASE METOPROLOL  TARTRATE TO 25 MG TWICE DAILY *If you need a refill on your cardiac medications before your next appointment, please call your pharmacy*  Lab Work: TSH TODAY If you have labs (blood work) drawn today and your tests are completely normal, you will receive your results only by: MyChart Message (if you have MyChart) OR A paper copy in the mail If you have any lab test that is abnormal or we need to change your treatment, we will call you to review the results.  Testing/Procedures: NO TESTING  Follow-Up: At [facility], you and your health needs are our priority.  As part of our continuing mission to provide you with exceptional heart care, our providers are all part of one team.  This team includes your primary Cardiologist (physician) and Advanced Practice Providers or APPs (Physician Assistants and Nurse Practitioners) who all work together to provide you with the care you need, when you need it.  Your next appointment:   6 month(s)  Provider:   Dorn Lesches, MD  Other Instructions You have been referred to ELECTROPHYSIOLOGY for frequent PVCs  SWITCH COFFEE TO DECAF

## 2023-11-28 NOTE — Progress Notes (Signed)
 Cardiology Office Note   Date:  11/28/2023  ID:  Jonathon Graham, DOB [birth year], MRN [id number] PCP: Avelina Greig BRAVO, MD  [facility] Providers Cardiologist:  Dorn Lesches, MD     History of Present Illness Jonathon Graham is a 68 y.o. male with past medical history of former tobacco abuse quit last year, hyperlipidemia and diabetes.  He has no family history of heart issue.  He has been disabled due to back issue.  2D echocardiogram obtained on 01/18/2021 showed EF 40 to 45% without significant valve issue.  Etiology behind LV dysfunction was unclear.  He was started on beta-blocker therapy afterward.  Coronary CTA obtained on 03/17/2021 showed coronary calcium  score of 128 with no obstructive CAD noted.  Most recent echocardiogram obtained on 07/12/2023 showed EF 50 to 55%, septal lateral dyssynchrony consistent with bundle branch block, mild dilatation of the ascending aorta measuring 40 mm, trivial MR, grade 2 DD.  He was seen by his PCP on 10/03/2023 and heart rate in the office was 33.  EKG showed ventricular bigeminy.  Dr. Lesches subsequently ordered a heart monitor patient wore a heart monitor in June 2025 that showed PVC burden of 28.5%, heart rate ranges from 39-134, average heart rate of 65.  Patient presents today for follow-up.  He denies any cardiac awareness of PVCs.  He has no chest pain or shortness of breath.  He continue to exercise without any exertional symptom.  I reviewed his recent heart monitor with him.  He says since he saw his PCP on 5/7, he has reduced his metoprolol  from 25 mg twice a day dosing to 25 mg daily.  I asked him to go back on 25 mg twice a day dosing.  I will obtain TSH given frequent PVCs.  I asked him to cut back on caffeine.  I will refer him to EP service for management of frequent PVCs   ROS:   He denies chest pain, palpitations, dyspnea, pnd, orthopnea, n, v, dizziness, syncope, edema, weight gain, or early satiety. All other systems reviewed and are  otherwise negative except as noted above.    Studies Reviewed      Cardiac Studies & Procedures   ______________________________________________________________________________________________     ECHOCARDIOGRAM  ECHOCARDIOGRAM COMPLETE 07/12/2023  Narrative ECHOCARDIOGRAM REPORT    Patient Name:   Jonathon Graham Date of Exam: 07/12/2023 Medical Rec #:  [id number]     Height:       67.0 in Accession #:    [id number]    Weight:       282.0 lb Date of Birth:  [birth year]    BSA:          2.340 m Patient Age:    67 years      BP:           136/86 mmHg Patient Gender: M             HR:           71 bpm. Exam Location:  Outpatient  Procedure: 2D Echo, Color Doppler, Cardiac Doppler and Intracardiac Opacification Agent (Both Spectral and Color Flow Doppler were utilized during procedure).  Indications:    Hypertension associated with diabetes (HCC) [E11.59, I15.2 (ICD-10-CM)]; Hyperlipidemia associated with type 2 diabetes mellitus (HCC) [E11.69, E78.5 (ICD-10-CM)]  History:        Patient has prior history of Echocardiogram examinations, most recent 07/13/2022. COPD; Risk Factors:Diabetes, Hypertension and Dyslipidemia.  Sonographer:    Rosaline Fujisawa MHA, RDMS, RVT, RDCS  Referring Phys: [id number] JONATHAN J BERRY   Sonographer Comments: Patient is obese. Image acquisition challenging due to patient body habitus and heart rhythm. IMPRESSIONS   1. Septal-lateral dyssynchrony consistent with bundle branch block. Wall motion assessment is limited by arrhythmia and septal-lateral dyssynchrony. Left ventricular ejection fraction, by estimation, is 50 to 55%. The left ventricle has low normal function. The left ventricle demonstrates regional wall motion abnormalities (see scoring diagram/findings for description). The left ventricular internal cavity size was mildly dilated. Left ventricular diastolic parameters are consistent with Grade II diastolic dysfunction  (pseudonormalization). 2. Right ventricular systolic function is moderately reduced. The right ventricular size is mildly enlarged. There is normal pulmonary artery systolic pressure. 3. Left atrial size was mildly dilated. 4. Right atrial size was severely dilated. 5. The mitral valve is normal in structure. Trivial mitral valve regurgitation. No evidence of mitral stenosis. 6. The aortic valve is tricuspid. Aortic valve regurgitation is not visualized. No aortic stenosis is present. 7. Aortic dilatation noted. There is mild dilatation of the ascending aorta, measuring 40 mm. 8. The inferior vena cava is dilated in size with >50% respiratory variability, suggesting right atrial pressure of 8 mmHg.  FINDINGS Left Ventricle: Septal-lateral dyssynchrony consistent with bundle branch block. Wall motion assessment is limited by arrhythmia and septal-lateral dyssynchrony. Left ventricular ejection fraction, by estimation, is 50 to 55%. The left ventricle has low normal function. The left ventricle demonstrates regional wall motion abnormalities. Definity  contrast agent was given IV to delineate the left ventricular endocardial borders. Strain imaging was not performed. The left ventricular internal cavity size was mildly dilated. There is no left ventricular hypertrophy. Left ventricular diastolic parameters are consistent with Grade II diastolic dysfunction (pseudonormalization).   LV Wall Scoring: The inferior septum, entire inferior wall, and posterior wall are hypokinetic. The entire anterior wall, antero-lateral wall, anterior septum, apical lateral segment, and apex are normal.  Right Ventricle: The right ventricular size is mildly enlarged. No increase in right ventricular wall thickness. Right ventricular systolic function is moderately reduced. There is normal pulmonary artery systolic pressure. The tricuspid regurgitant velocity is 1.34 m/s, and with an assumed right atrial pressure of 8  mmHg, the estimated right ventricular systolic pressure is 15.2 mmHg.  Left Atrium: Left atrial size was mildly dilated.  Right Atrium: Right atrial size was severely dilated.  Pericardium: There is no evidence of pericardial effusion.  Mitral Valve: The mitral valve is normal in structure. Trivial mitral valve regurgitation. No evidence of mitral valve stenosis.  Tricuspid Valve: The tricuspid valve is normal in structure. Tricuspid valve regurgitation is trivial. No evidence of tricuspid stenosis.  Aortic Valve: The aortic valve is tricuspid. Aortic valve regurgitation is not visualized. No aortic stenosis is present. Aortic valve mean gradient measures 6.0 mmHg. Aortic valve peak gradient measures 11.2 mmHg. Aortic valve area, by VTI measures 1.30 cm.  Pulmonic Valve: The pulmonic valve was normal in structure. Pulmonic valve regurgitation is not visualized. No evidence of pulmonic stenosis.  Aorta: Aortic dilatation noted. There is mild dilatation of the ascending aorta, measuring 40 mm.  Venous: The inferior vena cava is dilated in size with greater than 50% respiratory variability, suggesting right atrial pressure of 8 mmHg.  IAS/Shunts: No atrial level shunt detected by color flow Doppler.  Additional Comments: 3D imaging was not performed.   LEFT VENTRICLE PLAX 2D LVIDd:         6.49 cm LVIDs:         4.50 cm LV PW:  0.92 cm LV IVS:        0.82 cm LVOT diam:     1.60 cm LV SV:         47 LV SV Index:   20 LVOT Area:     2.01 cm   RIGHT VENTRICLE RV Basal diam:  4.85 cm RV Mid diam:    4.87 cm TAPSE (M-mode): 2.4 cm  LEFT ATRIUM           Index        RIGHT ATRIUM           Index LA diam:      5.65 cm 2.41 cm/m   RA Area:     28.70 cm LA Vol (A4C): 72.2 ml 30.85 ml/m  RA Volume:   106.00 ml 45.30 ml/m AORTIC VALVE AV Area (Vmax):    1.34 cm AV Area (Vmean):   1.18 cm AV Area (VTI):     1.30 cm AV Vmax:           167.00 cm/s AV Vmean:           117.000 cm/s AV VTI:            0.360 m AV Peak Grad:      11.2 mmHg AV Mean Grad:      6.0 mmHg LVOT Vmax:         111.00 cm/s LVOT Vmean:        68.500 cm/s LVOT VTI:          0.233 m LVOT/AV VTI ratio: 0.65  AORTA Ao Root diam: 3.46 cm Ao Asc diam:  3.89 cm  MR Peak grad: 8.2 mmHg    TRICUSPID VALVE MR Vmax:      143.00 cm/s TR Peak grad:   7.2 mmHg TR Vmax:        134.00 cm/s  SHUNTS Systemic VTI:  0.23 m Systemic Diam: 1.60 cm  Annabella Scarce MD Electronically signed by Annabella Scarce MD Signature Date/Time: 07/12/2023/10:59:27 AM    Final    MONITORS  LONG TERM MONITOR (3-14 DAYS) 11/06/2023  Narrative HR 39 to 134, average 65. Rare supraventricular ectopy. Frequent ventricular ectopy, 28.5%. No sustained arrhythmias. No atrial fibrillation.  Ole T. Cindie, MD, [facility], [facility] Cardiac Electrophysiology   CT SCANS  CT CORONARY MORPH W/CTA COR W/SCORE 03/25/2021  Addendum 03/25/2021 12:59 PM ADDENDUM REPORT: 03/25/2021 12:57  CLINICAL DATA:  68 year old with chest pain.  EXAM: Cardiac/Coronary  CTA  TECHNIQUE: The patient was scanned on a [employer].  FINDINGS: A 120 kV prospective scan was triggered in the descending thoracic aorta at 111 HU's. Axial non-contrast 3 mm slices were carried out through the heart. The data set was analyzed on a dedicated work station and scored using the Agatson method. Gantry rotation speed was 250 msecs and collimation was .6 mm. No beta blockade and 0.8 mg of sl NTG was given. The 3D data set was reconstructed in 5% intervals of the 67-82 % of the R-R cycle. Diastolic phases were analyzed on a dedicated work station using MPR, MIP and VRT modes. The patient received 80 cc of contrast.  Image quality: Fair. Misregistration artifact present. Images are however interpretable.  Aorta:  Normal size.  No calcifications.  No dissection.  Aortic Valve:  Trileaflet.  No calcifications.  Coronary  Arteries:  Normal coronary origin.  Right dominance.  RCA is a large dominant artery that gives rise to PDA and PLA. There is no plaque.  Left main is a large artery that gives rise to LAD, Ramus and LCX arteries.  LAD is a large vessel that has scattered mid calcified plaque, 0-24% stenosis.  LCX is a non-dominant artery that gives rise to one large OM1 branch. There is mild focal calcified plaque, 0-24% stenosis.  Other findings:  Normal pulmonary vein drainage into the left atrium.  Normal left atrial appendage without a thrombus.  Normal size of the pulmonary artery.  Please see radiology report for non cardiac findings.  IMPRESSION: 1. Coronary calcium  score of 128. This was 58 percentile for age and sex matched control.  2. Normal coronary origin with right dominance.  3. Non flow limiting coronary LAD, LCX (0-24%) .   Electronically Signed By: Oneil Parchment M.D. On: 03/25/2021 12:57  Narrative EXAM: OVER-READ INTERPRETATION  CT CHEST  The following report is an over-read performed by radiologist Dr. Toribio Aye of [facility] on 03/25/2021. This over-read does not include interpretation of cardiac or coronary anatomy or pathology. The coronary calcium  score/coronary CTA interpretation by the cardiologist is attached.  COMPARISON:  None.  FINDINGS: Within the visualized portions of the thorax there are no suspicious appearing pulmonary nodules or masses, there is no acute consolidative airspace disease, no pleural effusions, no pneumothorax and no lymphadenopathy. Visualized portions of the upper abdomen are unremarkable. There are no aggressive appearing lytic or blastic lesions noted in the visualized portions of the skeleton.  IMPRESSION: 1. No significant incidental noncardiac findings are noted.  Electronically Signed: By: Toribio Aye M.D. On: 03/25/2021 11:29      ______________________________________________________________________________________________      Risk Assessment/Calculations          Physical Exam VS:  BP (!) 150/87   Pulse 64   Ht 5' 7.5 (1.715 m)   Wt 281 lb 3.2 oz (127.6 kg)   SpO2 92%   BMI 43.39 kg/m        Wt Readings from Last 3 Encounters:  11/28/23 281 lb 3.2 oz (127.6 kg)  11/09/23 281 lb 4 oz (127.6 kg)  10/03/23 281 lb 12.8 oz (127.8 kg)    GEN: Well nourished, well developed in no acute distress NECK: No JVD; No carotid bruits CARDIAC: RRR, no murmurs, rubs, gallops RESPIRATORY:  Clear to auscultation without rales, wheezing or rhonchi  ABDOMEN: Soft, non-tender, non-distended EXTREMITIES:  No edema; No deformity   ASSESSMENT AND PLAN  Frequent PVCs: Patient was noted to have heart rate in the 30s during office visit with PCP back in May.  EKG at the time shows he was actually in ventricular bigeminy with heart rate in the 60s.  He reduced his metoprolol  tartrate from 25 mg twice a day to 25 mg daily.  A heart monitor was subsequently ordered which showed PVC burden of 28%.  I reviewed heart monitor results with him, I would recommend he go back on the 25 mg twice a day of metoprolol  tartrate.  I will refer him to EP service to consider antiarrhythmic therapy given prior history of LV dysfunction.  History of LV dysfunction: Initially diagnosed in 2022 with a EF of 40 to 45%, most recent echocardiogram obtained in February this year that showed EF of 50 to 55%.  Previous coronary CTA obtained in October 2022 showed nonobstructive CAD.       Dispo: Will refer patient to EP service for management of frequent PVCs.  Signed, [name], PA

## 2023-11-29 LAB — TSH: TSH: 1.17 u[IU]/mL (ref 0.450–4.500)

## 2023-12-03 ENCOUNTER — Ambulatory Visit: Payer: Self-pay | Admitting: Physician Assistant

## 2023-12-03 NOTE — Progress Notes (Signed)
 Normal thyroid level

## 2023-12-07 DIAGNOSIS — G4733 Obstructive sleep apnea (adult) (pediatric): Secondary | ICD-10-CM | POA: Diagnosis not present

## 2023-12-18 ENCOUNTER — Other Ambulatory Visit: Payer: Self-pay | Admitting: Family Medicine

## 2023-12-18 DIAGNOSIS — I10 Essential (primary) hypertension: Secondary | ICD-10-CM

## 2023-12-21 ENCOUNTER — Other Ambulatory Visit: Payer: Self-pay | Admitting: Cardiovascular Disease

## 2023-12-21 ENCOUNTER — Other Ambulatory Visit: Payer: Self-pay | Admitting: Family Medicine

## 2023-12-21 DIAGNOSIS — E119 Type 2 diabetes mellitus without complications: Secondary | ICD-10-CM

## 2023-12-24 ENCOUNTER — Other Ambulatory Visit: Payer: Self-pay | Admitting: Family Medicine

## 2023-12-24 DIAGNOSIS — G4733 Obstructive sleep apnea (adult) (pediatric): Secondary | ICD-10-CM | POA: Diagnosis not present

## 2023-12-24 DIAGNOSIS — E119 Type 2 diabetes mellitus without complications: Secondary | ICD-10-CM

## 2023-12-25 DIAGNOSIS — G4733 Obstructive sleep apnea (adult) (pediatric): Secondary | ICD-10-CM | POA: Diagnosis not present

## 2023-12-26 DIAGNOSIS — J449 Chronic obstructive pulmonary disease, unspecified: Secondary | ICD-10-CM | POA: Diagnosis not present

## 2023-12-31 NOTE — Progress Notes (Unsigned)
 Electrophysiology Office Note:   Date:  01/01/2024  ID:  Jonathon Graham, DOB [birth year], MRN [id number]  Primary Cardiologist: Jonathon Lesches, MD Primary Heart Failure: None Electrophysiologist: Jonathon Mcmasters Gladis Norton, MD      History of Present Illness:   Jonathon Graham is a 68 y.o. male with h/o for tobacco abuse, hyperlipidemia, heart failure, PVCs seen today for  for Electrophysiology evaluation of PVCs at the request of Jonathon Graham.    He has been disabled due to his back issue.  Echo in 2022 showed an ejection fraction of 40 to 45%.  He was started on a beta-blocker.  He had an elevated calcium  score but nonobstructive coronary artery and echo showed a ejection fraction of 50-50 with septal and lateral dyssynchrony.  He saw his primary physician 10/03/2023 with heart rates of 33 EKG showed ventricular bigeminy.  Patient wore a cardiac monitor that showed a 28% PVC burden.  On follow-up in cardiology clinic, she was unaware of PVCs.  Today, denies symptoms of palpitations, chest pain, dyspnea, orthopnea, PND, lower extremity edema, claudication, dizziness, presyncope, syncope, bleeding, or neurologic sequela. The patient is tolerating medications without difficulties.  He is overall feeling well.  He has no chest pain or shortness of breath.  He has no acute complaints at this time.  He is able to exercise without issue.  Review of systems complete and found to be negative unless listed in HPI.   EP Information / Studies Reviewed:    EKG is ordered today. Personal review as below.  EKG Interpretation Date/Time:  Tuesday January 01 2024 09:48:56 EDT Ventricular Rate:  65 PR Interval:  180 QRS Duration:  178 QT Interval:  482 QTC Calculation: 501 R Axis:   -84  Text Interpretation: Sinus rhythm with sinus arrhythmia with frequent Premature ventricular complexes Right bundle branch block Left anterior fascicular block Bifascicular block Septal infarct (cited on or before 29-Jun-2023) T wave  abnormality, consider inferolateral ischemia When compared with ECG of 29-Jun-2023 09:29, Premature ventricular complexes are now Present Confirmed by Jonathon Graham ([id number]) on 01/01/2024 9:57:52 AM     Risk Assessment/Calculations:            Physical Exam:   VS:  BP 128/63 (BP Location: Left Arm, Patient Position: Sitting, Cuff Size: Large)   Pulse 65   Ht 5' 7.5 (1.715 m)   Wt 283 lb (128.4 kg)   SpO2 94%   BMI 43.67 kg/m    Wt Readings from Last 3 Encounters:  01/01/24 283 lb (128.4 kg)  11/28/23 281 lb 3.2 oz (127.6 kg)  11/09/23 281 lb 4 oz (127.6 kg)     GEN: Well nourished, well developed in no acute distress NECK: No JVD; No carotid bruits CARDIAC: Regular rate and rhythm with occasional ectopy, no murmurs, rubs, gallops RESPIRATORY:  Clear to auscultation without rales, wheezing or rhonchi  ABDOMEN: Soft, non-tender, non-distended EXTREMITIES:  No edema; No deformity   ASSESSMENT AND PLAN:    1.  PVCs: 28% burden.  He feels well.  He is unaware of his PVCs.  He exercise without issue.  For now, he would like to hold off on further therapy.  If he does develop symptoms, would likely plan for mexiletine.  Jonathon Graham plan for transthoracic echo to ensure that his ejection fraction has remained stable.  If his ejection fraction has dropped, would treat PVCs.  He does have right bundle branch block and thus class Ic antiarrhythmics would be contraindicated.  2.  History of LV  dysfunction: Ejection fraction has since normalized.  No coronary artery.  No current chest pain.  No obvious volume overload.  Follow up with Dr. Inocencio in 6 months  Signed, Jonathon Pelzer Gladis Inocencio, MD

## 2024-01-01 ENCOUNTER — Ambulatory Visit: Attending: Cardiology | Admitting: Cardiology

## 2024-01-01 ENCOUNTER — Encounter: Payer: Self-pay | Admitting: Cardiology

## 2024-01-01 VITALS — BP 128/63 | HR 65 | Ht 67.5 in | Wt 283.0 lb

## 2024-01-01 DIAGNOSIS — R001 Bradycardia, unspecified: Secondary | ICD-10-CM

## 2024-01-01 DIAGNOSIS — I493 Ventricular premature depolarization: Secondary | ICD-10-CM

## 2024-01-01 NOTE — Patient Instructions (Signed)
 Medication Instructions:  Your physician recommends that you continue on your current medications as directed. Please refer to the Current Medication list given to you today.  *If you need a refill on your cardiac medications before your next appointment, please call your pharmacy*  Lab Work: None ordered   Testing/Procedures: Your physician has requested that you have an echocardiogram. Echocardiography is a painless test that uses sound waves to create images of your heart. It provides your doctor with information about the size and shape of your heart and how well your heart's chambers and valves are working. This procedure takes approximately one hour. There are no restrictions for this procedure. Please do NOT wear cologne, perfume, aftershave, or lotions (deodorant is allowed). Please arrive 15 minutes prior to your appointment time.  Please note: We ask at that you not bring children with you during ultrasound (echo/ vascular) testing. Due to room size and safety concerns, children are not allowed in the ultrasound rooms during exams. Our front office staff cannot provide observation of children in our lobby area while testing is being conducted. An adult accompanying a patient to their appointment will only be allowed in the ultrasound room at the discretion of the ultrasound technician under special circumstances. We apologize for any inconvenience.   Follow-Up: At [facility], you and your health needs are our priority.  As part of our continuing mission to provide you with exceptional heart care, our providers are all part of one team.  This team includes your primary Cardiologist (physician) and Advanced Practice Providers or APPs (Physician Assistants and Nurse Practitioners) who all work together to provide you with the care you need, when you need it.  Your next appointment:   6 month(s)  Provider:   Soyla Norton, MD in [place]    Thank you for choosing [facility]!!   Maeola Domino, RN [phone]

## 2024-01-07 DIAGNOSIS — G4733 Obstructive sleep apnea (adult) (pediatric): Secondary | ICD-10-CM | POA: Diagnosis not present

## 2024-01-21 ENCOUNTER — Ambulatory Visit: Payer: Self-pay | Admitting: Cardiology

## 2024-01-21 ENCOUNTER — Ambulatory Visit: Attending: Cardiology

## 2024-01-21 DIAGNOSIS — I5030 Unspecified diastolic (congestive) heart failure: Secondary | ICD-10-CM | POA: Diagnosis not present

## 2024-01-21 DIAGNOSIS — I493 Ventricular premature depolarization: Secondary | ICD-10-CM | POA: Diagnosis not present

## 2024-01-21 DIAGNOSIS — I088 Other rheumatic multiple valve diseases: Secondary | ICD-10-CM

## 2024-01-21 LAB — ECHOCARDIOGRAM COMPLETE
AR max vel: 2.36 cm2
AV Area VTI: 2.35 cm2
AV Area mean vel: 2.1 cm2
AV Mean grad: 3 mmHg
AV Peak grad: 4.2 mmHg
Ao pk vel: 1.02 m/s
Area-P 1/2: 1.16 cm2
MV VTI: 1.17 cm2
P 1/2 time: 408 ms
S' Lateral: 4.4 cm

## 2024-01-22 ENCOUNTER — Ambulatory Visit: Payer: Self-pay | Admitting: *Deleted

## 2024-01-22 ENCOUNTER — Ambulatory Visit (HOSPITAL_BASED_OUTPATIENT_CLINIC_OR_DEPARTMENT_OTHER): Admission: EM | Admit: 2024-01-22 | Discharge: 2024-01-22 | Disposition: A | Discharge status: Home / Self Care

## 2024-01-22 NOTE — Telephone Encounter (Signed)
 If patient worsening... should be seen earlier at urgent care.
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
## 2024-01-22 NOTE — Telephone Encounter (Signed)
 There are no sooner available appointments with Dr. Avelina.

## 2024-01-23 NOTE — Telephone Encounter (Signed)
 Mr. Jonathon Graham notified as instructed by telephone.  He states he is fine to wait until tomorrows appointment.

## 2024-01-24 ENCOUNTER — Ambulatory Visit (INDEPENDENT_AMBULATORY_CARE_PROVIDER_SITE_OTHER): Admitting: Family Medicine

## 2024-01-24 DIAGNOSIS — R0789 Other chest pain: Secondary | ICD-10-CM

## 2024-01-24 DIAGNOSIS — M549 Dorsalgia, unspecified: Secondary | ICD-10-CM
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
## 2024-01-25 ENCOUNTER — Telehealth: Payer: Self-pay | Admitting: Family Medicine

## 2024-01-25 NOTE — Telephone Encounter (Signed)
 Requested Diabetic Eye Exam from [facility] on 01/24/24.  Received fax back stating last eye exam was 2023 which we already have on file.

## 2024-01-26 DIAGNOSIS — J449 Chronic obstructive pulmonary disease, unspecified: Secondary | ICD-10-CM | POA: Diagnosis not present

## 2024-01-28 DIAGNOSIS — G4733 Obstructive sleep apnea (adult) (pediatric): Secondary | ICD-10-CM | POA: Diagnosis not present

## 2024-02-07 ENCOUNTER — Other Ambulatory Visit (HOSPITAL_COMMUNITY)

## 2024-02-07 DIAGNOSIS — G4733 Obstructive sleep apnea (adult) (pediatric): Secondary | ICD-10-CM | POA: Diagnosis not present

## 2024-02-14 ENCOUNTER — Ambulatory Visit: Payer: Self-pay

## 2024-02-14 NOTE — Telephone Encounter (Signed)
 Appointment with Dr. Avelina 02/15/2024.

## 2024-02-14 NOTE — Telephone Encounter (Signed)
 FYI Only or Action Required?: FYI only for provider.  Patient was last seen in primary care on 01/24/2024 by Avelina Greig BRAVO, MD.  Called Nurse Triage reporting Joint Swelling.  Symptoms began a week ago.  Interventions attempted: Nothing.  Symptoms are: unchanged.  Triage Disposition: See PCP When Office is Open (Within 3 Days)  Patient/caregiver understands and will follow disposition?:   Copied from CRM [id number]. Topic: Clinical - Red Word Triage >> Feb 14, 2024 10:53 AM Robinson H wrote: [facility] that prompted transfer to Nurse Triage: Having swelling in left ankle about a week Reason for Disposition  [1] MILD swelling of both ankles (e.g., ankle joints look swollen; or bilateral mild pedal edema) AND [2] new-onset or getting worse  (Exceptions: Caused by hot weather, already seen by doctor or NP/PA for this.)  Answer Assessment - Initial Assessment Questions 1. LOCATION: Which ankle is swollen? Where is the swelling?     Left ankle swelling 2. ONSET: When did the swelling start?     Five days ago 3. SWELLING: How bad is the swelling? Or, How large is it? (e.g., mild, moderate, severe; size of localized swelling)      moderate 4. PAIN: Is there any pain? If Yes, ask: How bad is it? (Scale 0-10; or none, mild, moderate, severe)     Stiff pain 5. CAUSE: What do you think caused the ankle swelling?     Has had this problem a few years ago and took lasix  6. OTHER SYMPTOMS: Do you have any other symptoms? (e.g., fever, chest pain, difficulty breathing, calf pain)     denies 7. PREGNANCY: Is there any chance you are pregnant? When was your last menstrual period?     N/A  Protocols used: Ankle Swelling-A-AH

## 2024-02-15 ENCOUNTER — Ambulatory Visit: Admitting: Family Medicine

## 2024-02-15 ENCOUNTER — Ambulatory Visit: Payer: Self-pay | Admitting: Family Medicine

## 2024-02-15 ENCOUNTER — Encounter: Payer: Self-pay | Admitting: Family Medicine

## 2024-02-15 VITALS — BP 90/50 | HR 40 | Temp 97.0°F | Ht 67.0 in | Wt 286.1 lb

## 2024-02-15 DIAGNOSIS — M25472 Effusion, left ankle: Secondary | ICD-10-CM | POA: Diagnosis not present

## 2024-02-15 DIAGNOSIS — R21 Rash and other nonspecific skin eruption: Secondary | ICD-10-CM

## 2024-02-15 DIAGNOSIS — E1142 Type 2 diabetes mellitus with diabetic polyneuropathy: Secondary | ICD-10-CM

## 2024-02-15 DIAGNOSIS — N481 Balanitis: Secondary | ICD-10-CM

## 2024-02-15 DIAGNOSIS — K529 Noninfective gastroenteritis and colitis, unspecified: Secondary | ICD-10-CM

## 2024-02-15 LAB — COMPREHENSIVE METABOLIC PANEL WITH GFR
ALT: 13 U/L (ref 0–53)
AST: 15 U/L (ref 0–37)
Albumin: 4.3 g/dL (ref 3.5–5.2)
Alkaline Phosphatase: 51 U/L (ref 39–117)
BUN: 15 mg/dL (ref 6–23)
CO2: 33 meq/L — ABNORMAL HIGH (ref 19–32)
Calcium: 9.3 mg/dL (ref 8.4–10.5)
Chloride: 102 meq/L (ref 96–112)
Creatinine, Ser: 0.87 mg/dL (ref 0.40–1.50)
GFR: 90.39 mL/min (ref >60.00)
Glucose, Bld: 111 mg/dL — ABNORMAL HIGH (ref 70–99)
Potassium: 3.9 meq/L (ref 3.5–5.1)
Sodium: 142 meq/L (ref 135–145)
Total Bilirubin: 0.7 mg/dL (ref 0.2–1.2)
Total Protein: 7 g/dL (ref 6.0–8.3)

## 2024-02-15 LAB — HM DIABETES FOOT EXAM

## 2024-02-15 LAB — MICROALBUMIN / CREATININE URINE RATIO
Creatinine,U: 211.4 mg/dL
Microalb Creat Ratio: 10.1 mg/g (ref 0.0–30.0)
Microalb, Ur: 2.1 mg/dL — ABNORMAL HIGH (ref 0.0–1.9)

## 2024-02-15 LAB — BRAIN NATRIURETIC PEPTIDE: Pro B Natriuretic peptide (BNP): 443 pg/mL — ABNORMAL HIGH (ref 0.0–100.0)

## 2024-02-15 MED ORDER — ONDANSETRON HCL 8 MG PO TABS: 8.0000 mg | ORAL_TABLET | Freq: Three times a day (TID) | ORAL | 0 refills | Status: AC | PRN

## 2024-02-15 MED ORDER — CLOTRIMAZOLE 1 % EX CREA: 1.0000 | TOPICAL_CREAM | Freq: Two times a day (BID) | CUTANEOUS | 0 refills | Status: AC

## 2024-02-15 MED ORDER — PANTOPRAZOLE SODIUM 40 MG PO TBEC: 40.0000 mg | DELAYED_RELEASE_TABLET | Freq: Every day | ORAL | 1 refills | Status: AC

## 2024-02-15 MED ORDER — BETAMETHASONE VALERATE 0.1 % EX OINT: 1.0000 | TOPICAL_OINTMENT | Freq: Two times a day (BID) | CUTANEOUS | 0 refills | Status: AC

## 2024-02-15 NOTE — Patient Instructions (Signed)
 Get Screened for Lung Cancer Referral placed for a low-dose CT lung cancer screening at [facility]. Call our nurse navigators at: [place] [facility] [place] - [phone]

## 2024-02-15 NOTE — Assessment & Plan Note (Signed)
 Lab Results  Component Value Date   HGBA1C 6.4 (A) 08/20/2023   Due for eye exam, urine microalbumin and foot exam.  Set up yearly eye exam for diabetes and have the opthalmologist send us  a copy of the evaluation for the chart.
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
## 2024-02-15 NOTE — Addendum Note (Signed)
 Addended by: WENDELL ARLAND RAMAN on: 02/15/2024 09:26 AM   Modules accepted: Orders

## 2024-02-18 DIAGNOSIS — R0789 Other chest pain: Secondary | ICD-10-CM | POA: Insufficient documentation

## 2024-02-18 NOTE — Assessment & Plan Note (Signed)
 Acute, recommend heat and range of motion exercises.  No tenderness to palpation at vertebrae, no red flags, no indication for imaging. Can use Tylenol  for pain with oxycodone  for breakthrough. Return and ER precautions provided

## 2024-02-18 NOTE — Assessment & Plan Note (Signed)
 Acute, improving Continue using Tylenol  Extra Strength 2 tablets up to 3 times a day as needed for pain.  Has oxycodone  to use for breakthrough pain.

## 2024-02-28 DIAGNOSIS — Z1211 Encounter for screening for malignant neoplasm of colon: Secondary | ICD-10-CM | POA: Diagnosis not present

## 2024-03-08 DIAGNOSIS — G4733 Obstructive sleep apnea (adult) (pediatric): Secondary | ICD-10-CM | POA: Diagnosis not present

## 2024-03-15 ENCOUNTER — Other Ambulatory Visit: Payer: Self-pay | Admitting: Family Medicine

## 2024-03-15 DIAGNOSIS — I10 Essential (primary) hypertension: Secondary | ICD-10-CM

## 2024-03-17 ENCOUNTER — Other Ambulatory Visit: Payer: Self-pay | Admitting: Physician Assistant

## 2024-03-21 LAB — COLOGUARD: COLOGUARD: NEGATIVE

## 2024-03-26 ENCOUNTER — Other Ambulatory Visit: Payer: Self-pay | Admitting: Family Medicine

## 2024-03-26 DIAGNOSIS — E119 Type 2 diabetes mellitus without complications: Secondary | ICD-10-CM

## 2024-03-27 DIAGNOSIS — J449 Chronic obstructive pulmonary disease, unspecified: Secondary | ICD-10-CM | POA: Diagnosis not present

## 2024-03-31 ENCOUNTER — Encounter: Admitting: Family

## 2024-03-31 ENCOUNTER — Encounter: Payer: Self-pay | Admitting: Radiology

## 2024-04-02 ENCOUNTER — Ambulatory Visit: Admitting: Family Medicine

## 2024-04-02 ENCOUNTER — Encounter: Payer: Self-pay | Admitting: Family Medicine

## 2024-04-02 VITALS — BP 118/82 | HR 64 | Temp 98.3°F | Resp 18 | Ht 67.0 in | Wt 283.4 lb

## 2024-04-02 DIAGNOSIS — E662 Morbid (severe) obesity with alveolar hypoventilation: Secondary | ICD-10-CM

## 2024-04-02 DIAGNOSIS — E1149 Type 2 diabetes mellitus with other diabetic neurological complication: Secondary | ICD-10-CM

## 2024-04-02 DIAGNOSIS — J439 Emphysema, unspecified: Secondary | ICD-10-CM

## 2024-04-02 DIAGNOSIS — E1159 Type 2 diabetes mellitus with other circulatory complications: Secondary | ICD-10-CM

## 2024-04-02 DIAGNOSIS — Z0000 Encounter for general adult medical examination without abnormal findings: Secondary | ICD-10-CM

## 2024-04-02 DIAGNOSIS — E1142 Type 2 diabetes mellitus with diabetic polyneuropathy: Secondary | ICD-10-CM

## 2024-04-02 DIAGNOSIS — E559 Vitamin D deficiency, unspecified: Secondary | ICD-10-CM

## 2024-04-02 DIAGNOSIS — Z7984 Long term (current) use of oral hypoglycemic drugs: Secondary | ICD-10-CM | POA: Diagnosis not present

## 2024-04-02 DIAGNOSIS — I152 Hypertension secondary to endocrine disorders: Secondary | ICD-10-CM

## 2024-04-02 DIAGNOSIS — M48061 Spinal stenosis, lumbar region without neurogenic claudication: Secondary | ICD-10-CM

## 2024-04-02 DIAGNOSIS — N401 Enlarged prostate with lower urinary tract symptoms: Secondary | ICD-10-CM

## 2024-04-02 DIAGNOSIS — Z6841 Body Mass Index (BMI) 40.0 and over, adult: Secondary | ICD-10-CM

## 2024-04-02 DIAGNOSIS — E785 Hyperlipidemia, unspecified: Secondary | ICD-10-CM

## 2024-04-02 DIAGNOSIS — I5020 Unspecified systolic (congestive) heart failure: Secondary | ICD-10-CM

## 2024-04-02 DIAGNOSIS — E1169 Type 2 diabetes mellitus with other specified complication: Secondary | ICD-10-CM

## 2024-04-02 DIAGNOSIS — E119 Type 2 diabetes mellitus without complications: Secondary | ICD-10-CM

## 2024-04-02 DIAGNOSIS — M25552 Pain in left hip: Secondary | ICD-10-CM

## 2024-04-02 DIAGNOSIS — F322 Major depressive disorder, single episode, severe without psychotic features: Secondary | ICD-10-CM

## 2024-04-02 DIAGNOSIS — I7121 Aneurysm of the ascending aorta, without rupture: Secondary | ICD-10-CM

## 2024-04-02 DIAGNOSIS — G894 Chronic pain syndrome: Secondary | ICD-10-CM

## 2024-04-02 DIAGNOSIS — R351 Nocturia: Secondary | ICD-10-CM

## 2024-04-02 DIAGNOSIS — Z125 Encounter for screening for malignant neoplasm of prostate: Secondary | ICD-10-CM

## 2024-04-02 MED ORDER — OXYCODONE-ACETAMINOPHEN 5-325 MG PO TABS: 0.5000 | ORAL_TABLET | Freq: Every day | ORAL | 0 refills | Status: AC | PRN

## 2024-04-02 MED ORDER — TAMSULOSIN HCL 0.4 MG PO CAPS: 0.8000 mg | ORAL_CAPSULE | Freq: Every day | ORAL | 3 refills | Status: DC

## 2024-04-02 NOTE — Assessment & Plan Note (Signed)
 2D echo performed 07/13/2022 revealing ascending thoracic aorta measuring 45 mm.   Followed By Cardiology. Repeat ECHO stable 12/2023

## 2024-04-02 NOTE — Assessment & Plan Note (Signed)
 Due for re-eval.   Atorvastatin  40 mg daily

## 2024-04-02 NOTE — Assessment & Plan Note (Signed)
 Working on weight loss with Ozempic.  On oxygen with ambulation and sleep.

## 2024-04-02 NOTE — Assessment & Plan Note (Signed)
 Due for re-eval.

## 2024-04-02 NOTE — Assessment & Plan Note (Signed)
 He has continued to have weight loss on Ozempic  at 2 mg weekly.   Encouraged exercise, weight loss, healthy eating habits.

## 2024-04-02 NOTE — Assessment & Plan Note (Signed)
 Chronic, worsened symptoms nocturia... increase flomax  to 0.8 mg daily

## 2024-04-02 NOTE — Assessment & Plan Note (Signed)
 Stable  chronic.      Metoprolol  25 mg twice daily

## 2024-04-02 NOTE — Assessment & Plan Note (Signed)
 Chronic, stable  Continue albuterol  for rescue and  Advair  BID

## 2024-04-02 NOTE — Assessment & Plan Note (Addendum)
 Indication for chronic opioid:  chronic low back pain.. not currently an issue today, but having shoulder and hip pain Medication and dose:  oxycodone  5/325 mg  0.5 to 1 tablet daily. # pills per  THREE month: 30 Last UDS date: 11/09/2023 Opioid Treatment Agreement signed (Y/N):  Y 11/09/23  PDMP reviewed during this encounter.

## 2024-04-02 NOTE — Assessment & Plan Note (Signed)
Stable, tolerable control on gabapentin

## 2024-04-02 NOTE — Assessment & Plan Note (Signed)
 Lab Results  Component Value Date   HGBA1C 6.4 (A) 08/20/2023   Due for  A1C . Uptodate with eye exam, urine microalbumin and foot exam.

## 2024-04-02 NOTE — Progress Notes (Signed)
 Patient ID: Jonathon Graham, male    DOB: 03-Sep-1955, 68 y.o.   MRN: [id number]  This visit was conducted in person.  BP 118/82   Pulse 64   Temp 98.3 F (36.8 C) (Oral)   Resp 18   Ht 5' 7 (1.702 m)   Wt 283 lb 6 oz (128.5 kg)   SpO2 96%   BMI 44.38 kg/m    CC:  Chief Complaint  Patient presents with   Annual Exam    Diabetic eye exam- has had at [facility] Flu shot- declines     Subjective:   HPI: Jonathon Graham is a 68 y.o. male presenting on 04/02/2024 for Annual Exam (Diabetic eye exam- has had at [facility]/Flu shot- declines/)  The patient presents for  complete physical and review of chronic health problems. He/She also has the following acute concerns today: none  The patient saw a LPN or RN for medicare wellness visit. 09/26/2023  Prevention and wellness was reviewed in detail. Note reviewed and important notes copied below.  Diabetes: Well-controlled on metformin  1000 mg p.o. daily and Ozempic  2 mg weekly Lab Results  Component Value Date   HGBA1C 6.4 (A) 08/20/2023  Using medications without difficulties: Hypoglycemic episodes:  none Hyperglycemic episodes:none Feet problems: Associated with neuropathy Blood Sugars averaging: FBS 84-100 eye exam within last year:  upcoming 04/02/2023  Wt Readings from Last 3 Encounters:  04/02/24 283 lb 6 oz (128.5 kg)  02/15/24 286 lb 2 oz (129.8 kg)  01/24/24 281 lb 2 oz (127.5 kg)  Body mass index is 44.38 kg/m.   Elevated Cholesterol: Cholesterol at goal < 70 on atorvastatin  40 mg p.o. daily. Lab Results  Component Value Date   CHOL 96 01/09/2023   HDL 38.40 (L) 01/09/2023   LDLCALC 45 01/09/2023   TRIG 59.0 01/09/2023   CHOLHDL 2 01/09/2023  Using medications without problems: none Muscle aches: none Diet compliance: good Exercise: more active over the summer Other complaints:   HFrEF followed by cardiology.  Hypertension:  Well controlled , off metoprolol  25 mg p.o. BID given  bradycardia. BP Readings from Last 3 Encounters:  04/02/24 118/82  02/15/24 (!) 90/50  01/24/24 (!) 110/54  Using medication without problems or lightheadedness:  none Chest pain with exertion: none Edema: none Short of breath: none Average home BPs: Other issues:   MDD:  moderately controlled on cymbalta  60 mg daily  COPD: Chronic, stable  Using albuterol  as needed and Advair 250/151 puff twice daily as controller  2D echo performed 07/13/2022 revealing ascending thoracic aorta measuring 45 mm.   Followed By Cardiology. Repeat ECHO stable 12/2023   Relevant past medical, surgical, family and social history reviewed and updated as indicated. Interim medical history since our last visit reviewed. Allergies and medications reviewed and updated. Outpatient Medications Prior to Visit  Medication Sig Dispense Refill   albuterol  (PROAIR  HFA) 108 (90 Base) MCG/ACT inhaler Inhale 1-2 puffs into the lungs every 4 (four) hours as needed for wheezing or shortness of breath. 18 each 12   albuterol  (PROVENTIL ) (2.5 MG/3ML) 0.083% nebulizer solution Take 3 mLs (2.5 mg total) by nebulization every 6 (six) hours as needed for wheezing or shortness of breath. 360 mL 6   aspirin  81 MG tablet Take 243 mg by mouth daily. Takes 4 81mg  aspirin  daily.     atorvastatin  (LIPITOR) 40 MG tablet Take 1 tablet by mouth once daily 90 tablet 3   betamethasone  valerate ointment (VALISONE ) 0.1 %  Apply 1 Application topically 2 (two) times daily. For up to 10 days. 30 g 0   blood glucose meter kit and supplies KIT Dispense based on patient and insurance preference. Use up to four times daily as directed. (FOR ICD-9 250.00, 250.01). 1 each 0   Cholecalciferol (VITAMIN D3) 1.25 MG (50000 UT) CAPS TAKE 1 CAPSULE BY MOUTH  ONCE A WEEK 12 capsule 0   clotrimazole  (LOTRIMIN ) 1 % cream Apply 1 Application topically 2 (two) times daily. For 7 days 28 g 0   Continuous Blood Gluc Sensor (FREESTYLE LIBRE 3 SENSOR) MISC 1 each by  Does not apply route every 14 (fourteen) days. Place 1 sensor on the skin every 14 days. Use to check glucose continuously 2 each 5   cyclobenzaprine  (FLEXERIL ) 10 MG tablet Take 1 tablet (10 mg total) by mouth at bedtime as needed for muscle spasms. 15 tablet 0   DULoxetine  (CYMBALTA ) 60 MG capsule Take 1 capsule (60 mg total) by mouth daily. 90 capsule 1   fluticasone -salmeterol (ADVAIR) 250-50 MCG/ACT AEPB INHALE 1 DOSE BY MOUTH EVERY 12 HOURS 60 each 0   furosemide  (LASIX ) 40 MG tablet TAKE 1 TABLET BY MOUTH ONCE DAILY AS NEEDED 90 tablet 1   gabapentin  (NEURONTIN ) 300 MG capsule TAKE 1 CAPSULE BY MOUTH THREE TIMES DAILY 270 capsule 0   ibuprofen  (ADVIL ) 800 MG tablet Take 1 tablet (800 mg total) by mouth every 8 (eight) hours as needed. 60 tablet 0   metFORMIN  (GLUCOPHAGE ) 1000 MG tablet TAKE 1 TABLET BY MOUTH TWICE DAILY WITH MEALS 180 tablet 0   metoprolol  tartrate (LOPRESSOR ) 25 MG tablet Take 1 tablet by mouth twice daily 180 tablet 3   ondansetron  (ZOFRAN ) 8 MG tablet Take 1 tablet (8 mg total) by mouth every 8 (eight) hours as needed for nausea or vomiting. 12 tablet 0   pantoprazole  (PROTONIX ) 40 MG tablet Take 1 tablet (40 mg total) by mouth daily. 90 tablet 1   potassium chloride  SA (KLOR-CON ) 20 MEQ tablet Take 1 tablet (20 mEq total) by mouth daily. (Patient taking differently: Take 20 mEq by mouth once a week.) 90 tablet 0   Semaglutide , 2 MG/DOSE, (OZEMPIC , 2 MG/DOSE,) 8 MG/3ML SOPN INJECT 2 MG SUBCUTANEOUSLY  ONCE A WEEK 9 mL 1   oxyCODONE -acetaminophen  (PERCOCET/ROXICET) 5-325 MG tablet Take 0.5-1 tablets by mouth daily as needed for severe pain (pain score 7-10). 30 tablet 0   tamsulosin  (FLOMAX ) 0.4 MG CAPS capsule Take 1 capsule by mouth once daily 90 capsule 3   oxyCODONE -acetaminophen  (PERCOCET/ROXICET) 5-325 MG tablet Take 0.5-1 tablets by mouth daily as needed for severe pain (pain score 7-10). (Patient not taking: Reported on 04/02/2024) 30 tablet 0    oxyCODONE -acetaminophen  (PERCOCET/ROXICET) 5-325 MG tablet Take 0.5-1 tablets by mouth daily as needed for severe pain (pain score 7-10). (Patient not taking: Reported on 04/02/2024) 30 tablet 0   No facility-administered medications prior to visit.     Per HPI unless specifically indicated in ROS section below Review of Systems  Constitutional:  Negative for fatigue and fever.  HENT:  Negative for ear pain.   Eyes:  Negative for pain.  Respiratory:  Negative for cough and shortness of breath.   Cardiovascular:  Negative for chest pain, palpitations and leg swelling.  Gastrointestinal:  Negative for abdominal pain.  Genitourinary:  Negative for dysuria.  Musculoskeletal:  Positive for arthralgias, back pain and neck pain.  Neurological:  Negative for syncope, light-headedness and headaches.  Psychiatric/Behavioral:  Negative for  dysphoric mood.    Objective:  BP 118/82   Pulse 64   Temp 98.3 F (36.8 C) (Oral)   Resp 18   Ht 5' 7 (1.702 m)   Wt 283 lb 6 oz (128.5 kg)   SpO2 96%   BMI 44.38 kg/m   Wt Readings from Last 3 Encounters:  04/02/24 283 lb 6 oz (128.5 kg)  02/15/24 286 lb 2 oz (129.8 kg)  01/24/24 281 lb 2 oz (127.5 kg)      Physical Exam Constitutional:      General: He is not in acute distress.    Appearance: Normal appearance. He is well-developed. He is obese. He is not ill-appearing or toxic-appearing.  HENT:     Head: Normocephalic and atraumatic.     Right Ear: Hearing, tympanic membrane, ear canal and external ear normal.     Left Ear: Hearing, tympanic membrane, ear canal and external ear normal.     Nose: Nose normal.     Mouth/Throat:     Pharynx: Uvula midline.  Eyes:     General: Lids are normal. Lids are everted, no foreign bodies appreciated.     Conjunctiva/sclera: Conjunctivae normal.     Pupils: Pupils are equal, round, and reactive to light.  Neck:     Thyroid : No thyroid  mass or thyromegaly.     Vascular: No carotid bruit.     Trachea:  Trachea and phonation normal.  Cardiovascular:     Rate and Rhythm: Normal rate and regular rhythm.     Pulses: Normal pulses.     Heart sounds: S1 normal and S2 normal. No murmur heard.    No gallop.  Pulmonary:     Breath sounds: Normal breath sounds. No wheezing, rhonchi or rales.  Abdominal:     General: Bowel sounds are normal.     Palpations: Abdomen is soft.     Tenderness: There is no abdominal tenderness. There is no guarding or rebound.     Hernia: No hernia is present.  Musculoskeletal:     Cervical back: Normal range of motion and neck supple.  Lymphadenopathy:     Cervical: No cervical adenopathy.  Skin:    General: Skin is warm and dry.     Findings: No rash.  Neurological:     Mental Status: He is alert.     Cranial Nerves: No cranial nerve deficit.     Sensory: No sensory deficit.     Gait: Gait normal.     Deep Tendon Reflexes: Reflexes are normal and symmetric.  Psychiatric:        Speech: Speech normal.        Behavior: Behavior normal.        Judgment: Judgment normal.       Results for orders placed or performed in visit on 02/15/24  HM DIABETES FOOT EXAM   Collection Time: 02/15/24 12:00 AM  Result Value Ref Range   HM Diabetic Foot Exam done   Microalbumin / creatinine urine ratio   Collection Time: 02/15/24  9:06 AM  Result Value Ref Range   Microalb, Ur 2.1 (H) 0.0 - 1.9 mg/dL   Creatinine,U 788.5 mg/dL   Microalb Creat Ratio 10.1 0.0 - 30.0 mg/g  Comprehensive metabolic panel with GFR   Collection Time: 02/15/24  9:06 AM  Result Value Ref Range   Sodium 142 135 - 145 mEq/L   Potassium 3.9 3.5 - 5.1 mEq/L   Chloride 102 96 - 112 mEq/L   CO2  33 (H) 19 - 32 mEq/L   Glucose, Bld 111 (H) 70 - 99 mg/dL   BUN 15 6 - 23 mg/dL   Creatinine, Ser 9.12 0.40 - 1.50 mg/dL   Total Bilirubin 0.7 0.2 - 1.2 mg/dL   Alkaline Phosphatase 51 39 - 117 U/L   AST 15 0 - 37 U/L   ALT 13 0 - 53 U/L   Total Protein 7.0 6.0 - 8.3 g/dL   Albumin 4.3 3.5 - 5.2  g/dL   GFR 09.60 >39.99 mL/min   Calcium  9.3 8.4 - 10.5 mg/dL  Brain natriuretic peptide   Collection Time: 02/15/24  9:06 AM  Result Value Ref Range   Pro B Natriuretic peptide (BNP) 443.0 (H) 0.0 - 100.0 pg/mL     COVID 19 screen:  No recent travel or known exposure to COVID19 The patient denies respiratory symptoms of COVID 19 at this time. The importance of social distancing was discussed today.   Assessment and Plan The patient's preventative maintenance and recommended screening tests for an annual wellness exam were reviewed in full today. Brought up to date unless services declined.  Counselled on the importance of diet, exercise, and its role in overall health and mortality. The patient's FH and SH was reviewed, including their home life, tobacco status, and drug and alcohol status.   Vaccines: Uptodate with PNA,  Due for tdap and shingrix... he thinks he may have had td in 2018  Refused flu vaccine.. had SE to vaccine in past. Prostate Cancer Screen:  Lab Results  Component Value Date   PSA 0.69 01/09/2023   PSA 0.22 01/05/2022   PSA 0.24 10/19/2020  Colon Cancer Screen:  Cologuard 10/2020, 02/28/2024 negative      Smoking Status: former, quit 4 years ago.. referred to lung cancer screening program. ETOH/ drug use: none/none  Hep C:  negative   Problem List Items Addressed This Visit     BMI 40.0-44.9, adult (HCC)   He has continued to have weight loss on Ozempic  at 2 mg weekly.   Encouraged exercise, weight loss, healthy eating habits.       BPH (benign prostatic hyperplasia) (Chronic)    Chronic, worsened symptoms nocturia... increase flomax  to 0.8 mg daily      Relevant Medications   tamsulosin  (FLOMAX ) 0.4 MG CAPS capsule   Chronic pain syndrome   Indication for chronic opioid:  chronic low back pain.. not currently an issue today, but having shoulder and hip pain Medication and dose:  oxycodone  5/325 mg  0.5 to 1 tablet daily. # pills per  THREE  month: 30 Last UDS date: 11/09/2023 Opioid Treatment Agreement signed (Y/N):  Y 11/09/23  PDMP reviewed during this encounter.       Relevant Medications   oxyCODONE -acetaminophen  (PERCOCET/ROXICET) 5-325 MG tablet   COPD (chronic obstructive pulmonary disease) (HCC) (Chronic)    Chronic, stable  Continue albuterol  for rescue and  Advair  BID       Depression, major, single episode, severe (HCC) (Chronic)   Chronic, moderate control  Seeing psychologist. No medication.         Diabetic neuropathy (HCC) (Chronic)   Stable, tolerable control on gabapentin       HFrEF (heart failure with reduced ejection fraction) (HCC) (Chronic)   Chronic, followed by Dr. Wadie cardiology Last ECHO 06/2022  Euvolemic in office today using furosemide  40 mg p.o. daily as needed      Hyperlipidemia associated with type 2 diabetes mellitus (HCC) (Chronic)  Due for re-eval  Atorvastatin  40 mg daily      Hypertension associated with diabetes (HCC) (Chronic)   Stable  chronic.      Metoprolol  25 mg twice daily      Obesity hypoventilation syndrome (HCC) (Chronic)   Working on weight loss with Ozempic .  On oxygen  with ambulation and sleep.      Spinal stenosis of lumbar region   Relevant Medications   oxyCODONE -acetaminophen  (PERCOCET/ROXICET) 5-325 MG tablet   Thoracic ascending aortic aneurysm   2D echo performed 07/13/2022 revealing ascending thoracic aorta measuring 45 mm.   Followed By Cardiology. Repeat ECHO stable 12/2023      Type 2 diabetes, controlled, with peripheral neuropathy (HCC) (Chronic)   Lab Results  Component Value Date   HGBA1C 6.4 (A) 08/20/2023   Due for  A1C . Uptodate with eye exam, urine microalbumin and foot exam.         Relevant Orders   Lipid panel   Hemoglobin A1c   Comprehensive metabolic panel with GFR   Vitamin D  deficiency (Chronic)   Due for re-eval.      Relevant Orders   VITAMIN D  25 Hydroxy (Vit-D Deficiency, Fractures)    Other Visit Diagnoses       Routine general medical examination at a health care facility    -  Primary     Diabetes mellitus treated with oral medication (HCC)         Prostate cancer screening       Relevant Orders   PSA, Medicare     Left hip pain       Relevant Medications   oxyCODONE -acetaminophen  (PERCOCET/ROXICET) 5-325 MG tablet          Greig Ring, MD

## 2024-04-02 NOTE — Assessment & Plan Note (Signed)
 Chronic, moderate control  Seeing psychologist. No medication.

## 2024-04-02 NOTE — Assessment & Plan Note (Signed)
 Chronic, followed by Dr. Wadie cardiology Last ECHO 06/2022  Euvolemic in office today using furosemide  40 mg p.o. daily as needed

## 2024-05-19 ENCOUNTER — Telehealth: Payer: Self-pay

## 2024-05-19 NOTE — Telephone Encounter (Signed)
 I called and spoke to pt. Pt is scheduled to see Dr Olena on Monday, 05-26-2024. I could find pt in [facility], but could not find compliance. I asked pt is he was using his machine and he stated he was. I asked pt if he would bring in his sd card and or machine so we can take a look. Pt verbalized understanding. NFN

## 2024-05-26 ENCOUNTER — Ambulatory Visit: Admitting: Pulmonary Disease

## 2024-05-26 ENCOUNTER — Encounter: Payer: Self-pay | Admitting: Pulmonary Disease

## 2024-05-26 VITALS — BP 132/80 | HR 56 | Temp 98.0°F | Ht 68.0 in | Wt 284.8 lb

## 2024-05-26 DIAGNOSIS — E662 Morbid (severe) obesity with alveolar hypoventilation: Secondary | ICD-10-CM

## 2024-05-26 DIAGNOSIS — J9611 Chronic respiratory failure with hypoxia: Secondary | ICD-10-CM | POA: Insufficient documentation

## 2024-05-26 DIAGNOSIS — J9612 Chronic respiratory failure with hypercapnia: Secondary | ICD-10-CM | POA: Diagnosis not present

## 2024-05-26 DIAGNOSIS — J449 Chronic obstructive pulmonary disease, unspecified: Secondary | ICD-10-CM | POA: Diagnosis not present

## 2024-05-26 DIAGNOSIS — Z6841 Body Mass Index (BMI) 40.0 and over, adult: Secondary | ICD-10-CM | POA: Diagnosis not present

## 2024-05-26 DIAGNOSIS — G4733 Obstructive sleep apnea (adult) (pediatric): Secondary | ICD-10-CM

## 2024-05-26 NOTE — Assessment & Plan Note (Addendum)
 Based on my review of his 06/2017 Split Night PSG, his BiLevel requirements were at least 21/16 in NREM sleep, but higher in REM needing a driving pressure of at least 6 cm H2O. Even then, his ongoing 2.5-3.0 LPM O2 requirement at a pressure of 23/17 suggests ongoing hypoventilation and/or obstruction at that time.  He has lost an immense amount of weight since 2019. Repeat CPAP/BiLevel titration as above to determine current pressure requirement and whether he needs bleed-in O2 with PAP therapy.  Rx: For now, continue BiLevel Auto w/ Min EPAP 16, PS 5, Max IPAP 25. Dx: Repeat BiLevel Titration.  DME: [facility] per Dr. Saundra notes. Orders:   Cpap titration; Future

## 2024-05-26 NOTE — Assessment & Plan Note (Addendum)
 On Advair 250-50 + albuterol  PRN.

## 2024-05-26 NOTE — Assessment & Plan Note (Addendum)
 Primarily attributable to OHS. Using 2 LPM oxygen  with exertion and sporadically during sleep (but not with his PAP device). Will perform CPAP/BiLevel Titration and determine if bleed-in oxygen  is required during sleep/BiLevel therapy. Orders:   Cpap titration; Future

## 2024-05-26 NOTE — Progress Notes (Signed)
 "  Established Patient Pulmonology Office Visit   Subjective:  Patient ID: Jonathon Graham, male    DOB: [birth year]   MRN: [id number]  CC:  Chief Complaint  Patient presents with   COPD    Pt is currently on 2L of 02 ON PULSE Pt stated he is currently on CPAP  Currently uses CPAP for times a week due to CPAP drying mouth out  SOB occurs w/ exertion   Sleep Apnea    Jonathon Graham is a 68 y.o. patient and former head of environmental services at [employer] who has a history of chronic hypoxemic and hypercapnic respiratory failure due to OSA and OHS, as well as CAD, HFrecEF, and hypertension who presents for follow up. He was previously seen by Dr. Reggy Salt who is retiring.  He reports he is adherent to BiLevel therapy at present. He did not bring his device today and I do not have a report from his DME either.  He is wearing 2 LPM oxygen  during the daytime with exertion (SpO2 95% on 2 LPM by POC today when at rest). He sometimes uses oxygen  at night when he is not using his PAP device. He does *not* currently use bleed-in O2 with his PAP device and reports not being instructed in the past about this.  On Wegovy , since ~2019/2020 per his report, he says he has lost >100 lbs, though his BMI remains well above 40 as of today. He has never tried Zepbound.  ESS today is 8.  Pulm Questionnaires:     02/01/2017   10:00 AM  Results of the Epworth flowsheet  Sitting and reading 3  Watching TV 3  Sitting, inactive in a public place (e.g. a theatre or a meeting) 3  As a passenger in a car for an hour without a break 3  Lying down to rest in the afternoon when circumstances permit 3  Sitting and talking to someone 3  Sitting quietly after a lunch without alcohol 3  In a car, while stopped for a few minutes in traffic 1  Total score 22     ROS  Current Medications[1]      Objective:  BP 132/80   Pulse (!) 56   Temp 98 F (36.7 C) (Oral)   Ht 5' 8 (1.727 m) Comment: per  patient  Wt 284 lb 12.8 oz (129.2 kg)   SpO2 96% Comment: on 2L of poc  BMI 43.30 kg/m    Physical Exam Constitutional:      Appearance: Normal appearance. He is obese.     Comments: Wearing 2 LPM oxygen  via POC.  HENT:     Head: Normocephalic and atraumatic.     Mouth/Throat:     Mouth: Mucous membranes are moist.     Pharynx: Oropharynx is clear. No oropharyngeal exudate or posterior oropharyngeal erythema.     Comments: MP IV, no tongue scalloping Eyes:     General: No scleral icterus.    Conjunctiva/sclera: Conjunctivae normal.  Cardiovascular:     Heart sounds: Normal heart sounds. No murmur heard.    No friction rub. No gallop.  Pulmonary:     Effort: Pulmonary effort is normal. No respiratory distress.  Musculoskeletal:     Right lower leg: Edema present.     Left lower leg: Edema present.     Comments: Trace to 1+ piting edema bilaterally.  Neurological:     Mental Status: He is alert and oriented to person, place, and time.  Psychiatric:  Mood and Affect: Mood normal.        Behavior: Behavior normal.        Thought Content: Thought content normal.     Diagnostic Review:  RAR:Ojdu metabolic panel Lab Results  Component Value Date   WBC 7.2 01/14/2018   HGB 14.0 01/14/2018   HCT 42.8 01/14/2018   MCV 87.1 01/14/2018   MCH 28.7 03/27/2017   RDW 15.5 01/14/2018   PLT 221.0 01/14/2018   Metabolic Panel : Last metabolic panel Lab Results  Component Value Date   GLUCOSE 111 (H) 02/15/2024   NA 142 02/15/2024   K 3.9 02/15/2024   CL 102 02/15/2024   CO2 33 (H) 02/15/2024   BUN 15 02/15/2024   CREATININE 0.87 02/15/2024   GFR 90.39 02/15/2024   CALCIUM  9.3 02/15/2024   PROT 7.0 02/15/2024   ALBUMIN 4.3 02/15/2024   BILITOT 0.7 02/15/2024   ALKPHOS 51 02/15/2024   AST 15 02/15/2024   ALT 13 02/15/2024   ANIONGAP 8 03/27/2017      Component Value Date/Time   BNP 242.0 (H) 03/24/2017 1618   BNP 65.8 11/28/2016 1714    NPSG 07/14/17: AHI4%  139.7, desat to 55%, SpO2 < 88% for 71.4 mins, SpO2 < 80% for 40.9 mins, T90% 99.5%, 342 lbs.      Assessment & Plan:   Assessment & Plan Chronic respiratory failure with hypoxia and hypercapnia (HCC) Primarily attributable to OHS. Using 2 LPM oxygen  with exertion and sporadically during sleep (but not with his PAP device). Will perform CPAP/BiLevel Titration and determine if bleed-in oxygen  is required during sleep/BiLevel therapy. Orders:   Cpap titration; Future  Obesity hypoventilation syndrome (HCC) Based on my review of his 06/2017 Split Night PSG, his BiLevel requirements were at least 21/16 in NREM sleep, but higher in REM needing a driving pressure of at least 6 cm H2O. Even then, his ongoing 2.5-3.0 LPM O2 requirement at a pressure of 23/17 suggests ongoing hypoventilation and/or obstruction at that time.  He has lost an immense amount of weight since 2019. Repeat CPAP/BiLevel titration as above to determine current pressure requirement and whether he needs bleed-in O2 with PAP therapy.  Rx: For now, continue BiLevel Auto w/ Min EPAP 16, PS 5, Max IPAP 25. Dx: Repeat BiLevel Titration.  DME: [facility] per Dr. Saundra notes. Orders:   Cpap titration; Future  Severe obstructive sleep apnea Rx: BiLevel Auto w/ Min EPAP 16, PS 5, Max IPAP 25. Re-titrate in setting of profound weight loss since his last split night sleep study. Orders:   Cpap titration; Future  Chronic obstructive pulmonary disease, unspecified COPD type (HCC) On Advair 250-50 + albuterol  PRN.    Morbid (severe) obesity due to excess calories [facility]) He has been on Wegovy  since at least 12/2020. It would seem his weight loss has reached a plateau with this medication. He is eligible for Zepbound by virtue of his OSA diagnosis and could immensely benefit from the greater weight loss seen with this medication, so will make switch to this and inform his PCP of change.  Rx: Counseled patient about potentially  greater weight loss with Zepbound. He is eager to try this. I will reach out to his PCP to confirm they are in agreement with this change. Orders:   Cpap titration; Future  Adult BMI 40.0-44.9 kg/sq m (HCC)  Orders:   Cpap titration; Future    No follow-ups on file. Patient will be called by our office with the results of  his sleep study and follow up will be scheduled at that time.  Time spent on day of this encounter (includes time spent face-to-face with the patient as well as time spent the same day as the encounter reviewing existing data and notes, and/or documenting my findings and the plan of care): 43 minutes  Lamar JINNY Dales, MD   ADDENDUM 05/28/2024 12:49 PM   Communicated with Dr. Avelina regarding switch from Wegovy  to Zepbound. They were in agreement. Rx for Zepbound 2.5 mg [place] weekly sent to patient's pharmacy. Prior auth team notified.    [1]  Current Outpatient Medications:    albuterol  (PROAIR  HFA) 108 (90 Base) MCG/ACT inhaler, Inhale 1-2 puffs into the lungs every 4 (four) hours as needed for wheezing or shortness of breath., Disp: 18 each, Rfl: 12   aspirin  81 MG tablet, Take 243 mg by mouth daily. Takes 4 81mg  aspirin  daily., Disp: , Rfl:    atorvastatin  (LIPITOR) 40 MG tablet, Take 1 tablet by mouth once daily, Disp: 90 tablet, Rfl: 3   betamethasone  valerate ointment (VALISONE ) 0.1 %, Apply 1 Application topically 2 (two) times daily. For up to 10 days., Disp: 30 g, Rfl: 0   blood glucose meter kit and supplies KIT, Dispense based on patient and insurance preference. Use up to four times daily as directed. (FOR ICD-9 250.00, 250.01)., Disp: 1 each, Rfl: 0   Cholecalciferol (VITAMIN D3) 1.25 MG (50000 UT) CAPS, TAKE 1 CAPSULE BY MOUTH  ONCE A WEEK, Disp: 12 capsule, Rfl: 0   clotrimazole  (LOTRIMIN ) 1 % cream, Apply 1 Application topically 2 (two) times daily. For 7 days, Disp: 28 g, Rfl: 0   Continuous Blood Gluc Sensor (FREESTYLE LIBRE 3 SENSOR) MISC, 1 each by  Does not apply route every 14 (fourteen) days. Place 1 sensor on the skin every 14 days. Use to check glucose continuously, Disp: 2 each, Rfl: 5   cyclobenzaprine  (FLEXERIL ) 10 MG tablet, Take 1 tablet (10 mg total) by mouth at bedtime as needed for muscle spasms., Disp: 15 tablet, Rfl: 0   DULoxetine  (CYMBALTA ) 60 MG capsule, Take 1 capsule (60 mg total) by mouth daily., Disp: 90 capsule, Rfl: 1   fluticasone -salmeterol (ADVAIR) 250-50 MCG/ACT AEPB, INHALE 1 DOSE BY MOUTH EVERY 12 HOURS, Disp: 60 each, Rfl: 0   furosemide  (LASIX ) 40 MG tablet, TAKE 1 TABLET BY MOUTH ONCE DAILY AS NEEDED, Disp: 90 tablet, Rfl: 1   gabapentin  (NEURONTIN ) 300 MG capsule, TAKE 1 CAPSULE BY MOUTH THREE TIMES DAILY, Disp: 270 capsule, Rfl: 0   ibuprofen  (ADVIL ) 800 MG tablet, Take 1 tablet (800 mg total) by mouth every 8 (eight) hours as needed., Disp: 60 tablet, Rfl: 0   metFORMIN  (GLUCOPHAGE ) 1000 MG tablet, TAKE 1 TABLET BY MOUTH TWICE DAILY WITH MEALS, Disp: 180 tablet, Rfl: 0   metoprolol  tartrate (LOPRESSOR ) 25 MG tablet, Take 1 tablet by mouth twice daily, Disp: 180 tablet, Rfl: 3   ondansetron  (ZOFRAN ) 8 MG tablet, Take 1 tablet (8 mg total) by mouth every 8 (eight) hours as needed for nausea or vomiting., Disp: 12 tablet, Rfl: 0   oxyCODONE -acetaminophen  (PERCOCET/ROXICET) 5-325 MG tablet, Take 0.5-1 tablets by mouth daily as needed for severe pain (pain score 7-10)., Disp: 30 tablet, Rfl: 0   pantoprazole  (PROTONIX ) 40 MG tablet, Take 1 tablet (40 mg total) by mouth daily. (Patient taking differently: Take 40 mg by mouth daily. As needed), Disp: 90 tablet, Rfl: 1   potassium chloride  SA (KLOR-CON ) 20 MEQ tablet, Take  1 tablet (20 mEq total) by mouth daily. (Patient taking differently: Take 20 mEq by mouth once a week.), Disp: 90 tablet, Rfl: 0   Semaglutide , 2 MG/DOSE, (OZEMPIC , 2 MG/DOSE,) 8 MG/3ML SOPN, INJECT 2 MG SUBCUTANEOUSLY  ONCE A WEEK, Disp: 9 mL, Rfl: 1   tamsulosin  (FLOMAX ) 0.4 MG CAPS capsule, Take 2  capsules (0.8 mg total) by mouth daily., Disp: 90 capsule, Rfl: 3   albuterol  (PROVENTIL ) (2.5 MG/3ML) 0.083% nebulizer solution, Take 3 mLs (2.5 mg total) by nebulization every 6 (six) hours as needed for wheezing or shortness of breath. (Patient not taking: Reported on 05/26/2024), Disp: 360 mL, Rfl: 6  "

## 2024-05-26 NOTE — Assessment & Plan Note (Addendum)
 Rx: BiLevel Auto w/ Min EPAP 16, PS 5, Max IPAP 25. Re-titrate in setting of profound weight loss since his last split night sleep study. Orders:   Cpap titration; Future

## 2024-05-28 ENCOUNTER — Other Ambulatory Visit (HOSPITAL_COMMUNITY): Payer: Self-pay

## 2024-05-28 ENCOUNTER — Telehealth: Payer: Self-pay

## 2024-05-28 MED ORDER — ZEPBOUND 2.5 MG/0.5ML ~~LOC~~ SOAJ: 2.5000 mg | SUBCUTANEOUS | 0 refills | Status: DC

## 2024-05-28 NOTE — Telephone Encounter (Signed)
*  Pulm  Pharmacy Patient Advocate Encounter   Received notification from Pt Calls Messages that prior authorization for Zepbound 2.5mg  pen is required/requested.   Insurance verification completed.   The patient is insured through [facility].   Per test claim: PA required; PA submitted to above mentioned insurance via Latent Key/confirmation #/EOC [id number] Status is pending

## 2024-05-28 NOTE — Telephone Encounter (Signed)
 Pharmacy Patient Advocate Encounter  Received notification from [facility] that Prior Authorization for Zepbound 2.5mg  pen has been APPROVED from 05/28/2024 to 05/28/2025. Ran test claim, Copay is $0.00. This test claim was processed through [facility]- copay amounts may vary at other pharmacies due to pharmacy/plan contracts, or as the patient moves through the different stages of their insurance plan.

## 2024-05-28 NOTE — Addendum Note (Signed)
 Addended by: OLENA LAMAR PARAS on: 05/28/2024 12:50 PM   Modules accepted: Orders

## 2024-05-30 NOTE — Telephone Encounter (Signed)
 Pt is aware and will pick up rx at pharmacy. NFN

## 2024-06-11 ENCOUNTER — Ambulatory Visit: Payer: Self-pay | Admitting: Cardiovascular Disease

## 2024-06-11 ENCOUNTER — Ambulatory Visit (HOSPITAL_COMMUNITY)
Admission: RE | Admit: 2024-06-11 | Discharge: 2024-06-11 | Disposition: A | Discharge status: Home / Self Care | Source: Ambulatory Visit | Attending: Cardiology | Admitting: Cardiology

## 2024-06-11 DIAGNOSIS — I152 Hypertension secondary to endocrine disorders: Secondary | ICD-10-CM | POA: Diagnosis present

## 2024-06-11 DIAGNOSIS — I5020 Unspecified systolic (congestive) heart failure: Secondary | ICD-10-CM | POA: Diagnosis present

## 2024-06-11 DIAGNOSIS — I7121 Aneurysm of the ascending aorta, without rupture: Secondary | ICD-10-CM | POA: Insufficient documentation

## 2024-06-11 DIAGNOSIS — E1159 Type 2 diabetes mellitus with other circulatory complications: Secondary | ICD-10-CM | POA: Diagnosis present

## 2024-06-11 DIAGNOSIS — E785 Hyperlipidemia, unspecified: Secondary | ICD-10-CM | POA: Insufficient documentation

## 2024-06-11 DIAGNOSIS — E1169 Type 2 diabetes mellitus with other specified complication: Secondary | ICD-10-CM | POA: Diagnosis present

## 2024-06-11 LAB — ECHOCARDIOGRAM COMPLETE
Area-P 1/2: 2.72 cm2
Calc EF: 54.2 %
S' Lateral: 4.3 cm
Single Plane A2C EF: 55.3 %
Single Plane A4C EF: 55.8 %

## 2024-06-13 ENCOUNTER — Other Ambulatory Visit: Payer: Self-pay | Admitting: Family Medicine

## 2024-06-13 DIAGNOSIS — N401 Enlarged prostate with lower urinary tract symptoms: Secondary | ICD-10-CM

## 2024-06-19 ENCOUNTER — Other Ambulatory Visit: Payer: Self-pay | Admitting: Family Medicine

## 2024-06-19 ENCOUNTER — Other Ambulatory Visit: Payer: Self-pay | Admitting: Family

## 2024-06-19 DIAGNOSIS — N401 Enlarged prostate with lower urinary tract symptoms: Secondary | ICD-10-CM

## 2024-06-19 DIAGNOSIS — E119 Type 2 diabetes mellitus without complications: Secondary | ICD-10-CM

## 2024-06-19 DIAGNOSIS — E1142 Type 2 diabetes mellitus with diabetic polyneuropathy: Secondary | ICD-10-CM

## 2024-06-19 NOTE — Telephone Encounter (Signed)
 Please call and schedule fasting lab appointment.  He was suppose to come back in December for labs.  Future orders in Epic.

## 2024-06-19 NOTE — Telephone Encounter (Signed)
 Last office visit 04/02/2024 for CPE.  Last refilled 11/17/2023 for #[id number] with no refills. Next Appt:  No future appointments with PCP.

## 2024-06-26 ENCOUNTER — Other Ambulatory Visit: Payer: Self-pay | Admitting: Family Medicine

## 2024-06-26 DIAGNOSIS — N401 Enlarged prostate with lower urinary tract symptoms: Secondary | ICD-10-CM

## 2024-06-28 ENCOUNTER — Other Ambulatory Visit: Payer: Self-pay | Admitting: Pulmonary Disease

## 2024-06-28 DIAGNOSIS — G4733 Obstructive sleep apnea (adult) (pediatric): Secondary | ICD-10-CM

## 2024-06-28 DIAGNOSIS — Z6841 Body Mass Index (BMI) 40.0 and over, adult: Secondary | ICD-10-CM

## 2024-06-30 ENCOUNTER — Ambulatory Visit: Admitting: Cardiovascular Disease

## 2024-07-01 ENCOUNTER — Ambulatory Visit: Admitting: Cardiovascular Disease

## 2024-07-01 NOTE — Telephone Encounter (Signed)
 Dr Olena, do you want him to continue on this dose or increase to 5 mg? Pt had 2.5 mg filled 05/30/24.

## 2024-07-03 ENCOUNTER — Telehealth: Payer: Self-pay | Admitting: *Deleted

## 2024-07-03 NOTE — Telephone Encounter (Signed)
 Jonathon Graham PARAS, MD to Me     07/01/24  9:54 PM Hi. Can you please reach out to the patient and clarify if he tolerated the 2.5 mg dose well? Any significant nausea, vomiting, or other gastrointestinal upset? If not, will rx 5 mg dose instead. Thanks  I called and spoke with the pt. He states that he is not having nausea, vomiting, GI upset. He wants to proceed with 5 mg dose Zepbound .

## 2024-07-03 NOTE — Telephone Encounter (Signed)
 Received CMN from [facility] for CPAP supplies.  Placed in CMN folder for signature.

## 2024-07-04 ENCOUNTER — Other Ambulatory Visit: Payer: Self-pay | Admitting: Family Medicine

## 2024-07-04 DIAGNOSIS — N401 Enlarged prostate with lower urinary tract symptoms: Secondary | ICD-10-CM

## 2024-07-31 ENCOUNTER — Ambulatory Visit (HOSPITAL_BASED_OUTPATIENT_CLINIC_OR_DEPARTMENT_OTHER): Admitting: Pulmonary Disease

## 2024-08-05 ENCOUNTER — Ambulatory Visit: Admitting: Cardiovascular Disease

## 2024-09-26 ENCOUNTER — Ambulatory Visit
